# Patient Record
Sex: Male | Born: 1960 | Race: Black or African American | Hispanic: No | Smoking: Former smoker
Health system: Southern US, Community
[De-identification: ages and names within clinical notes are randomized; demographics above are authoritative.]

## PROBLEM LIST (undated history)

## (undated) ENCOUNTER — Ambulatory Visit: Payer: MEDICAID | Source: Home / Self Care

## (undated) DIAGNOSIS — F1010 Alcohol abuse, uncomplicated: Secondary | ICD-10-CM

## (undated) DIAGNOSIS — F1910 Other psychoactive substance abuse, uncomplicated: Secondary | ICD-10-CM

## (undated) DIAGNOSIS — T7840XA Allergy, unspecified, initial encounter: Secondary | ICD-10-CM

## (undated) DIAGNOSIS — M109 Gout, unspecified: Secondary | ICD-10-CM

## (undated) DIAGNOSIS — K219 Gastro-esophageal reflux disease without esophagitis: Secondary | ICD-10-CM

## (undated) DIAGNOSIS — F419 Anxiety disorder, unspecified: Secondary | ICD-10-CM

## (undated) DIAGNOSIS — I10 Essential (primary) hypertension: Secondary | ICD-10-CM

## (undated) DIAGNOSIS — Z87442 Personal history of urinary calculi: Secondary | ICD-10-CM

## (undated) DIAGNOSIS — K859 Acute pancreatitis without necrosis or infection, unspecified: Secondary | ICD-10-CM

## (undated) DIAGNOSIS — T83122A Displacement of urinary stent, initial encounter: Secondary | ICD-10-CM

## (undated) DIAGNOSIS — E785 Hyperlipidemia, unspecified: Secondary | ICD-10-CM

## (undated) DIAGNOSIS — M1990 Unspecified osteoarthritis, unspecified site: Secondary | ICD-10-CM

## (undated) DIAGNOSIS — G4730 Sleep apnea, unspecified: Secondary | ICD-10-CM

## (undated) DIAGNOSIS — R519 Headache, unspecified: Secondary | ICD-10-CM

## (undated) DIAGNOSIS — N200 Calculus of kidney: Secondary | ICD-10-CM

## (undated) DIAGNOSIS — K5730 Diverticulosis of large intestine without perforation or abscess without bleeding: Secondary | ICD-10-CM

## (undated) HISTORY — DX: Gout, unspecified: M10.9

## (undated) HISTORY — DX: Displacement of indwelling ureteral stent, initial encounter: T83.122A

## (undated) HISTORY — DX: Hyperlipidemia, unspecified: E78.5

## (undated) HISTORY — DX: Allergy, unspecified, initial encounter: T78.40XA

## (undated) HISTORY — PX: URETEROSCOPY: SHX842

## (undated) HISTORY — DX: Diverticulosis of large intestine without perforation or abscess without bleeding: K57.30

## (undated) HISTORY — DX: Essential (primary) hypertension: I10

## (undated) HISTORY — DX: Calculus of kidney: N20.0

## (undated) HISTORY — DX: Alcohol abuse, uncomplicated: F10.10

## (undated) HISTORY — DX: Gastro-esophageal reflux disease without esophagitis: K21.9

## (undated) HISTORY — PX: OTHER SURGICAL HISTORY: SHX169

## (undated) HISTORY — DX: Unspecified osteoarthritis, unspecified site: M19.90

## (undated) HISTORY — PX: ANTERIOR CRUCIATE LIGAMENT REPAIR: SHX115

## (undated) HISTORY — DX: Acute pancreatitis without necrosis or infection, unspecified: K85.90

---

## 1988-05-07 DIAGNOSIS — T83122A Displacement of urinary stent, initial encounter: Secondary | ICD-10-CM

## 1988-05-07 HISTORY — DX: Displacement of indwelling ureteral stent, initial encounter: T83.122A

## 2004-08-26 ENCOUNTER — Emergency Department (HOSPITAL_COMMUNITY): Admission: EM | Admit: 2004-08-26 | Discharge: 2004-08-26 | Payer: Self-pay | Admitting: *Deleted

## 2004-08-29 ENCOUNTER — Ambulatory Visit (HOSPITAL_COMMUNITY): Admission: RE | Admit: 2004-08-29 | Discharge: 2004-08-29 | Payer: Self-pay | Admitting: Orthopedic Surgery

## 2004-08-31 ENCOUNTER — Ambulatory Visit (HOSPITAL_COMMUNITY): Admission: RE | Admit: 2004-08-31 | Discharge: 2004-09-01 | Payer: Self-pay | Admitting: Orthopedic Surgery

## 2005-07-16 ENCOUNTER — Ambulatory Visit: Payer: Self-pay | Admitting: Internal Medicine

## 2005-07-31 ENCOUNTER — Ambulatory Visit: Payer: Self-pay | Admitting: Internal Medicine

## 2006-05-07 HISTORY — PX: KNEE SURGERY: SHX244

## 2007-01-20 ENCOUNTER — Ambulatory Visit: Payer: Self-pay | Admitting: Internal Medicine

## 2007-01-20 LAB — CONVERTED CEMR LAB
BUN: 10 mg/dL (ref 6–23)
CO2: 33 meq/L — ABNORMAL HIGH (ref 19–32)
Chloride: 110 meq/L (ref 96–112)
GFR calc Af Amer: 118 mL/min
GFR calc non Af Amer: 97 mL/min
Potassium: 3.9 meq/L (ref 3.5–5.1)

## 2007-01-28 ENCOUNTER — Encounter: Payer: Self-pay | Admitting: Internal Medicine

## 2007-01-28 LAB — CONVERTED CEMR LAB
Catecholamines Tot(E+NE) 24 Hr U: 0.051 mg/24hr
Dopamine 24 Hr Urine: 239 mcg/24hr (ref <500)
Epinephrine 24 Hr Urine: 3 mcg/24hr (ref <20)
Metaneph Total, Ur: 439 ug/24hr (ref 90–690)
Metanephrines, Ur: 127 (ref 26–230)
Norepinephrine 24 Hr Urine: 48 mcg/24hr (ref <80)
Normetanephrine, 24H Ur: 312 (ref 44–540)

## 2007-05-12 ENCOUNTER — Encounter: Payer: Self-pay | Admitting: Internal Medicine

## 2007-05-12 ENCOUNTER — Ambulatory Visit: Payer: Self-pay | Admitting: Internal Medicine

## 2007-05-12 DIAGNOSIS — Z87442 Personal history of urinary calculi: Secondary | ICD-10-CM

## 2007-05-12 DIAGNOSIS — M6530 Trigger finger, unspecified finger: Secondary | ICD-10-CM | POA: Insufficient documentation

## 2007-05-14 ENCOUNTER — Telehealth: Payer: Self-pay | Admitting: Internal Medicine

## 2007-08-07 ENCOUNTER — Ambulatory Visit: Payer: Self-pay | Admitting: Internal Medicine

## 2007-08-08 ENCOUNTER — Telehealth: Payer: Self-pay | Admitting: Internal Medicine

## 2007-08-08 ENCOUNTER — Ambulatory Visit: Payer: Self-pay | Admitting: Internal Medicine

## 2007-08-08 LAB — CONVERTED CEMR LAB
BUN: 6 mg/dL (ref 6–23)
CO2: 28 meq/L (ref 19–32)
Chloride: 106 meq/L (ref 96–112)
Creatinine, Ser: 1 mg/dL (ref 0.4–1.5)
Crystals: NEGATIVE
GFR calc non Af Amer: 86 mL/min
Hemoglobin, Urine: NEGATIVE
Ketones, ur: NEGATIVE mg/dL
Potassium: 3.5 meq/L (ref 3.5–5.1)
Sodium: 141 meq/L (ref 135–145)
Specific Gravity, Urine: 1.015 (ref 1.000–1.03)
Total Protein, Urine: NEGATIVE mg/dL
Urine Glucose: NEGATIVE mg/dL
Urobilinogen, UA: 0.2 (ref 0.0–1.0)
pH: 5.5 (ref 5.0–8.0)

## 2007-08-09 ENCOUNTER — Telehealth: Payer: Self-pay | Admitting: Internal Medicine

## 2007-10-26 ENCOUNTER — Encounter: Payer: Self-pay | Admitting: Internal Medicine

## 2007-10-26 ENCOUNTER — Ambulatory Visit: Payer: Self-pay | Admitting: Internal Medicine

## 2007-10-26 ENCOUNTER — Inpatient Hospital Stay (HOSPITAL_COMMUNITY): Admission: EM | Admit: 2007-10-26 | Discharge: 2007-11-08 | Payer: Self-pay | Admitting: Emergency Medicine

## 2007-10-26 DIAGNOSIS — F1010 Alcohol abuse, uncomplicated: Secondary | ICD-10-CM | POA: Insufficient documentation

## 2007-11-06 ENCOUNTER — Telehealth: Payer: Self-pay | Admitting: Internal Medicine

## 2007-11-10 ENCOUNTER — Telehealth: Payer: Self-pay | Admitting: Internal Medicine

## 2007-11-12 ENCOUNTER — Ambulatory Visit: Payer: Self-pay | Admitting: Internal Medicine

## 2007-11-13 ENCOUNTER — Ambulatory Visit: Payer: Self-pay | Admitting: Internal Medicine

## 2007-11-13 ENCOUNTER — Encounter (INDEPENDENT_AMBULATORY_CARE_PROVIDER_SITE_OTHER): Payer: Self-pay | Admitting: *Deleted

## 2007-11-13 DIAGNOSIS — Z8719 Personal history of other diseases of the digestive system: Secondary | ICD-10-CM | POA: Insufficient documentation

## 2007-11-14 ENCOUNTER — Telehealth: Payer: Self-pay | Admitting: Internal Medicine

## 2007-11-17 ENCOUNTER — Telehealth: Payer: Self-pay | Admitting: Internal Medicine

## 2007-11-18 ENCOUNTER — Telehealth: Payer: Self-pay | Admitting: Internal Medicine

## 2007-12-04 ENCOUNTER — Telehealth: Payer: Self-pay | Admitting: Internal Medicine

## 2008-01-01 ENCOUNTER — Telehealth: Payer: Self-pay | Admitting: Internal Medicine

## 2008-01-14 ENCOUNTER — Ambulatory Visit: Payer: Self-pay | Admitting: Internal Medicine

## 2008-01-15 ENCOUNTER — Ambulatory Visit: Payer: Self-pay | Admitting: Cardiology

## 2008-01-16 ENCOUNTER — Telehealth: Payer: Self-pay | Admitting: Internal Medicine

## 2008-01-26 ENCOUNTER — Telehealth: Payer: Self-pay | Admitting: Internal Medicine

## 2008-04-07 ENCOUNTER — Telehealth (INDEPENDENT_AMBULATORY_CARE_PROVIDER_SITE_OTHER): Payer: Self-pay | Admitting: *Deleted

## 2008-04-12 ENCOUNTER — Encounter: Payer: Self-pay | Admitting: Internal Medicine

## 2008-04-14 ENCOUNTER — Ambulatory Visit: Payer: Self-pay | Admitting: Internal Medicine

## 2008-04-14 DIAGNOSIS — I10 Essential (primary) hypertension: Secondary | ICD-10-CM

## 2008-04-21 ENCOUNTER — Telehealth: Payer: Self-pay | Admitting: Internal Medicine

## 2008-04-26 ENCOUNTER — Ambulatory Visit: Payer: Self-pay | Admitting: Internal Medicine

## 2008-06-24 ENCOUNTER — Telehealth: Payer: Self-pay | Admitting: Internal Medicine

## 2008-07-08 ENCOUNTER — Telehealth: Payer: Self-pay | Admitting: Internal Medicine

## 2008-07-13 ENCOUNTER — Telehealth: Payer: Self-pay | Admitting: Internal Medicine

## 2008-08-10 ENCOUNTER — Ambulatory Visit: Payer: Self-pay | Admitting: Internal Medicine

## 2008-08-10 DIAGNOSIS — K219 Gastro-esophageal reflux disease without esophagitis: Secondary | ICD-10-CM

## 2009-01-19 ENCOUNTER — Encounter (INDEPENDENT_AMBULATORY_CARE_PROVIDER_SITE_OTHER): Payer: Self-pay | Admitting: *Deleted

## 2009-02-18 ENCOUNTER — Ambulatory Visit: Payer: Self-pay | Admitting: Internal Medicine

## 2009-02-22 ENCOUNTER — Telehealth: Payer: Self-pay | Admitting: Internal Medicine

## 2009-03-12 ENCOUNTER — Telehealth (INDEPENDENT_AMBULATORY_CARE_PROVIDER_SITE_OTHER): Payer: Self-pay | Admitting: *Deleted

## 2009-04-18 ENCOUNTER — Telehealth: Payer: Self-pay | Admitting: Internal Medicine

## 2009-04-19 ENCOUNTER — Ambulatory Visit: Payer: Self-pay | Admitting: Internal Medicine

## 2009-04-19 ENCOUNTER — Encounter (INDEPENDENT_AMBULATORY_CARE_PROVIDER_SITE_OTHER): Payer: Self-pay | Admitting: *Deleted

## 2009-08-01 ENCOUNTER — Telehealth: Payer: Self-pay | Admitting: Internal Medicine

## 2009-09-27 ENCOUNTER — Telehealth (INDEPENDENT_AMBULATORY_CARE_PROVIDER_SITE_OTHER): Payer: Self-pay | Admitting: *Deleted

## 2010-06-05 ENCOUNTER — Ambulatory Visit: Admit: 2010-06-05 | Payer: Self-pay | Admitting: Internal Medicine

## 2010-06-06 NOTE — Progress Notes (Signed)
Summary: Beta-prostate supplement  Phone Note Call from Patient Call back at [facility] Phone [phone]   Summary of Call: Pt is thinking about trying a new product, betaprostate, from [facility] store. Is this ok to try given current medications?  Initial call taken by: Lamar Sprinkles, CMA,  August 01, 2009 4:33 PM  Follow-up for Phone Call        per google search this is super potent saw palmeto and should be OK Follow-up by: Jacques Navy MD,  August 01, 2009 6:21 PM  Additional Follow-up for Phone Call Additional follow up Details #1::        Pt informed  Additional Follow-up by: Lamar Sprinkles, CMA,  August 01, 2009 6:25 PM

## 2010-06-06 NOTE — Progress Notes (Signed)
Summary: Schedule recall office visit  Phone Note Outgoing Call Call back at [facility] Phone [phone]   Call placed by: Christie Nottingham CMA Duncan Dull),  Sep 27, 2009 11:07 AM Call placed to: Patient Summary of Call: left message for pt  to call back and schedule recall office visit.  Initial call taken by: Christie Nottingham CMA Duncan Dull),  Sep 27, 2009 11:08 AM  Follow-up for Phone Call        Pt refused to schedule at this time and states he will call back to schedule office visit.  Follow-up by: Christie Nottingham CMA Duncan Dull),  October 05, 2009 10:38 AM

## 2010-06-08 ENCOUNTER — Other Ambulatory Visit: Payer: Self-pay | Admitting: Internal Medicine

## 2010-06-08 ENCOUNTER — Encounter (INDEPENDENT_AMBULATORY_CARE_PROVIDER_SITE_OTHER): Payer: Self-pay | Admitting: *Deleted

## 2010-06-08 ENCOUNTER — Other Ambulatory Visit: Payer: Self-pay

## 2010-06-08 DIAGNOSIS — E785 Hyperlipidemia, unspecified: Secondary | ICD-10-CM

## 2010-06-08 DIAGNOSIS — Z0000 Encounter for general adult medical examination without abnormal findings: Secondary | ICD-10-CM

## 2010-06-08 LAB — LIPID PANEL
Total CHOL/HDL Ratio: 3
Triglycerides: 186 mg/dL — ABNORMAL HIGH (ref 0.0–149.0)
VLDL: 37.2 mg/dL (ref 0.0–40.0)

## 2010-06-08 LAB — CBC WITH DIFFERENTIAL/PLATELET
Basophils Absolute: 0 10*3/uL (ref 0.0–0.1)
Basophils Relative: 0.5 % (ref 0.0–3.0)
Eosinophils Absolute: 0.1 10*3/uL (ref 0.0–0.7)
Eosinophils Relative: 0.9 % (ref 0.0–5.0)
HCT: 40.7 % (ref 39.0–52.0)
Lymphocytes Relative: 33.2 % (ref 12.0–46.0)
Monocytes Absolute: 0.8 10*3/uL (ref 0.1–1.0)
Monocytes Relative: 8.9 % (ref 3.0–12.0)
Neutro Abs: 5.2 10*3/uL (ref 1.4–7.7)

## 2010-06-08 LAB — BASIC METABOLIC PANEL
CO2: 27 mEq/L (ref 19–32)
Calcium: 9.3 mg/dL (ref 8.4–10.5)
Chloride: 101 mEq/L (ref 96–112)
Creatinine, Ser: 1.1 mg/dL (ref 0.4–1.5)
GFR: 91.75 mL/min (ref >60.00)
Glucose, Bld: 112 mg/dL — ABNORMAL HIGH (ref 70–99)
Potassium: 3.8 mEq/L (ref 3.5–5.1)

## 2010-06-08 LAB — URINALYSIS, ROUTINE W REFLEX MICROSCOPIC
Bilirubin Urine: NEGATIVE
Ketones, ur: NEGATIVE
Leukocytes, UA: NEGATIVE
Nitrite: NEGATIVE
Specific Gravity, Urine: >=1.03 (ref 1.000–1.030)
Total Protein, Urine: NEGATIVE
Urine Glucose: NEGATIVE
Urobilinogen, UA: 0.2 (ref 0.0–1.0)

## 2010-06-08 LAB — TSH: TSH: 0.9 u[IU]/mL (ref 0.35–5.50)

## 2010-06-08 LAB — HEPATIC FUNCTION PANEL
Alkaline Phosphatase: 30 U/L — ABNORMAL LOW (ref 39–117)
Bilirubin, Direct: 0.2 mg/dL (ref 0.0–0.3)
Total Bilirubin: 1.1 mg/dL (ref 0.3–1.2)

## 2010-06-08 LAB — LDL CHOLESTEROL, DIRECT: Direct LDL: 143.6 mg/dL

## 2010-06-08 LAB — PSA: PSA: 0.5 ng/mL (ref 0.10–4.00)

## 2010-06-09 ENCOUNTER — Encounter: Payer: Self-pay | Admitting: Internal Medicine

## 2010-06-09 ENCOUNTER — Encounter (INDEPENDENT_AMBULATORY_CARE_PROVIDER_SITE_OTHER): Payer: BC Managed Care – PPO | Admitting: Internal Medicine

## 2010-06-09 DIAGNOSIS — Z0000 Encounter for general adult medical examination without abnormal findings: Secondary | ICD-10-CM

## 2010-06-22 NOTE — Assessment & Plan Note (Signed)
Summary: cpx/#/bcbs/cd   Vital Signs:  Patient profile:   50 year old male Height:      71 inches Weight:      230 pounds BMI:     32.19 O2 Sat:      97 % on Room air Temp:     98.1 degrees F oral Pulse rate:   72 / minute BP sitting:   120 / 88  (left arm) Cuff size:   large  Vitals Entered By: Bill Salinas CMA (June 09, 2010 2:49 PM)  O2 Flow:  Room air CC: cpx/ ab  Vision Screening:      Vision Comments: Eye Exam due   Primary Care Provider:  Illene Regulus MD  CC:  cpx/ ab.  History of Present Illness: Mr. Cumpston presents for an annual physical exam. He has been doing well. His chief complaint is a tendon nodule base of 3rd finger left hand. He is otherwise doing well. Over the past yearhe has gained some weight. He is aware of this and recogizes the importance of working to keep his weight under good control.   In the interval her has had no GI problems: no abdominal pain, food intolerance or change in bowel habit.   Current Medications (verified): 1)  Lotrel 5-20 Mg  Caps (Amlodipine Besy-Benazepril Hcl) .... Take 1 Tablet By Mouth Once A Day 2)  Furosemide 20 Mg  Tabs (Furosemide) .[name].. 1 By Mouth Once Daily 3)  Excedrin Extra Strength 250-250-65 Mg Tabs (Aspirin-Acetaminophen-Caffeine) .... One Tablet By Mouth As Needed 4)  Ranitidine Hcl 150 Mg Caps (Ranitidine Hcl) .[name].. 1 By Mouth Two Times A Day. For Stomach Acid As Needed 5)  Amrix 15 Mg Xr24h-Cap (Cyclobenzaprine Hcl) .... One By Mouth Once Daily As Needed For Scalp Spasms  Allergies (verified): No Known Drug Allergies  Past History:  Past Medical History: Last updated: 01/14/2008 * STENT PLACEMENT IN 1990-ureteral * URETEROSCOPY NEPHROLITHIASIS, HX OF (ICD-V13.01) HYPERTENSION (ICD-401.9) GC medically treated in the past Pancreatitis, hx of Hyperlipidemia Kidney Stones  Past Surgical History: Last updated: 04/14/2008 ureteroscopy and stone retrieval  '90 A2-pulley flexor sheath cyst 4th  finger-excisional biopsy ([facility]) '03  Family History: Last updated: 05/12/2007 Pos- HTN, breast cancer, EtOH abuse Neg - CAD, DM, prostate or colon cancer  Social History: [employer] ground[employer]. Difficult domestic situation: married  '87-seperated '01 2 sons - ''82, '91; 1 daughter '91 Alcohol use-yes His niece is 72 with metastatic breast cancer (Feb '12)  Review of Systems  The patient denies anorexia, fever, weight gain, vision loss, hoarseness, chest pain, dyspnea on exertion, prolonged cough, abdominal pain, melena, hematochezia, severe indigestion/heartburn, muscle weakness, suspicious skin lesions, depression, unusual weight change, enlarged lymph nodes, and angioedema.    Physical Exam  General:  alert, well-developed, well-nourished, well-hydrated, normal appearance, and healthy-appearing.   Head:  Normocephalic and atraumatic without obvious abnormalities. No apparent alopecia or balding. Eyes:  vision grossly intact, pupils equal, pupils round, corneas and lenses clear, no injection, no optic disk abnormalities, and no retinal abnormalitiies.   Ears:  External ear exam shows no significant lesions or deformities.  Otoscopic examination reveals clear canals, tympanic membranes are intact bilaterally without bulging, retraction, inflammation or discharge. Hearing is grossly normal bilaterally. Nose:  no external deformity, no external erythema, and no nasal discharge.   Mouth:  Oral mucosa and oropharynx without lesions or exudates.  Teeth in good repair. Neck:  supple, full ROM, no thyromegaly, and no carotid bruits.   Chest Wall:  No deformities, masses, tenderness or gynecomastia noted. Lungs:  Normal respiratory effort, chest expands symmetrically. Lungs are clear to auscultation, no crackles or wheezes. Heart:  Normal rate and regular rhythm. S1 and S2 normal without gallop, murmur, click, rub or other extra sounds. Abdomen:  soft, non-tender, normal bowel sounds, no  guarding, no rigidity, and no hepatomegaly.   Prostate:  deferred to PSA Msk:  normal ROM, no joint tenderness, no joint swelling, no joint warmth, and no joint instability.   Pulses:  2+ radial and PT ulses Extremities:  No clubbing, cyanosis, edema, or deformity noted with normal full range of motion of all joints.   Neurologic:  alert & oriented X3, cranial nerves II-XII intact, gait normal, and DTRs symmetrical and normal.   Skin:  turgor normal, color normal, no rashes, no suspicious lesions, and no ulcerations.   Cervical Nodes:  no anterior cervical adenopathy and no posterior cervical adenopathy.   Inguinal Nodes:  no R inguinal adenopathy and no L inguinal adenopathy.   Psych:  Oriented X3, memory intact for recent and remote, normally interactive, and good eye contact.     Impression & Recommendations:  Problem # 1:  GERD (ICD-530.81) Well controlled on H2 blocker therapy.  His updated medication list for this problem includes:    Ranitidine Hcl 150 Mg Caps (Ranitidine hcl) .[name]... 1 by mouth two times a day. for stomach acid as needed  Problem # 2:  HYPERTENSION (ICD-401.9) His updated medication list for this problem includes:    Lotrel 5-20 Mg Caps (Amlodipine besy-benazepril hcl) .[name]... Take 1 tablet by mouth once a day    Furosemide 20 Mg Tabs (Furosemide) .[name]... 1 by mouth once daily  BP today: 120/88 Prior BP: 134/84 (04/19/2009)  Good control on present medications. Lab values - normal: no electrolyte imabalance, renal function normal.  Problem # 3:  Preventive Health Care (ICD-V70.0) Interval history has been unremarkable. Physical exam is normal. Lab results are within normal limits except for LDL cholesterol that is above goal of 130 or less and minimal elevation in blood sugar. Weight management - discussed this as a problem that wil need attention: smart food choices ( the local calorie choice); PORTION SIZE CONTROL - one plate, no seconds, snack in a bowel and never  form the can or bag; regular aerobic exercise.  Target weight - 200 lbs; goal is to loose 1-2 lbs per month = 15 - 30 month project!!. Prostate cancer screening - PSA normal. Will need colorectal cancer screenings at age 34. Immunizations - last tetnus on record is '96, due for tetnus.  In summary - a very nice man who appears to be medically stable. He is counseled to reduce the fat in his diet to bring his LDL cholesterol to goal and to try to eliminate sugar from his diet, especially sugared beverages. He will return as needed or in 1 year.   Complete Medication List: 1)  Lotrel 5-20 Mg Caps (Amlodipine besy-benazepril hcl) .... Take 1 tablet by mouth once a day 2)  Furosemide 20 Mg Tabs (Furosemide) .[name].. 1 by mouth once daily 3)  Excedrin Extra Strength 250-250-65 Mg Tabs (Aspirin-acetaminophen-caffeine) .... One tablet by mouth as needed 4)  Ranitidine Hcl 150 Mg Caps (Ranitidine hcl) .[name].. 1 by mouth two times a day. for stomach acid as needed 5)  Amrix 15 Mg Xr24h-cap (Cyclobenzaprine hcl) .... One by mouth once daily as needed for scalp spasms   Patient: [name] Note: All result statuses are  Final unless otherwise noted.  Tests: (1) Lipid Panel (LIPID)   Cholesterol          [H]  219 mg/dL                   1-610     ATP III Classification            Desirable:  < 200 mg/dL                    Borderline High:  200 - 239 mg/dL               High:  > = 240 mg/dL   Triglycerides        [H]  186.0 mg/dL                 9.6-045.4     Normal:  <150 mg/dL     Borderline High:  098 - 199 mg/dL   HDL                       11.91 mg/dL                 >47.82   VLDL Cholesterol          37.2 mg/dL                  9.5-62.1  CHO/HDL Ratio:  CHD Risk                             3                    Men          Women     1/2 Average Risk     3.4          3.3     Average Risk          5.0          4.4     2X Average Risk          9.6          7.1     3X Average Risk          15.0           11.0                           Tests: (2) BMP (METABOL)   Sodium                    136 mEq/L                   135-145   Potassium                 3.8 mEq/L                   3.5-5.1   Chloride                  101 mEq/L                   96-112   Carbon Dioxide            27 mEq/L                    19-32  Glucose              [H]  112 mg/dL                   47-82   BUN                       17 mg/dL                    9-56   Creatinine                1.1 mg/dL                   2.1-3.0   Calcium                   9.3 mg/dL                   8.6-57.8   GFR                       91.75 mL/min                >60.00  Tests: (3) CBC Platelet w/Diff (CBCD)   White Cell Count          9.2 K/uL                    4.5-10.5   Red Cell Count            4.33 Mil/uL                 4.22-5.81   Hemoglobin                13.9 g/dL                   46.9-62.9   Hematocrit                40.7 %                      39.0-52.0   MCV                       94.0 fl                     78.0-100.0   MCHC                      34.2 g/dL                   52.8-41.3   RDW                       13.4 %                      11.5-14.6   Platelet Count            239.0 K/uL                  150.0-400.0   Neutrophil %              56.5 %                      43.0-77.0   Lymphocyte %  33.2 %                      12.0-46.0   Monocyte %                8.9 %                       3.0-12.0   Eosinophils%              0.9 %                       0.0-5.0   Basophils %               0.5 %                       0.0-3.0   Neutrophill Absolute      5.2 K/uL                    1.4-7.7   Lymphocyte Absolute       3.1 K/uL                    0.7-4.0   Monocyte Absolute         0.8 K/uL                    0.1-1.0  Eosinophils, Absolute                             0.1 K/uL                    0.0-0.7   Basophils Absolute        0.0 K/uL                    0.0-0.1  Tests: (4) Hepatic/Liver Function Panel (HEPATIC)    Total Bilirubin           1.1 mg/dL                   9.8-1.1   Direct Bilirubin          0.2 mg/dL                   9.1-4.7   Alkaline Phosphatase [L]  30 U/L                      39-117   AST                       29 U/L                      0-37   ALT                       29 U/L                      0-53   Total Protein             7.6 g/dL                    8.2-9.5   Albumin                   4.6  g/dL                    0.4-5.4  Tests: (5) TSH (TSH)   FastTSH                   0.90 uIU/mL                 0.35-5.50  Tests: (6) Prostate Specific Antigen (PSA)   PSA-Hyb                   0.50 ng/mL                  0.10-4.00  Tests: (7) UDip w/Micro (URINE)   Color                     YELLOW       RANGE:  Yellow;Lt. Yellow   Clarity                   CLEAR                       Clear   Specific Gravity          >=1.030                     1.000 - 1.030   Urine Ph                  5.0                         5.0-8.0   Protein                   NEGATIVE                    Negative   Urine Glucose             NEGATIVE                    Negative   Ketones                   NEGATIVE                    Negative   Urine Bilirubin           NEGATIVE                    Negative   Blood                     MODERATE                    Negative   Urobilinogen              0.2                         0.0 - 1.0   Leukocyte Esterace        NEGATIVE                    Negative   Nitrite                   NEGATIVE  Negative   Urine Epith               Rare(0-4/hpf)               Rare(0-4/hpf)   Uric Acid Crystals        Presence of                 None  Tests: (8) Cholesterol LDL - Direct (DIRLDL)  Cholesterol LDL - Direct                             143.6 mg/dL     Optimal:  <627 mg/dL     Near or Above Optimal:  100-129 mg/dL     Borderline High:  035-009 mg/dL     High:  381-829 mg/dL     Very High:  >937 mg/dLPrescriptions: RANITIDINE HCL 150 MG CAPS (RANITIDINE HCL)  1 by mouth two times a day. For stomach acid as needed  #60 Capsule x 12   Entered and Authorized by:   Jacques Navy MD   Signed by:   Jacques Navy MD on 06/09/2010   Method used:   Electronically to        [facility]. [phone]* (retail)       [address]       [place], [place]  [address]       Ph: [phone] or [phone]       Fax: [phone]   RxID:   [id number] FUROSEMIDE 20 MG  TABS (FUROSEMIDE) 1 by mouth once daily  #30 Tablet x 12   Entered and Authorized by:   Jacques Navy MD   Signed by:   Jacques Navy MD on 06/09/2010   Method used:   [facility] to        [facility] [phone]* (retail)       [address] [place]  [address]       Ph: [phone] or [phone]       Fax: [phone]   RxID:   [id number] LOTREL 5-20 MG  CAPS (AMLODIPINE BESY-BENAZEPRIL HCL) Take 1 tablet by mouth once a day  #30 Capsule x 12   Entered and Authorized by:   Jacques Navy MD   Signed by:   Jacques Navy MD on 06/09/2010   Method used:   [facility] to        [facility]. [phone]* (retail)       [address], [place]  [address]       Ph: [phone] or [phone]       Fax: [phone]   RxID:   [id number]    Orders Added: 1)  Est. Patient 40-64 years [phone]

## 2010-08-26 ENCOUNTER — Emergency Department (HOSPITAL_COMMUNITY)
Admission: EM | Admit: 2010-08-26 | Discharge: 2010-08-27 | Disposition: A | Discharge status: Home / Self Care | Payer: BC Managed Care – PPO | Attending: Emergency Medicine | Admitting: Emergency Medicine

## 2010-08-26 DIAGNOSIS — I10 Essential (primary) hypertension: Secondary | ICD-10-CM | POA: Insufficient documentation

## 2010-08-26 DIAGNOSIS — Z87898 Personal history of other specified conditions: Secondary | ICD-10-CM | POA: Insufficient documentation

## 2010-08-26 DIAGNOSIS — Z87442 Personal history of urinary calculi: Secondary | ICD-10-CM | POA: Insufficient documentation

## 2010-08-26 DIAGNOSIS — R109 Unspecified abdominal pain: Secondary | ICD-10-CM | POA: Insufficient documentation

## 2010-08-26 DIAGNOSIS — N200 Calculus of kidney: Secondary | ICD-10-CM | POA: Insufficient documentation

## 2010-08-26 DIAGNOSIS — R112 Nausea with vomiting, unspecified: Secondary | ICD-10-CM | POA: Insufficient documentation

## 2010-08-26 LAB — URINALYSIS, ROUTINE W REFLEX MICROSCOPIC
Bilirubin Urine: NEGATIVE
Glucose, UA: NEGATIVE mg/dL
Ketones, ur: NEGATIVE mg/dL
Leukocytes, UA: NEGATIVE
pH: 5.5 (ref 5.0–8.0)

## 2010-08-26 LAB — URINE MICROSCOPIC-ADD ON

## 2010-08-27 ENCOUNTER — Emergency Department (HOSPITAL_COMMUNITY): Payer: BC Managed Care – PPO

## 2010-09-19 NOTE — Assessment & Plan Note (Signed)
[facility]                           PRIMARY CARE OFFICE NOTE   CHUKWUKA, FESTA                        MRN:          [id number]  DATE:01/20/2007                            DOB:          12-30-1960    [name] is a 50 year old African-American gentleman who is seen  acutely.  He reports that, during the last week or so, he has had  episodes of headache, light-headedness, and when his blood pressure has  been checked either by nurse at work or at his local drug store, it has  been significantly elevated in the 195/120 range.  The patient does  admit to having headache and flushing with activity.  He has been having  some left chest discomfort, burning in nature, not necessarily exertion  related.  However, on questioning again, he did admit to having some  exertion-related chest pain and flushing.  He, on questioning, did admit  to having a adrenalized-type feeling with a funny feeling in the pit  of his stomach and a flight/fight mode.   The patient's last office visit dated July 31, 2005.  Aside from  today's complaints, was doing well in the interval.  He has been taking  his blood pressure medication on a regular basis.   PAST MEDICAL HISTORY:   SURGICAL:  The patient had nephrolithiasis requiring surgical  intervention and ureteroscopy and stent placement in 1990.  No other  surgeries are noted.   MEDICAL:  1. The patient had usual childhood disease.  2. GC treated medically.  3. Hypertension for at least the last 12 years.   CURRENT MEDICATIONS:  Lotrel 5/20 once daily.   HABITS:  The patient quit smoking in 2001.  Alcohol use, the patient  quit drinking in 2001.   FAMILY HISTORY:  Positive for hypertension.  Positive for breast cancer.  Positive for alcohol abuse.  Positive for stroke in second degree  kinship.  History is negative for cardiovascular disease, diabetes,  prostate cancer, or colon cancer.   SOCIAL HISTORY:  The  patient is a high Garment/textile technologist.  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  He has a difficult  domestic situation by his report with the usual amount of __________  and disagreement.   REVIEW OF SYSTEMS:  Negative for any fevers, sweats, chills, or other  constitutional symptoms.  No ophthalm, ENT, respiratory problems.  Cardiovascular and GI as above.   EXAMINATION:  Temperature 99.2, blood pressure 145/92, pulse 78, weight  221.  GENERAL APPEARANCE:  This is a heavyset overweight African-American  gentleman in no acute distress.  CHEST:  The patient is moving air well with no rales, wheezes, or  rhonchi.  CARDIOVASCULAR:  2+ radial pulse.  He had a quiet precordium.  He had a  regular rate and rhythm without murmurs, rubs, or gallops.  ABDOMEN:  Protuberant, soft, with positive bowel sounds.  Minimal  tenderness in the epigastrium and left upper quadrant.   A 12-lead echocardiogram revealed the patient to have a normal sinus  rhythm.  There were no ST-T  wave changes.  No signs of acute injury.  No  signs of old injury.   ASSESSMENT AND PLAN:  1. Cardiovascular.  The patient with atypical symptoms for coronary      artery disease/angina.  In reviewing his chart, he did have a      stress test at [facility] March 18, 2001, which was a negative study.      The patient's EKG, as noted, is unremarkable.  Index of suspicion      for active ischemic heart disease is low.  2. Endocrine.  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  Plan:      A 24 hour urinalysis for catecholamines and metanephrine.  3. Hypertension.  The patient's blood pressure in the office today is      mildly elevated, as it was in March of 2007.  Plan:  Will add Lasix      20 mg to  the patient's regimen.  He will continue with Lotrel 5/20.   SUMMARY:  The patient with paroxysmal elevation in blood pressure with  associated symptoms as noted.  A 24 hour urine study is ordered and  pending.  The patient is carefully instructed to notify me should he  have any progressive symptoms or discomfort.     Rosalyn Gess Norins, MD  Electronically Signed    MEN/MedQ  DD: 01/21/2007  DT: 01/21/2007  Job #: [id number]   cc:   Dolphus Jenny

## 2010-09-22 NOTE — Op Note (Signed)
[name], [name]                 ACCOUNT NO.:  [id number]   MEDICAL RECORD NO.:  [id number]          PATIENT TYPE:  OIB   LOCATION:  2550                         FACILITY:  [facility]   PHYSICIAN:  Doralee Albino. Carola Frost, M.D. DATE OF BIRTH:  [birth year]   DATE OF PROCEDURE:  08/31/2004  DATE OF DISCHARGE:                                 OPERATIVE REPORT   PREOPERATIVE DIAGNOSIS:  Right patellar tendon acute disruption.   POSTOPERATIVE DIAGNOSIS:  Right patellar tendon acute disruption.   PROCEDURE:  Acute repair of right patellar tendon rupture and retinaculum.   SURGEON:  Myrene Galas, M.D.   ASSISTANT:  Cecil Cranker, P.A.   ANESTHESIA:  General.   TOTAL TOURNIQUET TIME:  90 minutes.   ESTIMATED BLOOD LOSS:  70 cc of hematoma.   DRAINS:  None.   DISPOSITION:  To PACU.   CONDITION:  Stable.   BRIEF SUMMARY OF INDICATIONS FOR PROCEDURE:  [name] is a 50 year old black  male who sustained an acute injury to his right knee while playing  basketball.  He was unable to perform a straight leg raise and subsequent  physical examination and x-rays suggested patellar tendon disruption.  After  a discussion of the risks and benefits of surgery including the possibility  of loss of motion, failure of the tendon to heal, infection, and  neurovascular injury, the patient wished to proceed.  On the date of his  initial planned surgery of August 29, 2004, he was not able to undergo  surgery because of problems with scheduling in the main operating room and  the unavailability of rooms.  Consequently, he returned on August 31, 2004  for surgical fixation of his ruptured tendon.   BRIEF SUMMARY OF PROCEDURE:  [name] was administered preoperatively  antibiotics, taken to the operating room where general anesthesia was  induced.  His right lower extremity was prepped and draped in the usual  sterile fashion with the tourniquet about the thigh.  After using elevation  and an Esmarch  bandage to exsanguinate the extremity, the tourniquet was  inflated to 325 mmHg.  A standard midline incision was made from the  proximal aspect of the patella down to the medial border of the tubercle and  dissection carried sharply down where the paratenon layer was then incised  in line with the incision in order to facilitate repair following repair of  the tendon.  A 70 cc of hematoma were evacuated and then curet and sponges  along with irrigation used to remove adherent hematoma.  The retinaculum and  capsule was torn from the patellar tendon back to the medial and lateral  epicondyles.  The undersurface of the patella was examined as was the medial  femoral condyles and the anterior horns of the menisci.  No chondral lesions  were identified and no anterior tears were visualized either.  The patient  did not have any medial or lateral instability when examined at 30 degrees  as well.  The patellar tendon had essentially exploded, leaving one primary  distal stump in the mid portion of the  tendon off the tubercle and one  proximal lateral portion of tendon and a proximal medial portion of tendon  as well as a small lateral layer off the tubercle distally as well.  The  patella was then debrided along its distal edge back to roughened bone and a  large Beath pin passed through this coming out the proximal side of the  patella.  A #5 Fibrewire suture was used to capture the distal tendon using  a Krackow stitch and one end was brought out through the patella and then  the pin was once more passed more laterally and the other end of suture  brought out there.  The knee was brought into full extension and then the  tendon tied over top of the bone proximally underneath the paratenon. This  resulted in excellent approximation of the tendon to the abraded bone along  the inferior edge of the patella and provided a very stout repair.  A #2  Fibrewire was then taken through the portion of the  tendon off the distal  aspect of the patella laterally and in similar fashion using the large Beath  pin, this was attached to bone along the lateral edge of the tubercle and  tied over the medial aspect of the proximal tibia through a 5 mm incision.  This also provided for a good approximation of the tendon to bone along the  tubercle and a stout repair.  The additional frons of patellar tendon were  then debrided where severely damaged and otherwise folded over to encase the  entirety of the repair was done there.  This was done with 0 PDS and  additional figure-of-eight 0 PDS was then used in figure-of-eight fashion in  the central and superficial aspect of the tendon.  The retinaculum was then  repaired along the medial and lateral sides using figure-of-eight #1 Vicryl.  At the conclusion of this layer of repair, the knee was brought into 60  degrees of flexion without any evidence of loosening or difficulty holding  the tendon and patella well approximated.  The paratenon was then repaired  over the patellar tendon distally using a 2-0 Vicryl.  Similarly, the  retinaculum and paratenon layer was brought over the patella.  Inverted 2-0  Vicryl was used for the subcutaneous layer and staples for the skin.  Sterile gently compressive dressing was then applied as well as a knee  immobilizer.  The tourniquet was deflated and the patient taken to the PACU  in stable condition.   PROGNOSIS:  [name] sustained a severe injury to his right knee but we were  able to obtain a very solid repair of his tendon as well as the retinaculum  and it was stable over a large flexion arch.  He will be in a knee  immobilizer initially for pain control and then we will switch him to a  Bledsoe brace and allow him progressive range of motion, 30 degrees for the  first two weeks, 60 degrees for the second two weeks and 90 degrees for the last two weeks.  He will be allowed to weight bear as tolerated with the  leg  locked in extension, either using the drop arms on the hinge brace or the  knee immobilizer.  He will receive Lovenox for deep venous thrombosis  prophylaxis while in the hospital but once discharged to home, can take a  baby aspirin.  We do not expect him to have prolonged period of  immobilization.  MHH/MEDQ  D:  08/31/2004  T:  08/31/2004  Job:  [id number]

## 2010-09-22 NOTE — Discharge Summary (Signed)
[name], Fowler NO.:  [id number]   MEDICAL RECORD NO.:  [id number]          PATIENT TYPE:  INP   LOCATION:  1305                         FACILITY:  [facility]   PHYSICIAN:  Valerie A. Felicity Coyer, MDDATE OF BIRTH:  1960-05-15   DATE OF ADMISSION:  10/26/2007  DATE OF DISCHARGE:  11/08/2007                               DISCHARGE SUMMARY   PRIMARY CARE PHYSICIAN:  Rosalyn Gess. Norins, M.D.   DISCHARGE DIAGNOSES:  1. Severe alcoholic pancreatitis.  2. Anemia, likely dilutional.  3. History of alcohol abuse.   HISTORY OF PRESENT ILLNESS:  [name] is a 50 year old African American  male with past medical history of hypertension and ETOH abuse who  presented to [facility] emergency room on day of admission with sudden  onset of abdominal pain and vomiting.  The patient described the pain as  epigastric, a constant, sharp pain, tearing in quality.  He denied any  nausea, diarrhea, constipation or melena.  The patient did report he  typically drinks 4 beers per day with 1 mixed drink and 12 beers on the  day prior to admission.  Evaluation in the emergency room revealed rare  bowel sounds with abdominal tenderness, no rebound or guarding.  Lab  work showed mildly elevated transaminases with white blood cell count of  19.1.  A CT of the abdomen and pelvis was performed, revealing acute  pancreatitis.  The patient was admitted for further evaluation and  treatment.   PAST MEDICAL HISTORY:  1. Urethral stent placement in 1990.  2. Hypertension.  3. History of gonorrhea, treated remotely.  4. History of ETOH abuse.   CONSULTATIONS DURING THIS ADMISSION:  [facility].   COURSE OF HOSPITALIZATION:  Severe ETOH pancreatitis:  The patient  started on empiric Unasyn at the time of admission for low-grade fever  and increased white cell count.  Pain was treated with IV morphine and  Zofran; however, despite decreasing white cell count and no fever, the  patient continued with significant abdominal pain and inability to  tolerate p.o. with increased abdominal distention.  GI was asked to see  the patient in consultation on November 05, 2007.  Abdominal CT was repeated,  revealing multiple pseudocysts, gram-positive necrosis in the pancreatic  neck.  Despite CT findings, films were reviewed, and GI felt there was  no necrosis in the neck of the pancreas but instead an organized fluid  collection.  The patient was transitioned to a low-fat diet and  monitored clinically.  The patient was also started on Pancrease during  this admission.  At time of discharge, the patient still with mild-to-  moderate abdominal pain, requesting oral narcotic medication for home  use.  The patient instructed throughout hospitalization on importance of  ETOH cessation.  The patient verbalized understanding of risks of heavy  ETOH use and is considering AA followup as outpatient.   MEDICATIONS AT THE TIME OF DISCHARGE:  1. Dilaudid 2 mg tabs 1/2 to 1 tablet q.4 h p.r.n. pain.  2. Protonix 40 mg p.o. daily.  3. Pancrease 2 tablets t.i.d. with meals.  4. Lotrel 5/20 one tablet p.o. daily.  5. The patient instructed to stop taking Lasix.   PERTINENT LABORATORY WORK:  At the time of discharge, white cell count  13.1, platelet count 411, hemoglobin 10.3, hematocrit 30.8.  Lipase 62,  AST 49, ALT 56.  Alkaline phosphatase 58, total albumin 2.8.   DISPOSITION:  The patient felt medically stable for discharge home,  where he is to continue on a low-fat diet.  The patient is instructed to  follow up with Dr. Marina Goodell in 4 to 6 weeks.  The patient is also to call  his primary care physician, Dr. Illene Regulus, for followup appointment  in the next week.  The patient again instructed on no alcohol  consumption.      Martin Pen, NP      Raenette Rover. Felicity Coyer, MD  Electronically Signed    LE/MEDQ  D:  12/24/2007  T:  12/24/2007  Job:  [id number]   cc:   Rosalyn Gess. Norins, MD  [address]  [place]  [place] [address]   Wilhemina Bonito. Marina Goodell, MD  [address]  [place]  [place] [address]

## 2010-11-05 ENCOUNTER — Emergency Department (HOSPITAL_COMMUNITY)
Admission: EM | Admit: 2010-11-05 | Discharge: 2010-11-05 | Disposition: A | Discharge status: Home / Self Care | Payer: BC Managed Care – PPO | Attending: Emergency Medicine | Admitting: Emergency Medicine

## 2010-11-05 ENCOUNTER — Emergency Department (HOSPITAL_COMMUNITY): Payer: BC Managed Care – PPO

## 2010-11-05 DIAGNOSIS — N200 Calculus of kidney: Secondary | ICD-10-CM | POA: Insufficient documentation

## 2010-11-05 DIAGNOSIS — R109 Unspecified abdominal pain: Secondary | ICD-10-CM | POA: Insufficient documentation

## 2010-11-05 DIAGNOSIS — I10 Essential (primary) hypertension: Secondary | ICD-10-CM | POA: Insufficient documentation

## 2010-11-05 LAB — POCT I-STAT, CHEM 8
Creatinine, Ser: 1 mg/dL (ref 0.50–1.35)
Glucose, Bld: 134 mg/dL — ABNORMAL HIGH (ref 70–99)
HCT: 42 % (ref 39.0–52.0)
Hemoglobin: 14.3 g/dL (ref 13.0–17.0)
Potassium: 3.6 mEq/L (ref 3.5–5.1)
Sodium: 136 mEq/L (ref 135–145)
TCO2: 23 mmol/L (ref 0–100)

## 2010-11-05 LAB — URINALYSIS, ROUTINE W REFLEX MICROSCOPIC
Bilirubin Urine: NEGATIVE
Glucose, UA: NEGATIVE mg/dL
Protein, ur: NEGATIVE mg/dL
Specific Gravity, Urine: 1.023 (ref 1.005–1.030)
Urobilinogen, UA: 0.2 mg/dL (ref 0.0–1.0)
pH: 5 (ref 5.0–8.0)

## 2010-11-05 LAB — CBC
MCH: 30.8 pg (ref 26.0–34.0)
MCHC: 34.4 g/dL (ref 30.0–36.0)
Platelets: 209 10*3/uL (ref 150–400)
RDW: 12.4 % (ref 11.5–15.5)

## 2010-11-05 LAB — DIFFERENTIAL
Basophils Absolute: 0 10*3/uL (ref 0.0–0.1)
Basophils Relative: 0 % (ref 0–1)
Eosinophils Relative: 0 % (ref 0–5)
Lymphocytes Relative: 15 % (ref 12–46)
Monocytes Absolute: 1 10*3/uL (ref 0.1–1.0)
Monocytes Relative: 7 % (ref 3–12)
Neutrophils Relative %: 78 % — ABNORMAL HIGH (ref 43–77)

## 2010-11-06 ENCOUNTER — Telehealth: Payer: Self-pay | Admitting: *Deleted

## 2010-11-06 NOTE — Telephone Encounter (Signed)
Called pt to follow up with him to see if he got an appointment with urologist after ER Visit. Lmoam waiting on pt to call back

## 2010-11-10 ENCOUNTER — Emergency Department (HOSPITAL_COMMUNITY): Payer: BC Managed Care – PPO

## 2010-11-10 ENCOUNTER — Ambulatory Visit (HOSPITAL_COMMUNITY)
Admission: EM | Admit: 2010-11-10 | Discharge: 2010-11-10 | Disposition: A | Discharge status: Other Acute Inpatient Hospital | Payer: BC Managed Care – PPO | Attending: Emergency Medicine | Admitting: Emergency Medicine

## 2010-11-10 DIAGNOSIS — N201 Calculus of ureter: Secondary | ICD-10-CM | POA: Insufficient documentation

## 2010-11-10 DIAGNOSIS — Z01812 Encounter for preprocedural laboratory examination: Secondary | ICD-10-CM | POA: Insufficient documentation

## 2010-11-10 DIAGNOSIS — R300 Dysuria: Secondary | ICD-10-CM | POA: Insufficient documentation

## 2010-11-10 DIAGNOSIS — N1330 Unspecified hydronephrosis: Secondary | ICD-10-CM | POA: Insufficient documentation

## 2010-11-10 DIAGNOSIS — R109 Unspecified abdominal pain: Secondary | ICD-10-CM | POA: Insufficient documentation

## 2010-11-10 DIAGNOSIS — R319 Hematuria, unspecified: Secondary | ICD-10-CM | POA: Insufficient documentation

## 2010-11-10 DIAGNOSIS — R110 Nausea: Secondary | ICD-10-CM | POA: Insufficient documentation

## 2010-11-10 DIAGNOSIS — I10 Essential (primary) hypertension: Secondary | ICD-10-CM | POA: Insufficient documentation

## 2010-11-10 LAB — DIFFERENTIAL
Basophils Relative: 0 % (ref 0–1)
Eosinophils Absolute: 0.1 10*3/uL (ref 0.0–0.7)
Eosinophils Relative: 0 % (ref 0–5)
Lymphs Abs: 1.9 10*3/uL (ref 0.7–4.0)

## 2010-11-10 LAB — POCT I-STAT, CHEM 8
Calcium, Ion: 1.17 mmol/L (ref 1.12–1.32)
Creatinine, Ser: 1.4 mg/dL — ABNORMAL HIGH (ref 0.50–1.35)
HCT: 42 % (ref 39.0–52.0)
Hemoglobin: 14.3 g/dL (ref 13.0–17.0)
Potassium: 3.9 mEq/L (ref 3.5–5.1)
Sodium: 139 mEq/L (ref 135–145)
TCO2: 27 mmol/L (ref 0–100)

## 2010-11-10 LAB — URINALYSIS, ROUTINE W REFLEX MICROSCOPIC
Bilirubin Urine: NEGATIVE
Nitrite: NEGATIVE
Protein, ur: 30 mg/dL — AB
Specific Gravity, Urine: 1.019 (ref 1.005–1.030)
Urobilinogen, UA: 0.2 mg/dL (ref 0.0–1.0)

## 2010-11-10 LAB — URINE MICROSCOPIC-ADD ON

## 2010-11-10 LAB — CBC
MCH: 30.4 pg (ref 26.0–34.0)
MCV: 91.6 fL (ref 78.0–100.0)
Platelets: 267 10*3/uL (ref 150–400)
RDW: 12.4 % (ref 11.5–15.5)
WBC: 15.5 10*3/uL — ABNORMAL HIGH (ref 4.0–10.5)

## 2010-11-20 LAB — BASIC METABOLIC PANEL
BUN: 13 mg/dL (ref 6–23)
Calcium: 9.9 mg/dL (ref 8.4–10.5)
Chloride: 104 mEq/L (ref 96–112)
Creatinine, Ser: 1.07 mg/dL (ref 0.50–1.35)
GFR calc Af Amer: >60 mL/min (ref >60)
GFR calc non Af Amer: >60 mL/min (ref >60)
Sodium: 141 mEq/L (ref 135–145)

## 2010-11-20 LAB — PROTIME-INR
INR: 0.97 (ref 0.00–1.49)
Prothrombin Time: 13.1 seconds (ref 11.6–15.2)

## 2010-11-20 LAB — CBC
HCT: 37.8 % — ABNORMAL LOW (ref 39.0–52.0)
Hemoglobin: 12.4 g/dL — ABNORMAL LOW (ref 13.0–17.0)
MCHC: 32.8 g/dL (ref 30.0–36.0)
Platelets: 316 10*3/uL (ref 150–400)
RBC: 4.13 MIL/uL — ABNORMAL LOW (ref 4.22–5.81)
RDW: 12.4 % (ref 11.5–15.5)
WBC: 9.7 10*3/uL (ref 4.0–10.5)

## 2010-11-21 ENCOUNTER — Ambulatory Visit (HOSPITAL_BASED_OUTPATIENT_CLINIC_OR_DEPARTMENT_OTHER)
Admission: RE | Admit: 2010-11-21 | Discharge: 2010-11-21 | Disposition: A | Discharge status: Home / Self Care | Payer: BC Managed Care – PPO | Source: Ambulatory Visit | Attending: Urology | Admitting: Urology

## 2010-11-21 DIAGNOSIS — I10 Essential (primary) hypertension: Secondary | ICD-10-CM | POA: Insufficient documentation

## 2010-11-21 DIAGNOSIS — N200 Calculus of kidney: Secondary | ICD-10-CM | POA: Insufficient documentation

## 2010-11-21 DIAGNOSIS — Z79899 Other long term (current) drug therapy: Secondary | ICD-10-CM | POA: Insufficient documentation

## 2010-11-21 DIAGNOSIS — Z01812 Encounter for preprocedural laboratory examination: Secondary | ICD-10-CM | POA: Insufficient documentation

## 2010-11-21 LAB — PROTIME-INR
INR: 0.95 (ref 0.00–1.49)
Prothrombin Time: 12.9 seconds (ref 11.6–15.2)

## 2010-11-21 LAB — BASIC METABOLIC PANEL
Calcium: 10 mg/dL (ref 8.4–10.5)
GFR calc Af Amer: >60 mL/min (ref >60)
GFR calc non Af Amer: >60 mL/min (ref >60)
Potassium: 3.1 mEq/L — ABNORMAL LOW (ref 3.5–5.1)
Sodium: 139 mEq/L (ref 135–145)

## 2010-11-21 LAB — CBC
Hemoglobin: 13.5 g/dL (ref 13.0–17.0)
MCHC: 33.9 g/dL (ref 30.0–36.0)
RDW: 12.4 % (ref 11.5–15.5)
WBC: 12.7 10*3/uL — ABNORMAL HIGH (ref 4.0–10.5)

## 2010-11-21 NOTE — Op Note (Signed)
NAMEABDELAZIZ, WESTENBERGER NO.:  [id number]  MEDICAL RECORD NO.:  [id number]  LOCATION:  WLED                         FACILITY:  [facility]  PHYSICIAN:  Martina Sinner, MD DATE OF BIRTH:  March 24, 1961  DATE OF PROCEDURE:  11/10/2010 DATE OF DISCHARGE:                              OPERATIVE REPORT   PREOPERATIVE DIAGNOSIS:  Left ureteral stone.  PREOPERATIVE DIAGNOSIS:  Left ureteral stone.  SURGERY:  Cystoscopy, retrograde ureterogram, insertion of left ureteral stent.  INDICATIONS:  Mr. Brick Ketcher has a 9-mm stone on the upper left ureter near the ureteropelvic junction.  He has colic.  He went to their office yesterday.  He saw into the emergency room twice.  He consented to the above procedure.  PROCEDURE:  The patient was prepped and draped in usual fashion.  He was given preoperative ciprofloxacin.  His serum creatinine is 1.4.  A 21- [place] scope was utilized.  Penile, bulbar, membranous, urethra normal. Prostatic urethra was 2.5 cm in length with minimal enlargement.  He had minimal bladder trabeculation.  Left and right ureteral orifices were easily seen.  He had a little bit of a high-riding bladder neck.  Under fluoroscopic and cystoscopic guidance, I passed a sensor wire up easily into the left kidney.  Over the wire, I passed a 6-French open-ended ureteral catheter to the mid upper ureter and removed the wire.  I did a retrograde ureterogram.  I thought I could see a filling defect near the ureteropelvic junction, though I could not be certain.  I saw minimal hydronephrosis of the left renal pelvis and briefly used the retrograde to mark the pelvis.  He may have a uric acid stone.  Retrograde ureterogram:  As described above, I injected approximately 45 cc of contrast not under pressure.  The patient was in the AP position. X-rays were taken.  He had minimal dilation of the calyces in the left renal pelvis.  He may have had a filling defect in the  upper left ureter.  I replaced the sensor wire curling in the upper pole calix.  I removed the open-ended ureteral catheter and inserted a 26 cm x 6 mm double-J stent without the string with the taper end first.  It took at least 10 minutes or longer to insert the stent a few millimeters at a time.  I was surprised from the get go that the stent did not slide easily.  I could not explain the findings unless the stone had slipped down to the lower ureter and was somewhat obstructing. Having said that, I used the scope, placed medial to the stent and gradually not under pressure pushed the stent a few millimeters at a time until it was curling up into the left renal pelvis and also appropriate in the bladder.  I removed the sensor wire to curl nicely in the bladder.  I took an x-ray with the stent in place.  Throughout the stent placement, there was no buckling of the stent and I x-rayed multiple times and I was in a good straight line trying to insert the stent.  He did have a little bit of a volcano effect from the  ureter with dark urine.  For this reason, I will send him home with antibiotics.  This patient may be allergic to the Vicodin.  I gave him a Percocet prescription  This patient will be followed up in clinic.  He may or may not be able to have lithotripsy and may need ureteroscopy in the future.          ______________________________ Martina Sinner, MD     SAM/MEDQ  D:  11/10/2010  T:  11/10/2010  Job:  [id number]  Electronically Signed by Alfredo Martinez MD on 11/21/2010 08:14:32 AM

## 2010-11-27 NOTE — Op Note (Signed)
[name], [name] NO.:  [id number]  MEDICAL RECORD NO.:  [id number]  LOCATION:                                 FACILITY:  PHYSICIAN:  Excell Seltzer. Annabell Howells, M.D.    DATE OF BIRTH:  Mar 19, 1961  DATE OF PROCEDURE:  11/21/2010 DATE OF DISCHARGE:                              OPERATIVE REPORT   PROCEDURE:  Cystoscopy, left retrograde pyelogram with interpretation, left ureteroscopy and lasertripsy, and insertion of left double-J stent.  PREOPERATIVE DIAGNOSIS:  Left renal stone.  POSTOPERATIVE DIAGNOSIS:  Left renal stone.  ANESTHESIA:  General.  SPECIMENS:  None.  DRAINS:  A 6 French 26-cm double-J stent with string.  COMPLICATIONS:  None.  INDICATIONS:  [name] is a 50 year old African American male with a 9-mm left UPJ stone who underwent stenting earlier this week and is now to undergo ureteroscopy.  Stone was not readily visualized on plain films, so lithotripsy was not felt to be an appropriate option.  FINDINGS OF PROCEDURE:  He was given Ancef 2 g.  He was taken to the operating room where general anesthetic was induced.  He was placed in lithotomy position.  His perineum and genitalia were prepped with Betadine solution and draped in usual sterile fashion.  Cystoscopy was performed using a 22 [place] scope and 12 degree lens. Examination revealed a normal urethra.  The external sphincter was intact.  The prostatic urethra showed the obstruction.  Examination of the bladder revealed the stent at the ureteral orifice.  There was already some encrustation.  There was some erythema from stent irritation but no other abnormalities were noted.  The right ureteral orifice was unremarkable.  The stent was removed to the urethral meatus and a wire was passed to the kidney.  The stent was removed the rest of way.  A 12/14 French access sheath was then inserted over the wire to the kidney without difficulty.  The inner cord and wire were removed.  The  digital flexible ureteroscope was then inserted into the kidney where the stone was visualized.  It actually appeared to be more on the order of 12 to 15 mm and 9 mm.  The stone was then engaged with a 200-micron Holmium laser at 0.5 watts and 20 Hz, the stone broke up remarkably well and with repeated fragmentation was eventually reduced to 1-3 mm fragments.  There were no residual fragments, sufficiently large to be easily removed with a basket after completion of the fragmentation.  The ureteroscope was removed and the kidney was flushed with 5-French open-ended catheter through the access sheath, not much material returned.  I did repeat a retrograde pyelogram with a 5-French open- ended catheter through the access sheath.  This revealed small punctate filling defects consistent with stone fragments, not larger than about 2- 3 mm.  The large filling defect had been eliminated.  At this point, a guidewire was re-inserted into the kidney.  The access sheath was removed and a 6-French 26 cm double-J stent with string was inserted without difficulty to the kidney.  The wire was removed giving a good coil in the kidney, good coil in the bladder.  This was confirmed  cystoscopically as well.  The bladder was drained.  B and O suppository was placed.  The stent string was left secured to the penis.  He was taken down from the lithotomy position.  His anesthetic was reversed and he was moved to the recovery room in stable condition.  He will be discharged home on Percocet, Flomax,and Pyridium with instructions to remove the stent on Thursday morning and follow up with [place] on 7/30.  He was to drain his urine as well.     Excell Seltzer. Annabell Howells, M.D.     JJW/MEDQ  D:  11/21/2010  T:  11/21/2010  Job:  [id number]  cc:   Dr. Rubbie Battiest  Electronically Signed by Bjorn Pippin M.D. on 11/27/2010 08:04:56 AM

## 2011-01-12 ENCOUNTER — Ambulatory Visit (INDEPENDENT_AMBULATORY_CARE_PROVIDER_SITE_OTHER): Payer: BC Managed Care – PPO | Admitting: Internal Medicine

## 2011-01-12 ENCOUNTER — Encounter: Payer: Self-pay | Admitting: Internal Medicine

## 2011-01-12 DIAGNOSIS — Z87442 Personal history of urinary calculi: Secondary | ICD-10-CM

## 2011-01-12 DIAGNOSIS — M79609 Pain in unspecified limb: Secondary | ICD-10-CM

## 2011-01-12 DIAGNOSIS — M79602 Pain in left arm: Secondary | ICD-10-CM

## 2011-01-12 MED ORDER — CYCLOBENZAPRINE HCL 5 MG PO TABS: ORAL_TABLET | ORAL | Status: DC

## 2011-01-12 NOTE — Patient Instructions (Signed)
Left arm pain - on exam there is no problem or abnormality in the wrist, elbow, or shoulder. There is good range of motion. This is either mild muscle strain or it could be mild neck related pain that could be causing nerve pressure leading to tingling in the hand and pain in the arm. THIS IS NOT IN ANY WAY RELATED TO THE HEART. Plan - be sure to loosen up before golf or major activity; for muscle spasm it is ok to take flexeril 5mg  either 1/2 or 1 tablet; for pain it is ok to take aleve 1 tablet up to three times a day. For persistent tingling, numbness or loss of strength let me know and we will move ahead with x-rays.  Uric acid kidney stone. It is important to keep the urine alkaline. Taking a little baking soda, sodium bicarbonate, in water once a day can help prevent stones.

## 2011-01-14 NOTE — Assessment & Plan Note (Signed)
Reviewed operative report : 12mm stone!! Successful lithotripsy. Per patient report Uric acid stone. Reviewed UpToDate for prophylaxis: recommendation is to alkalinize the urine, one treatment being sodium bicarbonate.

## 2011-01-14 NOTE — Progress Notes (Signed)
  Subjective:    Patient ID: [name], male    DOB: November 03, 1960, 50 y.o.   MRN: [id number]  HPI Mr. Penson presents for several week h/o pain in the left arm from trapezius region to the hand with some paresthesia. He does a lot of physical work and plays golf. The discomfort has not restricted his activities but he does have aggravation of his symptoms. He does not recall any precipitating event. He has not had any swelling, joint inflammation or decreased motion.  In the interval since his last visit he has had bouts of renal colic with nephrolithiasis with the last episode requiring ureteral laser lithotripsy with stent placement. He was able to capture stones that were uric acid stone.   I have reviewed the patient's medical history in detail and updated the computerized patient record.    Review of Systems System review is negative for any constitutional, cardiac, pulmonary, GI or neuro symptoms or complaints     Objective:   Physical Exam Vitals noted - BP at the borderline Gen-l - muscular AA man in no acute distress Resp - normal Cor - RRR MSK - no deformity of small, medium or large joints, no synovial thickening, no erythem, full ROM all joints including the shoulder with no crepitus or click. Neuro-vascularly intact.       Assessment & Plan:  Arm pain - no orthopedic findings. Question of central origin - cervical spine.   Plan - He is reassured that this is not a cardiac problem           Advised of possible central origin of pain: recommend ROM exercise; ok to use APAP or NSAID           For paresthesia, increasing pain or limitation in activity will move to imaging.

## 2011-01-29 ENCOUNTER — Ambulatory Visit (INDEPENDENT_AMBULATORY_CARE_PROVIDER_SITE_OTHER)
Admission: RE | Admit: 2011-01-29 | Discharge: 2011-01-29 | Disposition: A | Discharge status: Home / Self Care | Payer: BC Managed Care – PPO | Source: Ambulatory Visit | Attending: Internal Medicine | Admitting: Internal Medicine

## 2011-01-29 ENCOUNTER — Ambulatory Visit (INDEPENDENT_AMBULATORY_CARE_PROVIDER_SITE_OTHER): Payer: BC Managed Care – PPO | Admitting: Internal Medicine

## 2011-01-29 VITALS — BP 138/88 | HR 67 | Temp 98.7°F | Wt 232.0 lb

## 2011-01-29 DIAGNOSIS — M79609 Pain in unspecified limb: Secondary | ICD-10-CM

## 2011-01-29 DIAGNOSIS — M79602 Pain in left arm: Secondary | ICD-10-CM

## 2011-01-29 MED ORDER — PREDNISONE 10 MG PO TABS: 10.0000 mg | ORAL_TABLET | Freq: Every day | ORAL | Status: AC

## 2011-01-29 NOTE — Progress Notes (Signed)
  Subjective:    Patient ID: [name], male    DOB: 10-25-1960, 50 y.o.   MRN: [id number]  HPI [name] presents with persistent intermittent pain in the Left UE with pain, paresthesia and weakness. OTC meds have not helped.  I have reviewed the patient's medical history in detail and updated the computerized patient record.    Review of Systems System review is negative for any constitutional, cardiac, pulmonary, GI or neuro symptoms or complaints     Objective:   Physical Exam Vitals reveiwed Gen'l - WNWD muscular AA man in no distress Extremity - Lef arm wth full ROM wrist, elbow, shoulder. No small joint deformity hands. Neuro - normal sensation to light touch Left UE. MS normal.        Assessment & Plan:

## 2011-01-29 NOTE — Patient Instructions (Signed)
Continue left arm pain is very suggestive of a pinched nerve in the neck. Plan - x-rays of the neck today. Prednisone burst and taper to help reduce swelling that could causing pain. If the x-rays reveal any disk or arthritic changes will move ahead to an MRI.   Cervical Radiculopathy Cervical radiculopathy is a pinched nerve in the neck. When this happens you may have pain or numbness shooting from your neck all the way down into your arm and fingers. There are many causes of this problem. Sometimes this may happen from an injury or simply from muscle tightness in the neck from overuse. It may also happen from arthritis or boney problems. If there is no improvement after treatment, further studies may be done to find the exact cause. DIAGNOSIS X-rays may be needed if the problems become long standing. Electromyograms may be done. This study is one in which the working of nerves and muscles is studied. HOME CARE INSTRUCTIONS  Applications of ice packs may be helpful. Ice can be used in a plastic bag with a towel around it to prevent frostbite to skin. This may be used every 2 hours for 20 to 30 minutes, or as needed, while awake or as directed by your caregiver.   Sleep at night with a flat pillow.   Only take over-the-counter or prescription medicines for pain, discomfort, or fever as directed by your caregiver.   If physical therapy was prescribed, follow your caregiver's directions. If a cervical collar or cervical traction device was prescribed, use them as directed.  SEEK IMMEDIATE MEDICAL CARE IF:  You have pain not controlled with medications.   You seem to be getting worse rather than better.   You develop weakness in your hand or arm.   You develop new numbness or loss of feeling in your arms or legs.   You develop loss of bowel or bladder control.   You have difficulty with walking or balance, or develop clumsiness in the use of your arms or legs.  MAKE SURE YOU:    Understand  these instructions.   Will watch your condition.   Will get help right away if you are not doing well or get worse.  Document Released: 01/16/2001 Document Re-Released: 08/09/2008 [facility] Patient Information 2011 [place], [place].

## 2011-01-30 ENCOUNTER — Telehealth: Payer: Self-pay | Admitting: *Deleted

## 2011-01-30 DIAGNOSIS — M79602 Pain in left arm: Secondary | ICD-10-CM | POA: Insufficient documentation

## 2011-01-30 NOTE — Assessment & Plan Note (Signed)
Persistent pain with no visible abnormality but a set of symptoms suggestive of cervical radiculopathy  Plan - c-spine x rays. If suggestive of DDD or DJD - MRI neck.  Addendum: Clinical Data: Left-sided neck pain radiating to the left shoulder  and arm, no injury  CERVICAL SPINE - COMPLETE 4+ VIEW  Comparison: None.  Findings: The cervical vertebrae are in normal alignment. Minimal  anterior osteophyte formation is present at C4-5. Intervertebral  disc spaces are normal. No prevertebral soft tissue swelling is  seen. On oblique views the foramina are patent. The odontoid  process is intact. The lung apices are clear.  IMPRESSION:  Normal alignment with only minimal anterior osteophyte formation at  C4-5.  Original Report Authenticated By: Juline Patch, M.D.  Plan - burst and taper of steroids           For unrelieved pain will need to move forward with MRI neck

## 2011-01-30 NOTE — Telephone Encounter (Signed)
1. Patient requesting results of Xray 2. Patient wants to know if pain could be related to his work? 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

## 2011-01-31 NOTE — Telephone Encounter (Signed)
Pt informed and I also informed pt that after finishing the steriod taper if his symptoms were no better to call the office so Dr Debby Bud could set up MRI

## 2011-01-31 NOTE — Telephone Encounter (Signed)
Cervical spine films without clear cut abnormality. If pain not improved with steroids will move forward with MRI.

## 2011-01-31 NOTE — Telephone Encounter (Signed)
I cannot say whether this is work related or not since there is no definite history of injury and no sign of injury on the x-rays.

## 2011-01-31 NOTE — Telephone Encounter (Signed)
Pt wants to know if this could be work related?

## 2011-02-01 LAB — COMPREHENSIVE METABOLIC PANEL
ALT: 67 — ABNORMAL HIGH
AST: 29
Albumin: 3.7
Albumin: 4.3
Alkaline Phosphatase: 28 — ABNORMAL LOW
BUN: 12
BUN: 6
Calcium: 8.2 — ABNORMAL LOW
Calcium: 9.1
Chloride: 105
Chloride: 107
Creatinine, Ser: 0.88
GFR calc Af Amer: >60
GFR calc non Af Amer: >60
Potassium: 3.5
Sodium: 142
Total Protein: 6.3
Total Protein: 7.2

## 2011-02-01 LAB — BASIC METABOLIC PANEL
BUN: 3 — ABNORMAL LOW
BUN: 7
CO2: 30
CO2: 32
Calcium: 8 — ABNORMAL LOW
Calcium: 8.4
Calcium: 8.5
Chloride: 102
Creatinine, Ser: 0.94
GFR calc Af Amer: >60
GFR calc non Af Amer: >60
GFR calc non Af Amer: >60
GFR calc non Af Amer: >60
Glucose, Bld: 112 — ABNORMAL HIGH
Glucose, Bld: 148 — ABNORMAL HIGH
Glucose, Bld: 94
Potassium: 3 — ABNORMAL LOW
Potassium: 3.3 — ABNORMAL LOW
Potassium: 3.7
Sodium: 139
Sodium: 139

## 2011-02-01 LAB — AMYLASE
Amylase: 154 — ABNORMAL HIGH
Amylase: 107

## 2011-02-01 LAB — CBC
HCT: 32.1 — ABNORMAL LOW
HCT: 39.3
HCT: 41.5
Hemoglobin: 10.3 — ABNORMAL LOW
Hemoglobin: 10.7 — ABNORMAL LOW
MCHC: 33.6
MCHC: 33.9
MCHC: 33.9
MCV: 92
MCV: 92.9
MCV: 92.2
Platelets: 234
Platelets: 239
Platelets: 251
Platelets: 411 — ABNORMAL HIGH
Platelets: 477 — ABNORMAL HIGH
RBC: 3.45 — ABNORMAL LOW
RBC: 4.51
RBC: 3.35 — ABNORMAL LOW
RDW: 13.3
RDW: 13.4
RDW: 13.5
RDW: 13.5
RDW: 13.7
WBC: 16.5 — ABNORMAL HIGH
WBC: 14.7 — ABNORMAL HIGH

## 2011-02-01 LAB — MAGNESIUM: Magnesium: 2.4

## 2011-02-01 LAB — URINE MICROSCOPIC-ADD ON

## 2011-02-01 LAB — DIFFERENTIAL
Basophils Absolute: 0
Eosinophils Absolute: 0
Lymphocytes Relative: 6 — ABNORMAL LOW
Lymphs Abs: 1.2
Monocytes Absolute: 0.9
Monocytes Relative: 5
Neutro Abs: 17.1 — ABNORMAL HIGH
Neutrophils Relative %: 89 — ABNORMAL HIGH

## 2011-02-01 LAB — URINALYSIS, ROUTINE W REFLEX MICROSCOPIC
Glucose, UA: NEGATIVE
Ketones, ur: 15 — AB
Leukocytes, UA: NEGATIVE
Leukocytes, UA: NEGATIVE
Nitrite: NEGATIVE
Nitrite: NEGATIVE
Protein, ur: 100 — AB
Specific Gravity, Urine: 1.017
pH: 5
pH: 5.5

## 2011-02-01 LAB — HEMOGLOBIN AND HEMATOCRIT, BLOOD
HCT: 34.2 — ABNORMAL LOW
Hemoglobin: 11.3 — ABNORMAL LOW

## 2011-02-01 LAB — LIPASE, BLOOD
Lipase: 139 — ABNORMAL HIGH
Lipase: 184 — ABNORMAL HIGH
Lipase: 25
Lipase: 845 — ABNORMAL HIGH
Lipase: 62 — ABNORMAL HIGH
Lipase: 93 — ABNORMAL HIGH

## 2011-02-01 LAB — HEPATIC FUNCTION PANEL
AST: 22
AST: 34
Albumin: 2.8 — ABNORMAL LOW
Bilirubin, Direct: 0.5 — ABNORMAL HIGH
Bilirubin, Direct: 0.5 — ABNORMAL HIGH
Bilirubin, Direct: 0.9 — ABNORMAL HIGH
Indirect Bilirubin: 1.1 — ABNORMAL HIGH
Indirect Bilirubin: 1.2 — ABNORMAL HIGH
Indirect Bilirubin: 1.4 — ABNORMAL HIGH
Total Protein: 6
Total Protein: 6.8

## 2011-06-25 ENCOUNTER — Telehealth: Payer: Self-pay | Admitting: Internal Medicine

## 2011-06-25 ENCOUNTER — Other Ambulatory Visit: Payer: Self-pay | Admitting: Internal Medicine

## 2011-06-25 MED ORDER — AMLODIPINE BESY-BENAZEPRIL HCL 5-20 MG PO CAPS: 1.0000 | ORAL_CAPSULE | Freq: Every day | ORAL | Status: DC

## 2011-06-25 MED ORDER — FUROSEMIDE 20 MG PO TABS: 20.0000 mg | ORAL_TABLET | Freq: Every day | ORAL | Status: DC

## 2011-06-25 NOTE — Telephone Encounter (Signed)
Done

## 2011-06-25 NOTE — Telephone Encounter (Signed)
REQUESTING REFILLS ON FUROSEMIDE AND AMLODIPINE.  HE HAS CALLED THE PHARMACY ALSO.  HE USES [facility] ON [address].

## 2011-07-20 ENCOUNTER — Other Ambulatory Visit: Payer: Self-pay | Admitting: Internal Medicine

## 2011-11-09 ENCOUNTER — Encounter (HOSPITAL_BASED_OUTPATIENT_CLINIC_OR_DEPARTMENT_OTHER): Payer: Self-pay | Admitting: *Deleted

## 2011-11-09 ENCOUNTER — Emergency Department (HOSPITAL_BASED_OUTPATIENT_CLINIC_OR_DEPARTMENT_OTHER)
Admission: EM | Admit: 2011-11-09 | Discharge: 2011-11-09 | Disposition: A | Discharge status: Home / Self Care | Payer: BC Managed Care – PPO | Attending: Emergency Medicine | Admitting: Emergency Medicine

## 2011-11-09 DIAGNOSIS — K625 Hemorrhage of anus and rectum: Secondary | ICD-10-CM | POA: Insufficient documentation

## 2011-11-09 DIAGNOSIS — Z87891 Personal history of nicotine dependence: Secondary | ICD-10-CM | POA: Insufficient documentation

## 2011-11-09 DIAGNOSIS — I10 Essential (primary) hypertension: Secondary | ICD-10-CM | POA: Insufficient documentation

## 2011-11-09 DIAGNOSIS — R1032 Left lower quadrant pain: Secondary | ICD-10-CM | POA: Insufficient documentation

## 2011-11-09 DIAGNOSIS — R1012 Left upper quadrant pain: Secondary | ICD-10-CM | POA: Insufficient documentation

## 2011-11-09 DIAGNOSIS — Z87442 Personal history of urinary calculi: Secondary | ICD-10-CM | POA: Insufficient documentation

## 2011-11-09 LAB — CBC WITH DIFFERENTIAL/PLATELET
Basophils Absolute: 0 10*3/uL (ref 0.0–0.1)
Eosinophils Relative: 1 % (ref 0–5)
Lymphocytes Relative: 36 % (ref 12–46)
Lymphs Abs: 3.4 10*3/uL (ref 0.7–4.0)
MCV: 90.2 fL (ref 78.0–100.0)
Monocytes Relative: 10 % (ref 3–12)
Neutro Abs: 4.9 10*3/uL (ref 1.7–7.7)
Platelets: 221 10*3/uL (ref 150–400)
RBC: 4.49 MIL/uL (ref 4.22–5.81)
RDW: 12.3 % (ref 11.5–15.5)
WBC: 9.4 10*3/uL (ref 4.0–10.5)

## 2011-11-09 LAB — BASIC METABOLIC PANEL
CO2: 26 mEq/L (ref 19–32)
Calcium: 9.6 mg/dL (ref 8.4–10.5)
Chloride: 102 mEq/L (ref 96–112)
Glucose, Bld: 101 mg/dL — ABNORMAL HIGH (ref 70–99)
Potassium: 3.5 mEq/L (ref 3.5–5.1)
Sodium: 141 mEq/L (ref 135–145)

## 2011-11-09 LAB — OCCULT BLOOD X 1 CARD TO LAB, STOOL: Fecal Occult Bld: POSITIVE

## 2011-11-09 MED ORDER — OMEPRAZOLE 20 MG PO CPDR: DELAYED_RELEASE_CAPSULE | ORAL | Status: DC

## 2011-11-09 NOTE — ED Notes (Signed)
3 days ago he had abdominal pain and a bowel movement with blood in it after taking a laxative for constipation. This am his bowel movement had dark blood in it. Admits to drinking alcohol everyday and hx of pancreatitis. No vomiting.

## 2011-11-09 NOTE — ED Provider Notes (Signed)
History     CSN: [id number]  Arrival date & time 11/09/11  1227   First MD Initiated Contact with Patient 11/09/11 1316      Chief Complaint  Patient presents with  . Rectal Bleeding    (Consider location/radiation/quality/duration/timing/severity/associated sxs/prior treatment) HPI Comments: Patient presents with complaint of episodes of red blood per rectum. The first episode was 3 days ago and occurred with one bowel movement. Patient denies any pain with the bowel movement. He was having normal bowel movements until this morning when he had another episode of dark red blood mixed in with his stools and in the water of the toilet. He states this is more substantial than the one 3 days ago. Patient denies significant abdominal pain. His history of pancreatitis but this does not feel the same. The patient does not have history of hemorrhoids. His never had a colonoscopy. He does not have a family history of colon cancer or diverticulitis. Patient drinks wine and beer several days a week. He admits to drinking 'more than I should'. The patient does not take any medications for this problem however he does state he took a stool softener several days ago for constipation. Onset was acute. Course is intermittent. Nothing makes symptoms better or worse.  Patient is a 51 y.o. male presenting with hematochezia. The history is provided by the patient and the spouse.  Rectal Bleeding  The current episode started 3 to 5 days ago. The onset was sudden. The problem occurs occasionally. The problem has been unchanged. The patient is experiencing no pain. Pertinent negatives include no fever, no abdominal pain, no diarrhea, no hemorrhoids, no nausea, no rectal pain, no vomiting, no hematuria, no chest pain, no headaches, no coughing and no rash.    Past Medical History  Diagnosis Date  . Ureteral stent displacement     1990  . Nephrolithiasis   . Hypertension   . Pancreatitis   . Kidney stones      Past Surgical History  Procedure Date  . Ureteroscopy     and stone retreval  . A2 pulley flexor sheath cyst 4th finger excisional biopsy     '03    No family history on file.  History  Substance Use Topics  . Smoking status: Former Games developer  . Smokeless tobacco: Not on file  . Alcohol Use: Yes      Review of Systems  Constitutional: Negative for fever.  HENT: Negative for sore throat and rhinorrhea.   Eyes: Negative for redness.  Respiratory: Negative for cough.   Cardiovascular: Negative for chest pain.  Gastrointestinal: Positive for hematochezia. Negative for nausea, vomiting, abdominal pain, diarrhea, rectal pain and hemorrhoids.  Genitourinary: Negative for dysuria and hematuria.  Musculoskeletal: Negative for myalgias.  Skin: Negative for rash.  Neurological: Negative for headaches.    Allergies  Review of patient's allergies indicates no known allergies.  Home Medications   Current Outpatient Rx  Name Route Sig Dispense Refill  . AMLODIPINE BESY-BENAZEPRIL HCL 5-20 MG PO CAPS Oral Take 1 capsule by mouth daily. 30 capsule 5  . CYCLOBENZAPRINE HCL 5 MG PO TABS  Take 1/2 tablet every 6 hours as needed for muscle spasm. 30 tablet 2  . FUROSEMIDE 20 MG PO TABS Oral Take 1 tablet (20 mg total) by mouth daily. 30 tablet 5  . RANITIDINE HCL 150 MG PO CAPS Oral Take 150 mg by mouth 2 (two) times daily.      [name] RANITIDINE HCL 150 MG PO TABS  TAKE ONE TABLET BY MOUTH TWICE DAILY FOR  STOMACH  ACID 60 tablet 2    BP 137/92  Pulse 80  Temp 98.4 F (36.9 C) (Oral)  Resp 20  SpO2 100%  Physical Exam  Nursing note and vitals reviewed. Constitutional: He appears well-developed and well-nourished.  HENT:  Head: Normocephalic and atraumatic.  Eyes: Conjunctivae are normal. Right eye exhibits no discharge. Left eye exhibits no discharge.  Neck: Normal range of motion. Neck supple.  Cardiovascular: Normal rate, regular rhythm and normal heart sounds.    Pulmonary/Chest: Effort normal and breath sounds normal.  Abdominal: Soft. Bowel sounds are normal. He exhibits no distension. There is tenderness (mild) in the left upper quadrant and left lower quadrant. There is no rigidity, no rebound, no guarding, no tenderness at McBurney's point and negative Murphy's sign.  Genitourinary: Rectal exam shows no external hemorrhoid, no internal hemorrhoid, no fissure, no mass, no tenderness and anal tone normal. Guaiac positive stool. Prostate is not tender.  Neurological: He is alert.  Skin: Skin is warm and dry.  Psychiatric: He has a normal mood and affect.    ED Course  Procedures (including critical care time)  Labs Reviewed  BASIC METABOLIC PANEL - Abnormal; Notable for the following:    Glucose, Bld 101 (*)     GFR calc non Af Amer 87 (*)     All other components within normal limits  CBC WITH DIFFERENTIAL  OCCULT BLOOD X 1 CARD TO LAB, STOOL   No results found.   1. Rectal bleeding     Patient seen and examined. Work-up initiated. Rectal exam performed with chaperone at bedside.   Vital signs reviewed and are as follows: Filed Vitals:   11/09/11 1237  BP: 137/92  Pulse: 80  Temp: 98.4 F (36.9 C)  Resp: 20   Patient discussed with Dr. Radford Pax. Agree PCP follow-up. No systemic symptoms or red flags.   Patient and wife informed on plan. Urged to return with worsening pain, fever, other source of bleeding, worsening amount of bleeding. They verbalize understanding and agree with plan.   Counseled on diet (increase fiber), empiric trial of omeprazole.    MDM  Lower GI bleeding suspected given passage of heme positive stool, bright red blood per rectum, red blood on toilet paper or in toilet bowl, no melanotic stools, presentation not consistent with a large upper GI bleed.  The following differential diagnoses were considered for this patient's lower GI bleed: *Hemorrhoids *Anal fissure *Colonic  polyp *Proctitis *IBD *Infectious diarrhea *Diverticulosis *Mesenteric ischemia *Colon cancer *Arteriovenous malformation  None of the following red flags identified or suspected: *Abnormal vital signs *Symptoms suggestive of malignancy such as constitutional symptoms (fever, weight loss), anemia, or change in frequency, caliber or consistency of stools *Family history of colon cancer  The patient appears reasonably screened and/or stabilized for discharge and I doubt any other medical condition or other emergency medical conditions requiring further screening, evaluation, or treatment in the ED at this time prior to discharge.  Follow-up: Age greater than 40 -- GI/PCP referral for sigmoidoscopy or colonoscopy        Renne Crigler, [place] 11/09/11 1450

## 2011-11-10 NOTE — ED Provider Notes (Signed)
Medical screening examination/treatment/procedure(s) were performed by non-physician practitioner and as supervising physician I was immediately available for consultation/collaboration.    Slaton Reaser L Yaziel Brandon, MD 11/10/11 0709 

## 2011-11-12 ENCOUNTER — Other Ambulatory Visit (INDEPENDENT_AMBULATORY_CARE_PROVIDER_SITE_OTHER): Payer: BC Managed Care – PPO

## 2011-11-12 ENCOUNTER — Encounter: Payer: Self-pay | Admitting: Internal Medicine

## 2011-11-12 ENCOUNTER — Encounter: Payer: Self-pay | Admitting: Gastroenterology

## 2011-11-12 ENCOUNTER — Ambulatory Visit (INDEPENDENT_AMBULATORY_CARE_PROVIDER_SITE_OTHER)
Admission: RE | Admit: 2011-11-12 | Discharge: 2011-11-12 | Disposition: A | Discharge status: Home / Self Care | Payer: BC Managed Care – PPO | Source: Ambulatory Visit | Attending: Internal Medicine | Admitting: Internal Medicine

## 2011-11-12 ENCOUNTER — Ambulatory Visit (INDEPENDENT_AMBULATORY_CARE_PROVIDER_SITE_OTHER): Payer: BC Managed Care – PPO | Admitting: Internal Medicine

## 2011-11-12 VITALS — BP 130/90 | HR 80 | Temp 98.2°F | Resp 16 | Wt 236.0 lb

## 2011-11-12 DIAGNOSIS — M25569 Pain in unspecified knee: Secondary | ICD-10-CM

## 2011-11-12 DIAGNOSIS — N400 Enlarged prostate without lower urinary tract symptoms: Secondary | ICD-10-CM | POA: Insufficient documentation

## 2011-11-12 DIAGNOSIS — M25562 Pain in left knee: Secondary | ICD-10-CM | POA: Insufficient documentation

## 2011-11-12 DIAGNOSIS — K921 Melena: Secondary | ICD-10-CM | POA: Insufficient documentation

## 2011-11-12 LAB — CBC WITH DIFFERENTIAL/PLATELET
Basophils Relative: 0.5 % (ref 0.0–3.0)
Eosinophils Relative: 1 % (ref 0.0–5.0)
HCT: 42 % (ref 39.0–52.0)
Hemoglobin: 14.1 g/dL (ref 13.0–17.0)
Lymphocytes Relative: 37.1 % (ref 12.0–46.0)
Lymphs Abs: 3.2 10*3/uL (ref 0.7–4.0)
MCV: 93.7 fl (ref 78.0–100.0)
Monocytes Absolute: 0.7 10*3/uL (ref 0.1–1.0)
Monocytes Relative: 8.6 % (ref 3.0–12.0)
Platelets: 245 10*3/uL (ref 150.0–400.0)
RBC: 4.48 Mil/uL (ref 4.22–5.81)
WBC: 8.5 10*3/uL (ref 4.5–10.5)

## 2011-11-12 LAB — PSA: PSA: 0.5 ng/mL (ref 0.10–4.00)

## 2011-11-12 MED ORDER — TRAMADOL HCL 50 MG PO TABS: 50.0000 mg | ORAL_TABLET | Freq: Three times a day (TID) | ORAL | Status: AC | PRN

## 2011-11-12 NOTE — Assessment & Plan Note (Signed)
His exam is normal, I will check a plain film to look for DJD

## 2011-11-12 NOTE — Progress Notes (Signed)
Subjective:    Patient ID: [name], male    DOB: Mar 26, 1961, 51 y.o.   MRN: [id number]  Arthritis Presents for initial visit. The disease course has been improving. The condition has lasted for 2 weeks. He complains of pain and stiffness. He reports no joint swelling or joint warmth. Affected locations include the left knee. His pain is at a severity of 2/10. Pertinent negatives include no diarrhea, dry eyes, dry mouth, dysuria, fatigue, fever, pain at night, pain while resting, rash, Raynaud's syndrome, uveitis or weight loss. His pertinent risk factors include overuse. Past treatments include NSAIDs. The treatment provided moderate relief. Compliance with prior treatments has been good.      Review of Systems  Constitutional: Negative for fever, chills, weight loss, diaphoresis, activity change, appetite change, fatigue and unexpected weight change.  HENT: Negative.   Eyes: Negative.   Respiratory: Negative for cough, chest tightness, shortness of breath, wheezing and stridor.   Cardiovascular: Negative for chest pain, palpitations and leg swelling.  Gastrointestinal: Positive for constipation and blood in stool (he has noticed blood when he wipes for one week). Negative for nausea, vomiting, abdominal pain, diarrhea, abdominal distention, anal bleeding and rectal pain.  Genitourinary: Negative for dysuria and difficulty urinating.  Musculoskeletal: Positive for arthralgias (left knee pain), arthritis and stiffness. Negative for myalgias, back pain, joint swelling and gait problem.  Skin: Negative for color change, pallor, rash and wound.  Neurological: Negative.   Hematological: Negative for adenopathy. Does not bruise/bleed easily.  Psychiatric/Behavioral: Negative.        Objective:   Physical Exam  Vitals reviewed. Constitutional: He is oriented to person, place, and time. He appears well-developed and well-nourished. No distress.  HENT:  Head: Normocephalic and atraumatic.   Mouth/Throat: Oropharynx is clear and moist. No oropharyngeal exudate.  Eyes: Conjunctivae are normal. Right eye exhibits no discharge. Left eye exhibits no discharge. No scleral icterus.  Neck: Normal range of motion. Neck supple. No JVD present. No tracheal deviation present. No thyromegaly present.  Cardiovascular: Normal rate, regular rhythm, normal heart sounds and intact distal pulses.  Exam reveals no gallop and no friction rub.   No murmur heard. Pulmonary/Chest: Effort normal and breath sounds normal. No stridor. No respiratory distress. He has no wheezes. He has no rales. He exhibits no tenderness.  Abdominal: Soft. Bowel sounds are normal. He exhibits no distension and no mass. There is no tenderness. There is no rebound and no guarding. Hernia confirmed negative in the right inguinal area and confirmed negative in the left inguinal area.  Genitourinary: Testes normal and penis normal. Rectal exam shows internal hemorrhoid. Rectal exam shows no external hemorrhoid, no fissure, no mass, no tenderness and anal tone normal. Guaiac positive stool. Prostate is enlarged (2+ smooth symmetrical BPH). Prostate is not tender. Right testis shows no mass, no swelling and no tenderness. Right testis is descended. Left testis shows no mass, no swelling and no tenderness. Left testis is descended. Circumcised. No penile tenderness. No discharge found.  Musculoskeletal: Normal range of motion. He exhibits no edema and no tenderness.       Left knee: Normal. He exhibits normal range of motion, no swelling, no effusion, no ecchymosis, no deformity, no laceration, no erythema, normal alignment, no LCL laxity, normal patellar mobility and no bony tenderness. no tenderness found.  Lymphadenopathy:    He has no cervical adenopathy.       Right: No inguinal adenopathy present.       Left: No inguinal  adenopathy present.  Neurological: He is oriented to person, place, and time.  Skin: Skin is warm and dry. No  rash noted. He is not diaphoretic. No erythema. No pallor.  Psychiatric: He has a normal mood and affect. His behavior is normal. Judgment and thought content normal.      Lab Results  Component Value Date   WBC 9.4 11/09/2011   HGB 14.1 11/09/2011   HCT 40.5 11/09/2011   PLT 221 11/09/2011   GLUCOSE 101* 11/09/2011   CHOL 219* 06/08/2010   TRIG 186.0* 06/08/2010   HDL 67.70 06/08/2010   LDLDIRECT 143.6 06/08/2010   ALT 29 06/08/2010   AST 29 06/08/2010   NA 141 11/09/2011   K 3.5 11/09/2011   CL 102 11/09/2011   CREATININE 1.00 11/09/2011   BUN 13 11/09/2011   CO2 26 11/09/2011   TSH 0.90 06/08/2010   PSA 0.50 06/08/2010   INR 0.95 11/21/2010      Assessment & Plan:

## 2011-11-12 NOTE — Patient Instructions (Signed)

## 2011-11-12 NOTE — Assessment & Plan Note (Signed)
He has internal hemorrhoids but I am concerned about bleeding up higher as well so I have referred him to GI, today I will recheck his CBC to see if there has been blood loss, he will continue fiber for the constipation

## 2011-11-12 NOTE — Assessment & Plan Note (Signed)
I will check his PSA today 

## 2011-11-14 ENCOUNTER — Telehealth: Payer: Self-pay | Admitting: Internal Medicine

## 2011-11-14 NOTE — Telephone Encounter (Signed)
Spoke with Darcey Nora RN regarding this appt. Pt left on schedule to see Dr. Jarold Motto since appt already made and unable to reach pt. Dr. Jarold Motto can see pt and then send them back to Dr. Marina Goodell. Chart has been ordered.

## 2011-11-15 ENCOUNTER — Ambulatory Visit: Payer: BC Managed Care – PPO | Admitting: Gastroenterology

## 2011-11-15 ENCOUNTER — Encounter: Payer: Self-pay | Admitting: Internal Medicine

## 2011-11-21 ENCOUNTER — Telehealth: Payer: Self-pay | Admitting: Internal Medicine

## 2011-11-21 NOTE — Telephone Encounter (Signed)
Continued pain to left knee despite using Aleve, and Tramadol, and ice.  Calling to inquire if there is anything else he can be doing to help.  Triaged per Knee-Non Injury and advised to see Provider within 24hrs and home care advice given.

## 2011-11-22 NOTE — Telephone Encounter (Signed)
OV for evaluation?

## 2011-11-22 NOTE — Telephone Encounter (Signed)
Called patient - her prefers to wait to come in for an ov.  He states he will call if his knee pain gets worse.

## 2011-11-29 ENCOUNTER — Other Ambulatory Visit: Payer: Self-pay | Admitting: *Deleted

## 2011-11-29 MED ORDER — OMEPRAZOLE 20 MG PO CPDR: DELAYED_RELEASE_CAPSULE | ORAL | Status: DC

## 2011-11-29 NOTE — Telephone Encounter (Signed)
Refill Rx  Refill sent to [facility]

## 2011-12-10 ENCOUNTER — Ambulatory Visit (INDEPENDENT_AMBULATORY_CARE_PROVIDER_SITE_OTHER): Payer: BC Managed Care – PPO | Admitting: Gastroenterology

## 2011-12-10 ENCOUNTER — Encounter: Payer: Self-pay | Admitting: Internal Medicine

## 2011-12-10 ENCOUNTER — Ambulatory Visit: Payer: BC Managed Care – PPO | Admitting: Gastroenterology

## 2011-12-10 ENCOUNTER — Encounter: Payer: Self-pay | Admitting: Gastroenterology

## 2011-12-10 VITALS — BP 118/84 | HR 84 | Ht 72.0 in | Wt 227.0 lb

## 2011-12-10 DIAGNOSIS — K921 Melena: Secondary | ICD-10-CM

## 2011-12-10 MED ORDER — PEG-KCL-NACL-NASULF-NA ASC-C 100 G PO SOLR: 1.0000 | Freq: Once | ORAL | Status: DC

## 2011-12-10 NOTE — Patient Instructions (Addendum)
Colonoscopy A colonoscopy is an exam to evaluate your entire colon. In this exam, your colon is cleansed. A long fiberoptic tube is inserted through your rectum and into your colon. The fiberoptic scope (endoscope) is a long bundle of enclosed and very flexible fibers. These fibers transmit light to the area examined and send images from that area to your caregiver. Discomfort is usually minimal. You may be given a drug to help you sleep (sedative) during or prior to the procedure. This exam helps to detect lumps (tumors), polyps, inflammation, and areas of bleeding. Your caregiver may also take a small piece of tissue (biopsy) that will be examined under a microscope. LET YOUR CAREGIVER KNOW ABOUT:   Allergies to food or medicine.   Medicines taken, including vitamins, herbs, eyedrops, over-the-counter medicines, and creams.   Use of steroids (by mouth or creams).   Previous problems with anesthetics or numbing medicines.   History of bleeding problems or blood clots.   Previous surgery.   Other health problems, including diabetes and kidney problems.   Possibility of pregnancy, if this applies.  BEFORE THE PROCEDURE   A clear liquid diet may be required for 2 days before the exam.   Ask your caregiver about changing or stopping your regular medications.   Liquid injections (enemas) or laxatives may be required.   A large amount of electrolyte solution may be given to you to drink over a short period of time. This solution is used to clean out your colon.   You should be present 60 minutes prior to your procedure or as directed by your caregiver.  AFTER THE PROCEDURE   If you received a sedative or pain relieving medication, you will need to arrange for someone to drive you home.   Occasionally, there is a little blood passed with the first bowel movement. Do not be concerned.  FINDING OUT THE RESULTS OF YOUR TEST Not all test results are available during your visit. If your test  results are not back during the visit, make an appointment with your caregiver to find out the results. Do not assume everything is normal if you have not heard from your caregiver or the medical facility. It is important for you to follow up on all of your test results. HOME CARE INSTRUCTIONS   It is not unusual to pass moderate amounts of gas and experience mild abdominal cramping following the procedure. This is due to air being used to inflate your colon during the exam. Walking or a warm pack on your belly (abdomen) may help.   You may resume all normal meals and activities after sedatives and medicines have worn off.   Only take over-the-counter or prescription medicines for pain, discomfort, or fever as directed by your caregiver. Do not use aspirin or blood thinners if a biopsy was taken. Consult your caregiver for medicine usage if biopsies were taken.  SEEK IMMEDIATE MEDICAL CARE IF:   You have a fever.   You pass large blood clots or fill a toilet with blood following the procedure. This may also occur 10 to 14 days following the procedure. This is more likely if a biopsy was taken.   You develop abdominal pain that keeps getting worse and cannot be relieved with medicine.  Document Released: 04/20/2000 Document Revised: 04/12/2011 Document Reviewed: 12/04/2007 [employer] Patient Information 2012 [employer]. 

## 2011-12-10 NOTE — Progress Notes (Signed)
History of Present Illness: Pleasant 51 year old Afro-American male with history of alcohol-related pancreatitis referred at the request of Dr. Ranell Patrick for evaluation of rectal bleeding. On several occasions he's had limited rectal bleeding consisting of bright red blood on the toilet tissue and occasionally in the toilet water. He tends to be constipated.  He was seen in the ER  in July, 2013 for bleeding; hemoglobin was 14.1. He denies abdominal or rectal pain. Stool frequency has improved with the addition of fiber.  Patient has a history of pancreatitis related to alcohol abuse. Denies use of hard liquor now but admits to drinking beer and wine weekly.    Past Medical History  Diagnosis Date  . Ureteral stent displacement 1990  . Nephrolithiasis   . Hypertension   . Pancreatitis     Hx of   . Kidney stones   . Hyperlipemia   . GERD (gastroesophageal reflux disease)   . Alcohol abuse    Past Surgical History  Procedure Date  . Ureteroscopy     and stone retreval  . A2 pulley flexor sheath cyst 4th finger excisional biopsy     '03   family history includes Diabetes in his mother; Heart disease in his father; and Hypertension in his father. Current Outpatient Prescriptions  Medication Sig Dispense Refill  . amLODipine-benazepril (LOTREL) 5-20 MG per capsule Take 1 capsule by mouth daily.  30 capsule  5  . cyclobenzaprine (FLEXERIL) 5 MG tablet Take 1/2 tablet every 6 hours as needed for muscle spasm.  30 tablet  2  . furosemide (LASIX) 20 MG tablet Take 1 tablet (20 mg total) by mouth daily.  30 tablet  5  . omeprazole (PRILOSEC) 20 MG capsule Take one tab PO twice a day for 3 days, then one tab PO once a day  30 capsule  6  . ranitidine (ZANTAC) 150 MG tablet TAKE ONE TABLET BY MOUTH TWICE DAILY FOR  STOMACH  ACID  60 tablet  2  . traMADol (ULTRAM) 50 MG tablet Take 50 mg by mouth every 6 (six) hours as needed.       Allergies as of 12/10/2011  . (No Known Allergies)    reports  that he has quit smoking. He has never used smokeless tobacco. He reports that he drinks about 18 ounces of alcohol per week. He reports that he does not use illicit drugs.     Review of Systems: Pertinent positive and negative review of systems were noted in the above HPI section. All other review of systems were otherwise negative.  Vital signs were reviewed in today's medical record Physical Exam: General: Well developed , well nourished, no acute distress Head: Normocephalic and atraumatic Eyes:  sclerae anicteric, EOMI Ears: Normal auditory acuity Mouth: No deformity or lesions Neck: Supple, no masses or thyromegaly Lungs: Clear throughout to auscultation Heart: Regular rate and rhythm; no murmurs, rubs or bruits Abdomen: Soft, non tender and non distended. No masses, hepatosplenomegaly or hernias noted. Normal Bowel sounds Rectal:deferred Musculoskeletal: Symmetrical with no gross deformities  Skin: No lesions on visible extremities Pulses:  Normal pulses noted Extremities: No clubbing, cyanosis, edema or deformities noted Neurological: Alert oriented x 4, grossly nonfocal Cervical Nodes:  No significant cervical adenopathy Inguinal Nodes: No significant inguinal adenopathy Psychological:  Alert and cooperative. Normal mood and affect

## 2011-12-10 NOTE — Assessment & Plan Note (Signed)
Limited rectal bleeding may be due to hemorrhoids. A more proximal colonic bleeding source should be ruled out.  Recommendations #1 colonoscopy-this will be scheduled with Dr. Marina Goodell

## 2011-12-12 ENCOUNTER — Ambulatory Visit: Payer: BC Managed Care – PPO | Admitting: Gastroenterology

## 2011-12-20 ENCOUNTER — Ambulatory Visit: Payer: BC Managed Care – PPO | Admitting: Internal Medicine

## 2011-12-30 ENCOUNTER — Other Ambulatory Visit: Payer: Self-pay | Admitting: Internal Medicine

## 2012-01-01 ENCOUNTER — Ambulatory Visit (AMBULATORY_SURGERY_CENTER): Payer: BC Managed Care – PPO | Admitting: Internal Medicine

## 2012-01-01 ENCOUNTER — Encounter: Payer: Self-pay | Admitting: Internal Medicine

## 2012-01-01 VITALS — BP 150/99 | HR 74 | Temp 98.3°F | Resp 17 | Ht 72.0 in | Wt 229.0 lb

## 2012-01-01 DIAGNOSIS — K921 Melena: Secondary | ICD-10-CM

## 2012-01-01 DIAGNOSIS — Z1211 Encounter for screening for malignant neoplasm of colon: Secondary | ICD-10-CM

## 2012-01-01 DIAGNOSIS — K5730 Diverticulosis of large intestine without perforation or abscess without bleeding: Secondary | ICD-10-CM

## 2012-01-01 MED ORDER — SODIUM CHLORIDE 0.9 % IV SOLN: 500.0000 mL | INTRAVENOUS | Status: DC

## 2012-01-01 NOTE — Progress Notes (Signed)
Patient did not experience any of the following events: a burn prior to discharge; a fall within the facility; wrong site/side/patient/procedure/implant event; or a hospital transfer or hospital admission upon discharge from the facility. (G8907) Patient did not have preoperative order for IV antibiotic SSI prophylaxis. (G8918)  

## 2012-01-01 NOTE — Op Note (Signed)
[facility] [address] [place], [address]   COLONOSCOPY PROCEDURE REPORT  PATIENT: [name], [name]  MR#: [id number] BIRTHDATE: Apr 17, 1961 , 50  yrs. old GENDER: Male ENDOSCOPIST: Roxy Cedar, MD REFERRED XB:[id number] Esther Hardy, M.D. PROCEDURE DATE:  01/01/2012 PROCEDURE:   Colonoscopy, screening    ; minor rectal bleeding ASA CLASS:   Class II INDICATIONS:average risk patient for colon cancer. MEDICATIONS: MAC sedation, administered by CRNA and propofol (Diprivan) 450mg  IV  DESCRIPTION OF PROCEDURE:   After the risks benefits and alternatives of the procedure were thoroughly explained, informed consent was obtained.  A digital rectal exam revealed no abnormalities of the rectum.   The LB CF-[id number]  endoscope was introduced through the anus and advanced to the cecum, which was identified by both the appendix and ileocecal valve. No adverse events experienced.   The quality of the prep was excellent, using MoviPrep  The instrument was then slowly withdrawn as the colon was fully examined.      COLON FINDINGS: Moderate diverticulosis was noted  in the right and left colon.   The colon mucosa was otherwise normal.  Retroflexed views revealed internal hemorrhoids. The time to cecum=4 minutes 10 seconds.  Withdrawal time=9 minutes 22 seconds.  The scope was withdrawn and the procedure completed.  COMPLICATIONS: There were no complications.  ENDOSCOPIC IMPRESSION: 1. Moderate diverticulosis was noted in the right and left colon 2. Otherwise normal exam  RECOMMENDATIONS: 1.Continue current colorectal screening recommendations for "routine risk" patients with a repeat colonoscopy in 10 years.  eSigned:  Roxy Cedar, MD 01/01/2012 8:40 AM   cc: Jacques Navy, MD and The Patient

## 2012-01-01 NOTE — Patient Instructions (Addendum)
Handouts were given to your care partner on diverticulosis and high fiber diet.  You may resume your prior medications today.  Please call if any questions or concerns.    YOU HAD AN ENDOSCOPIC PROCEDURE TODAY AT THE [facility]: Refer to the procedure report that was given to you for any specific questions about what was found during the examination.  If the procedure report does not answer your questions, please call your gastroenterologist to clarify.  If you requested that your care partner not be given the details of your procedure findings, then the procedure report has been included in a sealed envelope for you to review at your convenience later.  YOU SHOULD EXPECT: Some feelings of bloating in the abdomen. Passage of more gas than usual.  Walking can help get rid of the air that was put into your GI tract during the procedure and reduce the bloating. If you had a lower endoscopy (such as a colonoscopy or flexible sigmoidoscopy) you may notice spotting of blood in your stool or on the toilet paper. If you underwent a bowel prep for your procedure, then you may not have a normal bowel movement for a few days.  DIET: Your first meal following the procedure should be a light meal and then it is ok to progress to your normal diet.  A half-sandwich or bowl of soup is an example of a good first meal.  Heavy or fried foods are harder to digest and may make you feel nauseous or bloated.  Likewise meals heavy in dairy and vegetables can cause extra gas to form and this can also increase the bloating.  Drink plenty of fluids but you should avoid alcoholic beverages for 24 hours.  ACTIVITY: Your care partner should take you home directly after the procedure.  You should plan to take it easy, moving slowly for the rest of the day.  You can resume normal activity the day after the procedure however you should NOT DRIVE or use heavy machinery for 24 hours (because of the sedation medicines used  during the test).    SYMPTOMS TO REPORT IMMEDIATELY: A gastroenterologist can be reached at any hour.  During normal business hours, 8:30 AM to 5:00 PM Monday through Friday, call [phone].  After hours and on weekends, please call the GI answering service at [phone] who will take a message and have the physician on call contact you.   Following lower endoscopy (colonoscopy or flexible sigmoidoscopy):  Excessive amounts of blood in the stool  Significant tenderness or worsening of abdominal pains  Swelling of the abdomen that is new, acute  Fever of 100F or higher    FOLLOW UP: If any biopsies were taken you will be contacted by phone or by letter within the next 1-3 weeks.  Call your gastroenterologist if you have not heard about the biopsies in 3 weeks.  Our staff will call the home number listed on your records the next business day following your procedure to check on you and address any questions or concerns that you may have at that time regarding the information given to you following your procedure. This is a courtesy call and so if there is no answer at the home number and we have not heard from you through the emergency physician on call, we will assume that you have returned to your regular daily activities without incident.  SIGNATURES/CONFIDENTIALITY: You and/or your care partner have signed paperwork which will be entered into your   electronic medical record.  These signatures attest to the fact that that the information above on your After Visit Summary has been reviewed and is understood.  Full responsibility of the confidentiality of this discharge information lies with you and/or your care-partner.  

## 2012-01-01 NOTE — Progress Notes (Signed)
No complaints noted in the recovery room. Maw   

## 2012-01-01 NOTE — Progress Notes (Signed)
I advised the pt to go home and take his bolod pressure medications d/t high blood pressure.  He said he did not take it this am. [name]

## 2012-01-02 ENCOUNTER — Telehealth: Payer: Self-pay | Admitting: *Deleted

## 2012-01-02 NOTE — Telephone Encounter (Signed)
  Follow up Call-  Call back number 01/01/2012  Post procedure Call Back phone  # [phone]  Permission to leave phone message Yes     Left message.

## 2012-01-03 ENCOUNTER — Encounter: Payer: BC Managed Care – PPO | Admitting: Internal Medicine

## 2012-01-03 DIAGNOSIS — Z0289 Encounter for other administrative examinations: Secondary | ICD-10-CM

## 2012-01-08 ENCOUNTER — Other Ambulatory Visit: Payer: Self-pay | Admitting: Internal Medicine

## 2012-01-08 NOTE — Telephone Encounter (Signed)
The pt called and is hoping to get his pain medicine increased.  He states the 50mg  tramadol is not helping with his knee pain.  He has re-scheduled his cpe for the middle of September.    Thanks!

## 2012-01-09 NOTE — Telephone Encounter (Signed)
May increases tramadol ot 100 mg tid. Tramadol 50 mg sig 2 tid, #180, refill x 1

## 2012-01-24 ENCOUNTER — Other Ambulatory Visit (INDEPENDENT_AMBULATORY_CARE_PROVIDER_SITE_OTHER): Payer: BC Managed Care – PPO

## 2012-01-24 ENCOUNTER — Ambulatory Visit (INDEPENDENT_AMBULATORY_CARE_PROVIDER_SITE_OTHER): Payer: BC Managed Care – PPO | Admitting: Internal Medicine

## 2012-01-24 ENCOUNTER — Encounter: Payer: BC Managed Care – PPO | Admitting: Internal Medicine

## 2012-01-24 ENCOUNTER — Encounter: Payer: Self-pay | Admitting: Internal Medicine

## 2012-01-24 VITALS — BP 124/82 | HR 90 | Temp 98.4°F | Resp 16 | Wt 228.0 lb

## 2012-01-24 DIAGNOSIS — M25562 Pain in left knee: Secondary | ICD-10-CM

## 2012-01-24 DIAGNOSIS — N400 Enlarged prostate without lower urinary tract symptoms: Secondary | ICD-10-CM

## 2012-01-24 DIAGNOSIS — K219 Gastro-esophageal reflux disease without esophagitis: Secondary | ICD-10-CM

## 2012-01-24 DIAGNOSIS — Z0000 Encounter for general adult medical examination without abnormal findings: Secondary | ICD-10-CM

## 2012-01-24 DIAGNOSIS — Z23 Encounter for immunization: Secondary | ICD-10-CM

## 2012-01-24 DIAGNOSIS — M25569 Pain in unspecified knee: Secondary | ICD-10-CM

## 2012-01-24 DIAGNOSIS — I10 Essential (primary) hypertension: Secondary | ICD-10-CM

## 2012-01-24 DIAGNOSIS — E781 Pure hyperglyceridemia: Secondary | ICD-10-CM

## 2012-01-24 DIAGNOSIS — Z13220 Encounter for screening for lipoid disorders: Secondary | ICD-10-CM

## 2012-01-24 LAB — LIPID PANEL
Cholesterol: 196 mg/dL (ref 0–200)
HDL: 57 mg/dL (ref >39.00)
Total CHOL/HDL Ratio: 3
Triglycerides: 302 mg/dL — ABNORMAL HIGH (ref 0.0–149.0)
VLDL: 60.4 mg/dL — ABNORMAL HIGH (ref 0.0–40.0)

## 2012-01-24 LAB — COMPREHENSIVE METABOLIC PANEL
ALT: 43 U/L (ref 0–53)
AST: 34 U/L (ref 0–37)
Alkaline Phosphatase: 32 U/L — ABNORMAL LOW (ref 39–117)
BUN: 18 mg/dL (ref 6–23)
Chloride: 105 mEq/L (ref 96–112)
Creatinine, Ser: 1.2 mg/dL (ref 0.4–1.5)
Glucose, Bld: 104 mg/dL — ABNORMAL HIGH (ref 70–99)
Total Bilirubin: 1 mg/dL (ref 0.3–1.2)

## 2012-01-24 LAB — PSA: PSA: 0.43 ng/mL (ref 0.10–4.00)

## 2012-01-24 MED ORDER — DICLOFENAC EPOLAMINE 1.3 % TD PTCH: 1.0000 | MEDICATED_PATCH | Freq: Two times a day (BID) | TRANSDERMAL | Status: DC

## 2012-01-24 NOTE — Patient Instructions (Addendum)
Pain at medial aspect of the knee a tendon inflammation. The knee joint is looking good on x-ray. Plan Flector patch applied to the area twice a day.  If not improved in several days will refer you to [facility] to a sports medicine purpose  General health is good. Will check cholesterol, kidney function, sugar and PSA today.  Tdap - good for 10 years.   Tendinitis Tendinitis is swelling and inflammation of the tendons. Tendons are band-like tissues that connect muscle to bone. Tendinitis commonly occurs in the:    Shoulders (rotator cuff).   Heels (Achilles tendon).   Elbows (triceps tendon).  CAUSES Tendinitis is usually caused by overusing the tendon, muscles, and joints involved. When the tissue surrounding a tendon (synovium) becomes inflamed, it is called tenosynovitis. Tendinitis commonly develops in people whose jobs require repetitive motions. SYMPTOMS  Pain.   Tenderness.   Mild swelling.  DIAGNOSIS Tendinitis is usually diagnosed by physical exam. Your caregiver may also order X-rays or other imaging tests. TREATMENT Your caregiver may recommend certain medicines or exercises for your treatment. HOME CARE INSTRUCTIONS    Use a sling or splint for as long as directed by your caregiver until the pain decreases.   Put ice on the injured area.   Put ice in a plastic bag.   Place a towel between your skin and the bag.   Leave the ice on for 15 to 20 minutes, 3 to 4 times a day.   Avoid using the limb while the tendon is painful. Perform gentle range of motion exercises only as directed by your caregiver. Stop exercises if pain or discomfort increase, unless directed otherwise by your caregiver.   Only take over-the-counter or prescription medicines for pain, discomfort, or fever as directed by your caregiver.  SEEK MEDICAL CARE IF:    Your pain and swelling increase.   You develop new, unexplained symptoms, especially increased numbness in the  hands.  MAKE SURE YOU:    Understand these instructions.   Will watch your condition.   Will get help right away if you are not doing well or get worse.  Document Released: 04/20/2000 Document Revised: 04/12/2011 Document Reviewed: 07/10/2010 [facility] Patient Information 2012 [place], [place].

## 2012-01-25 ENCOUNTER — Other Ambulatory Visit: Payer: Self-pay | Admitting: *Deleted

## 2012-01-25 MED ORDER — DICLOFENAC SODIUM 1 % TD GEL: TRANSDERMAL | Status: DC

## 2012-01-25 NOTE — Telephone Encounter (Signed)
Medication called to [facility] due to medication  flector gel would require a prior authorization

## 2012-01-27 DIAGNOSIS — Z0000 Encounter for general adult medical examination without abnormal findings: Secondary | ICD-10-CM | POA: Insufficient documentation

## 2012-01-27 DIAGNOSIS — E785 Hyperlipidemia, unspecified: Secondary | ICD-10-CM | POA: Insufficient documentation

## 2012-01-27 DIAGNOSIS — E781 Pure hyperglyceridemia: Secondary | ICD-10-CM | POA: Insufficient documentation

## 2012-01-27 NOTE — Assessment & Plan Note (Signed)
BP Readings from Last 3 Encounters:  01/24/12 124/82  01/01/12 150/99  12/10/11 118/84   Good control on present medications. Labs reveal normal Potassium and renal function  Plan  Continue present medication

## 2012-01-27 NOTE — Assessment & Plan Note (Signed)
Interval history is negative except for knee pain. Physical exam is normal.Lab results are normal except for moderately elevated triglycerides. He is current with immunizations.  In summary - a pleasant man who is medically stable except for tenosynovitis left knee. He will return as needed or in 1 year.

## 2012-01-27 NOTE — Assessment & Plan Note (Signed)
Tendonitis medial aspect of left knee at tibial insertion. No limitation in ROM at the joint.  Plan -  topical NSAID, e.g flector patch or voltaren gel  If no improvement referral to sports medicine

## 2012-01-27 NOTE — Assessment & Plan Note (Signed)
Stable at today's exam. He continues on omeprazole AM and Ranitidine PM. He is current with Dr. Marina Goodell.

## 2012-01-27 NOTE — Progress Notes (Signed)
Subjective:    Patient ID: [name], male    DOB: [birth year], 51 y.o.   MRN: [id number]  HPI Mr. Starzyk presents for an annual medical exam. He has had a good year w/o major illness or injury. He does report that he has been having pain at the medial aspect of his left knee. He has been limited in his activities due to discomfort. There was no predisposing injury or overuse. He has no prior h/o knee problems. There is no click or crepitus, the knee has not given way on him. He has had no relief with otc pain medications.   Past Medical History  Diagnosis Date  . Ureteral stent displacement 1990  . Nephrolithiasis   . Hypertension   . Pancreatitis     Hx of   . Kidney stones   . Hyperlipemia   . GERD (gastroesophageal reflux disease)   . Alcohol abuse   . Arthritis    Past Surgical History  Procedure Date  . Ureteroscopy     and stone retreval  . A2 pulley flexor sheath cyst 4th finger excisional biopsy     '03  . Anterior cruciate ligament repair     R knee   Family History  Problem Relation Age of Onset  . Hypertension Father     X4 siblings  . Diabetes Mother     x2 brothers  . Heart disease Father    History   Social History  . Marital Status: Married    Spouse Name: N/A    Number of Children: 3  . Years of Education: N/A   Occupational History  .  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 Smoking status: Former Games developer  . Smokeless tobacco: Never Used  . Alcohol Use: 18.0 oz/week    15 Glasses of wine, 15 Cans of beer per week  . Drug Use: No  . Sexually Active: Yes   Other Topics Concern  . 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 amLODipine-benazepril (LOTREL)  5-20 MG per capsule TAKE ONE CAPSULE BY MOUTH EVERY DAY  30 capsule  1  . cyclobenzaprine (FLEXERIL) 5 MG tablet Take 1/2 tablet every 6 hours as needed for muscle spasm.  30 tablet  2  . furosemide (LASIX) 20 MG tablet TAKE ONE TABLET BY MOUTH EVERY DAY  30 tablet  1  . omeprazole (PRILOSEC) 20 MG capsule Take one tab PO twice a day for 3 days, then one tab PO once a day  30 capsule  6  . ranitidine (ZANTAC) 150 MG tablet TAKE ONE TABLET BY MOUTH TWICE DAILY FOR  STOMACH  ACID  60 tablet  2  . traMADol (ULTRAM) 50 MG tablet 50 mg. TAKE 2 TABLETS TID FOR PAIN IN KNEE          Review of Systems Constitutional:  Negative for fever, chills, activity change and unexpected weight change.  HEENT:  Negative for hearing loss, ear pain, congestion, neck stiffness and postnasal drip. Negative for sore throat or swallowing problems. Negative for dental complaints.   Eyes: Negative for vision loss or change in visual acuity.  Respiratory: Negative for chest tightness and wheezing.  Negative for DOE.   Cardiovascular: Negative for chest pain or palpitations. No decreased exercise tolerance Gastrointestinal: No change in bowel habit. No bloating or gas. No reflux or indigestion Genitourinary: Negative for urgency, frequency, flank pain and difficulty urinating.  Musculoskeletal: Negative for myalgias, back pain, arthralgias and gait problem except for pain in the left knee area.  Neurological: Negative for dizziness, tremors, weakness and headaches.  Hematological: Negative for adenopathy.  Psychiatric/Behavioral: Negative for behavioral problems and dysphoric mood.       Objective:   Physical Exam Filed Vitals:   01/24/12 1429  BP: 124/82  Pulse: 90  Temp: 98.4 F (36.9 C)  Resp: 16   Wt Readings from Last 3 Encounters:  01/24/12 228 lb (103.42 kg)  01/01/12 229 lb (103.874 kg)  12/10/11 227 lb (102.967 kg)   Gen'l: Well nourished well developed AA male in no acute distress  HEENT: Head:  Normocephalic and atraumatic. Right Ear: External ear normal. EAC w/ cerumen/TM nl. Left Ear: External ear normal.  EAC w/ cerumen/TM nl. Nose: Nose normal. Mouth/Throat: Oropharynx is clear and moist. Dentition - native, in good repair. No buccal or palatal lesions. Posterior pharynx clear. Eyes: Conjunctivae and sclera clear. EOM intact. Pupils are equal, round, and reactive to light. Right eye exhibits no discharge. Left eye exhibits no discharge. Neck: Normal range of motion. Neck supple. No JVD present. No tracheal deviation present. No thyromegaly present.  Cardiovascular: Normal rate, regular rhythm, no gallop, no friction rub, no murmur heard.      Quiet precordium. 2+ radial and DP pulses . No carotid bruits Pulmonary/Chest: Effort normal. No respiratory distress or increased WOB, no wheezes, no rales. No chest wall deformity or CVAT. Abdomen: Soft. Bowel sounds are normal in all quadrants. He exhibits no distension, no tenderness, no rebound or guarding, No heptosplenomegaly  Genitourinary: deferred  Musculoskeletal: Normal range of motion. He exhibits no edema and no tenderness.       Small and large joints without redness, synovial thickening or deformity. Full range of motion preserved about all small, median and large joints. Point of maximum tenderness at the medial aspect of the left knee at the tendonous insertion at the tibia. Left knee w/o crepitus, click or instability. Minor tenderness to palpation just medial and inferior to the patella left knee. Lymphadenopathy:    He has no cervical or supraclavicular adenopathy.  Neurological: He is alert and oriented to person, place, and time. CN II-XII intact. DTRs 2+ and symmetrical biceps, radial and patellar tendons. Cerebellar function normal with no tremor, rigidity, normal gait and station.  Skin: Skin is warm and dry. No rash noted. No erythema.  Psychiatric: He has a normal mood and affect. His behavior is normal. Thought content  normal.   Lab Results  Component Value Date   WBC 8.5 11/12/2011   HGB 14.1 11/12/2011   HCT 42.0 11/12/2011   PLT 245.0 11/12/2011   GLUCOSE 104 01/24/2012   CHOL 196 01/24/2012   TRIG 302.0 01/24/2012   HDL 57.00 01/24/2012   LDLDIRECT 107.5 01/24/2012   ALT 43 01/24/2012   AST 34 01/24/2012   NA 140 01/24/2012   K 3.6 01/24/2012   CL 105 01/24/2012   CREATININE 1.2 01/24/2012   BUN 18 01/24/2012   CO2 27 01/24/2012   TSH 0.90 06/08/2010   PSA 0.43 01/24/2012   INR 0.95 11/21/2010         Assessment & Plan:

## 2012-01-27 NOTE — Assessment & Plan Note (Signed)
Tgy = 302 (Sept '13) in patient with h/o pancreatitis.  Plan -  Life-style management: low fat diet  F/u lab in 3 months

## 2012-01-28 ENCOUNTER — Telehealth: Payer: Self-pay | Admitting: Internal Medicine

## 2012-01-28 ENCOUNTER — Other Ambulatory Visit: Payer: Self-pay | Admitting: *Deleted

## 2012-01-28 NOTE — Telephone Encounter (Signed)
Even with approval patient prefers oral diclofenac 75 mg bid, #30, 1 refill. GI precautions need to be given to patient. He should still use heat and lineament of choice.

## 2012-01-28 NOTE — Telephone Encounter (Signed)
Pt is requesting diclofenac sodium tablets 75mg  instead of the gel.  His pharmacy told him this would be much cheaper.  He uses [facility] on [address] [phone]).

## 2012-01-28 NOTE — Telephone Encounter (Signed)
PA APROVAL FOR VOTAREN GEL SENT TO PHARMACY. [facility]

## 2012-01-28 NOTE — Telephone Encounter (Signed)
Medication prior authorization approval for Voltaren 1 % gel to be faxed to office

## 2012-01-30 ENCOUNTER — Telehealth: Payer: Self-pay | Admitting: Internal Medicine

## 2012-01-30 MED ORDER — DICLOFENAC SODIUM 75 MG PO TBEC: 75.0000 mg | DELAYED_RELEASE_TABLET | Freq: Two times a day (BID) | ORAL | Status: DC

## 2012-01-30 NOTE — Addendum Note (Signed)
Addended by: Elnora Morrison on: 01/30/2012 01:58 PM   Modules accepted: Orders

## 2012-01-30 NOTE — Telephone Encounter (Signed)
Medication sent to pharmacy [facility] in [place] Diclofenac 75mg  tablet one bid

## 2012-01-30 NOTE — Telephone Encounter (Signed)
Caller: Ranger/Patient; Patient Name: [name]; PCP: Sanda Linger; Best Callback Phone Number: [phone].  Pt is upset because he states he has called several times regarding pain medication for tendonitis.  Pt was seen in the office 01/24/12 and still has not had anything for pain.  RN verified notes in EPIC, Dr Debby Bud has approved Diclofenac 75 mg po BID Dispense # 30, 1 refill. RN called in medication to [facility] [phone]).  Message left on pharmacy voicemail.

## 2012-03-06 ENCOUNTER — Other Ambulatory Visit: Payer: Self-pay | Admitting: Internal Medicine

## 2012-04-18 ENCOUNTER — Other Ambulatory Visit: Payer: Self-pay | Admitting: *Deleted

## 2012-04-18 MED ORDER — RANITIDINE HCL 150 MG PO TABS: 150.0000 mg | ORAL_TABLET | Freq: Two times a day (BID) | ORAL | Status: DC

## 2012-05-01 ENCOUNTER — Ambulatory Visit (INDEPENDENT_AMBULATORY_CARE_PROVIDER_SITE_OTHER): Payer: BC Managed Care – PPO | Admitting: Internal Medicine

## 2012-05-01 ENCOUNTER — Encounter: Payer: Self-pay | Admitting: Internal Medicine

## 2012-05-01 VITALS — BP 140/100 | HR 80 | Temp 98.8°F | Resp 16 | Wt 223.0 lb

## 2012-05-01 DIAGNOSIS — J019 Acute sinusitis, unspecified: Secondary | ICD-10-CM

## 2012-05-01 DIAGNOSIS — T148XXA Other injury of unspecified body region, initial encounter: Secondary | ICD-10-CM

## 2012-05-01 MED ORDER — CYCLOBENZAPRINE HCL 5 MG PO TABS: ORAL_TABLET | ORAL | Status: DC

## 2012-05-01 MED ORDER — AZITHROMYCIN 250 MG PO TABS: ORAL_TABLET | ORAL | Status: DC

## 2012-05-01 NOTE — Patient Instructions (Addendum)

## 2012-05-01 NOTE — Progress Notes (Signed)
Subjective:    Patient ID: [name], male    DOB: [birth year], 51 y.o.   MRN: [id number]  HPI  Pt presents to the clinic today with c/o cold symptoms. This started 1 week ago. He is having headaches, nasal congestion, sore throat, cough and chest congestion. He has been taken [place] HBP but he feels like it is increasing his blood pressure. His blood pressure today is elevated. Nothing is making the symptoms worse but they are lingering.  Additionally, he has shoulder pain from an injury 2 weeks ago. The pain is constant but worse when he lifts his arm or reaches for an object. He denies andy weakness in the arm just pain. He has bben taking Diclofenac and Flexeril for the pain which has helped.  Review of Systems      Past Medical History  Diagnosis Date  . Ureteral stent displacement 1990  . Nephrolithiasis   . Hypertension   . Pancreatitis     Hx of   . Kidney stones   . Hyperlipemia   . GERD (gastroesophageal reflux disease)   . Alcohol abuse   . Arthritis      No Known Allergies  Family History  Problem Relation Age of Onset  . Hypertension Father     X4 siblings  . Diabetes Mother     x2 brothers  . Heart disease Father     History   Social History  . Marital Status: Married    Spouse Name: N/A    Number of Children: 3  . Years of Education: N/A   Occupational History  .  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 Smoking status: Former Games developer  . Smokeless tobacco: Never Used  . Alcohol Use: 18.0 oz/week    15 Glasses of wine, 15 Cans of beer per week  . Drug Use: No  . Sexually Active: Yes   Other Topics Concern  . 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 Denies fever, malaise, fatigue, or abrupt weight  changes.  HEENT: Pt reports runny nose and sore throat. Denies eye pain, eye redness, ear pain, ringing in the ears, wax buildup, nasal congestion, bloody nose. Respiratory: Denies difficulty breathing, shortness of breath, cough or sputum production.   Cardiovascular: Denies chest pain, chest tightness, palpitations or swelling in the hands or feet.  Neurological: Denies dizziness, difficulty with memory, difficulty with speech or problems with balance and coordination.   No other specific complaints in a complete review of systems (except as listed in HPI above).  Objective:   Physical Exam  BP 140/100  Pulse 80  Temp 98.8 F (37.1 C) (Oral)  Resp 16  Wt 223 lb (101.152 kg) Wt Readings from Last 3 Encounters:  05/01/12 223 lb (101.152 kg)  01/24/12 228 lb (103.42 kg)  01/01/12 229 lb (103.874 kg)    General: Appears his stated age, well developed, well nourished in NAD. Skin: Warm, dry and intact. No rashes, lesions or ulcerations noted. HEENT: Head: normal shape and size; Eyes: sclera white, no icterus, conjunctiva pink, PERRLA and EOMs intact; Ears: Tm's gray and intact, normal light reflex; Nose: mucosa pink and moist, septum midline; Throat/Mouth: Teeth present, mucosa erythematous and moist, + PND, no exudate,  lesions or ulcerations noted.  Neck: Normal range of motion. Neck supple, trachea midline. No massses, lumps or thyromegaly present.  Cardiovascular: Normal rate and rhythm. S1,S2 noted.  No murmur, rubs or gallops noted. No JVD or BLE edema. No carotid bruits noted. Pulmonary/Chest: Normal effort and scattered rhonchi throuhout. No respiratory distress. No wheezes, rales noted.  Musculoskeletal: Normal range of motion. No signs of joint swelling. No difficulty with gait.  Neurological: Alert and oriented. Cranial nerves II-XII intact. Coordination normal. +DTRs bilaterally.       Assessment & Plan:   Acute Bronchitis:  Azithromax x 5 days Hycodan cough syrup for  night Get plenty of rest and stay well hydrated  Muscle strain of shoulder  Continue taking diclofenac and flexeril RTC as needed or if symptoms persist

## 2012-05-02 ENCOUNTER — Other Ambulatory Visit: Payer: Self-pay | Admitting: Internal Medicine

## 2012-05-02 ENCOUNTER — Telehealth: Payer: Self-pay | Admitting: Internal Medicine

## 2012-05-02 DIAGNOSIS — R05 Cough: Secondary | ICD-10-CM

## 2012-05-02 MED ORDER — HYDROCODONE-HOMATROPINE 5-1.5 MG/5ML PO SYRP: 5.0000 mL | ORAL_SOLUTION | Freq: Three times a day (TID) | ORAL | Status: DC | PRN

## 2012-05-02 NOTE — Telephone Encounter (Signed)
Patient thought we were going to call him in some hydrocodone cough syrup and the pharmacy never received it, he uses the [facility] on [address]

## 2012-05-02 NOTE — Telephone Encounter (Signed)
[place], RX faxed to pharmacy. Thx Rene Kocher

## 2012-05-02 NOTE — Telephone Encounter (Signed)
Patient notified via voice message.

## 2012-07-05 ENCOUNTER — Emergency Department (HOSPITAL_COMMUNITY)
Admission: EM | Admit: 2012-07-05 | Discharge: 2012-07-05 | Disposition: A | Discharge status: Home / Self Care | Payer: BC Managed Care – PPO | Attending: Emergency Medicine | Admitting: Emergency Medicine

## 2012-07-05 DIAGNOSIS — Z87448 Personal history of other diseases of urinary system: Secondary | ICD-10-CM | POA: Insufficient documentation

## 2012-07-05 DIAGNOSIS — K219 Gastro-esophageal reflux disease without esophagitis: Secondary | ICD-10-CM | POA: Insufficient documentation

## 2012-07-05 DIAGNOSIS — Z87442 Personal history of urinary calculi: Secondary | ICD-10-CM | POA: Insufficient documentation

## 2012-07-05 DIAGNOSIS — I10 Essential (primary) hypertension: Secondary | ICD-10-CM | POA: Insufficient documentation

## 2012-07-05 DIAGNOSIS — Z8719 Personal history of other diseases of the digestive system: Secondary | ICD-10-CM | POA: Insufficient documentation

## 2012-07-05 DIAGNOSIS — K649 Unspecified hemorrhoids: Secondary | ICD-10-CM | POA: Insufficient documentation

## 2012-07-05 DIAGNOSIS — Z79899 Other long term (current) drug therapy: Secondary | ICD-10-CM | POA: Insufficient documentation

## 2012-07-05 DIAGNOSIS — K625 Hemorrhage of anus and rectum: Secondary | ICD-10-CM

## 2012-07-05 DIAGNOSIS — K921 Melena: Secondary | ICD-10-CM | POA: Insufficient documentation

## 2012-07-05 DIAGNOSIS — Z862 Personal history of diseases of the blood and blood-forming organs and certain disorders involving the immune mechanism: Secondary | ICD-10-CM | POA: Insufficient documentation

## 2012-07-05 DIAGNOSIS — R7989 Other specified abnormal findings of blood chemistry: Secondary | ICD-10-CM | POA: Insufficient documentation

## 2012-07-05 DIAGNOSIS — Z8639 Personal history of other endocrine, nutritional and metabolic disease: Secondary | ICD-10-CM | POA: Insufficient documentation

## 2012-07-05 DIAGNOSIS — Z87891 Personal history of nicotine dependence: Secondary | ICD-10-CM | POA: Insufficient documentation

## 2012-07-05 LAB — CBC WITH DIFFERENTIAL/PLATELET
Eosinophils Absolute: 0.1 10*3/uL (ref 0.0–0.7)
Eosinophils Relative: 1 % (ref 0–5)
HCT: 40.2 % (ref 39.0–52.0)
Hemoglobin: 13.7 g/dL (ref 13.0–17.0)
Lymphocytes Relative: 20 % (ref 12–46)
Lymphs Abs: 2.8 10*3/uL (ref 0.7–4.0)
MCH: 31.5 pg (ref 26.0–34.0)
MCV: 92.4 fL (ref 78.0–100.0)
Monocytes Absolute: 1 10*3/uL (ref 0.1–1.0)
Monocytes Relative: 7 % (ref 3–12)
Neutro Abs: 10.3 10*3/uL — ABNORMAL HIGH (ref 1.7–7.7)
RBC: 4.35 MIL/uL (ref 4.22–5.81)
WBC: 14.1 10*3/uL — ABNORMAL HIGH (ref 4.0–10.5)

## 2012-07-05 LAB — COMPREHENSIVE METABOLIC PANEL
ALT: 88 U/L — ABNORMAL HIGH (ref 0–53)
AST: 55 U/L — ABNORMAL HIGH (ref 0–37)
BUN: 10 mg/dL (ref 6–23)
CO2: 26 mEq/L (ref 19–32)
Calcium: 9.3 mg/dL (ref 8.4–10.5)
Creatinine, Ser: 0.84 mg/dL (ref 0.50–1.35)
GFR calc Af Amer: >90 mL/min (ref >90)
GFR calc non Af Amer: >90 mL/min (ref >90)
Glucose, Bld: 106 mg/dL — ABNORMAL HIGH (ref 70–99)
Total Protein: 7.4 g/dL (ref 6.0–8.3)

## 2012-07-05 LAB — OCCULT BLOOD, POC DEVICE: Fecal Occult Bld: POSITIVE — AB

## 2012-07-05 LAB — LIPASE, BLOOD: Lipase: 27 U/L (ref 11–59)

## 2012-07-05 MED ORDER — LORAZEPAM 2 MG/ML IJ SOLN
INTRAMUSCULAR | Status: AC
  Filled 2012-07-05: qty 1

## 2012-07-05 MED ORDER — SODIUM CHLORIDE 0.9 % IV BOLUS (SEPSIS)
1000.0000 mL | Freq: Once | INTRAVENOUS | Status: AC
  Administered 2012-07-05: 1000 mL via INTRAVENOUS

## 2012-07-05 MED ORDER — LORAZEPAM 2 MG/ML IJ SOLN
1.0000 mg | Freq: Once | INTRAMUSCULAR | Status: AC
  Administered 2012-07-05: 16:00:00 via INTRAVENOUS

## 2012-07-05 NOTE — ED Provider Notes (Signed)
History     CSN: [id number]  Arrival date & time 07/05/12  1303   First MD Initiated Contact with Patient 07/05/12 1355      Chief Complaint  Patient presents with  . Rectal Bleeding    (Consider location/radiation/quality/duration/timing/severity/associated sxs/prior treatment) HPI Comments: Pt states that she has a history of dark blood with stools and with wiping for the last 2 days:pt states that he had similar problems previously and he has a colonoscopy in 12/2011:pt states that he is not supposed to have another one for 10 years:pt denies dizziness  Patient is a 52 y.o. male presenting with hematochezia. The history is provided by the patient. No language interpreter was used.  Rectal Bleeding  The current episode started 2 days ago. The problem occurs continuously. The problem has been unchanged. The patient is experiencing no pain. The stool is described as soft. Associated symptoms include hemorrhoids. Pertinent negatives include no fever, no abdominal pain, no diarrhea, no nausea and no rectal pain. There were no sick contacts.    Past Medical History  Diagnosis Date  . Ureteral stent displacement 1990  . Nephrolithiasis   . Hypertension   . Pancreatitis     Hx of   . Kidney stones   . Hyperlipemia   . GERD (gastroesophageal reflux disease)   . Alcohol abuse   . Arthritis     Past Surgical History  Procedure Laterality Date  . Ureteroscopy      and stone retreval  . A2 pulley flexor sheath cyst 4th finger excisional biopsy      '03  . Anterior cruciate ligament repair      R knee    Family History  Problem Relation Age of Onset  . Hypertension Father     X4 siblings  . Diabetes Mother     x2 brothers  . Heart disease Father     History  Substance Use Topics  . Smoking status: Former Games developer  . Smokeless tobacco: Never Used  . Alcohol Use: 18.0 oz/week    15 Glasses of wine, 15 Cans of beer per week      Review of Systems  Constitutional:  Negative for fever.  Respiratory: Negative.   Cardiovascular: Negative.   Gastrointestinal: Positive for hematochezia and hemorrhoids. Negative for nausea, abdominal pain, diarrhea and rectal pain.    Allergies  Vicodin  Home Medications   Current Outpatient Rx  Name  Route  Sig  Dispense  Refill  . amLODipine-benazepril (LOTREL) 5-20 MG per capsule   Oral   Take 1 capsule by mouth every morning.         . cyclobenzaprine (FLEXERIL) 5 MG tablet      5 mg 2 (two) times daily as needed. Take 1/2 tablet every 6 hours as needed for muscle spasm.         . diclofenac sodium (VOLTAREN) 1 % GEL   Topical   Apply 1 g topically 2 (two) times daily as needed. To affected area 4 x daily for pain         . furosemide (LASIX) 20 MG tablet   Oral   Take 20 mg by mouth daily.         [name] omeprazole (PRILOSEC) 20 MG capsule   Oral   Take 20 mg by mouth daily.           BP 139/98  Pulse 88  Temp(Src) 98.8 F (37.1 C) (Oral)  Resp 16  SpO2 100%  Physical  Exam  Nursing note and vitals reviewed. Constitutional: He is oriented to person, place, and time. He appears well-developed and well-nourished.  HENT:  Head: Normocephalic and atraumatic.  Cardiovascular: Normal rate and regular rhythm.   Pulmonary/Chest: Effort normal and breath sounds normal.  Abdominal: Soft. Bowel sounds are normal. There is no tenderness.  Genitourinary:  Red stool noted:no hemorrhoid noted  Musculoskeletal: Normal range of motion.  Neurological: He is alert and oriented to person, place, and time.  Skin: Skin is dry.    ED Course  Procedures (including critical care time)  Labs Reviewed  CBC WITH DIFFERENTIAL - Abnormal; Notable for the following:    WBC 14.1 (*)    Neutro Abs 10.3 (*)    All other components within normal limits  COMPREHENSIVE METABOLIC PANEL - Abnormal; Notable for the following:    Glucose, Bld 106 (*)    AST 55 (*)    ALT 88 (*)    Alkaline Phosphatase 35 (*)     All other components within normal limits  OCCULT BLOOD, POC DEVICE - Abnormal; Notable for the following:    Fecal Occult Bld POSITIVE (*)    All other components within normal limits  LIPASE, BLOOD   No results found.   1. Rectal bleeding   2. Elevated LFTs       MDM  Pt has elevated lfts likely related to his etoh abuse:pt abdomen is benign:pt is not symptomatic here and vitals are stable:pt is okay to follow up        Teressa Lower, NP 07/05/12 1557

## 2012-07-05 NOTE — ED Notes (Signed)
Virinda EDNP made aware of CIWA score and this Clinical research associate given verbal order for medication to assist with reduction of effects of withdraws.  Shelly RN wittness.  Pt tolerated

## 2012-07-05 NOTE — ED Notes (Signed)
Pt admits to daily drinking 6 pack (24oz) beer and 1/2 pint daily. At times will also drink bottle of wine also. Pt admits to being out of work for one month on work related injury.  Family present.   Supportive

## 2012-07-05 NOTE — ED Notes (Signed)
He states he had a normal b.m. This Tues. With which he passed a sm. Amt. Dark red blood.  He states that since 0200 today, he has passed 4 b.m.'s, each with a quantity of dark red blood.  He is in no distress.

## 2012-07-06 NOTE — ED Provider Notes (Signed)
Medical screening examination/treatment/procedure(s) were performed by non-physician practitioner and as supervising physician I was immediately available for consultation/collaboration.   Suzi Roots, MD 07/06/12 [phone]

## 2012-07-07 ENCOUNTER — Other Ambulatory Visit (INDEPENDENT_AMBULATORY_CARE_PROVIDER_SITE_OTHER): Payer: BC Managed Care – PPO

## 2012-07-07 ENCOUNTER — Encounter: Payer: Self-pay | Admitting: *Deleted

## 2012-07-07 ENCOUNTER — Telehealth: Payer: Self-pay | Admitting: Internal Medicine

## 2012-07-07 ENCOUNTER — Ambulatory Visit (INDEPENDENT_AMBULATORY_CARE_PROVIDER_SITE_OTHER): Payer: BC Managed Care – PPO | Admitting: Gastroenterology

## 2012-07-07 VITALS — BP 150/100 | HR 92 | Ht 72.0 in | Wt 229.0 lb

## 2012-07-07 DIAGNOSIS — Z791 Long term (current) use of non-steroidal anti-inflammatories (NSAID): Secondary | ICD-10-CM | POA: Insufficient documentation

## 2012-07-07 DIAGNOSIS — K922 Gastrointestinal hemorrhage, unspecified: Secondary | ICD-10-CM

## 2012-07-07 LAB — CBC WITH DIFFERENTIAL/PLATELET
Basophils Absolute: 0 10*3/uL (ref 0.0–0.1)
Eosinophils Absolute: 0.1 10*3/uL (ref 0.0–0.7)
Eosinophils Relative: 0.7 % (ref 0.0–5.0)
Hemoglobin: 13.4 g/dL (ref 13.0–17.0)
Lymphocytes Relative: 31 % (ref 12.0–46.0)
Lymphs Abs: 2.9 10*3/uL (ref 0.7–4.0)
MCHC: 33.8 g/dL (ref 30.0–36.0)
MCV: 91.9 fl (ref 78.0–100.0)
Monocytes Absolute: 0.9 10*3/uL (ref 0.1–1.0)
Neutrophils Relative %: 58.7 % (ref 43.0–77.0)
Platelets: 215 10*3/uL (ref 150.0–400.0)
RBC: 4.32 Mil/uL (ref 4.22–5.81)
WBC: 9.5 10*3/uL (ref 4.5–10.5)

## 2012-07-07 NOTE — Progress Notes (Signed)
07/07/2012 [name] [id number] 01-13-1961   History of Present Illness: Patient is a pleasant 52 year old male who presents to our office today as ER follow-up for dark, heme positive stools.  Says that he started having maroon to black stools two or three times a day since Friday.  Went to the ED on Saturday and Hgb was 13.7 grams, which is stable from the past.  Rectal exam showed heme positive stools and he was told to follow up in our office.  He has been taking NSAID's a couple of times a day every day for the last several weeks for shoulder pain from possible rotator cuff tear.  Also takes excedrin about two times a week for headaches/migraines.  Is on once daily prilosec.  No abdominal pain, nausea, vomiting, SOB, CP, or dizziness.  *Just of note, WBC count in the ED was 14.1 (we are rechecking that today in the CBC).  Current Medications, Allergies, Past Medical History, Past Surgical History, Family History and Social History were reviewed in [employer] record.   Physical Exam: BP 150/100  Pulse 92  Ht 6' (1.829 m)  Wt 229 lb (103.874 kg)  BMI 31.05 kg/m2  SpO2 98% General: Well developed, black male in no acute distress Head: Normocephalic and atraumatic Eyes:  sclerae anicteric, conjunctiva pink  Ears: Normal auditory acuity Lungs: Clear throughout to auscultation Heart: Regular rate and rhythm Abdomen: Soft, non-tender and non-distended. No masses, no hepatomegaly. Normal bowel sounds. Rectal: Trace amount of light colored stools on exam glove.  Not checked for heme positivity.   Musculoskeletal: Symmetrical with no gross deformities  Extremities: No edema  Neurological: Alert oriented x 4, grossly nonfocal Psychological:  Alert and cooperative. Normal mood and affect  Assessment and Recommendations: -GIB with recent NSAID use.  Rule out ulcer disease vs other etiologies.  Looks like it is resolving.  Had colonoscopy 12/2011.  *EGD this  week. *CBC today. *NO further excedrin or NSAID's. *Increase prilosec to BID.

## 2012-07-07 NOTE — Progress Notes (Signed)
Reviewed and agree with management. Robert D. Kaplan, M.D., FACG  

## 2012-07-07 NOTE — Patient Instructions (Addendum)
Your physician has requested that you go to the basement for the following lab work before leaving today:  CBC   You have been scheduled for an endoscopy at [facility]. Please follow written instructions given to you at your visit today. If you use inhalers (even only as needed), please bring them with you on the day of your procedure.  You may increase your Prilosec to twice a day

## 2012-07-07 NOTE — Telephone Encounter (Signed)
Pt states that he started having blood in his stool on Friday night. Reports that his stool is very dark and the toilet bowl is colored red. Pt went to the ER Saturday and was told to follow-up with GI. Pt scheduled to see Doug Sou PA today at 2:15pm. Pt aware of appt date and time.

## 2012-07-08 ENCOUNTER — Encounter: Payer: Self-pay | Admitting: Gastroenterology

## 2012-07-09 ENCOUNTER — Other Ambulatory Visit: Payer: Self-pay | Admitting: *Deleted

## 2012-07-09 ENCOUNTER — Ambulatory Visit (HOSPITAL_COMMUNITY)
Admission: RE | Admit: 2012-07-09 | Discharge: 2012-07-09 | Disposition: A | Discharge status: Home / Self Care | Payer: BC Managed Care – PPO | Source: Ambulatory Visit | Attending: Gastroenterology | Admitting: Gastroenterology

## 2012-07-09 ENCOUNTER — Encounter (HOSPITAL_COMMUNITY)
Admission: RE | Disposition: A | Discharge status: Home / Self Care | Payer: Self-pay | Source: Ambulatory Visit | Attending: Gastroenterology

## 2012-07-09 ENCOUNTER — Encounter (HOSPITAL_COMMUNITY): Payer: Self-pay

## 2012-07-09 DIAGNOSIS — K319 Disease of stomach and duodenum, unspecified: Secondary | ICD-10-CM | POA: Insufficient documentation

## 2012-07-09 DIAGNOSIS — K921 Melena: Secondary | ICD-10-CM

## 2012-07-09 DIAGNOSIS — K2970 Gastritis, unspecified, without bleeding: Secondary | ICD-10-CM | POA: Insufficient documentation

## 2012-07-09 DIAGNOSIS — Z791 Long term (current) use of non-steroidal anti-inflammatories (NSAID): Secondary | ICD-10-CM

## 2012-07-09 DIAGNOSIS — K625 Hemorrhage of anus and rectum: Secondary | ICD-10-CM

## 2012-07-09 DIAGNOSIS — K922 Gastrointestinal hemorrhage, unspecified: Secondary | ICD-10-CM

## 2012-07-09 DIAGNOSIS — R195 Other fecal abnormalities: Secondary | ICD-10-CM | POA: Insufficient documentation

## 2012-07-09 DIAGNOSIS — K2990 Gastroduodenitis, unspecified, without bleeding: Secondary | ICD-10-CM | POA: Insufficient documentation

## 2012-07-09 HISTORY — PX: ESOPHAGOGASTRODUODENOSCOPY: SHX5428

## 2012-07-09 SURGERY — EGD (ESOPHAGOGASTRODUODENOSCOPY)
Anesthesia: Moderate Sedation

## 2012-07-09 MED ORDER — GLYCOPYRROLATE 0.2 MG/ML IJ SOLN
INTRAMUSCULAR | Status: DC | PRN
  Administered 2012-07-09: 0.2 mg via INTRAVENOUS

## 2012-07-09 MED ORDER — FENTANYL CITRATE 0.05 MG/ML IJ SOLN
INTRAMUSCULAR | Status: AC
  Filled 2012-07-09: qty 4

## 2012-07-09 MED ORDER — SODIUM CHLORIDE 0.9 % IV SOLN: INTRAVENOUS | Status: DC

## 2012-07-09 MED ORDER — MIDAZOLAM HCL 10 MG/2ML IJ SOLN
INTRAMUSCULAR | Status: DC | PRN
  Administered 2012-07-09 (×4): 2 mg via INTRAVENOUS

## 2012-07-09 MED ORDER — DIPHENHYDRAMINE HCL 50 MG/ML IJ SOLN
INTRAMUSCULAR | Status: DC | PRN
  Administered 2012-07-09 (×2): 25 mg via INTRAVENOUS

## 2012-07-09 MED ORDER — FENTANYL CITRATE 0.05 MG/ML IJ SOLN
INTRAMUSCULAR | Status: DC | PRN
  Administered 2012-07-09 (×4): 25 ug via INTRAVENOUS

## 2012-07-09 MED ORDER — DIPHENHYDRAMINE HCL 50 MG/ML IJ SOLN
INTRAMUSCULAR | Status: AC
  Filled 2012-07-09: qty 1

## 2012-07-09 MED ORDER — GLYCOPYRROLATE 0.2 MG/ML IJ SOLN
INTRAMUSCULAR | Status: AC
  Filled 2012-07-09: qty 1

## 2012-07-09 MED ORDER — BUTAMBEN-TETRACAINE-BENZOCAINE 2-2-14 % EX AERO
INHALATION_SPRAY | CUTANEOUS | Status: DC | PRN
  Administered 2012-07-09: 2 via TOPICAL

## 2012-07-09 MED ORDER — MIDAZOLAM HCL 10 MG/2ML IJ SOLN
INTRAMUSCULAR | Status: AC
  Filled 2012-07-09: qty 4

## 2012-07-09 NOTE — Op Note (Addendum)
[facility] [address] [place], [address]   ENDOSCOPY PROCEDURE REPORT  PATIENT: [name], [name]  MR#: [id number] BIRTHDATE: 28-Mar-1961 , 51  yrs. old GENDER: Male ENDOSCOPIST: Louis Meckel, MD REFERRED BY: PROCEDURE DATE:  07/09/2012 PROCEDURE:  EGD w/ biopsy ASA CLASS:     Class II INDICATIONS:  Occult blood positive. MEDICATIONS: These medications were titrated to patient response per physician's verbal order, Versed 8 mg IV, Fentanyl 100 mcg IV, Benadryl 50 mg IV, and Robinul 0.2 mg IV TOPICAL ANESTHETIC: Cetacaine Spray  DESCRIPTION OF PROCEDURE: After the risks benefits and alternatives of the procedure were thoroughly explained, informed consent was obtained.  The Pentax Gastroscope [id number] endoscope was introduced through the mouth and advanced to the third portion of the duodenum. Without limitations.  The instrument was slowly withdrawn as the mucosa was fully examined.      In the gastric antrum there are multiple areas of deep erythema.  No fresh or old blood was seen.  Biopsies were taken.   In the gastric antrum there are multiple areas of deep erythema.  No fresh or old blood was seen.  Biopsies were taken.   The remainder of the upper endoscopy exam was otherwise normal.   Photodocumentation is unavailable.   Photodocumentation is unavailable.  Retroflexed views revealed no abnormalities.     The scope was then withdrawn from the patient and the procedure completed.  COMPLICATIONS: There were no complications. ENDOSCOPIC IMPRESSION: Nonerosive gastritis  RECOMMENDATIONS: Continue PPI therapy Followup Hemoccults in one week; if positive will need colonoscopy REPEAT EXAM: eSigned:  Louis Meckel, MD 07/09/2012 8:45 AM Revised: 07/09/2012 8:45 AM  CC:

## 2012-07-09 NOTE — H&P (View-Only) (Signed)
Reviewed and agree with management. Robert D. Kaplan, M.D., FACG  

## 2012-07-09 NOTE — Interval H&P Note (Signed)
History and Physical Interval Note:  07/09/2012 8:06 AM  [name]  has presented today for surgery, with the diagnosis of GI bleed [578.9]  The various methods of treatment have been discussed with the patient and family. After consideration of risks, benefits and other options for treatment, the patient has consented to  Procedure(s): ESOPHAGOGASTRODUODENOSCOPY (EGD) (N/A) as a surgical intervention .  The patient's history has been reviewed, patient examined, no change in status, stable for surgery.  I have reviewed the patient's chart and labs.  Questions were answered to the patient's satisfaction.     The recent H&P (dated *07/07/12**) was reviewed, the patient was examined and there is no change in the patients condition since that H&P was completed.   [name]  07/09/2012, 8:06 AM   [name]

## 2012-07-10 ENCOUNTER — Encounter (HOSPITAL_COMMUNITY): Payer: Self-pay | Admitting: Gastroenterology

## 2012-07-21 ENCOUNTER — Telehealth: Payer: Self-pay | Admitting: Gastroenterology

## 2012-07-21 NOTE — Telephone Encounter (Signed)
Pt upset because he is still having rectal bleeding. Asked pt if he had completed the stool cards following the EGD. Per EGD note if bleeding continued he would need a colon. Pt states he just had a colon in August. Pt scheduled to see Mike Gip PA Wednesday at 10am. Pt aware of appt date and time.

## 2012-07-23 ENCOUNTER — Ambulatory Visit: Payer: BC Managed Care – PPO | Admitting: Physician Assistant

## 2012-08-05 ENCOUNTER — Emergency Department (HOSPITAL_COMMUNITY): Payer: BC Managed Care – PPO

## 2012-08-05 ENCOUNTER — Encounter (HOSPITAL_COMMUNITY): Payer: Self-pay | Admitting: Emergency Medicine

## 2012-08-05 ENCOUNTER — Emergency Department (HOSPITAL_COMMUNITY)
Admission: EM | Admit: 2012-08-05 | Discharge: 2012-08-05 | Disposition: A | Discharge status: Home / Self Care | Payer: BC Managed Care – PPO | Attending: Emergency Medicine | Admitting: Emergency Medicine

## 2012-08-05 DIAGNOSIS — R5383 Other fatigue: Secondary | ICD-10-CM | POA: Insufficient documentation

## 2012-08-05 DIAGNOSIS — Z8719 Personal history of other diseases of the digestive system: Secondary | ICD-10-CM | POA: Insufficient documentation

## 2012-08-05 DIAGNOSIS — Z862 Personal history of diseases of the blood and blood-forming organs and certain disorders involving the immune mechanism: Secondary | ICD-10-CM | POA: Insufficient documentation

## 2012-08-05 DIAGNOSIS — Z8639 Personal history of other endocrine, nutritional and metabolic disease: Secondary | ICD-10-CM | POA: Insufficient documentation

## 2012-08-05 DIAGNOSIS — R0602 Shortness of breath: Secondary | ICD-10-CM | POA: Insufficient documentation

## 2012-08-05 DIAGNOSIS — R1084 Generalized abdominal pain: Secondary | ICD-10-CM | POA: Insufficient documentation

## 2012-08-05 DIAGNOSIS — K625 Hemorrhage of anus and rectum: Secondary | ICD-10-CM | POA: Insufficient documentation

## 2012-08-05 DIAGNOSIS — Z87891 Personal history of nicotine dependence: Secondary | ICD-10-CM | POA: Insufficient documentation

## 2012-08-05 DIAGNOSIS — Z87442 Personal history of urinary calculi: Secondary | ICD-10-CM | POA: Insufficient documentation

## 2012-08-05 DIAGNOSIS — Z79899 Other long term (current) drug therapy: Secondary | ICD-10-CM | POA: Insufficient documentation

## 2012-08-05 DIAGNOSIS — R61 Generalized hyperhidrosis: Secondary | ICD-10-CM | POA: Insufficient documentation

## 2012-08-05 DIAGNOSIS — Z9889 Other specified postprocedural states: Secondary | ICD-10-CM | POA: Insufficient documentation

## 2012-08-05 DIAGNOSIS — M129 Arthropathy, unspecified: Secondary | ICD-10-CM | POA: Insufficient documentation

## 2012-08-05 DIAGNOSIS — R110 Nausea: Secondary | ICD-10-CM | POA: Insufficient documentation

## 2012-08-05 DIAGNOSIS — K921 Melena: Secondary | ICD-10-CM | POA: Insufficient documentation

## 2012-08-05 DIAGNOSIS — K219 Gastro-esophageal reflux disease without esophagitis: Secondary | ICD-10-CM | POA: Insufficient documentation

## 2012-08-05 DIAGNOSIS — R5381 Other malaise: Secondary | ICD-10-CM | POA: Insufficient documentation

## 2012-08-05 DIAGNOSIS — I10 Essential (primary) hypertension: Secondary | ICD-10-CM | POA: Insufficient documentation

## 2012-08-05 LAB — CBC WITH DIFFERENTIAL/PLATELET
Basophils Relative: 0 % (ref 0–1)
Eosinophils Absolute: 0 10*3/uL (ref 0.0–0.7)
Eosinophils Relative: 0 % (ref 0–5)
HCT: 41.5 % (ref 39.0–52.0)
Hemoglobin: 14.3 g/dL (ref 13.0–17.0)
Lymphs Abs: 2.1 10*3/uL (ref 0.7–4.0)
MCH: 31.4 pg (ref 26.0–34.0)
MCHC: 34.5 g/dL (ref 30.0–36.0)
MCV: 91.2 fL (ref 78.0–100.0)
Monocytes Absolute: 0.7 10*3/uL (ref 0.1–1.0)
Monocytes Relative: 8 % (ref 3–12)
Neutrophils Relative %: 70 % (ref 43–77)
RBC: 4.55 MIL/uL (ref 4.22–5.81)

## 2012-08-05 LAB — COMPREHENSIVE METABOLIC PANEL
ALT: 52 U/L (ref 0–53)
Albumin: 4.6 g/dL (ref 3.5–5.2)
Alkaline Phosphatase: 39 U/L (ref 39–117)
BUN: 14 mg/dL (ref 6–23)
Calcium: 9.3 mg/dL (ref 8.4–10.5)
Creatinine, Ser: 0.99 mg/dL (ref 0.50–1.35)
GFR calc Af Amer: >90 mL/min (ref >90)
Glucose, Bld: 118 mg/dL — ABNORMAL HIGH (ref 70–99)
Potassium: 3.6 mEq/L (ref 3.5–5.1)
Total Protein: 7.9 g/dL (ref 6.0–8.3)

## 2012-08-05 LAB — URINALYSIS, ROUTINE W REFLEX MICROSCOPIC
Bilirubin Urine: NEGATIVE
Hgb urine dipstick: NEGATIVE
Leukocytes, UA: NEGATIVE
Nitrite: NEGATIVE
Specific Gravity, Urine: 1.023 (ref 1.005–1.030)
Urobilinogen, UA: 0.2 mg/dL (ref 0.0–1.0)
pH: 5 (ref 5.0–8.0)

## 2012-08-05 LAB — LIPASE, BLOOD: Lipase: 37 U/L (ref 11–59)

## 2012-08-05 MED ORDER — IOHEXOL 300 MG/ML  SOLN
100.0000 mL | Freq: Once | INTRAMUSCULAR | Status: AC | PRN
  Administered 2012-08-05: 100 mL via INTRAVENOUS

## 2012-08-05 MED ORDER — ONDANSETRON HCL 4 MG/2ML IJ SOLN
4.0000 mg | Freq: Once | INTRAMUSCULAR | Status: AC
  Administered 2012-08-05: 4 mg via INTRAVENOUS
  Filled 2012-08-05: qty 2

## 2012-08-05 MED ORDER — IOHEXOL 300 MG/ML  SOLN
50.0000 mL | Freq: Once | INTRAMUSCULAR | Status: AC | PRN
  Administered 2012-08-05: 50 mL via ORAL

## 2012-08-05 MED ORDER — HYDROMORPHONE HCL PF 1 MG/ML IJ SOLN
1.0000 mg | Freq: Once | INTRAMUSCULAR | Status: AC
  Administered 2012-08-05: 1 mg via INTRAVENOUS
  Filled 2012-08-05: qty 1

## 2012-08-05 MED ORDER — SODIUM CHLORIDE 0.9 % IV BOLUS (SEPSIS)
1000.0000 mL | Freq: Once | INTRAVENOUS | Status: AC
  Administered 2012-08-05: 1000 mL via INTRAVENOUS

## 2012-08-05 NOTE — ED Notes (Signed)
Per EMS: Pt c/o LUQ abdominal pain. Pt has a history of pancreatitis; patient feels it may be related to pancreatitis or heartburn. Pt also has chronic rectal bleeding that has been evaluated by endoscopy and colonoscopy. Pt reports vomiting once and also one episode of bloody stool this morning.

## 2012-08-05 NOTE — ED Notes (Signed)
YNW:[id number]<[id number]> Expected date:<BR> Expected time:<BR> Means of arrival:<BR> Comments:<BR> 52 y/o M abd pain

## 2012-08-05 NOTE — ED Provider Notes (Signed)
Medical screening examination/treatment/procedure(s) were performed by non-physician practitioner and as supervising physician I was immediately available for consultation/collaboration.   Loren Racer, MD 08/05/12 [phone]

## 2012-08-05 NOTE — ED Provider Notes (Signed)
History     CSN: [id number]  Arrival date & time 08/05/12  1602   First MD Initiated Contact with Patient 08/05/12 1606      Chief Complaint  Patient presents with  . Abdominal Pain  . Rectal Bleeding    Chronic  . Shortness of Breath    (Consider location/radiation/quality/duration/timing/severity/associated sxs/prior treatment) HPI Comments: Patient with history of alcohol abuse, rectal bleeding presents with continued dark red blood being noted in stools. This has been associated with cramping generalized abdominal pain over the past week which is worse this morning. Patient states that he attempted to drive to the emergency department but became diaphoretic and called EMS. Patient has had a workup for this for the past year including a colonoscopy and upper endoscopy. Patient states that he has been under a lot of stress and fatigue. He states that he becomes very short of breath after walking up stairs. Patient admits to 3-4 beers and 3-4 drinks 2-3 times a week. No heavy NSAID use. He has had nausea but no persistent vomiting. No diarrhea. No fever or chest pain. No treatments prior to arrival. Patient is on daily omeprazole. Onset of symptoms insidious. Course is constant. Nothing makes symptoms better or worse.  The history is provided by the patient.    Past Medical History  Diagnosis Date  . Ureteral stent displacement 1990  . Nephrolithiasis   . Hypertension   . Pancreatitis     Hx of   . Kidney stones   . Hyperlipemia   . GERD (gastroesophageal reflux disease)   . Alcohol abuse   . Arthritis   . Diverticulosis of colon (without mention of hemorrhage)     Past Surgical History  Procedure Laterality Date  . Ureteroscopy      and stone retreval  . A2 pulley flexor sheath cyst 4th finger excisional biopsy      '03  . Anterior cruciate ligament repair      R knee  . Knee surgery  2008  . Esophagogastroduodenoscopy N/A 07/09/2012    Procedure:  ESOPHAGOGASTRODUODENOSCOPY (EGD);  Surgeon: Louis Meckel, MD;  Location: [facility];  Service: Endoscopy;  Laterality: N/A;    Family History  Problem Relation Age of Onset  . Hypertension Father     X4 siblings  . Diabetes Mother     x2 brothers  . Heart disease Father     History  Substance Use Topics  . Smoking status: Former Smoker    Types: Cigarettes    Quit date: 07/10/1998  . Smokeless tobacco: Never Used  . Alcohol Use: 18.0 oz/week    15 Glasses of wine, 15 Cans of beer per week      Review of Systems  Constitutional: Positive for fatigue. Negative for fever and unexpected weight change.  HENT: Negative for sore throat and rhinorrhea.   Eyes: Negative for redness.  Respiratory: Negative for cough and shortness of breath.   Cardiovascular: Negative for chest pain.  Gastrointestinal: Positive for nausea, abdominal pain and blood in stool. Negative for vomiting and diarrhea.  Genitourinary: Negative for dysuria.  Musculoskeletal: Negative for myalgias.  Skin: Negative for rash.  Neurological: Negative for headaches.    Allergies  Vicodin  Home Medications   Current Outpatient Rx  Name  Route  Sig  Dispense  Refill  . amLODipine-benazepril (LOTREL) 5-20 MG per capsule   Oral   Take 1 capsule by mouth every morning.         [name] aspirin-acetaminophen-caffeine (  EXCEDRIN MIGRAINE) 250-250-65 MG per tablet   Oral   Take 1 tablet by mouth as needed for pain.         . cyclobenzaprine (FLEXERIL) 5 MG tablet      5 mg 2 (two) times daily as needed. Take 1/2 tablet every 6 hours as needed for muscle spasm.         . diclofenac sodium (VOLTAREN) 1 % GEL   Topical   Apply 1 g topically 2 (two) times daily as needed. To affected area 4 x daily for pain         . furosemide (LASIX) 20 MG tablet   Oral   Take 20 mg by mouth daily.         [name] ibuprofen (ADVIL,MOTRIN) 800 MG tablet   Oral   Take 800 mg by mouth every 8 (eight) hours as needed for  pain.         . Multiple Vitamins-Minerals (MULTIVITAMIN WITH MINERALS) tablet   Oral   Take 1 tablet by mouth daily.         [name] omeprazole (PRILOSEC) 20 MG capsule   Oral   Take 20 mg by mouth daily.           BP 142/102  Pulse 107  Temp(Src) 99 F (37.2 C) (Oral)  Resp 20  SpO2 95%  Physical Exam  Nursing note and vitals reviewed. Constitutional: He appears well-developed and well-nourished.  HENT:  Head: Normocephalic and atraumatic.  Eyes: Conjunctivae are normal. Right eye exhibits no discharge. Left eye exhibits no discharge.  Neck: Normal range of motion. Neck supple.  Cardiovascular: Normal rate, regular rhythm and normal heart sounds.   Pulmonary/Chest: Effort normal and breath sounds normal.  Abdominal: Soft. Bowel sounds are normal. He exhibits no distension. There is tenderness (Generalized). There is no rebound and no guarding.  Neurological: He is alert.  Skin: Skin is warm and dry.  Psychiatric: He has a normal mood and affect.    ED Course  Procedures (including critical care time)  Labs Reviewed  COMPREHENSIVE METABOLIC PANEL - Abnormal; Notable for the following:    Glucose, Bld 118 (*)    AST 39 (*)    All other components within normal limits  CBC WITH DIFFERENTIAL  LIPASE, BLOOD  URINALYSIS, ROUTINE W REFLEX MICROSCOPIC   Ct Abdomen Pelvis W Contrast  08/05/2012  *RADIOLOGY REPORT*  Clinical Data: Abdominal pain.  Rectal bleeding.  CT ABDOMEN AND PELVIS WITH CONTRAST  Technique:  Multidetector CT imaging of the abdomen and pelvis was performed following the standard protocol during bolus administration of intravenous contrast.  Contrast: OMNIPAQUE IOHEXOL 300 MG/ML  SOLN, 50mL OMNIPAQUE IOHEXOL 300 MG/ML  SOLN  Comparison: 11/10/2010  Findings: Diffuse hepatic steatosis.  Gallbladder, spleen, pancreas, adrenal glands are within normal limits.  Stable appearance of the kidneys.  No hydronephrosis or mass.  No urinary calculus.  Normal  appendix.  No disproportionate dilatation of bowel.  No free fluid.  No obvious abnormal adenopathy.  Atherosclerotic changes of the infrarenal aorta are noted.  Diffuse bladder wall thickening with decompression is noted.  No destructive bone lesion.  Broad-based central disc protrusion at L3-4 and L4-5 effaces the anterior thecal sac.  IMPRESSION: There is diffuse bladder wall thickening.  An inflammatory process is not excluded.  Correlation with urinalysis is recommended. Otherwise, no evidence of acute intra-abdominal pathology.   Original Report Authenticated By: Jolaine Click, M.D.      1. Rectal bleeding  4:21 PM Patient seen and examined. Work-up initiated. Endoscopy and colonoscopy reports reviewed. Colonoscopy performed 12/2011 showed extensive diverticulosis. Upper endoscopy performed 07/2012 shows multiple areas of erythema consistent with gastritis without active bleeding.  Vital signs reviewed and are as follows: Filed Vitals:   08/05/12 1607  BP: 142/102  Pulse: 107  Temp: 99 F (37.2 C)  Resp: 20   Patient d/w Dr. Ranae Palms. CT ordered to r/o diverticulitis given left-sided pain and h/o diverticula.   8:06 PM Results reviewed with patient. Discussed that he will need to followup with primary care physician and gastroenterologist for further evaluation.  The patient was urged to return to the Emergency Department immediately with worsening of current symptoms, worsening abdominal pain, persistent vomiting, blood noted in stools, fever, or any other concerns. The patient verbalized understanding.        MDM  Rectal bleeding, chronic. Patient is not anemic. He has abdominal pain but no infection on CT scan. Labs are reassuring. Patient has PCP and gastroenterology followup. He will need continued GI followup to identify source of bleeding. Question bleeding diverticulum.    Renne Crigler, PA-C 08/05/12 2009

## 2012-08-16 ENCOUNTER — Emergency Department (HOSPITAL_COMMUNITY)
Admission: EM | Admit: 2012-08-16 | Discharge: 2012-08-16 | Disposition: A | Discharge status: Home / Self Care | Payer: BC Managed Care – PPO | Attending: Emergency Medicine | Admitting: Emergency Medicine

## 2012-08-16 ENCOUNTER — Encounter (HOSPITAL_COMMUNITY): Payer: Self-pay | Admitting: *Deleted

## 2012-08-16 ENCOUNTER — Emergency Department (HOSPITAL_COMMUNITY): Payer: BC Managed Care – PPO

## 2012-08-16 ENCOUNTER — Telehealth (HOSPITAL_COMMUNITY): Payer: Self-pay | Admitting: *Deleted

## 2012-08-16 DIAGNOSIS — R071 Chest pain on breathing: Secondary | ICD-10-CM | POA: Insufficient documentation

## 2012-08-16 DIAGNOSIS — Z79899 Other long term (current) drug therapy: Secondary | ICD-10-CM | POA: Insufficient documentation

## 2012-08-16 DIAGNOSIS — K219 Gastro-esophageal reflux disease without esophagitis: Secondary | ICD-10-CM | POA: Insufficient documentation

## 2012-08-16 DIAGNOSIS — Z87442 Personal history of urinary calculi: Secondary | ICD-10-CM | POA: Insufficient documentation

## 2012-08-16 DIAGNOSIS — Z8739 Personal history of other diseases of the musculoskeletal system and connective tissue: Secondary | ICD-10-CM | POA: Insufficient documentation

## 2012-08-16 DIAGNOSIS — R0789 Other chest pain: Secondary | ICD-10-CM

## 2012-08-16 DIAGNOSIS — Z87891 Personal history of nicotine dependence: Secondary | ICD-10-CM | POA: Insufficient documentation

## 2012-08-16 DIAGNOSIS — E785 Hyperlipidemia, unspecified: Secondary | ICD-10-CM | POA: Insufficient documentation

## 2012-08-16 DIAGNOSIS — I10 Essential (primary) hypertension: Secondary | ICD-10-CM | POA: Insufficient documentation

## 2012-08-16 DIAGNOSIS — Z8719 Personal history of other diseases of the digestive system: Secondary | ICD-10-CM | POA: Insufficient documentation

## 2012-08-16 LAB — COMPREHENSIVE METABOLIC PANEL
ALT: 50 U/L (ref 0–53)
AST: 67 U/L — ABNORMAL HIGH (ref 0–37)
Albumin: 4.6 g/dL (ref 3.5–5.2)
Alkaline Phosphatase: 37 U/L — ABNORMAL LOW (ref 39–117)
BUN: 11 mg/dL (ref 6–23)
Calcium: 9.3 mg/dL (ref 8.4–10.5)
Chloride: 99 mEq/L (ref 96–112)
Potassium: 4.2 mEq/L (ref 3.5–5.1)
Sodium: 138 mEq/L (ref 135–145)
Total Protein: 8.3 g/dL (ref 6.0–8.3)

## 2012-08-16 LAB — CBC WITH DIFFERENTIAL/PLATELET
Basophils Relative: 0 % (ref 0–1)
Eosinophils Absolute: 0.1 10*3/uL (ref 0.0–0.7)
HCT: 41.8 % (ref 39.0–52.0)
MCH: 31.5 pg (ref 26.0–34.0)
MCHC: 34.9 g/dL (ref 30.0–36.0)
Neutro Abs: 5.5 10*3/uL (ref 1.7–7.7)
Neutrophils Relative %: 57 % (ref 43–77)
Platelets: 298 10*3/uL (ref 150–400)
RBC: 4.64 MIL/uL (ref 4.22–5.81)
WBC: 9.7 10*3/uL (ref 4.0–10.5)

## 2012-08-16 LAB — D-DIMER, QUANTITATIVE (NOT AT ARMC): D-Dimer, Quant: 0.31 ug/mL-FEU (ref 0.00–0.48)

## 2012-08-16 LAB — POCT I-STAT TROPONIN I

## 2012-08-16 MED ORDER — ONDANSETRON HCL 4 MG/2ML IJ SOLN
4.0000 mg | Freq: Once | INTRAMUSCULAR | Status: AC
  Administered 2012-08-16: 4 mg via INTRAVENOUS
  Filled 2012-08-16: qty 2

## 2012-08-16 MED ORDER — SODIUM CHLORIDE 0.9 % IV SOLN
INTRAVENOUS | Status: DC
  Administered 2012-08-16: 12:00:00 via INTRAVENOUS

## 2012-08-16 MED ORDER — METHOCARBAMOL 750 MG PO TABS: 750.0000 mg | ORAL_TABLET | Freq: Four times a day (QID) | ORAL | Status: DC

## 2012-08-16 NOTE — Discharge Instructions (Signed)
Chest Wall Pain Chest wall pain is pain in or around the bones and muscles of your chest. It may take up to 6 weeks to get better. It may take longer if you must stay physically active in your work and activities.  CAUSES  Chest wall pain may happen on its own. However, it may be caused by:  A viral illness like the flu.  Injury.  Coughing.  Exercise.  Arthritis.  Fibromyalgia.  Shingles. HOME CARE INSTRUCTIONS   Avoid overtiring physical activity. Try not to strain or perform activities that cause pain. This includes any activities using your chest or your abdominal and side muscles, especially if heavy weights are used.  Put ice on the sore area.  Put ice in a plastic bag.  Place a towel between your skin and the bag.  Leave the ice on for 15 to 20 minutes per hour while awake for the first 2 days.  Only take over-the-counter or prescription medicines for pain, discomfort, or fever as directed by your caregiver. SEEK IMMEDIATE MEDICAL CARE IF:   Your pain increases, or you are very uncomfortable.  You have a fever.  Your chest pain becomes worse.  You have new, unexplained symptoms.  You have nausea or vomiting.  You feel sweaty or lightheaded.  You have a cough with phlegm (sputum), or you cough up blood. MAKE SURE YOU:   Understand these instructions.  Will watch your condition.  Will get help right away if you are not doing well or get worse. Document Released: 04/23/2005 Document Revised: 07/16/2011 Document Reviewed: 12/18/2010 [employer] Patient Information 2013 [employer].  

## 2012-08-16 NOTE — ED Notes (Signed)
Pt ambulated to to radiology with tech. [name], MD remains at bedside talking with companion.

## 2012-08-16 NOTE — ED Notes (Signed)
Patient is resting comfortably. Companion at bedside.

## 2012-08-16 NOTE — ED Notes (Signed)
Pt presents to ed with c/o left sided chest pain that started this morning. Pt also reports dizziness and nausea. Sts pain is 10/10 and is burning and squeezing in nature.

## 2012-08-16 NOTE — ED Notes (Signed)
[name], MD remains at bedside.

## 2012-08-16 NOTE — ED Notes (Signed)
MD at bedside. 

## 2012-08-16 NOTE — ED Notes (Signed)
Allen, MD at bedside

## 2012-08-16 NOTE — ED Provider Notes (Signed)
History     CSN: [id number]  Arrival date & time 08/16/12  1144   First MD Initiated Contact with Patient 08/16/12 1200      Chief Complaint  Patient presents with  . Chest Pain    (Consider location/radiation/quality/duration/timing/severity/associated sxs/prior treatment) Patient is a 52 y.o. male presenting with chest pain. The history is provided by the patient and the spouse.  Chest Pain  patient here complaining of sharp left upper chest pain lasting for seconds x1 day. Some nausea but no associated diaphoresis or dyspnea. No fever or chills or cough. No rashes to the area. Symptoms have been nonexertional. No prior history of same and nothing makes them better or worse. Denies any syncope or near-syncope. No treatment used prior to arrival  Past Medical History  Diagnosis Date  . Ureteral stent displacement 1990  . Hypertension   . Pancreatitis     Hx of   . Hyperlipemia   . GERD (gastroesophageal reflux disease)   . Alcohol abuse   . Arthritis   . Diverticulosis of colon (without mention of hemorrhage)   . Nephrolithiasis   . Kidney stones     Past Surgical History  Procedure Laterality Date  . Ureteroscopy      and stone retreval  . A2 pulley flexor sheath cyst 4th finger excisional biopsy      '03  . Anterior cruciate ligament repair      R knee  . Knee surgery  2008  . Esophagogastroduodenoscopy N/A 07/09/2012    Procedure: ESOPHAGOGASTRODUODENOSCOPY (EGD);  Surgeon: Louis Meckel, MD;  Location: [facility];  Service: Endoscopy;  Laterality: N/A;    Family History  Problem Relation Age of Onset  . Hypertension Father     X4 siblings  . Diabetes Mother     x2 brothers  . Heart disease Father     History  Substance Use Topics  . Smoking status: Former Smoker    Types: Cigarettes    Quit date: 07/10/1998  . Smokeless tobacco: Never Used  . Alcohol Use: 18.0 oz/week    15 Glasses of wine, 15 Cans of beer per week      Review of Systems    Cardiovascular: Positive for chest pain.  All other systems reviewed and are negative.    Allergies  Shrimp and Vicodin  Home Medications   Current Outpatient Rx  Name  Route  Sig  Dispense  Refill  . acetaminophen (TYLENOL) 500 MG tablet   Oral   Take 1,000 mg by mouth every 6 (six) hours as needed for pain.         [name] amLODipine-benazepril (LOTREL) 5-20 MG per capsule   Oral   Take 1 capsule by mouth every morning.         . Chlorpheniramine-DM (CORICIDIN HBP COUGH/COLD PO)   Oral   Take 1 tablet by mouth.         . cyclobenzaprine (FLEXERIL) 5 MG tablet   Oral   Take 5 mg by mouth 2 (two) times daily as needed for muscle spasms.          . furosemide (LASIX) 20 MG tablet   Oral   Take 20 mg by mouth daily.         . Multiple Vitamins-Minerals (MULTIVITAMIN WITH MINERALS) tablet   Oral   Take 1 tablet by mouth daily.         [name] omeprazole (PRILOSEC) 20 MG capsule   Oral   Take 20  mg by mouth 2 (two) times daily.            BP 159/104  Pulse 83  Temp(Src) 98.8 F (37.1 C)  Resp 18  SpO2 98%  Physical Exam  Nursing note and vitals reviewed. Constitutional: He is oriented to person, place, and time. He appears well-developed and well-nourished.  Non-toxic appearance. No distress.  HENT:  Head: Normocephalic and atraumatic.  Eyes: Conjunctivae, EOM and lids are normal. Pupils are equal, round, and reactive to light.  Neck: Normal range of motion. Neck supple. No tracheal deviation present. No mass present.  Cardiovascular: Normal rate, regular rhythm and normal heart sounds.  Exam reveals no gallop.   No murmur heard. Pulmonary/Chest: Effort normal and breath sounds normal. No stridor. No respiratory distress. He has no decreased breath sounds. He has no wheezes. He has no rhonchi. He has no rales. He exhibits bony tenderness.    Abdominal: Soft. Normal appearance and bowel sounds are normal. He exhibits no distension. There is no tenderness.  There is no rebound and no CVA tenderness.  Musculoskeletal: Normal range of motion. He exhibits no edema and no tenderness.  Neurological: He is alert and oriented to person, place, and time. He has normal strength. No cranial nerve deficit or sensory deficit. GCS eye subscore is 4. GCS verbal subscore is 5. GCS motor subscore is 6.  Skin: Skin is warm and dry. No abrasion and no rash noted.  Psychiatric: He has a normal mood and affect. His speech is normal and behavior is normal.    ED Course  Procedures (including critical care time)  Labs Reviewed  CBC WITH DIFFERENTIAL  COMPREHENSIVE METABOLIC PANEL  D-DIMER, QUANTITATIVE   No results found.   No diagnosis found.    MDM   Date: 08/16/2012  Rate: **89*  Rhythm: normal sinus rhythm  QRS Axis: normal  Intervals: normal  ST/T Wave abnormalities: nonspecific ST changes  Conduction Disutrbances:none  Narrative Interpretation:   Old EKG Reviewed: unchanged from 11/21/10  1:32 PM  Pt states that he pulled a heavy object yesterday--his chest pain is point tender, doubt that this is acs or dissection, suspect musculoskleteal pain--stable for d/c       Toy Baker, MD 08/16/12 1333

## 2012-09-04 ENCOUNTER — Other Ambulatory Visit: Payer: Self-pay | Admitting: Internal Medicine

## 2012-09-21 ENCOUNTER — Other Ambulatory Visit: Payer: Self-pay | Admitting: Internal Medicine

## 2012-10-30 ENCOUNTER — Encounter (HOSPITAL_COMMUNITY): Payer: Self-pay | Admitting: Emergency Medicine

## 2012-10-30 ENCOUNTER — Emergency Department (HOSPITAL_COMMUNITY)
Admission: EM | Admit: 2012-10-30 | Discharge: 2012-10-31 | Disposition: A | Discharge status: Home / Self Care | Payer: BC Managed Care – PPO | Attending: Emergency Medicine | Admitting: Emergency Medicine

## 2012-10-30 DIAGNOSIS — K219 Gastro-esophageal reflux disease without esophagitis: Secondary | ICD-10-CM | POA: Insufficient documentation

## 2012-10-30 DIAGNOSIS — I10 Essential (primary) hypertension: Secondary | ICD-10-CM | POA: Insufficient documentation

## 2012-10-30 DIAGNOSIS — Z862 Personal history of diseases of the blood and blood-forming organs and certain disorders involving the immune mechanism: Secondary | ICD-10-CM | POA: Insufficient documentation

## 2012-10-30 DIAGNOSIS — Z8719 Personal history of other diseases of the digestive system: Secondary | ICD-10-CM | POA: Insufficient documentation

## 2012-10-30 DIAGNOSIS — Z87891 Personal history of nicotine dependence: Secondary | ICD-10-CM | POA: Insufficient documentation

## 2012-10-30 DIAGNOSIS — R109 Unspecified abdominal pain: Secondary | ICD-10-CM

## 2012-10-30 DIAGNOSIS — Z8739 Personal history of other diseases of the musculoskeletal system and connective tissue: Secondary | ICD-10-CM | POA: Insufficient documentation

## 2012-10-30 DIAGNOSIS — Z79899 Other long term (current) drug therapy: Secondary | ICD-10-CM | POA: Insufficient documentation

## 2012-10-30 DIAGNOSIS — Z87442 Personal history of urinary calculi: Secondary | ICD-10-CM | POA: Insufficient documentation

## 2012-10-30 DIAGNOSIS — Z8639 Personal history of other endocrine, nutritional and metabolic disease: Secondary | ICD-10-CM | POA: Insufficient documentation

## 2012-10-30 DIAGNOSIS — E876 Hypokalemia: Secondary | ICD-10-CM | POA: Insufficient documentation

## 2012-10-30 DIAGNOSIS — R110 Nausea: Secondary | ICD-10-CM | POA: Insufficient documentation

## 2012-10-30 DIAGNOSIS — M549 Dorsalgia, unspecified: Secondary | ICD-10-CM | POA: Insufficient documentation

## 2012-10-30 MED ORDER — ONDANSETRON HCL 4 MG/2ML IJ SOLN
4.0000 mg | INTRAMUSCULAR | Status: AC
  Administered 2012-10-30: 4 mg via INTRAVENOUS
  Filled 2012-10-30: qty 2

## 2012-10-30 NOTE — ED Notes (Signed)
Pt states he started having abd pain last night but he took some pain medication but the pain has progressively gotten worse since then  Pt states he has abd pain mainly on the right side and back pain in his lower back on both sides  Pt states he has hx of kidney stones and this feels similar  Pt states he has nausea but has not been able to vomit

## 2012-10-31 ENCOUNTER — Emergency Department (HOSPITAL_COMMUNITY): Payer: BC Managed Care – PPO

## 2012-10-31 LAB — CBC WITH DIFFERENTIAL/PLATELET
Basophils Absolute: 0 10*3/uL (ref 0.0–0.1)
Basophils Relative: 0 % (ref 0–1)
Eosinophils Absolute: 0.1 10*3/uL (ref 0.0–0.7)
Hemoglobin: 14.6 g/dL (ref 13.0–17.0)
Lymphocytes Relative: 25 % (ref 12–46)
MCHC: 34.2 g/dL (ref 30.0–36.0)
Monocytes Relative: 8 % (ref 3–12)
Neutro Abs: 7.2 10*3/uL (ref 1.7–7.7)
Neutrophils Relative %: 66 % (ref 43–77)
Platelets: 257 10*3/uL (ref 150–400)
RDW: 12.5 % (ref 11.5–15.5)

## 2012-10-31 LAB — URINALYSIS, ROUTINE W REFLEX MICROSCOPIC
Glucose, UA: NEGATIVE mg/dL
Ketones, ur: NEGATIVE mg/dL
Leukocytes, UA: NEGATIVE
Nitrite: NEGATIVE
Protein, ur: 30 mg/dL — AB
Urobilinogen, UA: 0.2 mg/dL (ref 0.0–1.0)
pH: 5 (ref 5.0–8.0)

## 2012-10-31 LAB — BASIC METABOLIC PANEL
CO2: 27 mEq/L (ref 19–32)
Chloride: 100 mEq/L (ref 96–112)
GFR calc Af Amer: >90 mL/min (ref >90)
GFR calc non Af Amer: 78 mL/min — ABNORMAL LOW (ref >90)
Potassium: 2.9 mEq/L — ABNORMAL LOW (ref 3.5–5.1)

## 2012-10-31 LAB — HEPATIC FUNCTION PANEL
Albumin: 4.6 g/dL (ref 3.5–5.2)
Indirect Bilirubin: 0.6 mg/dL (ref 0.3–0.9)
Total Bilirubin: 0.7 mg/dL (ref 0.3–1.2)
Total Protein: 8.3 g/dL (ref 6.0–8.3)

## 2012-10-31 LAB — URINE MICROSCOPIC-ADD ON

## 2012-10-31 MED ORDER — OXYCODONE-ACETAMINOPHEN 5-325 MG PO TABS: 1.0000 | ORAL_TABLET | Freq: Four times a day (QID) | ORAL | Status: DC | PRN

## 2012-10-31 MED ORDER — MORPHINE SULFATE 4 MG/ML IJ SOLN
4.0000 mg | Freq: Once | INTRAMUSCULAR | Status: AC
  Administered 2012-10-31: 4 mg via INTRAVENOUS
  Filled 2012-10-31: qty 1

## 2012-10-31 MED ORDER — ONDANSETRON HCL 4 MG/2ML IJ SOLN
4.0000 mg | INTRAMUSCULAR | Status: AC
  Administered 2012-10-31: 4 mg via INTRAVENOUS
  Filled 2012-10-31: qty 2

## 2012-10-31 MED ORDER — POTASSIUM CHLORIDE CRYS ER 20 MEQ PO TBCR
40.0000 meq | EXTENDED_RELEASE_TABLET | Freq: Once | ORAL | Status: AC
  Administered 2012-10-31: 40 meq via ORAL
  Filled 2012-10-31: qty 2

## 2012-10-31 MED ORDER — ONDANSETRON HCL 4 MG PO TABS: 4.0000 mg | ORAL_TABLET | Freq: Four times a day (QID) | ORAL | Status: DC

## 2012-10-31 NOTE — ED Provider Notes (Signed)
History    CSN: [id number] Arrival date & time 10/30/12  2249  First MD Initiated Contact with Patient 10/30/12 2334     Chief Complaint  Patient presents with  . Abdominal Pain  . Back Pain   (Consider location/radiation/quality/duration/timing/severity/associated sxs/prior Treatment) HPI Comments: Patient is a 52 year old male with a history of kidney stones, pancreatitis, diverticulosis and reflux who presents for abdominal pain onset 2 days ago. Patient states the pain has been constant and waxing and waning in severity, aggravated with certain positions and when eating and alleviated mildly with heat packs. Patient states he also try to take a laxative for symptoms but this provided him no relief. Patient states the pain is a constant ache with intermittent sharp sensations, intermittently radiating to his R flank. He admits to associated nausea and denies associated fever, chest pain, shortness of breath, vomiting, diarrhea, melena or hematochezia, dysuria or hematuria, and numbness or tingling in his extremities. Last BM this AM which was normal in color and consistency.   Patient is a 52 y.o. male presenting with abdominal pain and back pain. The history is provided by the patient. No language interpreter was used.  Abdominal Pain Associated symptoms include abdominal pain and nausea. Pertinent negatives include no chest pain, fever, numbness, vomiting or weakness.  Back Pain Associated symptoms: abdominal pain   Associated symptoms: no chest pain, no dysuria, no fever, no numbness and no weakness    Past Medical History  Diagnosis Date  . Ureteral stent displacement 1990  . Hypertension   . Pancreatitis     Hx of   . Hyperlipemia   . GERD (gastroesophageal reflux disease)   . Alcohol abuse   . Arthritis   . Diverticulosis of colon (without mention of hemorrhage)   . Nephrolithiasis   . Kidney stones    Past Surgical History  Procedure Laterality Date  . Ureteroscopy       and stone retreval  . A2 pulley flexor sheath cyst 4th finger excisional biopsy      '03  . Anterior cruciate ligament repair      R knee  . Knee surgery  2008  . Esophagogastroduodenoscopy N/A 07/09/2012    Procedure: ESOPHAGOGASTRODUODENOSCOPY (EGD);  Surgeon: Louis Meckel, MD;  Location: [facility];  Service: Endoscopy;  Laterality: N/A;   Family History  Problem Relation Age of Onset  . Hypertension Father     X4 siblings  . Diabetes Mother     x2 brothers  . Heart disease Father    History  Substance Use Topics  . Smoking status: Former Smoker    Types: Cigarettes    Quit date: 07/10/1998  . Smokeless tobacco: Never Used  . Alcohol Use: 0.0 oz/week     Comment: occ    Review of Systems  Constitutional: Negative for fever.  Respiratory: Negative for shortness of breath.   Cardiovascular: Negative for chest pain.  Gastrointestinal: Positive for nausea and abdominal pain. Negative for vomiting, diarrhea and blood in stool.  Genitourinary: Negative for dysuria and hematuria.  Musculoskeletal: Positive for back pain.  Neurological: Negative for weakness and numbness.  All other systems reviewed and are negative.   Allergies  Shrimp and Vicodin  Home Medications   Current Outpatient Rx  Name  Route  Sig  Dispense  Refill  . acetaminophen (TYLENOL) 500 MG tablet   Oral   Take 1,000 mg by mouth every 6 (six) hours as needed for pain.         [name]  amLODipine-benazepril (LOTREL) 5-20 MG per capsule   Oral   Take 1 capsule by mouth every morning.         . furosemide (LASIX) 20 MG tablet   Oral   Take 20 mg by mouth daily.         . metaxalone (SKELAXIN) 800 MG tablet   Oral   Take 800 mg by mouth 3 (three) times daily.         . Multiple Vitamins-Minerals (MULTIVITAMIN WITH MINERALS) tablet   Oral   Take 1 tablet by mouth daily.         [name] omeprazole (PRILOSEC) 20 MG capsule   Oral   Take 20 mg by mouth 2 (two) times daily.          .  ondansetron (ZOFRAN) 4 MG tablet   Oral   Take 1 tablet (4 mg total) by mouth every 6 (six) hours.   12 tablet   0   . oxyCODONE-acetaminophen (PERCOCET/ROXICET) 5-325 MG per tablet   Oral   Take 1 tablet by mouth every 6 (six) hours as needed for pain.   10 tablet   0    BP 162/91  Pulse 68  Temp(Src) 98.2 F (36.8 C) (Oral)  Resp 20  Wt 225 lb (102.059 kg)  BMI 30.51 kg/m2  SpO2 95%  Physical Exam  Nursing note and vitals reviewed. Constitutional: He is oriented to person, place, and time. He appears well-developed and well-nourished. No distress.  HENT:  Head: Normocephalic and atraumatic.  Mouth/Throat: Oropharynx is clear and moist. No oropharyngeal exudate.  Eyes: Conjunctivae and EOM are normal. Pupils are equal, round, and reactive to light. No scleral icterus.  Neck: Normal range of motion. Neck supple.  Cardiovascular: Normal rate, regular rhythm, normal heart sounds and intact distal pulses.   Pulmonary/Chest: Effort normal and breath sounds normal. No respiratory distress. He has no wheezes. He has no rales.  Abdominal: Soft. He exhibits distension (mild). He exhibits no mass. There is tenderness (focal in R mid abdomen). There is no rebound and no guarding.  No peritoneal signs. No Murphy's sign or tenderness at McBurney's point. No palpable abdominal masses.  Musculoskeletal: Normal range of motion. He exhibits no edema.  Lymphadenopathy:    He has no cervical adenopathy.  Neurological: He is alert and oriented to person, place, and time.  Skin: Skin is warm and dry. No rash noted. He is not diaphoretic. No erythema.  Psychiatric: He has a normal mood and affect. His behavior is normal.    ED Course  Procedures (including critical care time) Labs Reviewed  URINALYSIS, ROUTINE W REFLEX MICROSCOPIC - Abnormal; Notable for the following:    Color, Urine AMBER (*)    Specific Gravity, Urine 1.031 (*)    Protein, ur 30 (*)    All other components within  normal limits  CBC WITH DIFFERENTIAL - Abnormal; Notable for the following:    WBC 10.8 (*)    All other components within normal limits  BASIC METABOLIC PANEL - Abnormal; Notable for the following:    Potassium 2.9 (*)    Glucose, Bld 135 (*)    GFR calc non Af Amer 78 (*)    All other components within normal limits  HEPATIC FUNCTION PANEL - Abnormal; Notable for the following:    AST 41 (*)    ALT 59 (*)    All other components within normal limits  URINE MICROSCOPIC-ADD ON  LIPASE, BLOOD   US Abdomen  Complete  10/31/2012   *RADIOLOGY REPORT*  Clinical Data:  Abdominal pain.  COMPLETE ABDOMINAL ULTRASOUND  Comparison:  CT 08/05/2012  Findings:  Gallbladder:  No gallstones, gallbladder wall thickening, or pericholecystic fluid.  Common bile duct:   Within normal limits in caliber.  Liver:  Increased echotexture throughout the liver compatible with fatty infiltration.  Liver appears enlarged, measuring greater than 20 cm craniocaudal length.  No biliary ductal dilatation.  IVC:  Appears normal.  Pancreas:  Not well visualized due to overlying bowel gas.  Spleen:  Within normal limits in size and echotexture.  Right Kidney:   Normal in size and parenchymal echogenicity.  No evidence of mass or hydronephrosis.  Left Kidney:  Normal in size and parenchymal echogenicity.  No evidence of mass or hydronephrosis.  Abdominal aorta:  No aneurysm identified.  IMPRESSION: Hepatomegaly.  Fatty infiltration of the liver.   Original Report Authenticated By: Charlett Nose, M.D.    1. Abdominal pain   2. Hypokalemia     MDM  Patient presents for abdominal pain x 2 days with associated nausea. Physical exam significant for mild focal tenderness in the right mid abdomen as well as the right flank. No peritoneal signs, Murphy's sign, or tenderness at McBurney's point.CBC, BMP, and UA ordered in triage, significant for hypokalemia of 2.9; K-Dur ordered. Extremely mild leukocytosis of 10.8 on CBC without anemia.  Kidney function preserved. Lipase and hepatic function at until last given history of pancreatitis. Patient also admits to frequent alcohol consumption. Ultrasound abdomen ordered for further evaluation of symptoms.  AST and ALT elevated, however, consistent with prior workups. Lipase not elevated to suggest pancreatitis. Ultrasound of the abdomen significant for hepatomegaly with fatty infiltration of the liver. No other abnormalities noted. No TTP appreciated on reexamination of abdomen. Doubt pSBO or SBO given lack of emesis and BM this AM. Also doubt atypical appendicitis given lack of leukocytosis, N/V, and physical exam findings. Do not believe further work up warranted at this time. Patient endorses improvement in symptoms with morphine and Zofran. Patient tolerating fluids by mouth in ED without emesis. Patient hemodynamically stable without fever, tachycardia, tachypnea, dyspnea, or hypoxia. Appropriate for discharge with primary care follow up for further evaluation of symptoms. Percocet and Zofran Rx given for symptoms. Indications for ED return discussed with the patient who verbalizes comfort and understanding with plan.   Antony Madura, PA-C 10/31/12 1939

## 2012-10-31 NOTE — ED Notes (Signed)
Patient transported to Ultrasound 

## 2012-11-04 NOTE — ED Provider Notes (Signed)
Medical screening examination/treatment/procedure(s) were performed by non-physician practitioner and as supervising physician I was immediately available for consultation/collaboration.  Ethelda Chick, MD 11/04/12 [phone]

## 2012-11-10 ENCOUNTER — Telehealth: Payer: Self-pay

## 2012-11-10 NOTE — Telephone Encounter (Signed)
Prior authorization approved for Omeprazole effective 10/20/2012-11/10/2013. Approval was faxed to pharmacy.

## 2012-12-15 ENCOUNTER — Encounter (HOSPITAL_COMMUNITY): Payer: Self-pay | Admitting: Pharmacy Technician

## 2012-12-17 ENCOUNTER — Encounter (HOSPITAL_COMMUNITY)
Admission: RE | Admit: 2012-12-17 | Discharge: 2012-12-17 | Disposition: A | Discharge status: Home / Self Care | Payer: Worker's Compensation | Source: Ambulatory Visit | Attending: Orthopedic Surgery | Admitting: Orthopedic Surgery

## 2012-12-17 ENCOUNTER — Encounter (HOSPITAL_COMMUNITY): Payer: Self-pay

## 2012-12-17 ENCOUNTER — Telehealth: Payer: Self-pay | Admitting: Internal Medicine

## 2012-12-17 DIAGNOSIS — Z01812 Encounter for preprocedural laboratory examination: Secondary | ICD-10-CM | POA: Insufficient documentation

## 2012-12-17 DIAGNOSIS — Z01818 Encounter for other preprocedural examination: Secondary | ICD-10-CM | POA: Insufficient documentation

## 2012-12-17 HISTORY — DX: Anxiety disorder, unspecified: F41.9

## 2012-12-17 LAB — BASIC METABOLIC PANEL
Calcium: 10.2 mg/dL (ref 8.4–10.5)
Creatinine, Ser: 1.01 mg/dL (ref 0.50–1.35)
GFR calc Af Amer: >90 mL/min (ref >90)
GFR calc non Af Amer: 85 mL/min — ABNORMAL LOW (ref >90)
Glucose, Bld: 122 mg/dL — ABNORMAL HIGH (ref 70–99)
Potassium: 3.5 mEq/L (ref 3.5–5.1)
Sodium: 143 mEq/L (ref 135–145)

## 2012-12-17 LAB — HEPATIC FUNCTION PANEL
ALT: 68 U/L — ABNORMAL HIGH (ref 0–53)
AST: 37 U/L (ref 0–37)
Albumin: 4.6 g/dL (ref 3.5–5.2)
Total Bilirubin: 0.7 mg/dL (ref 0.3–1.2)

## 2012-12-17 LAB — CBC
MCH: 32.2 pg (ref 26.0–34.0)
MCHC: 35.3 g/dL (ref 30.0–36.0)
Platelets: 261 10*3/uL (ref 150–400)
RDW: 13 % (ref 11.5–15.5)

## 2012-12-17 NOTE — Progress Notes (Signed)
Pt hot and clammy. BP 166/109. p 91   He took his lotrel 5/20 mg in am.  Dr Debby Bud called information given to office. appt was made for 330pm tomorrow. Also at risk for OSA.  Will also follow up with Revonda Standard

## 2012-12-17 NOTE — H&P (Signed)
[name] is an 52 y.o. male.    Chief Complaint: right shoulder pain   HPI: Pt is a 52 y.o. male complaining of right shoulder pain for several months. Pain had continually increased since the beginning. X-rays in the clinic show rotator cuff tear right shoulder. Pt has tried various conservative treatments which have failed to alleviate their symptoms, including injections and therapy. Various options are discussed with the patient. Risks, benefits and expectations were discussed with the patient. Patient understand the risks, benefits and expectations and wishes to proceed with surgery.   PCP:  Illene Regulus, MD  D/C Plans:  Home   PMH: Past Medical History  Diagnosis Date  . Ureteral stent displacement 1990  . Hypertension   . Pancreatitis     Hx of   . Hyperlipemia   . GERD (gastroesophageal reflux disease)   . Alcohol abuse   . Arthritis   . Diverticulosis of colon (without mention of hemorrhage)   . Nephrolithiasis   . Kidney stones     PSH: Past Surgical History  Procedure Laterality Date  . Ureteroscopy      and stone retreval  . A2 pulley flexor sheath cyst 4th finger excisional biopsy      '03  . Anterior cruciate ligament repair      R knee  . Knee surgery  2008  . Esophagogastroduodenoscopy N/A 07/09/2012    Procedure: ESOPHAGOGASTRODUODENOSCOPY (EGD);  Surgeon: Louis Meckel, MD;  Location: [facility];  Service: Endoscopy;  Laterality: N/A;    Social History:  reports that he quit smoking about 14 years ago. His smoking use included Cigarettes. He smoked 0.00 packs per day. He has never used smokeless tobacco. He reports that  drinks alcohol. He reports that he does not use illicit drugs.  Allergies:  Allergies  Allergen Reactions  . Shrimp [Shellfish Allergy] Other (See Comments)    Cholesterol level becomes very high.  . Vicodin [Hydrocodone-Acetaminophen] Palpitations    Medications: No current facility-administered medications for this  encounter.   Current Outpatient Prescriptions  Medication Sig Dispense Refill  . amLODipine-benazepril (LOTREL) 5-20 MG per capsule Take 1 capsule by mouth every morning.      . Aspirin-Acetaminophen-Caffeine (EXCEDRIN PO) Take 2 tablets by mouth as needed (headache.).      [name] Chlorpheniramine-DM (CORICIDIN HBP COUGH/COLD PO) Take 1 tablet by mouth as needed (for cold symptoms).      . furosemide (LASIX) 20 MG tablet Take 20 mg by mouth daily.      . Menthol, Topical Analgesic, (ICY HOT EX) Apply 1 application topically daily as needed (for shoulder pain.).      [name] metaxalone (SKELAXIN) 800 MG tablet Take 800 mg by mouth as needed for pain.      . Multiple Vitamins-Minerals (MULTIVITAMIN WITH MINERALS) tablet Take 1 tablet by mouth daily.      [name] omeprazole (PRILOSEC) 20 MG capsule Take 20 mg by mouth daily.      . ondansetron (ZOFRAN) 4 MG tablet Take 4 mg by mouth every 6 (six) hours as needed for nausea.      [name] omeprazole (PRILOSEC) 20 MG capsule Take 20 mg by mouth daily.       [name] oxyCODONE-acetaminophen (PERCOCET/ROXICET) 5-325 MG per tablet Take 1 tablet by mouth every 6 (six) hours as needed for pain.        No results found for this or any previous visit (from the past 48 hour(s)). No results found.  ROS: Pain with  rom of the right upper extremity  Physical Exam: Alert and oriented 52 y.o. male in no acute distress Cranial nerves 2-12 intact Cervical spine: full rom with no tenderness, nv intact distally Chest: active breath sounds bilaterally, no wheeze rhonchi or rales Heart: regular rate and rhythm, no murmur Abd: non tender non distended with active bowel sounds Hip is stable with rom  Right upper extremity with mild decrease in rom due to pain Mild weakness with ER and IR nv intact distally  Assessment/Plan Assessment: right shoulder pain secondary to rotator cuff tear  Plan: Patient will undergo a right shoulder arthroscopy and rotator cuff repair by Dr. Ranell Patrick at  [facility]. Risks benefits and expectations were discussed with the patient. Patient understand risks, benefits and expectations and wishes to proceed.

## 2012-12-17 NOTE — Pre-Procedure Instructions (Signed)
VINCE AINSLEY  12/17/2012   Your procedure is scheduled on:  12/26/12  Report to [facility] at 830 AM.  Call this number if you have problems the morning of surgery: [phone]   Remember:   Do not eat food or drink liquids after midnight.   Take these medicines the morning of surgery with A SIP OF WATER: skelaxin,prilosec,oxycodone   Do not wear jewelry, make-up or nail polish.  Do not wear lotions, powders, or perfumes. You may wear deodorant.  Do not shave 48 hours prior to surgery. Men may shave face and neck.  Do not bring valuables to the hospital.  [facility] is not responsible                   for any belongings or valuables.  Contacts, dentures or bridgework may not be worn into surgery.  Leave suitcase in the car. After surgery it may be brought to your room.  For patients admitted to the hospital, checkout time is 11:00 AM the day of  discharge.   Patients discharged the day of surgery will not be allowed to drive  home.  Name and phone number of your driver: family  Special Instructions: Incentive Spirometry - Practice and bring it with you on the day of surgery.   Please read over the following fact sheets that you were given: Pain Booklet, Coughing and Deep Breathing and Surgical Site Infection Prevention

## 2012-12-17 NOTE — Telephone Encounter (Signed)
No changes today. Will address at appt tomorrow

## 2012-12-17 NOTE — Progress Notes (Addendum)
Anesthesia Chart Review:  Patient is a 52 year old male scheduled for right shoulder arthroscopy and subacromial decompression, mini open rotator cuff repair on 12/26/12 by Dr. Ranell Patrick.  History includes former smoker, HTN, ETOH abuse, pancreatitis, HLD, diverticulosis, GERD, nephrolithiasis, ureteral stent, anxiety, arthritis.  BMI 30.67.  PCP is Dr. Illene Regulus.    I was given chart to review after patient had left his PAT appointment.  His PAT RN stated that patient was hypertensive and was "hot and clammy" during his appointment.  He denied chest pain.  His PAT RN notified patient's PCP office, and Dr. Debby Bud is going to see patient tomorrow for follow-up.    EKG on 08/16/12 showed SR, poor anterior r wave progression, non-specific inferior ST abnormality.  CXR on 08/16/12 showed no evidence of acute cardiopulmonary disease.  Labs on 12/17/12 noted.  HFP added due to ETOH abuse history.  ALT remains mildly elevated.  PLT count is WNL.   I'll follow-up additional notes from Dr. Debby Bud once available.  Velna Ochs [facility]/Anesthesiology Phone [phone] 12/17/2012 4:22 PM  Addendum: 12/24/2012 9:30 AM Since my note, patient was seen by Dr. Debby Bud for preoperative clearance and HTN follow-up.  Lotrel was discontinued and he was started on amlodipine 10 mg daily, Benicar 40 mg daily, and furosemide was increased to 40 mg daily for his anti-hypertensive regimen.  He was also referred for a nuclear stress test was was done on 12/22/2012 and showed: "Normal stress nuclear study. The patient had an early marked hypertensive response to stress (resting 156/107; stress 231/110). There was no chest pain. There was no EKG change. This is a low risk scan. The patient's blood pressure response will need to be treated over time. LV Ejection Fraction: 54%. LV Wall Motion: Normal Wall Motion." (Dr. Myrtis Ser)    Patient's resting BP on 12/22/12 was 156/107.  He had taken Benicar and  amlodipine but not furosemide. I spoke with Clydie Braun at Dr. Ranell Patrick' office.  They have received a note of surgical clearance from Dr. Debby Bud.  I notified her of patient's elevated BP readings, and discussed that if he presents with a significantly elevated BP on the day of surgery that there is still a potential that he could be canceled.   Currently, patient is not a first case.  PAT RN spoke with patient this morning to instruct him to take amlodipine and Benicar on the morning of surgery per my instruction.

## 2012-12-17 NOTE — Telephone Encounter (Signed)
[name] called from [facility] stated that pt came in for pre- dmission for surgery today and his BP is high 166/109. The nurse is concern about his BP and was wondering what Dr. Debby Bud would like to do. Pt has an appt with Dr. Debby Bud 12/18/12 at 3:30 pm. [name] also wants to inform Dr. Debby Bud that pt did not pass the sleep apnea test. Please call pt.

## 2012-12-18 ENCOUNTER — Encounter: Payer: Self-pay | Admitting: Internal Medicine

## 2012-12-18 ENCOUNTER — Ambulatory Visit (INDEPENDENT_AMBULATORY_CARE_PROVIDER_SITE_OTHER): Payer: BC Managed Care – PPO | Admitting: Internal Medicine

## 2012-12-18 VITALS — BP 168/110 | HR 81 | Temp 97.9°F | Wt 229.0 lb

## 2012-12-18 DIAGNOSIS — R079 Chest pain, unspecified: Secondary | ICD-10-CM

## 2012-12-18 DIAGNOSIS — I10 Essential (primary) hypertension: Secondary | ICD-10-CM

## 2012-12-18 DIAGNOSIS — R0789 Other chest pain: Secondary | ICD-10-CM

## 2012-12-18 MED ORDER — FUROSEMIDE 20 MG PO TABS: 40.0000 mg | ORAL_TABLET | Freq: Every day | ORAL | Status: DC

## 2012-12-18 MED ORDER — AMLODIPINE BESYLATE 10 MG PO TABS: 10.0000 mg | ORAL_TABLET | Freq: Every day | ORAL | Status: DC

## 2012-12-18 NOTE — Patient Instructions (Addendum)
Blood pressure - too high. Plan  Stop Lotrel.  Amlodipine 10 mg once a day  Benicare 40 mg once a day  Increase Laxix (furosemide) to 40 mg once a day  Send me a message Sunday PM reporting on your blood pressure (you may want to get a blood pressure monitor)  Heart  You need cardiac clearance for surgery. You have told me about chest pain that can be worrisome. Plan Nuclear stress test on Monday Morning at [facility] on [address].

## 2012-12-18 NOTE — Progress Notes (Signed)
Subjective:    Patient ID: [name], male    DOB: 1960-05-19, 52 y.o.   MRN: [id number]  HPI [name] has a torn right rotator cuff and is scheduled for surgery next Friday. His blood pressure is too high to have surgery. He has had a lot of pain.  He does report having substernal chest pain with exertion along with increased sweating and DOE. He denies epistaxsis, double vision. He has had occipital headaches over the past week. He has been under a lot of stress. He is not sleeping well.   Past Medical History  Diagnosis Date  . Ureteral stent displacement 1990  . Pancreatitis     Hx of   . Hyperlipemia   . GERD (gastroesophageal reflux disease)   . Alcohol abuse   . Arthritis   . Diverticulosis of colon (without mention of hemorrhage)   . Nephrolithiasis   . Kidney stones   . Hypertension     dr Kathlene November  norins  . Anxiety    Past Surgical History  Procedure Laterality Date  . Ureteroscopy      and stone retreval  . A2 pulley flexor sheath cyst 4th finger excisional biopsy      '03  . Anterior cruciate ligament repair      R knee  . Knee surgery  2008  . Esophagogastroduodenoscopy N/A 07/09/2012    Procedure: ESOPHAGOGASTRODUODENOSCOPY (EGD);  Surgeon: Louis Meckel, MD;  Location: [facility];  Service: Endoscopy;  Laterality: N/A;   Family History  Problem Relation Age of Onset  . Hypertension Father     X4 siblings  . Diabetes Mother     x2 brothers  . Heart disease Father    History   Social History  . Marital Status: Married    Spouse Name: N/A    Number of Children: 3  . Years of Education: N/A   Occupational History  .  Retired    Pharmacologist    Social History Main Topics  . Smoking status: Former Smoker    Types: Cigarettes    Quit date: 07/10/1998  . Smokeless tobacco: Never Used  . Alcohol Use: 0.0 oz/week     Comment: daily  . Drug Use: No  . Sexual Activity: Yes   Other Topics Concern  . Not on file   Social History Narrative   HS[employer]   [employer] groundskeeper   Difficult domestic situation, Married '87- separated '01   2 sons- '82, '91; 1 daughter '91   His niece is 81 with metastatic breast cancer (feb '12)    Current Outpatient Prescriptions on File Prior to Visit  Medication Sig Dispense Refill  . amLODipine-benazepril (LOTREL) 5-20 MG per capsule Take 1 capsule by mouth every morning.      . Aspirin-Acetaminophen-Caffeine (EXCEDRIN PO) Take 2 tablets by mouth as needed (headache.).      [name] Chlorpheniramine-DM (CORICIDIN HBP COUGH/COLD PO) Take 1 tablet by mouth as needed (for cold symptoms).      . furosemide (LASIX) 20 MG tablet Take 20 mg by mouth daily.      . Menthol, Topical Analgesic, (ICY HOT EX) Apply 1 application topically daily as needed (for shoulder pain.).      [name] metaxalone (SKELAXIN) 800 MG tablet Take 800 mg by mouth as needed for pain.      . Multiple Vitamins-Minerals (MULTIVITAMIN WITH MINERALS) tablet Take 1 tablet by mouth daily.      [name] omeprazole (PRILOSEC) 20 MG capsule Take  20 mg by mouth daily.      . ondansetron (ZOFRAN) 4 MG tablet Take 4 mg by mouth every 6 (six) hours as needed for nausea.      [name] oxyCODONE-acetaminophen (PERCOCET/ROXICET) 5-325 MG per tablet Take 1 tablet by mouth every 6 (six) hours as needed for pain.      [name] omeprazole (PRILOSEC) 20 MG capsule Take 20 mg by mouth daily.        No current facility-administered medications on file prior to visit.      Review of Systems System review is negative for any constitutional, cardiac, pulmonary, GI or neuro symptoms or complaints other than as described in the HPI.      Objective:   Physical Exam Filed Vitals:   12/18/12 1535  BP: 168/110  Pulse: 81  Temp: 97.9 F (36.6 C)   Gen'l- heavy set AA man in no distress HEENT- C&S clear Cor- RRR Pulm - normal respirations Abd- soft, non-tender Neuro- A&O x 3       Assessment & Plan:  Cardiac clearance for Surgery - patient with multiple cardiac risk factors with a  report of exertional CP along with diaphoresis and SOB  Plan Myoview stress test for Monday, August 18th. Patient aware of appointment.

## 2012-12-21 ENCOUNTER — Encounter: Payer: Self-pay | Admitting: Internal Medicine

## 2012-12-22 ENCOUNTER — Other Ambulatory Visit (HOSPITAL_COMMUNITY): Payer: Self-pay | Admitting: *Deleted

## 2012-12-22 ENCOUNTER — Ambulatory Visit (HOSPITAL_COMMUNITY): Payer: BC Managed Care – PPO | Attending: Internal Medicine | Admitting: Radiology

## 2012-12-22 VITALS — BP 156/107 | Ht 72.0 in | Wt 225.0 lb

## 2012-12-22 DIAGNOSIS — Z8249 Family history of ischemic heart disease and other diseases of the circulatory system: Secondary | ICD-10-CM | POA: Insufficient documentation

## 2012-12-22 DIAGNOSIS — I10 Essential (primary) hypertension: Secondary | ICD-10-CM | POA: Insufficient documentation

## 2012-12-22 DIAGNOSIS — R0989 Other specified symptoms and signs involving the circulatory and respiratory systems: Secondary | ICD-10-CM | POA: Insufficient documentation

## 2012-12-22 DIAGNOSIS — R000 Tachycardia, unspecified: Secondary | ICD-10-CM | POA: Insufficient documentation

## 2012-12-22 DIAGNOSIS — R0609 Other forms of dyspnea: Secondary | ICD-10-CM | POA: Insufficient documentation

## 2012-12-22 DIAGNOSIS — R079 Chest pain, unspecified: Secondary | ICD-10-CM

## 2012-12-22 DIAGNOSIS — R0789 Other chest pain: Secondary | ICD-10-CM | POA: Insufficient documentation

## 2012-12-22 DIAGNOSIS — R61 Generalized hyperhidrosis: Secondary | ICD-10-CM | POA: Insufficient documentation

## 2012-12-22 DIAGNOSIS — R0602 Shortness of breath: Secondary | ICD-10-CM | POA: Insufficient documentation

## 2012-12-22 DIAGNOSIS — R002 Palpitations: Secondary | ICD-10-CM | POA: Insufficient documentation

## 2012-12-22 DIAGNOSIS — I4949 Other premature depolarization: Secondary | ICD-10-CM

## 2012-12-22 DIAGNOSIS — R5381 Other malaise: Secondary | ICD-10-CM | POA: Insufficient documentation

## 2012-12-22 MED ORDER — TECHNETIUM TC 99M SESTAMIBI GENERIC - CARDIOLITE
10.0000 | Freq: Once | INTRAVENOUS | Status: AC | PRN
  Administered 2012-12-22: 10 via INTRAVENOUS

## 2012-12-22 MED ORDER — TECHNETIUM TC 99M SESTAMIBI GENERIC - CARDIOLITE
30.0000 | Freq: Once | INTRAVENOUS | Status: AC | PRN
  Administered 2012-12-22: 30 via INTRAVENOUS

## 2012-12-22 NOTE — Progress Notes (Signed)
[facility] SITE 3 NUCLEAR MED [address] [place], [place] [address] [phone]    Cardiology Nuclear Med Study  [name] is a 52 y.o. male     MRN : [id number]     DOB: [birth year]  Procedure Date: 12/22/2012  Nuclear Med Background Indication for Stress Test:  Evaluation for Ischemia, and Surgical Clearance for  Shoulder on 12-26-12 by Dr. Malon Kindle History:  none Cardiac Risk Factors: Family History - CAD, History of Smoking, Hypertension and Lipids  Symptoms:  Chest Pain with Exertion (last date of chest discomfort 2 weeks ago), Diaphoresis, DOE, Fatigue, Palpitations and Rapid HR   Nuclear Pre-Procedure Caffeine/Decaff Intake:  None > 12 hrs NPO After: 6:00pm   Lungs:  clear O2 Sat: 95% on room air. IV 0.9% NS with Angio Cath:  20g  IV Site: R Antecubital x 1, tolerated well IV Started by:  Irean Hong, RN  Chest Size (in):  44 Cup Size: n/a  Height: 6' (1.829 m)  Weight:  225 lb (102.059 kg)  BMI:  Body mass index is 30.51 kg/(m^2). 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; held lasix and excedrin    Nuclear Med Study 1 or 2 day study: 1 day  Stress Test Type:  Stress  Reading MD: Willa Rough, MD  Order Authorizing Provider:  Illene Regulus,  MD  Resting Radionuclide: Technetium 61m Sestamibi  Resting Radionuclide Dose: 11.0 mCi   Stress Radionuclide:  Technetium 73m Sestamibi  Stress Radionuclide Dose: 33.0 mCi           Stress Protocol Rest HR: 75 Stress HR: 155  Rest BP: 156/107 Stress BP: 231/110  Exercise Time (min): 5:31 METS: 7.0   Predicted Max HR: 170 bpm % Max HR: 91.18 bpm Rate Pressure Product: 21308   Dose of Adenosine (mg):  n/a Dose of Lexiscan: n/a mg  Dose of Atropine (mg): n/a Dose of Dobutamine: n/a mcg/kg/min (at max HR)  Stress Test Technologist: Cathlyn Parsons, RN  Nuclear Technologist:  Doyne Keel, CNMT     Rest Procedure:  Myocardial perfusion imaging was performed at rest 45 minutes following  the intravenous administration of Technetium 46m Sestamibi. Rest ECG: Normal sinus rhythm. Cannot rule out old anterior MI.  Stress Procedure:  The patient exercised on the treadmill utilizing the Bruce Protocol for 5:31 minutes. The patient stopped due to Hypertension and denied any chest pain.  Technetium 17m Sestamibi was injected at peak exercise and myocardial perfusion imaging was performed after a brief delay. Stress ECG: No significant change from baseline ECG  QPS Raw Data Images:  Normal; no motion artifact; normal heart/lung ratio. Stress Images:  Normal homogeneous uptake in all areas of the myocardium. Rest Images:  Normal homogeneous uptake in all areas of the myocardium. Subtraction (SDS):  No evidence of ischemia. Transient Ischemic Dilatation (Normal <1.22):  n/a Lung/Heart Ratio (Normal <0.45):  0.32  Quantitative Gated Spect Images QGS EDV:  122 ml QGS ESV:  56 ml  Impression Exercise Capacity:  The patient has decreased exercise capacity. In addition he has a marked hypertensive response to stress. BP Response:  Marked systolic hypertensive response to stress. Clinical Symptoms:  Mild shortness of breath. ECG Impression:  No significant ST segment change suggestive of ischemia. Comparison with Prior Nuclear Study: No previous nuclear study performed  Overall Impression:  Normal stress nuclear study. The patient had an early marked hypertensive response to stress. There was no chest pain. There was no EKG  change. This is a low risk scan. The patient's blood pressure response will need to be treated over time.  LV Ejection Fraction: 54%.  LV Wall Motion:  Normal Wall Motion.  Willa Rough, MD

## 2012-12-23 NOTE — Assessment & Plan Note (Signed)
Blood pressure - too high. Plan  Stop Lotrel.  Amlodipine 10 mg once a day  Benicare 40 mg once a day  Increase Laxix (furosemide) to 40 mg once a day  Send me a message Sunday PM reporting on your blood pressure (you may want to get a blood pressure monitor)

## 2012-12-25 MED ORDER — CEFAZOLIN SODIUM-DEXTROSE 2-3 GM-% IV SOLR
2.0000 g | INTRAVENOUS | Status: AC
  Administered 2012-12-26: 2 g via INTRAVENOUS
  Filled 2012-12-25: qty 50

## 2012-12-26 ENCOUNTER — Ambulatory Visit (HOSPITAL_COMMUNITY)
Admission: RE | Admit: 2012-12-26 | Discharge: 2012-12-26 | Disposition: A | Discharge status: Home / Self Care | Payer: Worker's Compensation | Source: Ambulatory Visit | Attending: Orthopedic Surgery | Admitting: Orthopedic Surgery

## 2012-12-26 ENCOUNTER — Encounter (HOSPITAL_COMMUNITY): Payer: Self-pay | Admitting: Surgery

## 2012-12-26 ENCOUNTER — Ambulatory Visit (HOSPITAL_COMMUNITY): Payer: Worker's Compensation | Admitting: Anesthesiology

## 2012-12-26 ENCOUNTER — Encounter (HOSPITAL_COMMUNITY)
Admission: RE | Disposition: A | Discharge status: Home / Self Care | Payer: Self-pay | Source: Ambulatory Visit | Attending: Orthopedic Surgery

## 2012-12-26 ENCOUNTER — Encounter (HOSPITAL_COMMUNITY): Payer: Self-pay | Admitting: Vascular Surgery

## 2012-12-26 DIAGNOSIS — M19019 Primary osteoarthritis, unspecified shoulder: Secondary | ICD-10-CM | POA: Insufficient documentation

## 2012-12-26 DIAGNOSIS — E785 Hyperlipidemia, unspecified: Secondary | ICD-10-CM | POA: Insufficient documentation

## 2012-12-26 DIAGNOSIS — I10 Essential (primary) hypertension: Secondary | ICD-10-CM | POA: Insufficient documentation

## 2012-12-26 DIAGNOSIS — E669 Obesity, unspecified: Secondary | ICD-10-CM | POA: Insufficient documentation

## 2012-12-26 DIAGNOSIS — Z87891 Personal history of nicotine dependence: Secondary | ICD-10-CM | POA: Insufficient documentation

## 2012-12-26 DIAGNOSIS — S43439A Superior glenoid labrum lesion of unspecified shoulder, initial encounter: Secondary | ICD-10-CM | POA: Insufficient documentation

## 2012-12-26 DIAGNOSIS — F1010 Alcohol abuse, uncomplicated: Secondary | ICD-10-CM | POA: Insufficient documentation

## 2012-12-26 DIAGNOSIS — Z87442 Personal history of urinary calculi: Secondary | ICD-10-CM | POA: Insufficient documentation

## 2012-12-26 DIAGNOSIS — Z885 Allergy status to narcotic agent status: Secondary | ICD-10-CM | POA: Insufficient documentation

## 2012-12-26 DIAGNOSIS — S43429A Sprain of unspecified rotator cuff capsule, initial encounter: Secondary | ICD-10-CM | POA: Insufficient documentation

## 2012-12-26 DIAGNOSIS — X58XXXA Exposure to other specified factors, initial encounter: Secondary | ICD-10-CM | POA: Insufficient documentation

## 2012-12-26 DIAGNOSIS — Z91013 Allergy to seafood: Secondary | ICD-10-CM | POA: Insufficient documentation

## 2012-12-26 DIAGNOSIS — K5730 Diverticulosis of large intestine without perforation or abscess without bleeding: Secondary | ICD-10-CM | POA: Insufficient documentation

## 2012-12-26 DIAGNOSIS — S43499A Other sprain of unspecified shoulder joint, initial encounter: Secondary | ICD-10-CM | POA: Insufficient documentation

## 2012-12-26 DIAGNOSIS — Z79899 Other long term (current) drug therapy: Secondary | ICD-10-CM | POA: Insufficient documentation

## 2012-12-26 DIAGNOSIS — Y929 Unspecified place or not applicable: Secondary | ICD-10-CM | POA: Insufficient documentation

## 2012-12-26 DIAGNOSIS — Z7982 Long term (current) use of aspirin: Secondary | ICD-10-CM | POA: Insufficient documentation

## 2012-12-26 DIAGNOSIS — K219 Gastro-esophageal reflux disease without esophagitis: Secondary | ICD-10-CM | POA: Insufficient documentation

## 2012-12-26 HISTORY — PX: SHOULDER ARTHROSCOPY WITH SUBACROMIAL DECOMPRESSION AND OPEN ROTATOR C: SHX5688

## 2012-12-26 SURGERY — SHOULDER ARTHROSCOPY WITH SUBACROMIAL DECOMPRESSION AND OPEN ROTATOR CUFF REPAIR, OPEN BICEPS TENDON REPAIR
Anesthesia: General | Site: Shoulder | Laterality: Right | Wound class: Clean

## 2012-12-26 MED ORDER — CHLORHEXIDINE GLUCONATE 4 % EX LIQD: 60.0000 mL | Freq: Once | CUTANEOUS | Status: DC

## 2012-12-26 MED ORDER — SODIUM CHLORIDE 0.9 % IV SOLN
10.0000 mg | INTRAVENOUS | Status: DC | PRN
  Administered 2012-12-26: 15 ug/min via INTRAVENOUS

## 2012-12-26 MED ORDER — GLYCOPYRROLATE 0.2 MG/ML IJ SOLN
INTRAMUSCULAR | Status: DC | PRN
  Administered 2012-12-26: 0.4 mg via INTRAVENOUS

## 2012-12-26 MED ORDER — SODIUM CHLORIDE 0.9 % IR SOLN
Status: DC | PRN
  Administered 2012-12-26: 6000 mL

## 2012-12-26 MED ORDER — ROCURONIUM BROMIDE 100 MG/10ML IV SOLN
INTRAVENOUS | Status: DC | PRN
  Administered 2012-12-26: 50 mg via INTRAVENOUS

## 2012-12-26 MED ORDER — LIDOCAINE HCL (CARDIAC) 20 MG/ML IV SOLN
INTRAVENOUS | Status: DC | PRN
  Administered 2012-12-26: 40 mg via INTRAVENOUS

## 2012-12-26 MED ORDER — BUPIVACAINE-EPINEPHRINE 0.25% -1:200000 IJ SOLN
INTRAMUSCULAR | Status: DC | PRN
  Administered 2012-12-26: 50 mL

## 2012-12-26 MED ORDER — BUPIVACAINE-EPINEPHRINE PF 0.5-1:200000 % IJ SOLN
INTRAMUSCULAR | Status: DC | PRN
  Administered 2012-12-26: 30 mL

## 2012-12-26 MED ORDER — MIDAZOLAM HCL 5 MG/5ML IJ SOLN
INTRAMUSCULAR | Status: DC | PRN
  Administered 2012-12-26: 2 mg via INTRAVENOUS

## 2012-12-26 MED ORDER — OXYCODONE HCL 5 MG PO TABS: 5.0000 mg | ORAL_TABLET | Freq: Once | ORAL | Status: DC | PRN

## 2012-12-26 MED ORDER — MIDAZOLAM HCL 2 MG/2ML IJ SOLN: 0.5000 mg | Freq: Once | INTRAMUSCULAR | Status: DC | PRN

## 2012-12-26 MED ORDER — PROMETHAZINE HCL 25 MG/ML IJ SOLN: 6.2500 mg | INTRAMUSCULAR | Status: DC | PRN

## 2012-12-26 MED ORDER — HYDROMORPHONE HCL PF 1 MG/ML IJ SOLN: 0.2500 mg | INTRAMUSCULAR | Status: DC | PRN

## 2012-12-26 MED ORDER — OXYCODONE HCL 5 MG/5ML PO SOLN: 5.0000 mg | Freq: Once | ORAL | Status: DC | PRN

## 2012-12-26 MED ORDER — OXYCODONE-ACETAMINOPHEN 5-325 MG PO TABS: 1.0000 | ORAL_TABLET | ORAL | Status: DC | PRN

## 2012-12-26 MED ORDER — NEOSTIGMINE METHYLSULFATE 1 MG/ML IJ SOLN
INTRAMUSCULAR | Status: DC | PRN
  Administered 2012-12-26: 3 mg via INTRAVENOUS

## 2012-12-26 MED ORDER — BUPIVACAINE-EPINEPHRINE PF 0.25-1:200000 % IJ SOLN
INTRAMUSCULAR | Status: AC
  Filled 2012-12-26: qty 30

## 2012-12-26 MED ORDER — METHOCARBAMOL 500 MG PO TABS: 500.0000 mg | ORAL_TABLET | Freq: Three times a day (TID) | ORAL | Status: DC | PRN

## 2012-12-26 MED ORDER — FENTANYL CITRATE 0.05 MG/ML IJ SOLN
INTRAMUSCULAR | Status: DC | PRN
  Administered 2012-12-26: 50 ug via INTRAVENOUS
  Administered 2012-12-26: 200 ug via INTRAVENOUS

## 2012-12-26 MED ORDER — ONDANSETRON HCL 4 MG/2ML IJ SOLN
INTRAMUSCULAR | Status: DC | PRN
  Administered 2012-12-26: 4 mg via INTRAVENOUS

## 2012-12-26 MED ORDER — PROPOFOL 10 MG/ML IV BOLUS
INTRAVENOUS | Status: DC | PRN
  Administered 2012-12-26: 200 mg via INTRAVENOUS

## 2012-12-26 MED ORDER — LACTATED RINGERS IV SOLN
INTRAVENOUS | Status: DC | PRN
  Administered 2012-12-26 (×2): via INTRAVENOUS

## 2012-12-26 MED ORDER — MEPERIDINE HCL 25 MG/ML IJ SOLN: 6.2500 mg | INTRAMUSCULAR | Status: DC | PRN

## 2012-12-26 SURGICAL SUPPLY — 60 items
ANCH SUT 2 1.3X1 LD 1 STRN (Anchor) ×1 IMPLANT
ANCH SUT 360D 2 2 LD 2 STRN (Anchor) ×1 IMPLANT
ANCHOR ALL- SUT RC 2 SUT Y-K (Anchor) ×1 IMPLANT
ANCHOR ALL-SUT FLEX 1.3 Y-KNOT (Anchor) ×2 IMPLANT
ANCHOR ALL-SUT RC 2 SUT Y-K (Anchor) IMPLANT
BIT DRILL 1.3M DISPOSABLE (BIT) ×1 IMPLANT
BLADE LONG MED 31X9 (MISCELLANEOUS) ×1 IMPLANT
BLADE SURG 11 STRL SS (BLADE) ×2 IMPLANT
BUR OVAL 4.0 (BURR) ×1 IMPLANT
CLOTH BEACON ORANGE TIMEOUT ST (SAFETY) ×2 IMPLANT
COVER SURGICAL LIGHT HANDLE (MISCELLANEOUS) ×2 IMPLANT
DRAPE INCISE IOBAN 66X45 STRL (DRAPES) ×2 IMPLANT
DRAPE STERI 35X30 U-POUCH (DRAPES) ×2 IMPLANT
DRAPE U-SHAPE 47X51 STRL (DRAPES) ×2 IMPLANT
DRSG EMULSION OIL 3X3 NADH (GAUZE/BANDAGES/DRESSINGS) ×3 IMPLANT
DRSG PAD ABDOMINAL 8X10 ST (GAUZE/BANDAGES/DRESSINGS) ×2 IMPLANT
DURAPREP 26ML APPLICATOR (WOUND CARE) ×2 IMPLANT
ELECT REM PT RETURN 9FT ADLT (ELECTROSURGICAL)
ELECTRODE REM PT RTRN 9FT ADLT (ELECTROSURGICAL) IMPLANT
GLOVE BIOGEL PI ORTHO PRO 7.5 (GLOVE) ×2
GLOVE BIOGEL PI ORTHO PRO SZ8 (GLOVE) ×1
GLOVE ORTHO TXT STRL SZ7.5 (GLOVE) ×3 IMPLANT
GLOVE PI ORTHO PRO STRL 7.5 (GLOVE) ×1 IMPLANT
GLOVE PI ORTHO PRO STRL SZ8 (GLOVE) ×1 IMPLANT
GLOVE SURG ORTHO 8.5 STRL (GLOVE) ×2 IMPLANT
GOWN STRL REIN XL XLG (GOWN DISPOSABLE) ×8 IMPLANT
KIT BASIN OR (CUSTOM PROCEDURE TRAY) ×2 IMPLANT
KIT ROOM TURNOVER OR (KITS) ×2 IMPLANT
MANIFOLD NEPTUNE II (INSTRUMENTS) ×2 IMPLANT
NDL HYPO 25GX1X1/2 BEV (NEEDLE) ×1 IMPLANT
NDL SPNL 18GX3.5 QUINCKE PK (NEEDLE) ×1 IMPLANT
NDL SUT .5 MAYO 1.404X.05X (NEEDLE) ×1 IMPLANT
NDL SUT 6 .5 CRC .975X.05 MAYO (NEEDLE) ×1 IMPLANT
NEEDLE HYPO 25GX1X1/2 BEV (NEEDLE) ×2 IMPLANT
NEEDLE MAYO TAPER (NEEDLE) ×4
NEEDLE SPNL 18GX3.5 QUINCKE PK (NEEDLE) ×2 IMPLANT
NS IRRIG 1000ML POUR BTL (IV SOLUTION) ×3 IMPLANT
PACK SHOULDER (CUSTOM PROCEDURE TRAY) ×2 IMPLANT
PAD ARMBOARD 7.5X6 YLW CONV (MISCELLANEOUS) ×4 IMPLANT
RESECTOR FULL RADIUS 4.2MM (BLADE) ×2 IMPLANT
SET ARTHROSCOPY TUBING (MISCELLANEOUS) ×2
SET ARTHROSCOPY TUBING LN (MISCELLANEOUS) ×1 IMPLANT
SLING ARM FOAM STRAP LRG (SOFTGOODS) ×2 IMPLANT
SLING ARM FOAM STRAP MED (SOFTGOODS) IMPLANT
SPONGE GAUZE 4X4 12PLY (GAUZE/BANDAGES/DRESSINGS) ×2 IMPLANT
STRIP CLOSURE SKIN 1/2X4 (GAUZE/BANDAGES/DRESSINGS) ×2 IMPLANT
SUT FIBERWIRE #2 38 T-5 BLUE (SUTURE) ×4
SUT MNCRL AB 3-0 PS2 18 (SUTURE) ×2 IMPLANT
SUT VIC AB 0 CT2 27 (SUTURE) ×2 IMPLANT
SUT VIC AB 2-0 CT1 27 (SUTURE) ×2
SUT VIC AB 2-0 CT1 TAPERPNT 27 (SUTURE) ×1 IMPLANT
SUT VICRYL 0 CT 1 36IN (SUTURE) ×2 IMPLANT
SUTURE FIBERWR #2 38 T-5 BLUE (SUTURE) IMPLANT
SYR CONTROL 10ML LL (SYRINGE) ×2 IMPLANT
TAPE CLOTH SURG 4X10 WHT LF (GAUZE/BANDAGES/DRESSINGS) ×1 IMPLANT
TOWEL OR 17X24 6PK STRL BLUE (TOWEL DISPOSABLE) ×2 IMPLANT
TOWEL OR 17X26 10 PK STRL BLUE (TOWEL DISPOSABLE) ×2 IMPLANT
TUBE CONNECTING 12X1/4 (SUCTIONS) ×2 IMPLANT
WAND 90 DEG TURBOVAC W/CORD (SURGICAL WAND) ×2 IMPLANT
WATER STERILE IRR 1000ML POUR (IV SOLUTION) ×2 IMPLANT

## 2012-12-26 NOTE — Op Note (Signed)
NAMEJAQUAY, POSTHUMUS NO.:  [id number]  MEDICAL RECORD NO.:  [id number]  LOCATION:  [facility]                         FACILITY:  [facility]  PHYSICIAN:  Almedia Balls. Ranell Patrick, M.D. DATE OF BIRTH:  04/03/1961  DATE OF PROCEDURE:  12/26/2012 DATE OF DISCHARGE:                              OPERATIVE REPORT   PREOPERATIVE DIAGNOSIS:  Right shoulder rotator cuff tear, superior labrum anterior and posterior lesion, acromioclavicular joint arthritis.  POSTOPERATIVE DIAGNOSES: 1. Right shoulder rotator cuff tear. 2. Right shoulder pan labral tear and superior labrum anterior and     posterior lesion with biceps tendon tear. 3. 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  PROCEDURE PERFORMED:  Right shoulder arthroscopy with extensive intra- articular debridement including debridement of torn superior labrum, anterior-posterior arthroscopic biceps tenotomy, debridement of pan labral tear, arthroscopic subacromial decompression with mini open rotator cuff repair, biceps tenodesis in the groove, and open distal clavicle resection.  ATTENDING SURGEON:  Almedia Balls. Ranell Patrick, MD.  ASSISTANT:  Donnie Coffin. Dixon, PA-C, who was scrubbed the entire procedure and necessary for satisfactory completion of surgery.  ANESTHESIA:  General anesthesia was used plus interscalene block.  ESTIMATED BLOOD LOSS:  Minimal.  FLUID REPLACED:  1500 mL crystalloid.  INSTRUMENT COUNTS:  Correct.  COMPLICATIONS:  There were no complications.  ANTIBIOTICS:  Perioperative antibiotics were given.  INDICATIONS:  The patient is a 52 year old male with a history of worsening right shoulder pain secondary to rotator cuff tear after work injury.  The patient also has symptomatic AC joint arthritis, and also a SLAP lesion.  The patient has failed conservative management and desires operative treatment to restore function, eliminate pain in the shoulder. Informed consent obtained.  DESCRIPTION  OF PROCEDURE:  After an adequate level of anesthesia was achieved, the patient was positioned in a modified beach-chair position. Right shoulder correctly identified.  Time-out was called.  Sterile prep and drape of the shoulder and arm performed.  We did examine the shoulder prior to surgery and this revealed full passive range of motion with no undue stiffness or instability.  We then proceeded with the arthroscopy with anterior, lateral and posterior portals, introduced in a similar fashion.  Infiltrated the skin with 0.25% Marcaine with epinephrine followed by incision with the 11 blade scalpel and introduction cannula into the joint using blunt obturators.  We identified significant tearing in the superior labral biceps anchor area.  The biceps was torn about 50% of the way through when I had completed the biceps tenotomy with a basket forceps.  We also did a thorough superior labral debridement back to stable labral tissue.  The anterior-inferior and posterior-inferior labrum was torn as well.  We performed a labral debridement done there with baskets and shaver. Minimal chondromalacia noted inferiorly and the anterior subscapularis was intact.  Rotator cuff was torn.  This appeared to be a large V- shaped tear.  Posteriorly, the teres minor was intact.  We did go ahead and placed a scope in subacromial space.  Thorough bursectomy and acromioplasty was performed creating a type 1 acromial shape with thorough decompression out anteriorly and laterally.  I inspected the cuff again and a V shape tear  full thickness was noted.  We concluded the arthroscopic portion of the procedure.  We then made a small saber incision overlying the AC joint.  Dissection down to subcutaneous tissues, identified the deltotrapezius fascia, divided that in line with distal clavicle.  Subperiosteal dissection of the distal clavicle performed followed by excision of the distal 2 mm or 3 mm using oscillating  saw.  We thoroughly irrigated the AC interval with plain bone wax and cut into the clavicle.  We then repaired the deltotrapezial fascia anatomically with 0 Vicryl suture figure-of-eight followed by 2-0 Vicryl subcutaneous closure and 4-0 Monocryl for skin.  We dressed the rotator cuff and biceps through a single mini open incision.  We started the anterolateral border of the acromion staying distally about 3 cm. Dissection down through subcutaneous tissues.  We split the deltoid in its raphe between the anterior and lateral heads using a needle-tip Bovie.  We  placed Arthrex retractor, identified the biceps groove and delivered that out of the wound.  We went ahead and prepared the floor of the bicipital groove with the Bovie and the Therapist, nutritional.  We went ahead and inserted the Y-knot anchor, and Linvatec through the floor of the groove, brought that up in a whipstitch fashion through the biceps tendon tenodesing the mid tension with the elbow at 90 degrees, over sewed the soft tissue with 0 Vicryl suture.  Next, we approached the rotator cuff tear which was a deep V-shaped tear with margin converging medially with #2 FiberWire, and then used a single Y-Knot RCA anchor out laterally, double loaded with Hi-fi suture.  We mobilized the poster portion of tendon bringing that back up in alignment where the anterior portion of the tendon was intact.  Basically, the tear with side-to-side repair with the anchor and we also did a suture through drill holes to free edge out laterally.  We additionally used this suture that had been tied for the biceps tenodesis and brought that up in mattress fashion over the top of the entire repair for additional reinforcement.  We ranged his shoulder fully, no motion at the repair site, no impingement. We thoroughly irrigated the subdeltoid interval repairing the deltoid anatomically with 0 Vicryl suture followed by 2-0 Vicryl subcutaneous closure and 4-0  Monocryl for skin and portals.  The patient tolerated the procedure well.     Almedia Balls. Ranell Patrick, M.D.     SRN/MEDQ  D:  12/26/2012  T:  12/26/2012  Job:  [id number]

## 2012-12-26 NOTE — Anesthesia Postprocedure Evaluation (Signed)
  Anesthesia Post-op Note  Patient: [name]  Procedure(s) Performed: Procedure(s): RIGHT SHOULDER ARTHROSCOPY WITH SUBACROMIAL DECOMPRESSION MINI OPEN ROTATOR CUFF REPAIR, OPEN BICEP TENODESIS OPEN DISTAL CLAVICLE RESECTION   (Right)  Patient Location: PACU  Anesthesia Type:GA combined with regional for post-op pain  Level of Consciousness: awake, alert , oriented and patient cooperative  Airway and Oxygen Therapy: Patient Spontanous Breathing and Patient connected to nasal cannula oxygen  Post-op Pain: none  Post-op Assessment: Post-op Vital signs reviewed, Patient's Cardiovascular Status Stable, Respiratory Function Stable, Patent Airway, No signs of Nausea or vomiting and Pain level controlled  Post-op Vital Signs: Reviewed and stable  Complications: No apparent anesthesia complications

## 2012-12-26 NOTE — Preoperative (Signed)
Beta Blockers   Reason not to administer Beta Blockers:Not Applicable 

## 2012-12-26 NOTE — Interval H&P Note (Signed)
History and Physical Interval Note:  12/26/2012 7:38 AM  [name]  has presented today for surgery, with the diagnosis of RIGHT SHOULDER ROTATOR CUFF TEAR OSTEO ARTHRITIS  AND SLAP TEAR   The various methods of treatment have been discussed with the patient and family. After consideration of risks, benefits and other options for treatment, the patient has consented to  Procedure(s): RIGHT SHOULDER ARTHROSCOPY WITH SUBACROMIAL DECOMPRESSION MINI OPEN ROTATOR CUFF REPAIR, OPEN BICEP TENODESIS OPEN DISTAL CLAVICLE RESECTION   (Right) as a surgical intervention .  The patient's history has been reviewed, patient examined, no change in status, stable for surgery.  I have reviewed the patient's chart and labs.  Questions were answered to the patient's satisfaction.     Isabele Lollar,STEVEN R

## 2012-12-26 NOTE — Brief Op Note (Signed)
12/26/2012  10:11 AM  PATIENT:  Dolphus Jenny  52 y.o. male  PRE-OPERATIVE DIAGNOSIS:  RIGHT SHOULDER ROTATOR CUFF TEAR, AC, OSTEO ARTHRITIS  AND SLAP TEAR   POST-OPERATIVE DIAGNOSIS:  RIGHT SHOULDER ROTATOR CUFF TEAR, AC OSTEO ARTHRITIS AND PAN LABRAL TEAR  PROCEDURE:  Procedure(s): RIGHT SHOULDER ARTHROSCOPY WITH SUBACROMIAL DECOMPRESSION MINI OPEN ROTATOR CUFF REPAIR, OPEN BICEP TENODESIS OPEN DISTAL CLAVICLE RESECTION   (Right)  SURGEON:  Surgeon(s) and Role:    * Verlee Rossetti, MD - Primary  PHYSICIAN ASSISTANT:   ASSISTANTS: Thea Gist, PA-C   ANESTHESIA:   regional and general  EBL:  Total I/O In: 1000 [I.V.:1000] Out: 25 [Blood:25]  BLOOD ADMINISTERED:none  DRAINS: none   LOCAL MEDICATIONS USED:  MARCAINE     SPECIMEN:  No Specimen  DISPOSITION OF SPECIMEN:  N/A  COUNTS:  YES  TOURNIQUET:  * No tourniquets in log *  DICTATION: .Other Dictation: Dictation Number [id number]  PLAN OF CARE: Discharge to home after PACU  PATIENT DISPOSITION:  PACU - hemodynamically stable.   Delay start of Pharmacological VTE agent (>24hrs) due to surgical blood loss or risk of bleeding: not applicable

## 2012-12-26 NOTE — Anesthesia Procedure Notes (Addendum)
Anesthesia Regional Block:  Interscalene brachial plexus block  Pre-Anesthetic Checklist: ,, timeout performed, Correct Patient, Correct Site, Correct Laterality, Correct Procedure, Correct Position, site marked, Risks and benefits discussed,  Surgical consent,  Pre-op evaluation,  At surgeon's request and post-op pain management  Laterality: Right  Prep: chloraprep       Needles:  Injection technique: Single-shot  Needle Type: Stimulator Needle - 40     Needle Length: 4cm  Needle Gauge: 22 and 22 G    Additional Needles:  Procedures: nerve stimulator Interscalene brachial plexus block  Nerve Stimulator or Paresthesia:  Response: forearm twitch, 0.45 mA, 0.1 ms,   Additional Responses:   Narrative:  Start time: 12/26/2012 7:21 AM End time: 12/26/2012 7:27 AM Injection made incrementally with aspirations every 5 mL.  Performed by: Personally  Anesthesiologist: Sandford Craze, MD  Additional Notes: Pt identified in Holding room.  Monitors applied. Working IV access confirmed. Sterile prep R neck.  #22ga PNS to forearm twitch at 0.59mA threshold.  30cc 0.5% Bupivacaine with 1:200k epi injected incrementally after negative test dose.  Patient asymptomatic, VSS, no heme aspirated, tolerated well.  Sandford Craze, ,MD  Interscalene brachial plexus block Procedure Name: Intubation Date/Time: 12/26/2012 7:49 AM Performed by: Sharlene Dory E Pre-anesthesia Checklist: Patient identified, Emergency Drugs available, Suction available, Patient being monitored and Timeout performed Patient Re-evaluated:Patient Re-evaluated prior to inductionOxygen Delivery Method: Circle system utilized Preoxygenation: Pre-oxygenation with 100% oxygen Intubation Type: IV induction Ventilation: Mask ventilation without difficulty and Oral airway inserted - appropriate to patient size Laryngoscope Size: Mac and 4 Grade View: Grade I Tube type: Oral Tube size: 8.0 mm Number of attempts: 1 Airway Equipment and  Method: Stylet Placement Confirmation: ETT inserted through vocal cords under direct vision,  positive ETCO2 and breath sounds checked- equal and bilateral Secured at: 22 cm Tube secured with: Tape Dental Injury: Teeth and Oropharynx as per pre-operative assessment

## 2012-12-26 NOTE — Anesthesia Preprocedure Evaluation (Addendum)
Anesthesia Evaluation  Patient identified by MRN, date of birth, ID band Patient awake    Reviewed: Allergy & Precautions, H&P , NPO status , Patient's Chart, lab work & pertinent test results  History of Anesthesia Complications Negative for: history of anesthetic complications  Airway Mallampati: I TM Distance: >3 FB Neck ROM: full    Dental  (+) Teeth Intact, Dental Advidsory Given and Chipped   Pulmonary former smoker (quit '00),  breath sounds clear to auscultation  Pulmonary exam normal       Cardiovascular hypertension, Pt. on medications Rhythm:Regular Rate:Normal  8/14 stress test: no ischemia, normal perfusion, EF 54%   Neuro/Psych negative neurological ROS     GI/Hepatic GERD-  Medicated and Controlled,(+)     substance abuse  alcohol use, H/o pancreatitis H/o GI bleed   Endo/Other  negative endocrine ROS  Renal/GU negative Renal ROS     Musculoskeletal   Abdominal (+) + obese,   Peds  Hematology   Anesthesia Other Findings   Reproductive/Obstetrics                        Anesthesia Physical Anesthesia Plan  ASA: II  Anesthesia Plan: General   Post-op Pain Management:    Induction: Intravenous  Airway Management Planned: Oral ETT  Additional Equipment:   Intra-op Plan:   Post-operative Plan: Extubation in OR  Informed Consent: I have reviewed the patients History and Physical, chart, labs and discussed the procedure including the risks, benefits and alternatives for the proposed anesthesia with the patient or authorized representative who has indicated his/her understanding and acceptance.   Dental Advisory Given and Dental advisory given  Plan Discussed with: Anesthesiologist, CRNA and Surgeon  Anesthesia Plan Comments: (Plan routine monitors, GETA with Interscalene block for post op analgesia)      Anesthesia Quick Evaluation

## 2012-12-26 NOTE — Transfer of Care (Signed)
Immediate Anesthesia Transfer of Care Note  Patient: [name]  Procedure(s) Performed: Procedure(s): RIGHT SHOULDER ARTHROSCOPY WITH SUBACROMIAL DECOMPRESSION MINI OPEN ROTATOR CUFF REPAIR, OPEN BICEP TENODESIS OPEN DISTAL CLAVICLE RESECTION   (Right)  Patient Location: PACU  Anesthesia Type:General  Level of Consciousness: awake, alert  and oriented  Airway & Oxygen Therapy: Patient Spontanous Breathing and Patient connected to nasal cannula oxygen  Post-op Assessment: Report given to PACU RN, Post -op Vital signs reviewed and stable and Patient moving all extremities X 4  Post vital signs: Reviewed and stable  Complications: No apparent anesthesia complications

## 2012-12-30 ENCOUNTER — Encounter (HOSPITAL_COMMUNITY): Payer: Self-pay | Admitting: Orthopedic Surgery

## 2013-01-08 ENCOUNTER — Telehealth: Payer: Self-pay | Admitting: Internal Medicine

## 2013-01-08 NOTE — Telephone Encounter (Signed)
Patient is out of meds that Dr. Devonne Doughty prescribed on his last visit.  Patient is requesting current meds to be called in to [facility] on [address]  Patient also states that he was given samples of a medicine to take and needs a rx for that medicine but patient is unsure of the med.  He is also unsure of what meds he should be taking right now.  Please give a call in regard.

## 2013-01-09 MED ORDER — FUROSEMIDE 40 MG PO TABS: 40.0000 mg | ORAL_TABLET | Freq: Every day | ORAL | Status: DC

## 2013-01-09 MED ORDER — OLMESARTAN MEDOXOMIL 40 MG PO TABS: 40.0000 mg | ORAL_TABLET | Freq: Every day | ORAL | Status: DC

## 2013-01-09 MED ORDER — AMLODIPINE BESYLATE 10 MG PO TABS: 10.0000 mg | ORAL_TABLET | Freq: Every day | ORAL | Status: DC

## 2013-01-09 NOTE — Telephone Encounter (Signed)
Prescription sent to [facility] pharmacy and patient is aware.

## 2013-01-09 NOTE — Telephone Encounter (Signed)
Amlodipine 10 mg q d, #30 , refill prn Benicar 40 mg q d, #30, refill prn Furosemide 40 mg qd, # 30, refill prn  Thnx

## 2013-01-09 NOTE — Telephone Encounter (Signed)
Will you verify what meds patient should be taking. Did he get samples of Benicare he now needs a prescription for? Please advise, thank you.

## 2013-01-23 ENCOUNTER — Telehealth: Payer: Self-pay | Admitting: *Deleted

## 2013-01-23 NOTE — Telephone Encounter (Signed)
Pt called states with insurance copay for Benicar is $64.  Please advise

## 2013-01-26 MED ORDER — LOSARTAN POTASSIUM 100 MG PO TABS: 100.0000 mg | ORAL_TABLET | Freq: Every day | ORAL | Status: DC

## 2013-01-26 NOTE — Telephone Encounter (Signed)
Pt advised Rx changed to [facility]

## 2013-01-26 NOTE — Telephone Encounter (Signed)
May substitute losartan 100 mg once a day. Call in to pharmacy #[id number], refill x 11.

## 2013-03-12 ENCOUNTER — Other Ambulatory Visit: Payer: Self-pay

## 2013-04-16 ENCOUNTER — Other Ambulatory Visit: Payer: Self-pay | Admitting: Internal Medicine

## 2013-05-20 ENCOUNTER — Ambulatory Visit: Payer: Self-pay | Admitting: Internal Medicine

## 2013-05-21 ENCOUNTER — Encounter: Payer: Self-pay | Admitting: Internal Medicine

## 2013-05-21 ENCOUNTER — Ambulatory Visit (INDEPENDENT_AMBULATORY_CARE_PROVIDER_SITE_OTHER): Payer: BC Managed Care – PPO | Admitting: Internal Medicine

## 2013-05-21 VITALS — BP 140/92 | HR 78 | Temp 98.5°F | Wt 242.4 lb

## 2013-05-21 DIAGNOSIS — R232 Flushing: Secondary | ICD-10-CM

## 2013-05-21 DIAGNOSIS — R000 Tachycardia, unspecified: Secondary | ICD-10-CM

## 2013-05-21 DIAGNOSIS — R231 Pallor: Secondary | ICD-10-CM

## 2013-05-21 NOTE — Patient Instructions (Signed)
Good to see you. I hope you continue to make a good recovery from the rotator cuff surgery  Your symptoms of paroxysmal sweats, rapid heart rate, flushing and heat, sweats and that "butterfly" feeling in the gut suggests over secretion of adrenal gland hormones. Plan 24 hr urine collection to measure adrenal hormones with recommendations to follow.

## 2013-05-21 NOTE — Progress Notes (Signed)
Pre visit review using our clinic review tool, if applicable. No additional management support is needed unless otherwise documented below in the visit note. 

## 2013-05-21 NOTE — Progress Notes (Signed)
Subjective:    Patient ID: [name], male    DOB: 12-15-1960, 53 y.o.   MRN: [id number]  HPI [name] presents for evaluation of elevated BP. He has been having sweats, fatigued, fidgety and restlessness. He does admit to the sense of fight-flight He also doesn't sleep well.   He did have right rotator cuff repair August '14. Still in rehab - now for work conditioning.  Past Medical History  Diagnosis Date  . Ureteral stent displacement 1990  . Pancreatitis     Hx of   . Hyperlipemia   . GERD (gastroesophageal reflux disease)   . Alcohol abuse   . Arthritis   . Diverticulosis of colon (without mention of hemorrhage)   . Nephrolithiasis   . Kidney stones   . Hypertension     dr Ronalee Belts  [facility]ins  . Anxiety    Past Surgical History  Procedure Laterality Date  . Ureteroscopy      and stone retreval  . A2 pulley flexor sheath cyst 4th finger excisional biopsy      '03  . Anterior cruciate ligament repair      R knee  . Knee surgery  2008  . Esophagogastroduodenoscopy N/A 07/09/2012    Procedure: ESOPHAGOGASTRODUODENOSCOPY (EGD);  Surgeon: Inda Castle, MD;  Location: [facility];  Service: Endoscopy;  Laterality: N/A;  . Shoulder arthroscopy with subacromial decompression and open rotator c Right 12/26/2012    Procedure: RIGHT SHOULDER ARTHROSCOPY WITH SUBACROMIAL DECOMPRESSION MINI OPEN ROTATOR CUFF REPAIR, OPEN BICEP TENODESIS OPEN DISTAL CLAVICLE RESECTION  ;  Surgeon: Augustin Schooling, MD;  Location: [facility];  Service: Orthopedics;  Laterality: Right;   Family History  Problem Relation Age of Onset  . Hypertension Father     X4 siblings  . Diabetes Mother     x2 brothers  . Heart disease Father    History   Social History  . Marital Status: Married    Spouse Name: N/A    Number of Children: 3  . Years of Education: N/A   Occupational History  .  [employer]    Social History Main Topics  . Smoking status: Former Smoker    Types:  Cigarettes    Quit date: 07/10/1998  . Smokeless tobacco: Never Used  . Alcohol Use: 0.0 oz/week     Comment: daily  . Drug Use: No  . Sexual Activity: Yes   Other Topics Concern  . Not on file   Social History Narrative   HS[employer]   [employer] groundskeeper   Difficult domestic situation, Married '87- separated '01   2 sons- '82, '91; 1 daughter '91   His niece is 68 with metastatic breast cancer (feb '12)    Current Outpatient Prescriptions on File Prior to Visit  Medication Sig Dispense Refill  . amLODipine (NORVASC) 10 MG tablet Take 1 tablet (10 mg total) by mouth daily.  30 tablet  5  . Aspirin-Acetaminophen-Caffeine (EXCEDRIN PO) Take 2 tablets by mouth as needed (headache.).      [name] furosemide (LASIX) 40 MG tablet Take 1 tablet (40 mg total) by mouth daily.  30 tablet  5  . losartan (COZAAR) 100 MG tablet Take 1 tablet (100 mg total) by mouth daily.  90 tablet  3  . methocarbamol (ROBAXIN) 500 MG tablet Take 1 tablet (500 mg total) by mouth 3 (three) times daily as needed.  60 tablet  1  . Multiple  Vitamins-Minerals (MULTIVITAMIN WITH MINERALS) tablet Take 1 tablet by mouth daily.      [name] omeprazole (PRILOSEC) 20 MG capsule Take 20 mg by mouth daily.      . Chlorpheniramine-DM (CORICIDIN HBP COUGH/COLD PO) Take 1 tablet by mouth as needed (for cold symptoms).      . Menthol, Topical Analgesic, (ICY HOT EX) Apply 1 application topically daily as needed (for shoulder pain.).      [name] metaxalone (SKELAXIN) 800 MG tablet Take 800 mg by mouth as needed for pain.      [name] oxyCODONE-acetaminophen (PERCOCET/ROXICET) 5-325 MG per tablet Take 1 tablet by mouth every 6 (six) hours as needed for pain.      [name] oxyCODONE-acetaminophen (ROXICET) 5-325 MG per tablet Take 1-2 tablets by mouth every 4 (four) hours as needed for pain.  60 tablet  0   No current facility-administered medications on file prior to visit.      Review of Systems System review is negative for any constitutional, cardiac,  pulmonary, GI or neuro symptoms or complaints other than as described in the HPI.     Objective:   Physical Exam Filed Vitals:   05/21/13 1122  BP: 140/92  Pulse: 78  Temp: 98.5 F (36.9 C)   BP Readings from Last 3 Encounters:  05/21/13 140/92  12/26/12 129/80  12/26/12 129/80   Gen'l - heavyset man in no distress HEENT- PERRLA Cor 2+ radial RRR, no bruits over flank/abdomen at level of umbilicus Pulm - normal Neuro - non-focal.       Assessment & Plan:  Sweats and flushing - constellation of symptoms that suggest excessive catecholamine secretion.  PLan 24 hr Urine for metanephrines and catecholamines.

## 2013-05-29 ENCOUNTER — Other Ambulatory Visit: Payer: BC Managed Care – PPO

## 2013-05-29 DIAGNOSIS — R231 Pallor: Secondary | ICD-10-CM

## 2013-05-29 DIAGNOSIS — R000 Tachycardia, unspecified: Secondary | ICD-10-CM

## 2013-05-29 DIAGNOSIS — R232 Flushing: Secondary | ICD-10-CM

## 2013-06-02 LAB — METANEPHRINES, URINE, 24 HOUR
Metaneph Total, Ur: 514 mcg/24 h (ref 224–832)
Metanephrines, Ur: 108 mcg/24 h (ref 90–315)
NORMETANEPHRINE 24H UR: 406 ug/(24.h) (ref 122–676)

## 2013-06-03 LAB — CATECHOLAMINES, FRACTIONATED, URINE, 24 HOUR
Calculated Total (E+NE): 32 mcg/24 h (ref 26–121)
Creatinine, Urine mg/day-CATEUR: 1.23 g/(24.h) (ref 0.63–2.50)
DOPAMINE, 24 HR URINE: 138 ug/(24.h) (ref 52–480)
Norepinephrine, 24 hr Ur: 32 mcg/24 h (ref 15–100)
Total Volume - CF 24Hr U: 1500 mL

## 2013-09-07 ENCOUNTER — Other Ambulatory Visit: Payer: Self-pay

## 2013-09-07 MED ORDER — FUROSEMIDE 40 MG PO TABS: 40.0000 mg | ORAL_TABLET | Freq: Every day | ORAL | Status: DC

## 2013-11-18 ENCOUNTER — Other Ambulatory Visit: Payer: Self-pay

## 2013-11-18 MED ORDER — OMEPRAZOLE 20 MG PO CPDR: 20.0000 mg | DELAYED_RELEASE_CAPSULE | Freq: Every day | ORAL | Status: DC

## 2013-12-02 ENCOUNTER — Ambulatory Visit (INDEPENDENT_AMBULATORY_CARE_PROVIDER_SITE_OTHER): Payer: BC Managed Care – PPO | Admitting: Internal Medicine

## 2013-12-02 DIAGNOSIS — IMO0002 Reserved for concepts with insufficient information to code with codable children: Secondary | ICD-10-CM

## 2013-12-02 DIAGNOSIS — R4689 Other symptoms and signs involving appearance and behavior: Secondary | ICD-10-CM | POA: Insufficient documentation

## 2013-12-02 MED ORDER — AMOXICILLIN 500 MG PO CAPS: 500.0000 mg | ORAL_CAPSULE | Freq: Three times a day (TID) | ORAL | Status: DC

## 2013-12-03 NOTE — Progress Notes (Signed)
   Subjective:    Patient ID: [name], male    DOB: Sep 02, 1960, 53 y.o.   MRN: [id number]  HPI    Review of Systems     Objective:   Physical Exam        Assessment & Plan:  No Show for Same Day Appt

## 2013-12-09 ENCOUNTER — Telehealth: Payer: Self-pay | Admitting: Internal Medicine

## 2013-12-09 NOTE — Telephone Encounter (Signed)
[facility] patient transferring to [facility] no showed for acute ov on 07/29.  Please advise.

## 2013-12-09 NOTE — Telephone Encounter (Signed)
Do not schedule him with me agian please

## 2013-12-10 NOTE — Telephone Encounter (Signed)
Will note

## 2013-12-22 ENCOUNTER — Telehealth: Payer: Self-pay | Admitting: *Deleted

## 2013-12-22 MED ORDER — OMEPRAZOLE 20 MG PO CPDR: 20.0000 mg | DELAYED_RELEASE_CAPSULE | Freq: Every day | ORAL | Status: DC

## 2013-12-22 MED ORDER — FUROSEMIDE 40 MG PO TABS: 40.0000 mg | ORAL_TABLET | Freq: Every day | ORAL | Status: DC

## 2013-12-22 MED ORDER — AMLODIPINE BESYLATE 10 MG PO TABS: 10.0000 mg | ORAL_TABLET | Freq: Every day | ORAL | Status: DC

## 2013-12-22 MED ORDER — LOSARTAN POTASSIUM 100 MG PO TABS: 100.0000 mg | ORAL_TABLET | Freq: Every day | ORAL | Status: DC

## 2013-12-22 NOTE — Telephone Encounter (Signed)
Pt stated have appt set to see Dr. Doug Sou in Oct, but he is needing refills on his losartan, amlodipine and omeprazole. Inform pt will send enough until he establish with new pcp....[name]

## 2014-03-02 ENCOUNTER — Ambulatory Visit (INDEPENDENT_AMBULATORY_CARE_PROVIDER_SITE_OTHER): Payer: BC Managed Care – PPO | Admitting: Internal Medicine

## 2014-03-02 ENCOUNTER — Other Ambulatory Visit (INDEPENDENT_AMBULATORY_CARE_PROVIDER_SITE_OTHER): Payer: BC Managed Care – PPO

## 2014-03-02 ENCOUNTER — Encounter: Payer: Self-pay | Admitting: Internal Medicine

## 2014-03-02 VITALS — BP 140/92 | HR 82 | Temp 98.7°F | Resp 20 | Ht 72.0 in | Wt 240.0 lb

## 2014-03-02 DIAGNOSIS — I10 Essential (primary) hypertension: Secondary | ICD-10-CM

## 2014-03-02 DIAGNOSIS — E669 Obesity, unspecified: Secondary | ICD-10-CM

## 2014-03-02 DIAGNOSIS — Z0000 Encounter for general adult medical examination without abnormal findings: Secondary | ICD-10-CM

## 2014-03-02 DIAGNOSIS — F1010 Alcohol abuse, uncomplicated: Secondary | ICD-10-CM

## 2014-03-02 LAB — BASIC METABOLIC PANEL
BUN: 12 mg/dL (ref 6–23)
CHLORIDE: 100 meq/L (ref 96–112)
CO2: 21 mEq/L (ref 19–32)
Calcium: 8.9 mg/dL (ref 8.4–10.5)
Creatinine, Ser: 1.1 mg/dL (ref 0.4–1.5)
GFR: 95.36 mL/min (ref >60.00)
GLUCOSE: 120 mg/dL — AB (ref 70–99)
POTASSIUM: 2.9 meq/L — AB (ref 3.5–5.1)
SODIUM: 137 meq/L (ref 135–145)

## 2014-03-02 LAB — HEMOGLOBIN A1C: Hgb A1c MFr Bld: 5.4 % (ref 4.6–6.5)

## 2014-03-02 NOTE — Assessment & Plan Note (Signed)
Patient declines flu shot. Mood issues right now and spoke with him about ssri's and he wants to think about it.

## 2014-03-02 NOTE — Progress Notes (Signed)
   Subjective:    Patient ID: [name], male    DOB: [birth year], 53 y.o.   MRN: [id number]  HPI The patient is a 53 YO man who is coming in to establish care. He has PMH of HTN, alchohol abuse, GERD. He is out of work right now due to back pain and this is causing him some low moods. He is also newly separated from his wife and this is an additional stressor. He is drinking more since that time and is living with his mom again. He denies chest pains, SOB, headaches, joint pain, abdominal pain.   Review of Systems  Constitutional: Positive for activity change. Negative for fever, appetite change, fatigue and unexpected weight change.  HENT: Negative.   Eyes: Negative.   Respiratory: Negative for cough, chest tightness, shortness of breath and wheezing.   Cardiovascular: Negative for chest pain, palpitations and leg swelling.  Gastrointestinal: Negative for abdominal pain, diarrhea, constipation and abdominal distention.  Musculoskeletal: Positive for back pain. Negative for arthralgias, gait problem and myalgias.  Skin: Negative.   Neurological: Negative.   Psychiatric/Behavioral: Positive for sleep disturbance and dysphoric mood. Negative for suicidal ideas and self-injury. The patient is not nervous/anxious.       Objective:   Physical Exam  Constitutional: He is oriented to person, place, and time. He appears well-developed and well-nourished.  Overweight  HENT:  Head: Normocephalic and atraumatic.  Eyes: EOM are normal.  Neck: Normal range of motion.  Cardiovascular: Normal rate and regular rhythm.   Pulmonary/Chest: Effort normal and breath sounds normal. No respiratory distress. He has no wheezes. He has no rales.  Abdominal: Soft. Bowel sounds are normal. He exhibits no distension. There is no tenderness. There is no rebound.  Neurological: He is alert and oriented to person, place, and time. Coordination normal.  Skin: Skin is warm and dry.   Filed Vitals:   03/02/14  1004  BP: 140/92  Pulse: 82  Temp: 98.7 F (37.1 C)  TempSrc: Oral  Resp: 20  Height: 6' (1.829 m)  Weight: 240 lb (108.863 kg)  SpO2: 95%      Assessment & Plan:  Patient declines flu shot.

## 2014-03-02 NOTE — Assessment & Plan Note (Signed)
BP well controlled and check BMP today. On amlodipine, losartan, lasix.

## 2014-03-02 NOTE — Assessment & Plan Note (Signed)
Patient does not have legal troubles from drinking but is currently separated from wife (she did not like his drinking and how he acted with drinking), eye openers (as recently as this weekend), drinking more than he wants, behaviors he regrets with drinking. He wants to cut back and encouraged that and reminded him that alcohol is a depressant and can make his mood problems worse.

## 2014-03-02 NOTE — Progress Notes (Signed)
Pre visit review using our clinic review tool, if applicable. No additional management support is needed unless otherwise documented below in the visit note. 

## 2014-03-02 NOTE — Patient Instructions (Signed)
We would like you to think about whether a medicine to help make your low moods not so low and have less of those low moods might be helpful for you. We'll give you some information about them and if you want to try one just call our office and we can send it in.   Work on exercising that shoulder to help keep it from getting stiff and sore. Alcohol is a medicine that makes you have lower moods (it is a depressant) and decreasing the amount of alcohol you are using can help to improve your mood. Exercise is another good way to help make the moods get better as exercise releases hormones that affect happiness in the brain, and is a good way to reduce stress in your life.   Come back in about 6 months to check on the blood pressure, we will check some blood work today and call you with the results.    Shoulder Exercises EXERCISES  RANGE OF MOTION (ROM) AND STRETCHING EXERCISES These exercises may help you when beginning to rehabilitate your injury. Your symptoms may resolve with or without further involvement from your physician, physical therapist or athletic trainer. While completing these exercises, remember:   Restoring tissue flexibility helps normal motion to return to the joints. This allows healthier, less painful movement and activity.  An effective stretch should be held for at least 30 seconds.  A stretch should never be painful. You should only feel a gentle lengthening or release in the stretched tissue. ROM - Pendulum  Bend at the waist so that your right / left arm falls away from your body. Support yourself with your opposite hand on a solid surface, such as a table or a countertop.  Your right / left arm should be perpendicular to the ground. If it is not perpendicular, you need to lean over farther. Relax the muscles in your right / left arm and shoulder as much as possible.  Gently sway your hips and trunk so they move your right / left arm without any use of your right / left  shoulder muscles.  Progress your movements so that your right / left arm moves side to side, then forward and backward, and finally, both clockwise and counterclockwise.  Complete __________ repetitions in each direction. Many people use this exercise to relieve discomfort in their shoulder as well as to gain range of motion. Repeat __________ times. Complete this exercise __________ times per day. STRETCH - Flexion, Standing  Stand with good posture. With an underhand grip on your right / left hand and an overhand grip on the opposite hand, grasp a broomstick or cane so that your hands are a little more than shoulder-width apart.  Keeping your right / left elbow straight and shoulder muscles relaxed, push the stick with your opposite hand to raise your right / left arm in front of your body and then overhead. Raise your arm until you feel a stretch in your right / left shoulder, but before you have increased shoulder pain.  Try to avoid shrugging your right / left shoulder as your arm rises by keeping your shoulder blade tucked down and toward your mid-back spine. Hold __________ seconds.  Slowly return to the starting position. Repeat __________ times. Complete this exercise __________ times per day. STRETCH - Internal Rotation  Place your right / left hand behind your back, palm-up.  Throw a towel or belt over your opposite shoulder. Grasp the towel/belt with your right / left hand.  While keeping an upright posture, gently pull up on the towel/belt until you feel a stretch in the front of your right / left shoulder.  Avoid shrugging your right / left shoulder as your arm rises by keeping your shoulder blade tucked down and toward your mid-back spine.  Hold __________. Release the stretch by lowering your opposite hand. Repeat __________ times. Complete this exercise __________ times per day. STRETCH - External Rotation and Abduction  Stagger your stance through a doorframe. It does  not matter which foot is forward.  As instructed by your physician, physical therapist or athletic trainer, place your hands:  And forearms above your head and on the door frame.  And forearms at head-height and on the door frame.  At elbow-height and on the door frame.  Keeping your head and chest upright and your stomach muscles tight to prevent over-extending your low-back, slowly shift your weight onto your front foot until you feel a stretch across your chest and/or in the front of your shoulders.  Hold __________ seconds. Shift your weight to your back foot to release the stretch. Repeat __________ times. Complete this stretch __________ times per day.  STRENGTHENING EXERCISES  These exercises may help you when beginning to rehabilitate your injury. They may resolve your symptoms with or without further involvement from your physician, physical therapist or athletic trainer. While completing these exercises, remember:   Muscles can gain both the endurance and the strength needed for everyday activities through controlled exercises.  Complete these exercises as instructed by your physician, physical therapist or athletic trainer. Progress the resistance and repetitions only as guided.  You may experience muscle soreness or fatigue, but the pain or discomfort you are trying to eliminate should never worsen during these exercises. If this pain does worsen, stop and make certain you are following the directions exactly. If the pain is still present after adjustments, discontinue the exercise until you can discuss the trouble with your clinician.  If advised by your physician, during your recovery, avoid activity or exercises which involve actions that place your right / left hand or elbow above your head or behind your back or head. These positions stress the tissues which are trying to heal. STRENGTH - Scapular Depression and Adduction  With good posture, sit on a firm chair. Supported  your arms in front of you with pillows, arm rests or a table top. Have your elbows in line with the sides of your body.  Gently draw your shoulder blades down and toward your mid-back spine. Gradually increase the tension without tensing the muscles along the top of your shoulders and the back of your neck.  Hold for __________ seconds. Slowly release the tension and relax your muscles completely before completing the next repetition.  After you have practiced this exercise, remove the arm support and complete it in standing as well as sitting. Repeat __________ times. Complete this exercise __________ times per day.  STRENGTH - External Rotators  Secure a rubber exercise band/tubing to a fixed object so that it is at the same height as your right / left elbow when you are standing or sitting on a firm surface.  Stand or sit so that the secured exercise band/tubing is at your side that is not injured.  Bend your elbow 90 degrees. Place a folded towel or small pillow under your right / left arm so that your elbow is a few inches away from your side.  Keeping the tension on the exercise band/tubing,  pull it away from your body, as if pivoting on your elbow. Be sure to keep your body steady so that the movement is only coming from your shoulder rotating.  Hold __________ seconds. Release the tension in a controlled manner as you return to the starting position. Repeat __________ times. Complete this exercise __________ times per day.  STRENGTH - Supraspinatus  Stand or sit with good posture. Grasp a __________ weight or an exercise band/tubing so that your hand is "thumbs-up," like when you shake hands.  Slowly lift your right / left hand from your thigh into the air, traveling about 30 degrees from straight out at your side. Lift your hand to shoulder height or as far as you can without increasing any shoulder pain. Initially, many people do not lift their hands above shoulder height.  Avoid  shrugging your right / left shoulder as your arm rises by keeping your shoulder blade tucked down and toward your mid-back spine.  Hold for __________ seconds. Control the descent of your hand as you slowly return to your starting position. Repeat __________ times. Complete this exercise __________ times per day.  STRENGTH - Shoulder Extensors  Secure a rubber exercise band/tubing so that it is at the height of your shoulders when you are either standing or sitting on a firm arm-less chair.  With a thumbs-up grip, grasp an end of the band/tubing in each hand. Straighten your elbows and lift your hands straight in front of you at shoulder height. Step back away from the secured end of band/tubing until it becomes tense.  Squeezing your shoulder blades together, pull your hands down to the sides of your thighs. Do not allow your hands to go behind you.  Hold for __________ seconds. Slowly ease the tension on the band/tubing as you reverse the directions and return to the starting position. Repeat __________ times. Complete this exercise __________ times per day.  STRENGTH - Scapular Retractors  Secure a rubber exercise band/tubing so that it is at the height of your shoulders when you are either standing or sitting on a firm arm-less chair.  With a palm-down grip, grasp an end of the band/tubing in each hand. Straighten your elbows and lift your hands straight in front of you at shoulder height. Step back away from the secured end of band/tubing until it becomes tense.  Squeezing your shoulder blades together, draw your elbows back as you bend them. Keep your upper arm lifted away from your body throughout the exercise.  Hold __________ seconds. Slowly ease the tension on the band/tubing as you reverse the directions and return to the starting position. Repeat __________ times. Complete this exercise __________ times per day. STRENGTH - Scapular Depressors  Find a sturdy chair without wheels,  such as a from a dining room table.  Keeping your feet on the floor, lift your bottom from the seat and lock your elbows.  Keeping your elbows straight, allow gravity to pull your body weight down. Your shoulders will rise toward your ears.  Raise your body against gravity by drawing your shoulder blades down your back, shortening the distance between your shoulders and ears. Although your feet should always maintain contact with the floor, your feet should progressively support less body weight as you get stronger.  Hold __________ seconds. In a controlled and slow manner, lower your body weight to begin the next repetition. Repeat __________ times. Complete this exercise __________ times per day.  Document Released: 03/07/2005 Document Revised: 07/16/2011 Document Reviewed: 08/05/2008  [employer] Patient Information 2015 [employer]. This information is not intended to replace advice given to you by your health care provider. Make sure you discuss any questions you have with your health care provider.  

## 2014-03-03 NOTE — Addendum Note (Signed)
Addended by: Vertell Novak A on: 03/03/2014 09:22 AM   Modules accepted: Orders

## 2014-03-11 ENCOUNTER — Other Ambulatory Visit (INDEPENDENT_AMBULATORY_CARE_PROVIDER_SITE_OTHER): Payer: BC Managed Care – PPO

## 2014-03-11 DIAGNOSIS — I10 Essential (primary) hypertension: Secondary | ICD-10-CM

## 2014-03-11 LAB — BASIC METABOLIC PANEL
BUN: 11 mg/dL (ref 6–23)
CO2: 28 meq/L (ref 19–32)
CREATININE: 1 mg/dL (ref 0.4–1.5)
Calcium: 8.9 mg/dL (ref 8.4–10.5)
Chloride: 106 mEq/L (ref 96–112)
GFR: 100.87 mL/min (ref >60.00)
Glucose, Bld: 150 mg/dL — ABNORMAL HIGH (ref 70–99)
Potassium: 3.3 mEq/L — ABNORMAL LOW (ref 3.5–5.1)
Sodium: 140 mEq/L (ref 135–145)

## 2014-03-22 ENCOUNTER — Encounter (HOSPITAL_COMMUNITY): Payer: Self-pay | Admitting: *Deleted

## 2014-03-22 ENCOUNTER — Emergency Department (HOSPITAL_COMMUNITY)
Admission: EM | Admit: 2014-03-22 | Discharge: 2014-03-22 | Disposition: A | Discharge status: Home / Self Care | Payer: BC Managed Care – PPO | Attending: Emergency Medicine | Admitting: Emergency Medicine

## 2014-03-22 DIAGNOSIS — K088 Other specified disorders of teeth and supporting structures: Secondary | ICD-10-CM | POA: Insufficient documentation

## 2014-03-22 DIAGNOSIS — Z87442 Personal history of urinary calculi: Secondary | ICD-10-CM | POA: Diagnosis not present

## 2014-03-22 DIAGNOSIS — K029 Dental caries, unspecified: Secondary | ICD-10-CM | POA: Diagnosis not present

## 2014-03-22 DIAGNOSIS — Z87891 Personal history of nicotine dependence: Secondary | ICD-10-CM | POA: Diagnosis not present

## 2014-03-22 DIAGNOSIS — K219 Gastro-esophageal reflux disease without esophagitis: Secondary | ICD-10-CM | POA: Insufficient documentation

## 2014-03-22 DIAGNOSIS — Z8659 Personal history of other mental and behavioral disorders: Secondary | ICD-10-CM | POA: Insufficient documentation

## 2014-03-22 DIAGNOSIS — Z8739 Personal history of other diseases of the musculoskeletal system and connective tissue: Secondary | ICD-10-CM | POA: Diagnosis not present

## 2014-03-22 DIAGNOSIS — K0889 Other specified disorders of teeth and supporting structures: Secondary | ICD-10-CM

## 2014-03-22 DIAGNOSIS — I10 Essential (primary) hypertension: Secondary | ICD-10-CM | POA: Diagnosis not present

## 2014-03-22 DIAGNOSIS — Z79899 Other long term (current) drug therapy: Secondary | ICD-10-CM | POA: Insufficient documentation

## 2014-03-22 DIAGNOSIS — Z8639 Personal history of other endocrine, nutritional and metabolic disease: Secondary | ICD-10-CM | POA: Insufficient documentation

## 2014-03-22 MED ORDER — TRAMADOL HCL 50 MG PO TABS: 50.0000 mg | ORAL_TABLET | Freq: Four times a day (QID) | ORAL | Status: DC | PRN

## 2014-03-22 MED ORDER — KETOROLAC TROMETHAMINE 60 MG/2ML IM SOLN
60.0000 mg | Freq: Once | INTRAMUSCULAR | Status: AC
  Administered 2014-03-22: 60 mg via INTRAMUSCULAR
  Filled 2014-03-22: qty 2

## 2014-03-22 MED ORDER — PENICILLIN V POTASSIUM 500 MG PO TABS: 500.0000 mg | ORAL_TABLET | Freq: Four times a day (QID) | ORAL | Status: DC

## 2014-03-22 NOTE — ED Provider Notes (Signed)
CSN: [id number]     Arrival date & time 03/22/14  1949 History   First MD Initiated Contact with Patient 03/22/14 2007     Chief Complaint  Patient presents with  . Dental Pain     (Consider location/radiation/quality/duration/timing/severity/associated sxs/prior Treatment) HPI Comments: The patient is a 53 year old otherwise healthy male who presents with dental pain that started gradually yesterday. The dental pain is severe, constant and progressively worsening. The pain is aching and located in right upper jaw. The pain does not radiate. Eating makes the pain worse. Nothing makes the pain better. The patient has tried aleve for pain without relief. No associated symptoms. Patient denies headache, neck pain/stiffness, fever, NVD, edema, sore throat, throat swelling, wheezing, SOB, chest pain, abdominal pain.      Past Medical History  Diagnosis Date  . Ureteral stent displacement 1990  . Pancreatitis     Hx of   . Hyperlipemia   . GERD (gastroesophageal reflux disease)   . Alcohol abuse   . Arthritis   . Diverticulosis of colon (without mention of hemorrhage)   . Nephrolithiasis   . Kidney stones   . Hypertension     dr Ronalee Belts  norins  . Anxiety    Past Surgical History  Procedure Laterality Date  . Ureteroscopy      and stone retreval  . A2 pulley flexor sheath cyst 4th finger excisional biopsy      '03  . Anterior cruciate ligament repair      R knee  . Knee surgery  2008  . Esophagogastroduodenoscopy N/A 07/09/2012    Procedure: ESOPHAGOGASTRODUODENOSCOPY (EGD);  Surgeon: Inda Castle, MD;  Location: [facility];  Service: Endoscopy;  Laterality: N/A;  . Shoulder arthroscopy with subacromial decompression and open rotator c Right 12/26/2012    Procedure: RIGHT SHOULDER ARTHROSCOPY WITH SUBACROMIAL DECOMPRESSION MINI OPEN ROTATOR CUFF REPAIR, OPEN BICEP TENODESIS OPEN DISTAL CLAVICLE RESECTION  ;  Surgeon: Augustin Schooling, MD;  Location: [place];  Service: Orthopedics;   Laterality: Right;   Family History  Problem Relation Age of Onset  . Hypertension Father     X4 siblings  . Diabetes Mother     x2 brothers  . Heart disease Father    History  Substance Use Topics  . Smoking status: Former Smoker    Types: Cigarettes    Quit date: 07/10/1998  . Smokeless tobacco: Never Used  . Alcohol Use: 0.0 oz/week     Comment: daily    Review of Systems  Constitutional: Negative for fever, chills and fatigue.  HENT: Positive for dental problem. Negative for trouble swallowing.   Eyes: Negative for visual disturbance.  Respiratory: Negative for shortness of breath.   Cardiovascular: Negative for chest pain and palpitations.  Gastrointestinal: Negative for nausea, vomiting, abdominal pain and diarrhea.  Genitourinary: Negative for dysuria and difficulty urinating.  Musculoskeletal: Negative for arthralgias and neck pain.  Skin: Negative for color change.  Neurological: Negative for dizziness and weakness.  Psychiatric/Behavioral: Negative for dysphoric mood.      Allergies  Shrimp and Vicodin  Home Medications   Prior to Admission medications   Medication Sig Start Date End Date Taking? Authorizing Provider  amLODipine (NORVASC) 10 MG tablet Take 1 tablet (10 mg total) by mouth daily. 12/22/13   Janith Lima, MD  furosemide (LASIX) 40 MG tablet Take 1 tablet (40 mg total) by mouth daily. 12/22/13   Janith Lima, MD  losartan (COZAAR) 100 MG tablet Take  1 tablet (100 mg total) by mouth daily. 12/22/13   Janith Lima, MD  methocarbamol (ROBAXIN) 500 MG tablet Take 1 tablet (500 mg total) by mouth 3 (three) times daily as needed. 12/26/12   Augustin Schooling, MD  Multiple Vitamins-Minerals (MULTIVITAMIN WITH MINERALS) tablet Take 1 tablet by mouth daily.    Historical Provider, MD  omeprazole (PRILOSEC) 20 MG capsule Take 1 capsule (20 mg total) by mouth daily. 12/22/13   Janith Lima, MD   BP 164/83 mmHg  Pulse 94  Temp(Src) 98.5 F (36.9 C)  (Oral)  Resp 20  Ht 6' (1.829 m)  Wt 237 lb (107.502 kg)  BMI 32.14 kg/m2  SpO2 100% Physical Exam  Constitutional: He appears well-developed and well-nourished. No distress.  HENT:  Head: Normocephalic and atraumatic.  Mouth/Throat: Oropharynx is clear and moist. No oropharyngeal exudate.  Poor dentition. Multiple decayed teeth with fillings. Right upper molar tenderness to percussion. No abscess noted.   Eyes: Conjunctivae and EOM are normal.  Neck: Normal range of motion.  Cardiovascular: Normal rate and regular rhythm.  Exam reveals no gallop and no friction rub.   No murmur heard. Pulmonary/Chest: Effort normal and breath sounds normal. He has no wheezes. He has no rales. He exhibits no tenderness.  Abdominal: Soft. There is no tenderness.  Musculoskeletal: Normal range of motion.  Neurological: He is alert. Coordination normal.  Speech is goal-oriented. Moves limbs without ataxia.   Skin: Skin is warm and dry.  Psychiatric: He has a normal mood and affect. His behavior is normal.  Nursing note and vitals reviewed.   ED Course  Procedures (including critical care time) Labs Review Labs Reviewed - No data to display  Imaging Review No results found.   EKG Interpretation None      MDM   Final diagnoses:  Pain, dental    9:13 PM Patient will have toradol here. Vitals stable and patient afebrile. No signs of Lud[place]igs angina or abscess noted. Patient will have Veetid and Tramadol for pain. Patient will have dental referral. Patient instructed to return with worsening or concerning symptoms.    Alvina Chou, PA-C 03/22/14 [place], MD 03/22/14 [phone]

## 2014-03-22 NOTE — ED Notes (Signed)
Pt in c/o toothache and swelling to right upper jaw, symptoms started yesterday

## 2014-03-22 NOTE — Discharge Instructions (Signed)
Take Tramadol as needed for pain. Take Veetid as directed until gone. Refer to attached documents for more information. Follow up with Dr. Geralynn Ochs for further evaluation.

## 2014-03-23 ENCOUNTER — Other Ambulatory Visit: Payer: Self-pay | Admitting: Internal Medicine

## 2014-03-26 ENCOUNTER — Other Ambulatory Visit: Payer: Self-pay | Admitting: Internal Medicine

## 2014-04-27 ENCOUNTER — Other Ambulatory Visit: Payer: Self-pay | Admitting: Geriatric Medicine

## 2014-04-27 ENCOUNTER — Telehealth: Payer: Self-pay | Admitting: Internal Medicine

## 2014-04-27 MED ORDER — AMLODIPINE BESYLATE 10 MG PO TABS: 10.0000 mg | ORAL_TABLET | Freq: Every day | ORAL | Status: DC

## 2014-04-27 MED ORDER — OMEPRAZOLE 20 MG PO CPDR: 20.0000 mg | DELAYED_RELEASE_CAPSULE | Freq: Every day | ORAL | Status: DC

## 2014-04-27 MED ORDER — LOSARTAN POTASSIUM 100 MG PO TABS: 100.0000 mg | ORAL_TABLET | Freq: Every day | ORAL | Status: DC

## 2014-04-27 NOTE — Telephone Encounter (Signed)
Pt called in and said that he is need refill on all of the following meds. He is completely out.    Amlodipine  Losartan  Omeprazole

## 2014-04-27 NOTE — Telephone Encounter (Signed)
Sent to pharmacy 

## 2014-04-30 ENCOUNTER — Emergency Department (HOSPITAL_COMMUNITY): Payer: BC Managed Care – PPO

## 2014-04-30 ENCOUNTER — Emergency Department (HOSPITAL_COMMUNITY)
Admission: EM | Admit: 2014-04-30 | Discharge: 2014-04-30 | Disposition: A | Discharge status: Home / Self Care | Payer: BC Managed Care – PPO | Attending: Emergency Medicine | Admitting: Emergency Medicine

## 2014-04-30 ENCOUNTER — Encounter (HOSPITAL_COMMUNITY): Payer: Self-pay | Admitting: *Deleted

## 2014-04-30 DIAGNOSIS — F419 Anxiety disorder, unspecified: Secondary | ICD-10-CM | POA: Diagnosis not present

## 2014-04-30 DIAGNOSIS — R0602 Shortness of breath: Secondary | ICD-10-CM

## 2014-04-30 DIAGNOSIS — Z792 Long term (current) use of antibiotics: Secondary | ICD-10-CM | POA: Insufficient documentation

## 2014-04-30 DIAGNOSIS — Z87442 Personal history of urinary calculi: Secondary | ICD-10-CM | POA: Insufficient documentation

## 2014-04-30 DIAGNOSIS — M1990 Unspecified osteoarthritis, unspecified site: Secondary | ICD-10-CM | POA: Insufficient documentation

## 2014-04-30 DIAGNOSIS — K219 Gastro-esophageal reflux disease without esophagitis: Secondary | ICD-10-CM | POA: Diagnosis not present

## 2014-04-30 DIAGNOSIS — Z87891 Personal history of nicotine dependence: Secondary | ICD-10-CM | POA: Insufficient documentation

## 2014-04-30 DIAGNOSIS — I10 Essential (primary) hypertension: Secondary | ICD-10-CM | POA: Diagnosis not present

## 2014-04-30 DIAGNOSIS — J189 Pneumonia, unspecified organism: Secondary | ICD-10-CM

## 2014-04-30 DIAGNOSIS — Z79899 Other long term (current) drug therapy: Secondary | ICD-10-CM | POA: Diagnosis not present

## 2014-04-30 DIAGNOSIS — J159 Unspecified bacterial pneumonia: Secondary | ICD-10-CM | POA: Diagnosis not present

## 2014-04-30 DIAGNOSIS — Z8639 Personal history of other endocrine, nutritional and metabolic disease: Secondary | ICD-10-CM | POA: Insufficient documentation

## 2014-04-30 DIAGNOSIS — R52 Pain, unspecified: Secondary | ICD-10-CM | POA: Diagnosis present

## 2014-04-30 LAB — CBC
HCT: 35.7 % — ABNORMAL LOW (ref 39.0–52.0)
Hemoglobin: 12 g/dL — ABNORMAL LOW (ref 13.0–17.0)
MCH: 30.3 pg (ref 26.0–34.0)
MCHC: 33.6 g/dL (ref 30.0–36.0)
MCV: 90.2 fL (ref 78.0–100.0)
Platelets: 172 10*3/uL (ref 150–400)
RBC: 3.96 MIL/uL — AB (ref 4.22–5.81)
RDW: 12.8 % (ref 11.5–15.5)
WBC: 8.1 10*3/uL (ref 4.0–10.5)

## 2014-04-30 LAB — BASIC METABOLIC PANEL
Anion gap: 10 (ref 5–15)
BUN: 9 mg/dL (ref 6–23)
CHLORIDE: 105 meq/L (ref 96–112)
CO2: 25 mmol/L (ref 19–32)
CREATININE: 1.16 mg/dL (ref 0.50–1.35)
Calcium: 8.9 mg/dL (ref 8.4–10.5)
GFR calc non Af Amer: 71 mL/min — ABNORMAL LOW (ref >90)
GFR, EST AFRICAN AMERICAN: 82 mL/min — AB (ref >90)
Glucose, Bld: 109 mg/dL — ABNORMAL HIGH (ref 70–99)
POTASSIUM: 3.2 mmol/L — AB (ref 3.5–5.1)
Sodium: 140 mmol/L (ref 135–145)

## 2014-04-30 LAB — BRAIN NATRIURETIC PEPTIDE: B Natriuretic Peptide: 11.6 pg/mL (ref 0.0–100.0)

## 2014-04-30 LAB — I-STAT TROPONIN, ED: TROPONIN I, POC: 0 ng/mL (ref 0.00–0.08)

## 2014-04-30 MED ORDER — KETOROLAC TROMETHAMINE 30 MG/ML IJ SOLN
30.0000 mg | Freq: Once | INTRAMUSCULAR | Status: AC
  Administered 2014-04-30: 30 mg via INTRAVENOUS
  Filled 2014-04-30: qty 1

## 2014-04-30 MED ORDER — SODIUM CHLORIDE 0.9 % IV BOLUS (SEPSIS)
1000.0000 mL | Freq: Once | INTRAVENOUS | Status: AC
  Administered 2014-04-30: 1000 mL via INTRAVENOUS

## 2014-04-30 MED ORDER — AZITHROMYCIN 250 MG PO TABS
ORAL_TABLET | ORAL | Status: DC
Start: 2014-04-30 — End: 2014-05-27

## 2014-04-30 MED ORDER — IBUPROFEN 600 MG PO TABS: 600.0000 mg | ORAL_TABLET | Freq: Four times a day (QID) | ORAL | Status: DC | PRN

## 2014-04-30 MED ORDER — CEFTRIAXONE SODIUM 1 G IJ SOLR
1.0000 g | Freq: Once | INTRAMUSCULAR | Status: AC
  Administered 2014-04-30: 1 g via INTRAVENOUS
  Filled 2014-04-30: qty 10

## 2014-04-30 MED ORDER — POTASSIUM CHLORIDE CRYS ER 20 MEQ PO TBCR
40.0000 meq | EXTENDED_RELEASE_TABLET | Freq: Once | ORAL | Status: AC
  Administered 2014-04-30: 40 meq via ORAL
  Filled 2014-04-30: qty 2

## 2014-04-30 MED ORDER — BENZONATATE 100 MG PO CAPS: 100.0000 mg | ORAL_CAPSULE | Freq: Three times a day (TID) | ORAL | Status: DC

## 2014-04-30 NOTE — ED Notes (Signed)
Patient reports he has been sick for 2-3 weeks.  He has had cough, runny nose.  occassional sweats.  Patient denies any known fevers.  He also reports body aches,  Patient states no one else is sick at home.  Patient has tried otc meds w/o relief.  Patient states he has not been able to sleep x 3 nights due to cough.  Patient lives at home with mom

## 2014-04-30 NOTE — ED Provider Notes (Signed)
CSN: [id number]     Arrival date & time 04/30/14  0147 History   This chart was scribe for Martin Rice, MD by Judithann Sauger, ED Scribe. The patient was seen in room A10C/A10C and the patient's care was started at 2:49 AM.    Chief Complaint  Patient presents with  . URI  . Generalized Body Aches   Patient is a 53 y.o. male presenting with URI. The history is provided by the patient. No language interpreter was used.  URI Presenting symptoms: congestion, cough, fever and rhinorrhea   Presenting symptoms: no sore throat   Cough:    Cough characteristics:  Productive   Sputum characteristics:  Yellow   Duration:  2 weeks Fever:    Duration:  2 weeks   Timing:  Intermittent Rhinorrhea:    Quality:  Clear   Duration:  2 weeks   Timing:  Intermittent Onset quality:  Gradual Duration:  2 weeks Timing:  Constant Progression:  Worsening Chronicity:  New Relieved by:  Nothing Ineffective treatments:  OTC medications Associated symptoms: no headaches, no neck pain and no wheezing    HPI Comments: [name] is a 53 y.o. male who presents to the Emergency Department complaining of a gradually worsening URI and generalized body aches onset 2 weeks ago. He reports associated a productive cough of yellow phelgm, rhinorrhea, intermittent diaphoresis, intermittent CP and generalized body aches due to the cough. He reports fever and chills onset this week. He denies sore throat. He reports taking OTC medication with no relief. He denies any sick contacts.   PCP: Dr. Doug Sou  Past Medical History  Diagnosis Date  . Ureteral stent displacement 1990  . Pancreatitis     Hx of   . Hyperlipemia   . GERD (gastroesophageal reflux disease)   . Alcohol abuse   . Arthritis   . Diverticulosis of colon (without mention of hemorrhage)   . Nephrolithiasis   . Kidney stones   . Hypertension     dr Ronalee Belts  norins  . Anxiety    Past Surgical History  Procedure Laterality Date  .  Ureteroscopy      and stone retreval  . A2 pulley flexor sheath cyst 4th finger excisional biopsy      '03  . Anterior cruciate ligament repair      R knee  . Knee surgery  2008  . Esophagogastroduodenoscopy N/A 07/09/2012    Procedure: ESOPHAGOGASTRODUODENOSCOPY (EGD);  Surgeon: Inda Castle, MD;  Location: [facility];  Service: Endoscopy;  Laterality: N/A;  . Shoulder arthroscopy with subacromial decompression and open rotator c Right 12/26/2012    Procedure: RIGHT SHOULDER ARTHROSCOPY WITH SUBACROMIAL DECOMPRESSION MINI OPEN ROTATOR CUFF REPAIR, OPEN BICEP TENODESIS OPEN DISTAL CLAVICLE RESECTION  ;  Surgeon: Augustin Schooling, MD;  Location: [place];  Service: Orthopedics;  Laterality: Right;   Family History  Problem Relation Age of Onset  . Hypertension Father     X4 siblings  . Diabetes Mother     x2 brothers  . Heart disease Father    History  Substance Use Topics  . Smoking status: Former Smoker    Types: Cigarettes    Quit date: 07/10/1998  . Smokeless tobacco: Never Used  . Alcohol Use: 0.0 oz/week     Comment: daily    Review of Systems  Constitutional: Positive for fever, chills and diaphoresis.  HENT: Positive for congestion and rhinorrhea. Negative for sore throat.   Respiratory: Positive for cough and shortness  of breath. Negative for wheezing.   Cardiovascular: Positive for chest pain.  Gastrointestinal: Negative for nausea, vomiting, abdominal pain, diarrhea and constipation.  Musculoskeletal: Negative for back pain, neck pain and neck stiffness.  Skin: Negative for rash and wound.  Neurological: Negative for dizziness, weakness, light-headedness and headaches.  All other systems reviewed and are negative.     Allergies  Shrimp and Vicodin  Home Medications   Prior to Admission medications   Medication Sig Start Date End Date Taking? Authorizing Provider  amLODipine (NORVASC) 10 MG tablet Take 1 tablet (10 mg total) by mouth daily. 04/27/14  Yes  Olga Millers, MD  furosemide (LASIX) 40 MG tablet Take 1 tablet (40 mg total) by mouth daily. 12/22/13  Yes Janith Lima, MD  losartan (COZAAR) 100 MG tablet Take 1 tablet (100 mg total) by mouth daily. 04/27/14  Yes Olga Millers, MD  methocarbamol (ROBAXIN) 500 MG tablet Take 1 tablet (500 mg total) by mouth 3 (three) times daily as needed. 12/26/12  Yes Augustin Schooling, MD  Multiple Vitamins-Minerals (MULTIVITAMIN WITH MINERALS) tablet Take 1 tablet by mouth daily.   Yes Historical Provider, MD  omeprazole (PRILOSEC) 20 MG capsule Take 1 capsule (20 mg total) by mouth daily. 04/27/14  Yes Olga Millers, MD  azithromycin (ZITHROMAX Z-PAK) 250 MG tablet 2 po day one, then 1 daily x 4 days 04/30/14   Martin Rice, MD  benzonatate (TESSALON) 100 MG capsule Take 1 capsule (100 mg total) by mouth every 8 (eight) hours. 04/30/14   Martin Rice, MD  ibuprofen (ADVIL,MOTRIN) 600 MG tablet Take 1 tablet (600 mg total) by mouth every 6 (six) hours as needed. 04/30/14   Martin Rice, MD  penicillin v potassium (VEETID) 500 MG tablet Take 1 tablet (500 mg total) by mouth 4 (four) times daily. 03/22/14   Kaitlyn Szekalski, PA-C  traMADol (ULTRAM) 50 MG tablet Take 1 tablet (50 mg total) by mouth every 6 (six) hours as needed. 03/22/14   Kaitlyn Szekalski, PA-C   BP 154/100 mmHg  Pulse 76  Temp(Src) 98.5 F (36.9 C) (Oral)  Resp 15  Ht 6' (1.829 m)  Wt 236 lb 5 oz (107.191 kg)  BMI 32.04 kg/m2  SpO2 95% Physical Exam  Constitutional: He is oriented to person, place, and time. He appears well-developed and well-nourished. No distress.  HENT:  Head: Normocephalic and atraumatic.  Mouth/Throat: Oropharynx is clear and moist.  Eyes: EOM are normal. Pupils are equal, round, and reactive to light.  Neck: Normal range of motion. Neck supple.  Cardiovascular: Normal rate and regular rhythm.   Pulmonary/Chest: Effort normal and breath sounds normal. No respiratory distress. He has no  wheezes. He has no rales.  Abdominal: Soft. Bowel sounds are normal.  Musculoskeletal: Normal range of motion. He exhibits no edema or tenderness.  Neurological: He is alert and oriented to person, place, and time.  Skin: Skin is warm and dry. No rash noted. No erythema.  Psychiatric: He has a normal mood and affect. His behavior is normal.  Nursing note and vitals reviewed.   ED Course  Procedures (including critical care time) DIAGNOSTIC STUDIES: Oxygen Saturation is 96% on RA, adequate by my interpretation.    COORDINATION OF CARE: 6:31 AM- Pt advised of plan for treatment and pt agrees.    Labs Review Labs Reviewed  CBC - Abnormal; Notable for the following:    RBC 3.96 (*)    Hemoglobin 12.0 (*)    HCT 35.7 (*)  All other components within normal limits  BASIC METABOLIC PANEL - Abnormal; Notable for the following:    Potassium 3.2 (*)    Glucose, Bld 109 (*)    GFR calc non Af Amer 71 (*)    GFR calc Af Amer 82 (*)    All other components within normal limits  BRAIN NATRIURETIC PEPTIDE  I-STAT TROPOININ, ED    Imaging Review Dg Chest 2 View  04/30/2014   CLINICAL DATA:  Initial evaluation for cough, shortness of breath, fever.  EXAM: CHEST  2 VIEW  COMPARISON:  Prior radiograph from 08/16/2012  FINDINGS: Cardiac and mediastinal silhouettes are stable in size and contour, and remain within normal limits.  Lungs are normally inflated. There is mild hazy opacity within the left lung base, suspicious for possible mild or developing pneumonia. No other focal infiltrates. No pulmonary edema or pleural effusion. No pneumothorax.  No acute osseus abnormality.  IMPRESSION: Subtle hazy opacity within the left lung base, suspicious for possible mild or developing pneumonia.   Electronically Signed   By: Jeannine Boga M.D.   On: 04/30/2014 02:25     EKG Interpretation None      MDM   Final diagnoses:  Community acquired pneumonia      I personally performed the  services described in this documentation, which was scribed in my presence. The recorded information has been reviewed and is accurate.     Patient's symptoms improved. Given Rocephin in the emergency department and we'll discharge home with Z-Pak. He's been given return precautions and advised to follow with his primary doctor early next week.  Martin Rice, MD 04/30/14 [phone]

## 2014-04-30 NOTE — ED Notes (Signed)
Pt. Left with all belongings and refused wheelchair 

## 2014-04-30 NOTE — Discharge Instructions (Signed)

## 2014-05-03 ENCOUNTER — Other Ambulatory Visit: Payer: Self-pay | Admitting: Geriatric Medicine

## 2014-05-03 MED ORDER — LOSARTAN POTASSIUM 100 MG PO TABS: 100.0000 mg | ORAL_TABLET | Freq: Every day | ORAL | Status: DC

## 2014-05-14 ENCOUNTER — Inpatient Hospital Stay: Payer: Self-pay | Admitting: Internal Medicine

## 2014-05-27 ENCOUNTER — Emergency Department (HOSPITAL_COMMUNITY): Payer: BC Managed Care – PPO

## 2014-05-27 ENCOUNTER — Emergency Department (HOSPITAL_COMMUNITY)
Admission: EM | Admit: 2014-05-27 | Discharge: 2014-05-27 | Disposition: A | Discharge status: Home / Self Care | Payer: BC Managed Care – PPO | Attending: Emergency Medicine | Admitting: Emergency Medicine

## 2014-05-27 ENCOUNTER — Encounter (HOSPITAL_COMMUNITY): Payer: Self-pay | Admitting: Neurology

## 2014-05-27 DIAGNOSIS — Z79899 Other long term (current) drug therapy: Secondary | ICD-10-CM | POA: Insufficient documentation

## 2014-05-27 DIAGNOSIS — M1990 Unspecified osteoarthritis, unspecified site: Secondary | ICD-10-CM | POA: Insufficient documentation

## 2014-05-27 DIAGNOSIS — K219 Gastro-esophageal reflux disease without esophagitis: Secondary | ICD-10-CM | POA: Diagnosis not present

## 2014-05-27 DIAGNOSIS — Z87442 Personal history of urinary calculi: Secondary | ICD-10-CM | POA: Insufficient documentation

## 2014-05-27 DIAGNOSIS — Z87891 Personal history of nicotine dependence: Secondary | ICD-10-CM | POA: Insufficient documentation

## 2014-05-27 DIAGNOSIS — I10 Essential (primary) hypertension: Secondary | ICD-10-CM | POA: Diagnosis not present

## 2014-05-27 DIAGNOSIS — R109 Unspecified abdominal pain: Secondary | ICD-10-CM

## 2014-05-27 DIAGNOSIS — F419 Anxiety disorder, unspecified: Secondary | ICD-10-CM | POA: Insufficient documentation

## 2014-05-27 DIAGNOSIS — Z8639 Personal history of other endocrine, nutritional and metabolic disease: Secondary | ICD-10-CM | POA: Insufficient documentation

## 2014-05-27 LAB — CBC WITH DIFFERENTIAL/PLATELET
Basophils Absolute: 0 10*3/uL (ref 0.0–0.1)
Basophils Relative: 0 % (ref 0–1)
Eosinophils Absolute: 0 10*3/uL (ref 0.0–0.7)
Eosinophils Relative: 1 % (ref 0–5)
HCT: 38.9 % — ABNORMAL LOW (ref 39.0–52.0)
HEMOGLOBIN: 13.5 g/dL (ref 13.0–17.0)
LYMPHS ABS: 2.4 10*3/uL (ref 0.7–4.0)
Lymphocytes Relative: 27 % (ref 12–46)
MCH: 31.1 pg (ref 26.0–34.0)
MCHC: 34.7 g/dL (ref 30.0–36.0)
MCV: 89.6 fL (ref 78.0–100.0)
MONOS PCT: 7 % (ref 3–12)
Monocytes Absolute: 0.6 10*3/uL (ref 0.1–1.0)
Neutro Abs: 5.8 10*3/uL (ref 1.7–7.7)
Neutrophils Relative %: 65 % (ref 43–77)
PLATELETS: 228 10*3/uL (ref 150–400)
RBC: 4.34 MIL/uL (ref 4.22–5.81)
RDW: 12.9 % (ref 11.5–15.5)
WBC: 8.8 10*3/uL (ref 4.0–10.5)

## 2014-05-27 LAB — URINALYSIS, ROUTINE W REFLEX MICROSCOPIC
Glucose, UA: NEGATIVE mg/dL
Hgb urine dipstick: NEGATIVE
Ketones, ur: 15 mg/dL — AB
LEUKOCYTES UA: NEGATIVE
NITRITE: NEGATIVE
Protein, ur: 30 mg/dL — AB
SPECIFIC GRAVITY, URINE: 1.02 (ref 1.005–1.030)
Urobilinogen, UA: 0.2 mg/dL (ref 0.0–1.0)
pH: 5 (ref 5.0–8.0)

## 2014-05-27 LAB — COMPREHENSIVE METABOLIC PANEL
ALT: 68 U/L — ABNORMAL HIGH (ref 0–53)
AST: 52 U/L — AB (ref 0–37)
Albumin: 4.7 g/dL (ref 3.5–5.2)
Alkaline Phosphatase: 35 U/L — ABNORMAL LOW (ref 39–117)
Anion gap: 10 (ref 5–15)
BILIRUBIN TOTAL: 1 mg/dL (ref 0.3–1.2)
CO2: 29 mmol/L (ref 19–32)
CREATININE: 1.01 mg/dL (ref 0.50–1.35)
Calcium: 9.3 mg/dL (ref 8.4–10.5)
Chloride: 101 mEq/L (ref 96–112)
GFR calc Af Amer: >90 mL/min (ref >90)
GFR, EST NON AFRICAN AMERICAN: 84 mL/min — AB (ref >90)
GLUCOSE: 162 mg/dL — AB (ref 70–99)
Potassium: 3.1 mmol/L — ABNORMAL LOW (ref 3.5–5.1)
Sodium: 140 mmol/L (ref 135–145)
TOTAL PROTEIN: 7.7 g/dL (ref 6.0–8.3)

## 2014-05-27 LAB — LIPASE, BLOOD: Lipase: 31 U/L (ref 11–59)

## 2014-05-27 LAB — URINE MICROSCOPIC-ADD ON

## 2014-05-27 MED ORDER — ONDANSETRON HCL 4 MG/2ML IJ SOLN
4.0000 mg | Freq: Once | INTRAMUSCULAR | Status: AC
  Administered 2014-05-27: 4 mg via INTRAVENOUS
  Filled 2014-05-27: qty 2

## 2014-05-27 MED ORDER — SODIUM CHLORIDE 0.9 % IV BOLUS (SEPSIS)
500.0000 mL | Freq: Once | INTRAVENOUS | Status: AC
  Administered 2014-05-27: 500 mL via INTRAVENOUS

## 2014-05-27 NOTE — ED Provider Notes (Signed)
CSN: [id number]     Arrival date & time 05/27/14  1340 History   First MD Initiated Contact with Patient 05/27/14 1610     Chief Complaint  Patient presents with  . Abdominal Pain     (Consider location/radiation/quality/duration/timing/severity/associated sxs/prior Treatment) HPI [name] is a 54 y.o. male with a history of pancreatitis, nephrolithiasis comes in for evaluation of abdominal discomfort. Patient states discomfort started on Sunday. He rates his discomfort as 8/10. He characterizes it as a intermittent, sharp discomfort localized to his right flank and sometimes into his abdomen. He reports his last bowel movement was 2 days ago, is still passing gas. Eating seems to exacerbate the discomfort. He does admit to drinking a fifth of scotch and some bud light over the weekend, which is not typical for him. He also reports that it is difficult for him to find a comfortable position. He reports subjective fevers at home to 101. There is associated nausea, but no vomiting. He has taken OxyContin with some relief. Denies headache, changes in vision, chest pain, shortness of breath, cough, vomiting, dysuria, hematuria, diarrhea, bloody stools, numbness or weakness.  Of note, he reports he forgot to take his blood pressure medication this morning due to his abdominal discomfort.  Past Medical History  Diagnosis Date  . Ureteral stent displacement 1990  . Pancreatitis     Hx of   . Hyperlipemia   . GERD (gastroesophageal reflux disease)   . Alcohol abuse   . Arthritis   . Diverticulosis of colon (without mention of hemorrhage)   . Nephrolithiasis   . Kidney stones   . Hypertension     dr Ronalee Belts  norins  . Anxiety    Past Surgical History  Procedure Laterality Date  . Ureteroscopy      and stone retreval  . A2 pulley flexor sheath cyst 4th finger excisional biopsy      '03  . Anterior cruciate ligament repair      R knee  . Knee surgery  2008  . Esophagogastroduodenoscopy  N/A 07/09/2012    Procedure: ESOPHAGOGASTRODUODENOSCOPY (EGD);  Surgeon: Inda Castle, MD;  Location: [facility];  Service: Endoscopy;  Laterality: N/A;  . Shoulder arthroscopy with subacromial decompression and open rotator c Right 12/26/2012    Procedure: RIGHT SHOULDER ARTHROSCOPY WITH SUBACROMIAL DECOMPRESSION MINI OPEN ROTATOR CUFF REPAIR, OPEN BICEP TENODESIS OPEN DISTAL CLAVICLE RESECTION  ;  Surgeon: Augustin Schooling, MD;  Location: [place];  Service: Orthopedics;  Laterality: Right;   Family History  Problem Relation Age of Onset  . Hypertension Father     X4 siblings  . Diabetes Mother     x2 brothers  . Heart disease Father    History  Substance Use Topics  . Smoking status: Former Smoker    Types: Cigarettes    Quit date: 07/10/1998  . Smokeless tobacco: Never Used  . Alcohol Use: 0.0 oz/week     Comment: daily    Review of Systems  All other systems reviewed and are negative. A 10 point review of systems was completed and was negative except for pertinent positives and negatives as mentioned in the history of present illness      Allergies  Shrimp and Vicodin  Home Medications   Prior to Admission medications   Medication Sig Start Date End Date Taking? Authorizing Provider  amLODipine (NORVASC) 10 MG tablet Take 1 tablet (10 mg total) by mouth daily. 04/27/14  Yes Olga Millers, MD  ibuprofen (ADVIL,MOTRIN) 600 MG tablet Take 1 tablet (600 mg total) by mouth every 6 (six) hours as needed. 04/30/14  Yes Julianne Rice, MD  losartan (COZAAR) 100 MG tablet Take 1 tablet (100 mg total) by mouth daily. 05/03/14  Yes Olga Millers, MD  methocarbamol (ROBAXIN) 500 MG tablet Take 500 mg by mouth 4 (four) times daily. Muscle spasms   Yes Historical Provider, MD  Multiple Vitamins-Minerals (MULTIVITAMIN WITH MINERALS) tablet Take 1 tablet by mouth daily.   Yes Historical Provider, MD  omeprazole (PRILOSEC) 20 MG capsule Take 1 capsule (20 mg total) by mouth  daily. 04/27/14  Yes Olga Millers, MD  oxyCODONE-acetaminophen (PERCOCET) 10-325 MG per tablet Take 1 tablet by mouth every 4 (four) hours as needed for pain.   Yes Historical Provider, MD  polycarbophil (FIBERCON) 625 MG tablet Take 625 mg by mouth daily.   Yes Historical Provider, MD   BP 151/103 mmHg  Pulse 81  Temp(Src) 98.6 F (37 C) (Oral)  Resp 16  Ht 6' (1.829 m)  Wt 230 lb (104.327 kg)  BMI 31.19 kg/m2  SpO2 94% Physical Exam  Constitutional: He is oriented to person, place, and time. He appears well-developed and well-nourished.  HENT:  Head: Normocephalic and atraumatic.  Mouth/Throat: Oropharynx is clear and moist.  Eyes: Conjunctivae are normal. Pupils are equal, round, and reactive to light. Right eye exhibits no discharge. Left eye exhibits no discharge. No scleral icterus.  Neck: Normal range of motion. Neck supple.  Cardiovascular: Normal rate, regular rhythm and normal heart sounds.   Pulmonary/Chest: Effort normal and breath sounds normal. No respiratory distress. He has no wheezes. He has no rales.  Abdominal: Soft. Bowel sounds are normal. He exhibits no distension and no mass. There is no tenderness. There is no rebound and no guarding.  No CVA tenderness  Musculoskeletal: Normal range of motion. He exhibits no tenderness.  Neurological: He is alert and oriented to person, place, and time.  Cranial Nerves II-XII grossly intact  Skin: Skin is warm and dry. No rash noted.  Psychiatric: He has a normal mood and affect.  Nursing note and vitals reviewed.   ED Course  Procedures (including critical care time) Labs Review Labs Reviewed  CBC WITH DIFFERENTIAL - Abnormal; Notable for the following:    HCT 38.9 (*)    All other components within normal limits  COMPREHENSIVE METABOLIC PANEL - Abnormal; Notable for the following:    Potassium 3.1 (*)    Glucose, Bld 162 (*)    BUN <5 (*)    AST 52 (*)    ALT 68 (*)    Alkaline Phosphatase 35 (*)    GFR  calc non Af Amer 84 (*)    All other components within normal limits  URINALYSIS, ROUTINE W REFLEX MICROSCOPIC - Abnormal; Notable for the following:    Color, Urine AMBER (*)    APPearance CLOUDY (*)    Bilirubin Urine SMALL (*)    Ketones, ur 15 (*)    Protein, ur 30 (*)    All other components within normal limits  URINE MICROSCOPIC-ADD ON - Abnormal; Notable for the following:    Bacteria, UA FEW (*)    All other components within normal limits  LIPASE, BLOOD    Imaging Review Ct Abdomen Pelvis Wo Contrast  05/27/2014   CLINICAL DATA:  RIGHT flank pain radiating to back, feels like when he had kidney stones in the past  EXAM: CT ABDOMEN AND PELVIS WITHOUT  CONTRAST  TECHNIQUE: Multidetector CT imaging of the abdomen and pelvis was performed following the standard protocol without IV contrast. Sagittal and coronal MPR images reconstructed from axial data set. Oral contrast not administered for this indication.  COMPARISON:  08/05/2012  FINDINGS: Lung bases grossly clear though respiratory motion artifacts are present.  Diffuse fatty infiltration of liver.  No urinary tract calcification, hydronephrosis or ureteral dilatation.  11 x 10 mm low-attenuation lesion likely cyst upper pole LEFT kidney unchanged.  Bladder incompletely distended, wall appearing thickened though this may be an artifact.  Prostate gland not enlarged.  Within limits of a nonenhanced technique, liver, spleen, pancreas, kidneys, and adrenal glands otherwise normal appearance.  Normal appendix.  Stomach and bowel loops normal appearance for technique.  Tiny umbilical hernia containing fat.  No mass, adenopathy, free fluid, or free air.  Dilated inguinal canals containing fat suspicious for small inguinal hernias.  Scattered pelvic phleboliths.  No acute osseous findings.  Scattered atherosclerotic calcifications.  IMPRESSION: Fatty infiltration of liver.  Tiny stable LEFT renal cyst.  Tiny umbilical and suspected small BILATERAL  inguinal hernias.  Otherwise negative exam.   Electronically Signed   By: Lavonia Dana M.D.   On: 05/27/2014 16:20     EKG Interpretation None       Meds given in ED:  Medications  sodium chloride 0.9 % bolus 500 mL (0 mLs Intravenous Stopped 05/27/14 1748)  ondansetron (ZOFRAN) injection 4 mg (4 mg Intravenous Given 05/27/14 1648)    Discharge Medication List as of 05/27/2014  5:43 PM     Filed Vitals:   05/27/14 1730 05/27/14 1745 05/27/14 1751 05/27/14 1754  BP: 147/100 151/103 151/103   Pulse: 75 77 81   Temp:    98.6 F (37 C)  TempSrc:    Oral  Resp:   16   Height:      Weight:      SpO2: 95% 95% 94%     MDM  Vitals stable -afebrile, hypertension likely due to missed medications. Pt resting comfortably in ED. pain managed in ED. Nausea resolved. PE--benign abdominal exam. Labwork noncontributory Imaging--CT abdomen shows no acute intra-abdominal pathology  Encouraged patient on cessation of alcohol, follow-up with PCP for serial testing. I discussed all relevant lab findings and imaging results with pt and they verbalized understanding. Discussed f/u with PCP within 48 hrs and return precautions, pt very amenable to plan.   Patient stable, in good condition and ambulates out of the ED without difficulty.  Prior to patient discharge, I discussed and reviewed this case with Dr.Zackowski    Final diagnoses:  Abdominal discomfort        Verl Dicker, PA-C 05/27/14 [id number]  Fredia Sorrow, MD 05/28/14 0021

## 2014-05-27 NOTE — ED Notes (Signed)
Pt verbalized understanding of follow up appointment with pcp. No further questions.

## 2014-05-27 NOTE — Discharge Instructions (Signed)
Abdominal Pain Many things can cause abdominal pain. Usually, abdominal pain is not caused by a disease and will improve without treatment. It can often be observed and treated at home. Your health care provider will do a physical exam and possibly order blood tests and X-rays to help determine the seriousness of your pain. However, in many cases, more time must pass before a clear cause of the pain can be found. Before that point, your health care provider may not know if you need more testing or further treatment. HOME CARE INSTRUCTIONS  Monitor your abdominal pain for any changes. The following actions may help to alleviate any discomfort you are experiencing:  Only take over-the-counter or prescription medicines as directed by your health care provider.  Do not take laxatives unless directed to do so by your health care provider.  Try a clear liquid diet (broth, tea, or water) as directed by your health care provider. Slowly move to a bland diet as tolerated. SEEK MEDICAL CARE IF:  You have unexplained abdominal pain.  You have abdominal pain associated with nausea or diarrhea.  You have pain when you urinate or have a bowel movement.  You experience abdominal pain that wakes you in the night.  You have abdominal pain that is worsened or improved by eating food.  You have abdominal pain that is worsened with eating fatty foods.  You have a fever. SEEK IMMEDIATE MEDICAL CARE IF:   Your pain does not go away within 2 hours.  You keep throwing up (vomiting).  Your pain is felt only in portions of the abdomen, such as the right side or the left lower portion of the abdomen.  You pass bloody or black tarry stools. MAKE SURE YOU:  Understand these instructions.   Will watch your condition.   Will get help right away if you are not doing well or get worse.  Document Released: 01/31/2005 Document Revised: 04/28/2013 Document Reviewed: 12/31/2012 [facility] Patient Information  2015 [place], [place]. This information is not intended to replace advice given to you by your health care provider. Make sure you discuss any questions you have with your health care provider.  You were evaluated in the ED today for your abdominal pain. There does not appear to be an emergent cause for your symptoms at this time. It is important for you to follow-up with your primary care for further evaluation and management of your symptoms. Return to ED for worsening symptoms.

## 2014-05-27 NOTE — ED Notes (Signed)
Pt stated that he started taking oxycontin Tuesday and it helps for a little bit, last time taken was 2am this morning.

## 2014-05-27 NOTE — ED Notes (Signed)
Pt given urinal.

## 2014-05-27 NOTE — ED Notes (Signed)
Pt reports right sided abd pain for 3 days. Today the pain moved into his bladder. Denies v/d. Pt is a x 4. Has hx of kidney stones. Denies urinary s/s

## 2014-08-19 ENCOUNTER — Other Ambulatory Visit: Payer: Self-pay | Admitting: Specialist

## 2014-08-19 DIAGNOSIS — M545 Low back pain, unspecified: Secondary | ICD-10-CM

## 2014-08-30 ENCOUNTER — Ambulatory Visit
Admission: RE | Admit: 2014-08-30 | Discharge: 2014-08-30 | Disposition: A | Discharge status: Home / Self Care | Payer: Self-pay | Source: Ambulatory Visit | Attending: Specialist | Admitting: Specialist

## 2014-08-30 ENCOUNTER — Other Ambulatory Visit: Payer: Self-pay | Admitting: Specialist

## 2014-08-30 ENCOUNTER — Ambulatory Visit
Admission: RE | Admit: 2014-08-30 | Discharge: 2014-08-30 | Disposition: A | Discharge status: Home / Self Care | Payer: Worker's Compensation | Source: Ambulatory Visit | Attending: Specialist | Admitting: Specialist

## 2014-08-30 DIAGNOSIS — M549 Dorsalgia, unspecified: Secondary | ICD-10-CM

## 2014-08-30 DIAGNOSIS — M545 Low back pain, unspecified: Secondary | ICD-10-CM

## 2014-08-30 MED ORDER — ONDANSETRON HCL 4 MG/2ML IJ SOLN: 4.0000 mg | Freq: Four times a day (QID) | INTRAMUSCULAR | Status: DC | PRN

## 2014-08-30 MED ORDER — MEPERIDINE HCL 100 MG/ML IJ SOLN
100.0000 mg | Freq: Once | INTRAMUSCULAR | Status: AC
  Administered 2014-08-30: 100 mg via INTRAMUSCULAR

## 2014-08-30 MED ORDER — IOHEXOL 180 MG/ML  SOLN
15.0000 mL | Freq: Once | INTRAMUSCULAR | Status: AC | PRN
  Administered 2014-08-30: 15 mL via INTRATHECAL

## 2014-08-30 MED ORDER — ONDANSETRON HCL 4 MG/2ML IJ SOLN
4.0000 mg | Freq: Once | INTRAMUSCULAR | Status: AC
  Administered 2014-08-30: 4 mg via INTRAMUSCULAR

## 2014-08-30 MED ORDER — DIAZEPAM 5 MG PO TABS
10.0000 mg | ORAL_TABLET | Freq: Once | ORAL | Status: AC
  Administered 2014-08-30: 10 mg via ORAL

## 2014-08-30 NOTE — Discharge Instructions (Signed)

## 2014-09-16 ENCOUNTER — Encounter: Payer: Self-pay | Admitting: Internal Medicine

## 2014-09-16 ENCOUNTER — Ambulatory Visit (INDEPENDENT_AMBULATORY_CARE_PROVIDER_SITE_OTHER): Payer: BC Managed Care – PPO | Admitting: Internal Medicine

## 2014-09-16 VITALS — BP 152/108 | HR 96 | Temp 98.2°F | Ht 72.0 in | Wt 236.5 lb

## 2014-09-16 DIAGNOSIS — I10 Essential (primary) hypertension: Secondary | ICD-10-CM | POA: Diagnosis not present

## 2014-09-16 MED ORDER — HYDRALAZINE HCL 25 MG PO TABS: ORAL_TABLET | ORAL | Status: DC

## 2014-09-16 MED ORDER — LOSARTAN POTASSIUM-HCTZ 100-25 MG PO TABS: 1.0000 | ORAL_TABLET | Freq: Every day | ORAL | Status: DC

## 2014-09-16 NOTE — Progress Notes (Signed)
Pre visit review using our clinic review tool, if applicable. No additional management support is needed unless otherwise documented below in the visit note. 

## 2014-09-16 NOTE — Progress Notes (Signed)
   Subjective:    Patient ID: [name], male    DOB: [birth year], 54 y.o.   MRN: [id number]  HPI His blood pressure has risen the last week in the context of severe back pain for which he had a myelogram. On 5/9 it was 188/128. He's had some associated occipital headaches which he relates to tension. He had been taking Excedrin for these.  He's also had some edema. He is on amlodipine.  He has been compliant with his medicines. He does not add salt to his food.  He has no history of gout.  Review of Systems  Chest pain, palpitations, tachycardia, exertional dyspnea, paroxysmal nocturnal dyspnea, or claudication  are absent.      Objective:   Physical Exam  Appears healthy and well-nourished & in no acute distress. Head is shaven. He has a mustache and beard.  Arteriolar narrowing is present on fundal exam.No hemorrhages or exudates seen. No carotid bruits are present.No neck vein distention present at 10 - 15 degrees. Thyroid normal to palpation  Heart rhythm and rate are normal with no gallop or murmur. S4 present  Chest is clear with no increased work of breathing  There is no evidence of aortic aneurysm or renal artery bruits  Abdomen protuberant but soft with no organomegaly or masses. No HJR  No clubbing, cyanosis or edema present.  Pedal pulses are intact   No ischemic skin changes are present . Fingernails healthy   Alert and oriented. Strength, tone, DTRs reflexes normal        Assessment & Plan:  #1 hypertension  Plan: Change losartan to losartan HCT. Hydralazine as necessary blood pressure spikes. Pheochromocytoma assessment if spikes persist

## 2014-09-16 NOTE — Patient Instructions (Signed)
Minimal Blood Pressure Goal= AVERAGE < 140/90;  Ideal is an AVERAGE < 135/85. This AVERAGE should be calculated from @ least 5-7 BP readings taken @ different times of day on different days of week. You should not respond to isolated BP readings , but rather the AVERAGE for that week .Please bring your  blood pressure cuff to office visits to verify that it is reliable.It  can also be checked against the blood pressure device at the pharmacy. Finger or wrist cuffs are not dependable; an arm cuff is.  Take the hydralazine every 8 hours as needed if your blood pressures consistently above 160/90

## 2014-09-22 ENCOUNTER — Telehealth: Payer: Self-pay | Admitting: Internal Medicine

## 2014-09-22 NOTE — Telephone Encounter (Signed)
Pt called and said that

## 2015-01-24 ENCOUNTER — Telehealth: Payer: Self-pay

## 2015-01-24 NOTE — Telephone Encounter (Signed)
PA started via cover my meds.  

## 2015-01-25 MED ORDER — OMEPRAZOLE 20 MG PO CPDR: 20.0000 mg | DELAYED_RELEASE_CAPSULE | Freq: Every day | ORAL | Status: DC

## 2015-01-25 NOTE — Telephone Encounter (Signed)
PA approved  New rx sent to pt pharmacy.

## 2015-03-29 ENCOUNTER — Other Ambulatory Visit: Payer: Self-pay | Admitting: Internal Medicine

## 2015-06-04 ENCOUNTER — Other Ambulatory Visit: Payer: Self-pay | Admitting: Internal Medicine

## 2015-06-19 ENCOUNTER — Other Ambulatory Visit: Payer: Self-pay | Admitting: Internal Medicine

## 2015-06-28 ENCOUNTER — Telehealth: Payer: Self-pay | Admitting: Internal Medicine

## 2015-06-28 NOTE — Telephone Encounter (Signed)
Please advise if he should take this medication. He was prescribed it May of last year and never had to use it. This is the first time he has noticed that his pressure has gone up that high and has never had to take it. Please advise, thanks.

## 2015-06-28 NOTE — Telephone Encounter (Signed)
Would recommend to start taking the hydralazine 25 mg BID until acute visit. Needs visit this week this someone.

## 2015-06-28 NOTE — Telephone Encounter (Signed)
Patient aware and will schedule an appt.

## 2015-06-28 NOTE — Telephone Encounter (Signed)
Pt was given hydrALAZINE (APRESOLINE) 25 MG tablet [id number] last year by Dr. Linna Darner and he told him take if his bp is over 160/90. His bp is running very high at 200/100. He has yet to ever take this medicine and is wondering if he should go ahead and take this.  Can you please call him asap

## 2015-08-29 ENCOUNTER — Telehealth: Payer: Self-pay | Admitting: *Deleted

## 2015-08-29 NOTE — Telephone Encounter (Signed)
Left msg on triage stating he bought some supplement otc called [facility] [employer] Concentrated dietary supplement wanting md recommendation abt taking...[name]

## 2015-08-29 NOTE — Telephone Encounter (Signed)
Notified pt w/MD response.../lmb 

## 2015-08-29 NOTE — Telephone Encounter (Signed)
I am not familiar with ingredients of this particular vitamin. If he wants it evaluated would need to bring it for visit. Otherwise in general vitamins are not needed but generally not harmful.

## 2015-09-21 ENCOUNTER — Other Ambulatory Visit: Payer: Self-pay | Admitting: Internal Medicine

## 2015-09-22 ENCOUNTER — Other Ambulatory Visit: Payer: Self-pay

## 2015-09-22 MED ORDER — LOSARTAN POTASSIUM-HCTZ 100-25 MG PO TABS: 1.0000 | ORAL_TABLET | Freq: Every day | ORAL | Status: DC

## 2015-12-23 ENCOUNTER — Other Ambulatory Visit: Payer: Self-pay | Admitting: Internal Medicine

## 2016-04-02 ENCOUNTER — Other Ambulatory Visit: Payer: Self-pay | Admitting: Internal Medicine

## 2016-06-27 ENCOUNTER — Other Ambulatory Visit: Payer: Self-pay | Admitting: Internal Medicine

## 2016-06-27 NOTE — Telephone Encounter (Signed)
Medications refused due to patient needing apt

## 2016-07-05 ENCOUNTER — Telehealth: Payer: Self-pay | Admitting: Internal Medicine

## 2016-07-05 NOTE — Telephone Encounter (Signed)
Patient stated he is not working and don't have insurance.  So he cannot come in to see the doctor.  He wants to know if there is any other alternative so he can get his meds.

## 2016-07-05 NOTE — Telephone Encounter (Signed)
Left message that patient needs visit for future refills

## 2016-07-06 NOTE — Telephone Encounter (Signed)
Patient is currently without insurance and is trying to find some that he can afford but would like to be referred to a free clinic if possible until he is able to get insurance

## 2016-07-06 NOTE — Telephone Encounter (Signed)
Patient called in.  I gave him MD response.  Amlodipine is requiring a PA to be processed.  Called pharmacy to get them to send this over.

## 2016-07-06 NOTE — Telephone Encounter (Signed)
Patient contacted and stated awareness 

## 2016-07-06 NOTE — Telephone Encounter (Signed)
He does not need referral but [facility] a free clinic right by [facility] [place] and he can make an appointment there.

## 2016-07-06 NOTE — Telephone Encounter (Signed)
I have not seen since 2015 so would need visit for safety of the medications to continue with any more refills.

## 2016-07-10 ENCOUNTER — Encounter (INDEPENDENT_AMBULATORY_CARE_PROVIDER_SITE_OTHER): Payer: Self-pay | Admitting: Physician Assistant

## 2016-07-10 ENCOUNTER — Ambulatory Visit (INDEPENDENT_AMBULATORY_CARE_PROVIDER_SITE_OTHER): Payer: Self-pay | Admitting: Physician Assistant

## 2016-07-10 VITALS — BP 129/84 | HR 93 | Temp 98.2°F | Ht 70.0 in | Wt 244.6 lb

## 2016-07-10 DIAGNOSIS — F1010 Alcohol abuse, uncomplicated: Secondary | ICD-10-CM

## 2016-07-10 DIAGNOSIS — I10 Essential (primary) hypertension: Secondary | ICD-10-CM

## 2016-07-10 MED ORDER — TOPIRAMATE 25 MG PO TABS: 25.0000 mg | ORAL_TABLET | Freq: Two times a day (BID) | ORAL | 1 refills | Status: DC

## 2016-07-10 NOTE — Patient Instructions (Signed)
Alcohol Abuse and Nutrition °Alcohol abuse is any pattern of alcohol consumption that harms your health, relationships, or work. Alcohol abuse can affect how your body breaks down and absorbs nutrients from food by causing your liver to work abnormally. Additionally, many people who abuse alcohol do not eat enough carbohydrates, protein, fat, vitamins, and minerals. This can cause poor nutrition (malnutrition) and a lack of nutrients (nutrient deficiencies), which can lead to further complications. °Nutrients that are commonly lacking (deficient) among people who abuse alcohol include: °· Vitamins. °¨ Vitamin A. This is stored in your liver. It is important for your vision, metabolism, and ability to fight off infections (immunity). °¨ B vitamins. These include vitamins such as folate, thiamin, and niacin. These are important in new cell growth and maintenance. °¨ Vitamin C. This plays an important role in iron absorption, wound healing, and immunity. °¨ Vitamin D. This is produced by your liver, but you can also get vitamin D from food. Vitamin D is necessary for your body to absorb and use calcium. °· Minerals. °¨ Calcium. This is important for your bones and your heart and blood vessel (cardiovascular) function. °¨ Iron. This is important for blood, muscle, and nervous system functioning. °¨ Magnesium. This plays an important role in muscle and nerve function, and it helps to control blood sugar and blood pressure. °¨ Zinc. This is important for the normal function of your nervous system and digestive system (gastrointestinal tract). °Nutrition is an essential component of therapy for alcohol abuse. Your health care provider or dietitian will work with you to design a plan that can help restore nutrients to your body and prevent potential complications. °What is my plan? °Your dietitian may develop a specific diet plan that is based on your condition and any other complications you may have. A diet plan will  commonly include: °· A balanced diet. °¨ Grains: 6-8 oz per day. °¨ Vegetables: 2-3 cups per day. °¨ Fruits: 1-2 cups per day. °¨ Meat and other protein: 5-6 oz per day. °¨ Dairy: 2-3 cups per day. °· Vitamin and mineral supplements. °What do I need to know about alcohol and nutrition? °· Consume foods that are high in antioxidants, such as grapes, berries, nuts, green tea, and dark green and orange vegetables. This can help to counteract some of the stress that is placed on your liver by consuming alcohol. °· Avoid food and drinks that are high in fat and sugar. Foods such as sugared soft drinks, salty snack foods, and candy contain empty calories. This means that they lack important nutrients such as protein, fiber, and vitamins. °· Eat frequent meals and snacks. Try to eat 5-6 small meals each day. °· Eat a variety of fresh fruits and vegetables each day. This will help you get plenty of water, fiber, and vitamins in your diet. °· Drink plenty of water and other clear fluids. Try to drink at least 48-64 oz (1.5-2 L) of water per day. °· If you are a vegetarian, eat a variety of protein-rich foods. Pair whole grains with plant-based proteins at meals and snacks to obtain the greatest nutrient benefit from your food. For example, eat rice with beans, put peanut butter on whole-grain toast, or eat oatmeal with sunflower seeds. °· Soak beans and whole grains overnight before cooking. This can help your body to absorb the nutrients more easily. °· Include foods fortified with vitamins and minerals in your diet. Commonly fortified foods include milk, orange juice, cereal, and bread. °· If you   are malnourished, your dietitian may recommend a high-protein, high-calorie diet. This may include: °¨ 2,000-3,000 calories (kilocalories) per day. °¨ 70-100 grams of protein per day. °· Your health care provider may recommend a complete nutritional supplement beverage. This can help to restore calories, protein, and vitamins to  your body. Depending on your condition, you may be advised to consume this instead of or in addition to meals. °· Limit your intake of caffeine. Replace drinks like coffee and black tea with decaffeinated coffee and herbal tea. °· Eat a variety of foods that are high in omega fatty acids. These include fish, nuts and seeds, and soybeans. These foods may help your liver to recover and may also stabilize your mood. °· Certain medicines may cause changes in your appetite, taste, and weight. Work with your health care provider and dietitian to make any adjustments to your medicines and diet plan. °· Include other healthy lifestyle choices in your daily routine. °¨ Be physically active. °¨ Get enough sleep. °¨ Spend time doing activities that you enjoy. °· If you are unable to take in enough food and calories by mouth, your health care provider may recommend a feeding tube. This is a tube that passes through your nose and throat, directly into your stomach. Nutritional supplement beverages can be given to you through the feeding tube to help you get the nutrients you need. °· Take vitamin or mineral supplements as recommended by your health care provider. °What foods can I eat? °Grains  °Enriched pasta. Enriched rice. Fortified whole-grain bread. Fortified whole-grain cereal. Barley. Brown rice. Quinoa. Millet. °Vegetables  °All fresh, frozen, and canned vegetables. Spinach. Kale. Artichoke. Carrots. Winter squash and pumpkin. Sweet potatoes. Broccoli. Cabbage. Cucumbers. Tomatoes. Sweet peppers. Green beans. Peas. Corn. °Fruits  °All fresh and frozen fruits. Berries. Grapes. Mango. Papaya. Guava. Cherries. Apples. Bananas. Peaches. Plums. Pineapple. Watermelon. Cantaloupe. Oranges. Avocado. °Meats and Other Protein Sources  °Beef liver. Lean beef. Pork. Fresh and canned chicken. Fresh fish. Oysters. Sardines. Canned tuna. Shrimp. Eggs with yolks. Nuts and seeds. Peanut butter. Beans and lentils. Soybeans. Tofu. °Dairy    °Whole, low-fat, and nonfat milk. Whole, low-fat, and nonfat yogurt. Cottage cheese. Sour cream. Hard and soft cheeses. °Beverages  °Water. Herbal tea. Decaffeinated coffee. Decaffeinated green tea. 100% fruit juice. 100% vegetable juice. Instant breakfast shakes. °Condiments  °Ketchup. Mayonnaise. Mustard. Salad dressing. Barbecue sauce. °Sweets and Desserts  °Sugar-free ice cream. Sugar-free pudding. Sugar-free gelatin. °Fats and Oils  °Butter. Vegetable oil, flaxseed oil, olive oil, and walnut oil. °Other  °Complete nutrition shakes. Protein bars. Sugar-free gum. °The items listed above may not be a complete list of recommended foods or beverages. Contact your dietitian for more options.  °What foods are not recommended? °Grains  °Sugar-sweetened breakfast cereals. Flavored instant oatmeal. Fried breads. °Vegetables  °Breaded or deep-fried vegetables. °Fruits  °Dried fruit with added sugar. Candied fruit. Canned fruit in syrup. °Meats and Other Protein Sources  °Breaded or deep-fried meats. °Dairy  °Flavored milks. Fried cheese curds or fried cheese sticks. °Beverages  °Alcohol. Sugar-sweetened soft drinks. Sugar-sweetened tea. Caffeinated coffee and tea. °Condiments  °Sugar. Honey. Agave nectar. Molasses. °Sweets and Desserts  °Chocolate. Cake. Cookies. Candy. °Other  °Potato chips. Pretzels. Salted nuts. Candied nuts. °The items listed above may not be a complete list of foods and beverages to avoid. Contact your dietitian for more information.  °This information is not intended to replace advice given to you by your health care provider. Make sure you discuss any questions you   have with your health care provider. °Document Released: 02/15/2005 Document Revised: 08/31/2015 Document Reviewed: 11/24/2013 °Elsevier Interactive Patient Education © 2017 [employer] ° °

## 2016-07-10 NOTE — Progress Notes (Signed)
  Subjective:  Patient ID: [name], male    DOB: 12/07/1960  Age: 56 y.o. MRN: [id number]  CC: HTN management  HPI RASHAD PERLOW is a 56 y.o. male with a PMH of Hypertension, Alcohol abuse, pancreatitis, kidney stones, anxiety, GERD, diverticulosis, and ureteral stent placement that presents to establish care for his hypertension. He is currently taking 4 anti-hypertensives with good control of his blood pressure. 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 Takes muscle relaxants and narcotics for pain relief. Been to physical therapy for his back pain with some amelioration of pain . Was dismissed from his previous practice for non compliance. He continues to abuse alcohol but would like to quit. States he drinks a six pack of beer and a pint of liquor approximately 3-4 times a week.     ROS Review of Systems  Constitutional: Negative for chills, fever and malaise/fatigue.  Eyes: Negative for blurred vision.  Respiratory: Negative for shortness of breath.   Cardiovascular: Negative for chest pain and palpitations.  Gastrointestinal: Negative for abdominal pain and nausea.  Genitourinary: Negative for dysuria and hematuria.  Musculoskeletal: Negative for joint pain and myalgias.  Skin: Negative for rash.  Neurological: Negative for tingling and headaches.  Psychiatric/Behavioral: Negative for depression. The patient is not nervous/anxious.     Objective:  BP 129/84 (BP Location: Left Arm, Patient Position: Sitting, Cuff Size: Normal)   Pulse 93   Temp 98.2 F (36.8 C) (Oral)   Ht 5\' 10"  (1.778 m)   Wt 244 lb 9.6 oz (110.9 kg)   SpO2 97%   BMI 35.10 kg/m   BP/Weight 07/10/2016 09/16/2014 [id number]  Systolic BP [id number] 0000000 [id number]  Diastolic BP 84 123XX123 [id number]  Wt. (Lbs) 244.6 236.5 -  BMI 35.1 32.07 -    Physical Exam  Constitutional: He is oriented to person, place, and time.  Centrally obese, NAD, polite  HENT:  Head: Normocephalic and  atraumatic.  Eyes: Conjunctivae are normal. No scleral icterus.  Neck: Normal range of motion. Neck supple. No thyromegaly present.  Cardiovascular: Normal rate, regular rhythm and normal heart sounds.   Pulmonary/Chest: Effort normal and breath sounds normal.  Abdominal: Soft. Bowel sounds are normal. There is no tenderness.  Musculoskeletal: He exhibits no edema.  Neurological: He is alert and oriented to person, place, and time.  Skin: Skin is warm and dry. No rash noted. No erythema. No pallor.  Psychiatric: He has a normal mood and affect. His behavior is normal. Thought content normal.  Vitals reviewed.    Assessment & Plan:   1. Alcohol abuse - Drug Screen 8 w/Conf, WB - Naltrexone not started due to narcotic use for low back pain. Second line treatment, topiramate 25 mg will slowly be uptitrated to 150mg  BID.  2. Hypertension, unspecified type - CBC with Differential/Platelet - Comprehensive metabolic panel - TSH   Meds ordered this encounter  Medications  . topiramate (TOPAMAX) 25 MG tablet    Sig: Take 1 tablet (25 mg total) by mouth 2 (two) times daily.    Dispense:  30 tablet    Refill:  1    Order Specific Question:   Supervising Provider    Answer:   Tresa Garter [id number]    Follow-up: Return in about 4 weeks (around 08/07/2016) for alcohol abuse.   Clent Demark PA

## 2016-07-11 MED FILL — TOPIRAMATE 25 MG TABLET: 25 | 30 days supply | Qty: 60 | Fill #0

## 2016-07-17 LAB — COMPREHENSIVE METABOLIC PANEL
A/G RATIO: 1.6 (ref 1.2–2.2)
ALT: 56 IU/L — ABNORMAL HIGH (ref 0–44)
AST: 40 IU/L (ref 0–40)
Albumin: 4.7 g/dL (ref 3.5–5.5)
Alkaline Phosphatase: 40 IU/L (ref 39–117)
BUN/Creatinine Ratio: 17 (ref 9–20)
BUN: 17 mg/dL (ref 6–24)
Bilirubin Total: 0.4 mg/dL (ref 0.0–1.2)
CO2: 27 mmol/L (ref 18–29)
Calcium: 9.7 mg/dL (ref 8.7–10.2)
Chloride: 96 mmol/L (ref 96–106)
Creatinine, Ser: 1.01 mg/dL (ref 0.76–1.27)
GFR, EST AFRICAN AMERICAN: 97 mL/min/{1.73_m2} (ref >59)
GFR, EST NON AFRICAN AMERICAN: 84 mL/min/{1.73_m2} (ref >59)
Globulin, Total: 3 g/dL (ref 1.5–4.5)
Glucose: 105 mg/dL — ABNORMAL HIGH (ref 65–99)
POTASSIUM: 3.8 mmol/L (ref 3.5–5.2)
Sodium: 143 mmol/L (ref 134–144)
Total Protein: 7.7 g/dL (ref 6.0–8.5)

## 2016-07-17 LAB — CBC WITH DIFFERENTIAL/PLATELET
Basophils Absolute: 0.1 10*3/uL (ref 0.0–0.2)
Basos: 1 %
EOS (ABSOLUTE): 0.1 10*3/uL (ref 0.0–0.4)
EOS: 1 %
Hematocrit: 36.7 % — ABNORMAL LOW (ref 37.5–51.0)
Hemoglobin: 12.6 g/dL — ABNORMAL LOW (ref 13.0–17.7)
IMMATURE GRANS (ABS): 0 10*3/uL (ref 0.0–0.1)
Immature Granulocytes: 0 %
Lymphocytes Absolute: 3.5 10*3/uL — ABNORMAL HIGH (ref 0.7–3.1)
Lymphs: 39 %
MCH: 31.9 pg (ref 26.6–33.0)
MCHC: 34.3 g/dL (ref 31.5–35.7)
MCV: 93 fL (ref 79–97)
Monocytes Absolute: 0.9 10*3/uL (ref 0.1–0.9)
Monocytes: 10 %
NEUTROS PCT: 49 %
Neutrophils Absolute: 4.5 10*3/uL (ref 1.4–7.0)
Platelets: 258 10*3/uL (ref 150–379)
RBC: 3.95 x10E6/uL — ABNORMAL LOW (ref 4.14–5.80)
RDW: 13.2 % (ref 12.3–15.4)
WBC: 9 10*3/uL (ref 3.4–10.8)

## 2016-07-17 LAB — DRUG SCREEN 8 W/CONF, WB
Amphetamines, IA: NEGATIVE ng/mL
BARBITURATES, IA: NEGATIVE ug/mL
BENZODIAZEPINES, IA: NEGATIVE ng/mL
Cocaine/Metabolite, IA: NEGATIVE ng/mL
Opiates, IA: NEGATIVE ng/mL
Oxycodones, IA: NEGATIVE ng/mL
Phencyclidine, IA: NEGATIVE ng/mL
THC (Marijuana) Mtb, IA: NEGATIVE ng/mL

## 2016-07-17 LAB — TSH: TSH: 0.693 u[IU]/mL (ref 0.450–4.500)

## 2016-07-18 ENCOUNTER — Telehealth (INDEPENDENT_AMBULATORY_CARE_PROVIDER_SITE_OTHER): Payer: Self-pay | Admitting: Physician Assistant

## 2016-07-18 NOTE — Telephone Encounter (Signed)
Spoke with patient and assured him to buy supplements in his price range and not to worry about the milligram, patient stated he got some pills and will begin taking them. Nat Christen, CMA

## 2016-07-18 NOTE — Telephone Encounter (Signed)
Patient called stated not sure which Iron supplements to buy,  Would like to know which ones Dr recommends and doesn't remember the milligrams he has to take.  Please follow up with patient [phone]

## 2016-07-20 ENCOUNTER — Ambulatory Visit (INDEPENDENT_AMBULATORY_CARE_PROVIDER_SITE_OTHER): Payer: Self-pay

## 2016-08-05 ENCOUNTER — Other Ambulatory Visit: Payer: Self-pay | Admitting: Internal Medicine

## 2016-08-06 ENCOUNTER — Other Ambulatory Visit (INDEPENDENT_AMBULATORY_CARE_PROVIDER_SITE_OTHER): Payer: Self-pay | Admitting: *Deleted

## 2016-08-06 MED ORDER — OMEPRAZOLE 20 MG PO CPDR
20.0000 mg | DELAYED_RELEASE_CAPSULE | Freq: Every day | ORAL | 3 refills | Status: DC
Start: 2016-08-06 — End: 2016-08-17

## 2016-08-06 MED ORDER — AMLODIPINE BESYLATE 10 MG PO TABS: 10.0000 mg | ORAL_TABLET | Freq: Every day | ORAL | 3 refills | Status: DC

## 2016-08-06 MED ORDER — LOSARTAN POTASSIUM-HCTZ 100-25 MG PO TABS: ORAL_TABLET | ORAL | 3 refills | Status: DC

## 2016-08-06 MED FILL — OMEPRAZOLE 20 MG CAP: 20 | 30 days supply | Qty: 30 | Fill #0

## 2016-08-06 MED FILL — LOSARTAN-HCTZ 100-25 MG TAB: 100-25 | 30 days supply | Qty: 30 | Fill #0

## 2016-08-06 MED FILL — AMLODIPINE BESYLATE 10 MG T: 10 | 30 days supply | Qty: 30 | Fill #0

## 2016-08-06 NOTE — Telephone Encounter (Signed)
Patient verified DOB Patient was last seen on 07/13/16. Patient requested refills on BP medication and Heartburn. Refills sent to [facility]. Patient took [facility] pharmacy contact number. No further questions at this time.

## 2016-08-07 ENCOUNTER — Ambulatory Visit (INDEPENDENT_AMBULATORY_CARE_PROVIDER_SITE_OTHER): Payer: Self-pay | Admitting: Physician Assistant

## 2016-08-17 ENCOUNTER — Encounter (INDEPENDENT_AMBULATORY_CARE_PROVIDER_SITE_OTHER): Payer: Self-pay | Admitting: Physician Assistant

## 2016-08-17 ENCOUNTER — Ambulatory Visit (INDEPENDENT_AMBULATORY_CARE_PROVIDER_SITE_OTHER): Payer: Self-pay | Admitting: Physician Assistant

## 2016-08-17 VITALS — BP 159/102 | HR 92 | Temp 98.8°F | Ht 70.0 in | Wt 238.0 lb

## 2016-08-17 DIAGNOSIS — R140 Abdominal distension (gaseous): Secondary | ICD-10-CM

## 2016-08-17 DIAGNOSIS — K219 Gastro-esophageal reflux disease without esophagitis: Secondary | ICD-10-CM

## 2016-08-17 DIAGNOSIS — I10 Essential (primary) hypertension: Secondary | ICD-10-CM

## 2016-08-17 DIAGNOSIS — F1010 Alcohol abuse, uncomplicated: Secondary | ICD-10-CM

## 2016-08-17 MED ORDER — CARVEDILOL 12.5 MG PO TABS: 12.5000 mg | ORAL_TABLET | Freq: Two times a day (BID) | ORAL | 1 refills | Status: DC

## 2016-08-17 MED ORDER — ESOMEPRAZOLE MAGNESIUM 40 MG PO CPDR
40.0000 mg | DELAYED_RELEASE_CAPSULE | Freq: Every day | ORAL | 2 refills | Status: DC
Start: 2016-08-17 — End: 2018-01-06

## 2016-08-17 MED FILL — ?ESOMEPRAZOLE MAG DR 40 MG: 40 MG | 30 days supply | Qty: 30 | Fill #0

## 2016-08-17 MED FILL — CARVEDILOL 12.5 MG TABLET: 12.5 | 30 days supply | Qty: 60 | Fill #0

## 2016-08-17 NOTE — Patient Instructions (Signed)
Alcohol Abuse and Nutrition °Alcohol abuse is any pattern of alcohol consumption that harms your health, relationships, or work. Alcohol abuse can affect how your body breaks down and absorbs nutrients from food by causing your liver to work abnormally. Additionally, many people who abuse alcohol do not eat enough carbohydrates, protein, fat, vitamins, and minerals. This can cause poor nutrition (malnutrition) and a lack of nutrients (nutrient deficiencies), which can lead to further complications. °Nutrients that are commonly lacking (deficient) among people who abuse alcohol include: °· Vitamins. °¨ Vitamin A. This is stored in your liver. It is important for your vision, metabolism, and ability to fight off infections (immunity). °¨ B vitamins. These include vitamins such as folate, thiamin, and niacin. These are important in new cell growth and maintenance. °¨ Vitamin C. This plays an important role in iron absorption, wound healing, and immunity. °¨ Vitamin D. This is produced by your liver, but you can also get vitamin D from food. Vitamin D is necessary for your body to absorb and use calcium. °· Minerals. °¨ Calcium. This is important for your bones and your heart and blood vessel (cardiovascular) function. °¨ Iron. This is important for blood, muscle, and nervous system functioning. °¨ Magnesium. This plays an important role in muscle and nerve function, and it helps to control blood sugar and blood pressure. °¨ Zinc. This is important for the normal function of your nervous system and digestive system (gastrointestinal tract). °Nutrition is an essential component of therapy for alcohol abuse. Your health care provider or dietitian will work with you to design a plan that can help restore nutrients to your body and prevent potential complications. °What is my plan? °Your dietitian may develop a specific diet plan that is based on your condition and any other complications you may have. A diet plan will  commonly include: °· A balanced diet. °¨ Grains: 6-8 oz per day. °¨ Vegetables: 2-3 cups per day. °¨ Fruits: 1-2 cups per day. °¨ Meat and other protein: 5-6 oz per day. °¨ Dairy: 2-3 cups per day. °· Vitamin and mineral supplements. °What do I need to know about alcohol and nutrition? °· Consume foods that are high in antioxidants, such as grapes, berries, nuts, green tea, and dark green and orange vegetables. This can help to counteract some of the stress that is placed on your liver by consuming alcohol. °· Avoid food and drinks that are high in fat and sugar. Foods such as sugared soft drinks, salty snack foods, and candy contain empty calories. This means that they lack important nutrients such as protein, fiber, and vitamins. °· Eat frequent meals and snacks. Try to eat 5-6 small meals each day. °· Eat a variety of fresh fruits and vegetables each day. This will help you get plenty of water, fiber, and vitamins in your diet. °· Drink plenty of water and other clear fluids. Try to drink at least 48-64 oz (1.5-2 L) of water per day. °· If you are a vegetarian, eat a variety of protein-rich foods. Pair whole grains with plant-based proteins at meals and snacks to obtain the greatest nutrient benefit from your food. For example, eat rice with beans, put peanut butter on whole-grain toast, or eat oatmeal with sunflower seeds. °· Soak beans and whole grains overnight before cooking. This can help your body to absorb the nutrients more easily. °· Include foods fortified with vitamins and minerals in your diet. Commonly fortified foods include milk, orange juice, cereal, and bread. °· If you   are malnourished, your dietitian may recommend a high-protein, high-calorie diet. This may include: °¨ 2,000-3,000 calories (kilocalories) per day. °¨ 70-100 grams of protein per day. °· Your health care provider may recommend a complete nutritional supplement beverage. This can help to restore calories, protein, and vitamins to  your body. Depending on your condition, you may be advised to consume this instead of or in addition to meals. °· Limit your intake of caffeine. Replace drinks like coffee and black tea with decaffeinated coffee and herbal tea. °· Eat a variety of foods that are high in omega fatty acids. These include fish, nuts and seeds, and soybeans. These foods may help your liver to recover and may also stabilize your mood. °· Certain medicines may cause changes in your appetite, taste, and weight. Work with your health care provider and dietitian to make any adjustments to your medicines and diet plan. °· Include other healthy lifestyle choices in your daily routine. °¨ Be physically active. °¨ Get enough sleep. °¨ Spend time doing activities that you enjoy. °· If you are unable to take in enough food and calories by mouth, your health care provider may recommend a feeding tube. This is a tube that passes through your nose and throat, directly into your stomach. Nutritional supplement beverages can be given to you through the feeding tube to help you get the nutrients you need. °· Take vitamin or mineral supplements as recommended by your health care provider. °What foods can I eat? °Grains  °Enriched pasta. Enriched rice. Fortified whole-grain bread. Fortified whole-grain cereal. Barley. Brown rice. Quinoa. Millet. °Vegetables  °All fresh, frozen, and canned vegetables. Spinach. Kale. Artichoke. Carrots. Winter squash and pumpkin. Sweet potatoes. Broccoli. Cabbage. Cucumbers. Tomatoes. Sweet peppers. Green beans. Peas. Corn. °Fruits  °All fresh and frozen fruits. Berries. Grapes. Mango. Papaya. Guava. Cherries. Apples. Bananas. Peaches. Plums. Pineapple. Watermelon. Cantaloupe. Oranges. Avocado. °Meats and Other Protein Sources  °Beef liver. Lean beef. Pork. Fresh and canned chicken. Fresh fish. Oysters. Sardines. Canned tuna. Shrimp. Eggs with yolks. Nuts and seeds. Peanut butter. Beans and lentils. Soybeans. Tofu. °Dairy    °Whole, low-fat, and nonfat milk. Whole, low-fat, and nonfat yogurt. Cottage cheese. Sour cream. Hard and soft cheeses. °Beverages  °Water. Herbal tea. Decaffeinated coffee. Decaffeinated green tea. 100% fruit juice. 100% vegetable juice. Instant breakfast shakes. °Condiments  °Ketchup. Mayonnaise. Mustard. Salad dressing. Barbecue sauce. °Sweets and Desserts  °Sugar-free ice cream. Sugar-free pudding. Sugar-free gelatin. °Fats and Oils  °Butter. Vegetable oil, flaxseed oil, olive oil, and walnut oil. °Other  °Complete nutrition shakes. Protein bars. Sugar-free gum. °The items listed above may not be a complete list of recommended foods or beverages. Contact your dietitian for more options.  °What foods are not recommended? °Grains  °Sugar-sweetened breakfast cereals. Flavored instant oatmeal. Fried breads. °Vegetables  °Breaded or deep-fried vegetables. °Fruits  °Dried fruit with added sugar. Candied fruit. Canned fruit in syrup. °Meats and Other Protein Sources  °Breaded or deep-fried meats. °Dairy  °Flavored milks. Fried cheese curds or fried cheese sticks. °Beverages  °Alcohol. Sugar-sweetened soft drinks. Sugar-sweetened tea. Caffeinated coffee and tea. °Condiments  °Sugar. Honey. Agave nectar. Molasses. °Sweets and Desserts  °Chocolate. Cake. Cookies. Candy. °Other  °Potato chips. Pretzels. Salted nuts. Candied nuts. °The items listed above may not be a complete list of foods and beverages to avoid. Contact your dietitian for more information.  °This information is not intended to replace advice given to you by your health care provider. Make sure you discuss any questions you   have with your health care provider. °Document Released: 02/15/2005 Document Revised: 08/31/2015 Document Reviewed: 11/24/2013 °Elsevier Interactive Patient Education © 2017 [employer] ° °

## 2016-08-17 NOTE — Progress Notes (Signed)
Subjective:  Patient ID: [name], male    DOB: February 18, 1961  Age: 56 y.o. MRN: [id number]  CC: alcoholism  HPI [name] is a 56 y.o. male with a PMH of Alcoholism, HTN, pancreatitis, renal stones, GERD, and anxiety presents to f/u on alcoholism. Was prescribed Topiramate at the last visit. Pt started on Topiramate 25 mg but does not take every day. Says "no special reason" for not taking daily. Says he drank "a couple of beers" two days ago while playing cards with a family member. Has not drank hard liquor. Reports that he has also cut down on fatty meals and plans to exercise more often to get his mind off drinking. Feels that he can quit drinking because he has done so before.     Review of Systems  Constitutional: Negative for chills, fever and malaise/fatigue.  Eyes: Negative for blurred vision.  Respiratory: Negative for shortness of breath.   Cardiovascular: Negative for chest pain and palpitations.  Gastrointestinal: Positive for heartburn. Negative for abdominal pain, blood in stool, constipation, diarrhea, melena, nausea and vomiting.       Acid reflux  Genitourinary: Negative for dysuria and hematuria.  Musculoskeletal: Negative for joint pain and myalgias.  Skin: Negative for rash.  Neurological: Negative for tingling and headaches.  Psychiatric/Behavioral: Negative for depression. The patient is not nervous/anxious.     Objective:  BP (!) 159/102   Pulse 92   Temp 98.8 F (37.1 C) (Oral)   Ht 5\' 10"  (1.778 m)   Wt 238 lb (108 kg)   SpO2 95%   BMI 34.15 kg/m   BP/Weight 08/17/2016 07/10/2016 09/22/8414  Systolic BP 606 301 601  Diastolic BP 093 84 235  Wt. (Lbs) 238 244.6 236.5  BMI 34.15 35.1 32.07      Physical Exam  Constitutional: He is oriented to person, place, and time.  Well developed, obese, NAD, polite  HENT:  Head: Normocephalic and atraumatic.  Eyes: No scleral icterus.  Neck: Normal range of motion.  Cardiovascular: Normal rate, regular  rhythm and normal heart sounds.   Pulmonary/Chest: Effort normal and breath sounds normal.  Abdominal: Soft. Bowel sounds are normal. There is no tenderness.  Musculoskeletal: He exhibits no edema.  Neurological: He is alert and oriented to person, place, and time. No cranial nerve deficit. Coordination normal.  Skin: Skin is warm and dry. No rash noted. No erythema. No pallor.  Psychiatric: He has a normal mood and affect. His behavior is normal. Thought content normal.  Vitals reviewed.    Assessment & Plan:   1. Alcohol abuse - Advised to take Topiramate as directed.  - Patient declines psychological counseling at this time - Patient seems to be between the preparation and action stage of the Five Stages of Change. We will continue to f/u and communicate to help him into the maintenance change.  2. Essential hypertension - carvedilol (COREG) 12.5 MG tablet; Take 1 tablet (12.5 mg total) by mouth 2 (two) times daily with a meal.  Dispense: 180 tablet; Refill: 1  3. Abdominal bloating - likely attributed to GERD vs alcoholism - Last CMP on 07/10/16 with only mild elevation of ALT.  4. Gastroesophageal reflux disease, esophagitis presence not specified - esomeprazole (NEXIUM) 40 MG capsule; Take 1 capsule (40 mg total) by mouth daily.  Dispense: 30 capsule; Refill: 2 - H. pylori antibody, IgG   Meds ordered this encounter  Medications  . esomeprazole (NEXIUM) 40 MG capsule    Sig: Take  1 capsule (40 mg total) by mouth daily.    Dispense:  30 capsule    Refill:  2    Order Specific Question:   Supervising Provider    Answer:   Tresa Garter [id number]  . carvedilol (COREG) 12.5 MG tablet    Sig: Take 1 tablet (12.5 mg total) by mouth 2 (two) times daily with a meal.    Dispense:  180 tablet    Refill:  1    Order Specific Question:   Supervising Provider    Answer:   Tresa Garter [id number]    Follow-up: Return in about 4 weeks (around 09/14/2016) for HTN and  f/u alcohol.   Clent Demark PA

## 2016-08-17 NOTE — Progress Notes (Signed)
F/U for hypertension  Denies pain today  Pt complains of ear pain that may be causing headaches, states when he touches his ear it feels full of water

## 2016-08-21 LAB — H. PYLORI ANTIBODY, IGG: H. pylori, IgG AbS: <0.8 Index Value (ref 0.00–0.79)

## 2016-08-22 ENCOUNTER — Telehealth (INDEPENDENT_AMBULATORY_CARE_PROVIDER_SITE_OTHER): Payer: Self-pay | Admitting: Physician Assistant

## 2016-08-22 NOTE — Telephone Encounter (Signed)
Patient left 2 messages on 08/20/2016 and 08/21/2016 stated needs to know which blood pressure medicine to take and when and for how long.  Please call back he did not leave phone number on messages.

## 2016-08-24 NOTE — Telephone Encounter (Signed)
Fwd to pcp. Tempestt S Roberts, CMA   

## 2016-08-27 NOTE — Telephone Encounter (Signed)
He was reportedly on Hyzaar and Norvasc when I saw him. I have added Coreg. Please make sure patient calls [place] to verify he has these drugs. We may have to get him to return for a dedicated HTN visit. Monitor BP and bring BP log to [place].

## 2016-08-28 ENCOUNTER — Telehealth (INDEPENDENT_AMBULATORY_CARE_PROVIDER_SITE_OTHER): Payer: Self-pay | Admitting: Physician Assistant

## 2016-08-28 NOTE — Telephone Encounter (Signed)
Attempted to call patient, no answer on either number on file will back later. Nat Christen, CMA

## 2016-08-28 NOTE — Telephone Encounter (Signed)
Patient left voicmail stated someone from [employer] called him to call him back at home number or cell number [phone]

## 2016-08-28 NOTE — Telephone Encounter (Signed)
Spoke to patient and informed him of the medications he needs to be taking and to bring medications and log to next appt. [name], CMA

## 2016-09-06 MED FILL — LOSARTAN-HCTZ 100-25 MG TAB: 100-25 | 30 days supply | Qty: 30 | Fill #1

## 2016-09-06 MED FILL — OMEPRAZOLE 20 MG CAP: 20 | 30 days supply | Qty: 30 | Fill #1

## 2016-09-06 MED FILL — AMLODIPINE BESYLATE 10 MG T: 10 | 30 days supply | Qty: 30 | Fill #1

## 2016-09-07 ENCOUNTER — Telehealth (INDEPENDENT_AMBULATORY_CARE_PROVIDER_SITE_OTHER): Payer: Self-pay | Admitting: Physician Assistant

## 2016-09-07 NOTE — Telephone Encounter (Signed)
Patient left voice mail stated that he called [facility] pharm and was told him medication can not be filled today.  Patient would like nurse or Domenica Fail PA to call pharm and request for Rx to filled today.   Patient stated out of medication only has 1 pill left.

## 2016-09-10 NOTE — Telephone Encounter (Signed)
Spoke with patient to confirm that he received prescription refills, patient stated that he did get the medications. Nat Christen, CMA

## 2016-09-12 ENCOUNTER — Ambulatory Visit (INDEPENDENT_AMBULATORY_CARE_PROVIDER_SITE_OTHER): Payer: Self-pay | Admitting: Physician Assistant

## 2016-09-12 ENCOUNTER — Encounter (INDEPENDENT_AMBULATORY_CARE_PROVIDER_SITE_OTHER): Payer: Self-pay | Admitting: Physician Assistant

## 2016-09-12 VITALS — BP 135/87 | HR 102 | Temp 100.1°F | Wt 236.2 lb

## 2016-09-12 DIAGNOSIS — H6123 Impacted cerumen, bilateral: Secondary | ICD-10-CM

## 2016-09-12 DIAGNOSIS — J302 Other seasonal allergic rhinitis: Secondary | ICD-10-CM

## 2016-09-12 DIAGNOSIS — I10 Essential (primary) hypertension: Secondary | ICD-10-CM

## 2016-09-12 DIAGNOSIS — H9202 Otalgia, left ear: Secondary | ICD-10-CM

## 2016-09-12 MED ORDER — PREDNISONE 20 MG PO TABS: 40.0000 mg | ORAL_TABLET | Freq: Every day | ORAL | 0 refills | Status: DC

## 2016-09-12 MED ORDER — CARBAMIDE PEROXIDE 6.5 % OT SOLN: 5.0000 [drp] | Freq: Two times a day (BID) | OTIC | 0 refills | Status: DC

## 2016-09-12 MED ORDER — CETIRIZINE HCL 10 MG PO TABS: 10.0000 mg | ORAL_TABLET | Freq: Every day | ORAL | 0 refills | Status: DC

## 2016-09-12 NOTE — Progress Notes (Signed)
Subjective:  Patient ID: [name], male    DOB: 1960-10-24  Age: 56 y.o. MRN: [id number]  CC: fluid in ear  HPI [name] is a 56 y.o. male with a PMH of alcohol abuse, anxiety, GERD, HTN, and renal stones presents with "fluid" in left ear. Feels as if the ear "gushes" when he manipulates auricle. Has also noticed clearing of throat throughout the day. Eyes are also "scratchy".     In regards to alcohol abuse. Has been drinking much less, "maybe one glass of wine" per week. Takes Topiramate as directed.     BP is also well controlled. Patient feels well. Denies CP, palpitations, SOB, HA, abdominal pain, f/c/n/v, rash, or GI/GU sxs.      ROS Review of Systems  Constitutional: Negative for chills, fever and malaise/fatigue.  HENT: Positive for ear pain (discomfort in left ear).   Eyes: Negative for blurred vision.  Respiratory: Negative for shortness of breath.   Cardiovascular: Negative for chest pain and palpitations.  Gastrointestinal: Negative for abdominal pain and nausea.  Genitourinary: Negative for dysuria and hematuria.  Musculoskeletal: Negative for joint pain and myalgias.  Skin: Negative for rash.  Neurological: Negative for tingling and headaches.  Psychiatric/Behavioral: Negative for depression. The patient is not nervous/anxious.     Objective:  BP 135/87 (BP Location: Left Arm, Patient Position: Sitting, Cuff Size: Large)   Pulse (!) 102   Temp 100.1 F (37.8 C) (Oral)   Wt 236 lb 3.2 oz (107.1 kg)   SpO2 99%   BMI 33.89 kg/m   BP/Weight 09/12/2016 4/81/8563 05/10/9700  Systolic BP 637 858 850  Diastolic BP 87 277 84  Wt. (Lbs) 236.2 238 244.6  BMI 33.89 34.15 35.1      Physical Exam  Constitutional: He is oriented to person, place, and time.  Well developed, overweight, NAD, polite  HENT:  Head: Normocephalic and atraumatic.  Bilateral cerumen impaction. TM normal bilaterally after ear lavage. Turbinates mildly hypertrophic and pale. Mild  postnasal drip. No sinus TTP.  Eyes: No scleral icterus.  Cardiovascular: Normal rate, regular rhythm and normal heart sounds.   Pulmonary/Chest: Effort normal and breath sounds normal.  Musculoskeletal: He exhibits no edema.  Lymphadenopathy:    He has no cervical adenopathy.  Neurological: He is alert and oriented to person, place, and time.  Skin: Skin is warm and dry. No rash noted. No erythema. No pallor.  Psychiatric: He has a normal mood and affect. His behavior is normal. Thought content normal.  Vitals reviewed.    Assessment & Plan:   1. Discomfort of left ear - Begin predniSONE (DELTASONE) 20 MG tablet; Take 2 tablets (40 mg total) by mouth daily with breakfast.  Dispense: 14 tablet; Refill: 0  2. Bilateral impacted cerumen - Begin carbamide peroxide (DEBROX) 6.5 % otic solution; Place 5 drops into both ears 2 (two) times daily.  Dispense: 15 mL; Refill: 0 - Ear Lavage completed. TMs and ear canal normal.  3. Seasonal allergic rhinitis, unspecified trigger - Begin predniSONE (DELTASONE) 20 MG tablet; Take 2 tablets (40 mg total) by mouth daily with breakfast.  Dispense: 14 tablet; Refill: 0 - cetirizine (ZYRTEC) 10 MG tablet; Take 1 tablet (10 mg total) by mouth daily.  Dispense: 30 tablet; Refill: 0  4. Essential hypertension - Well controlled on Hyzaar 110-25 mg, Amlodipine 10 mg, and Carvd   Meds ordered this encounter  Medications  . predniSONE (DELTASONE) 20 MG tablet    Sig: Take 2 tablets (  40 mg total) by mouth daily with breakfast.    Dispense:  14 tablet    Refill:  0    Order Specific Question:   Supervising Provider    Answer:   Tresa Garter [id number]  . carbamide peroxide (DEBROX) 6.5 % otic solution    Sig: Place 5 drops into both ears 2 (two) times daily.    Dispense:  15 mL    Refill:  0    Order Specific Question:   Supervising Provider    Answer:   Tresa Garter [id number]  . cetirizine (ZYRTEC) 10 MG tablet    Sig: Take 1 tablet  (10 mg total) by mouth daily.    Dispense:  30 tablet    Refill:  0    Order Specific Question:   Supervising Provider    Answer:   Tresa Garter [id number]    Follow-up: No Follow-up on file.   Clent Demark PA

## 2016-09-12 NOTE — Patient Instructions (Signed)
Earwax Buildup Your ears make a substance called earwax. It may also be called cerumen. Sometimes, too much earwax builds up in your ear canal. This can cause ear pain and make it harder for you to hear. CAUSES This condition is caused by too much earwax production or buildup. RISK FACTORS The following factors may make you more likely to develop this condition:  Cleaning your ears often with swabs.  Having narrow ear canals.  Having earwax that is overly thick or sticky.  Having eczema.  Being dehydrated. SYMPTOMS Symptoms of this condition include:  Reduced hearing.  Ear drainage.  Ear pain.  Ear itch.  A feeling of fullness in the ear or feeling that the ear is plugged.  Ringing in the ear.  Coughing. DIAGNOSIS Your health care provider can diagnose this condition based on your symptoms and medical history. Your health care provider will also do an ear exam to look inside your ear with a scope (otoscope). You may also have a hearing test. TREATMENT Treatment for this condition includes:  Over-the-counter or prescription ear drops to soften the earwax.  Earwax removal by a health care provider. This may be done:  By flushing the ear with body-temperature water.  With a medical instrument that has a loop at the end (earwax curette).  With a suction device. HOME CARE INSTRUCTIONS  Take over-the-counter and prescription medicines only as told by your health care provider.  Do not put any objects, including an ear swab, into your ear. You can clean the opening of your ear canal with a washcloth.  Drink enough water to keep your urine clear or pale yellow.  If you have frequent earwax buildup or you use hearing aids, consider seeing your health care provider every 6-12 months for routine preventive ear cleanings. Keep all follow-up visits as told by your health care provider. SEEK MEDICAL CARE IF:  You have ear pain.  Your condition does not improve with  treatment.  You have hearing loss.  You have blood, pus, or other fluid coming from your ear. This information is not intended to replace advice given to you by your health care provider. Make sure you discuss any questions you have with your health care provider. Document Released: 05/31/2004 Document Revised: 08/15/2015 Document Reviewed: 12/08/2014 Elsevier Interactive Patient Education  2017 [employer].

## 2016-09-14 ENCOUNTER — Ambulatory Visit (INDEPENDENT_AMBULATORY_CARE_PROVIDER_SITE_OTHER): Payer: Self-pay | Admitting: Physician Assistant

## 2016-10-02 MED FILL — LOSARTAN-HCTZ 100-25 MG TAB: 100-25 | 30 days supply | Qty: 30 | Fill #2

## 2016-10-02 MED FILL — ?AMLODIPINE BESYLATE 10 MG: 10 | 30 days supply | Qty: 30 | Fill #2

## 2016-10-02 MED FILL — ?OMEPRAZOLE DR 20 MG CAPSUL: 20 | 30 days supply | Qty: 30 | Fill #2

## 2016-10-05 ENCOUNTER — Encounter (HOSPITAL_COMMUNITY): Payer: Self-pay | Admitting: Emergency Medicine

## 2016-10-05 DIAGNOSIS — I10 Essential (primary) hypertension: Secondary | ICD-10-CM | POA: Insufficient documentation

## 2016-10-05 DIAGNOSIS — N342 Other urethritis: Secondary | ICD-10-CM | POA: Insufficient documentation

## 2016-10-05 DIAGNOSIS — N23 Unspecified renal colic: Secondary | ICD-10-CM | POA: Insufficient documentation

## 2016-10-05 DIAGNOSIS — Z87891 Personal history of nicotine dependence: Secondary | ICD-10-CM | POA: Insufficient documentation

## 2016-10-05 LAB — URINALYSIS, ROUTINE W REFLEX MICROSCOPIC
BILIRUBIN URINE: NEGATIVE
Glucose, UA: NEGATIVE mg/dL
Ketones, ur: NEGATIVE mg/dL
Nitrite: NEGATIVE
Protein, ur: 30 mg/dL — AB
Specific Gravity, Urine: 1.015 (ref 1.005–1.030)
Squamous Epithelial / LPF: NONE SEEN
pH: 5 (ref 5.0–8.0)

## 2016-10-05 LAB — CBC
HEMATOCRIT: 37.6 % — AB (ref 39.0–52.0)
Hemoglobin: 12.7 g/dL — ABNORMAL LOW (ref 13.0–17.0)
MCH: 30.5 pg (ref 26.0–34.0)
MCHC: 33.8 g/dL (ref 30.0–36.0)
MCV: 90.2 fL (ref 78.0–100.0)
Platelets: 250 10*3/uL (ref 150–400)
RBC: 4.17 MIL/uL — ABNORMAL LOW (ref 4.22–5.81)
RDW: 12.7 % (ref 11.5–15.5)
WBC: 16.6 10*3/uL — AB (ref 4.0–10.5)

## 2016-10-05 LAB — COMPREHENSIVE METABOLIC PANEL
ALBUMIN: 4.3 g/dL (ref 3.5–5.0)
ALT: 24 U/L (ref 17–63)
AST: 28 U/L (ref 15–41)
Alkaline Phosphatase: 30 U/L — ABNORMAL LOW (ref 38–126)
Anion gap: 13 (ref 5–15)
BUN: 17 mg/dL (ref 6–20)
CO2: 22 mmol/L (ref 22–32)
Calcium: 9.1 mg/dL (ref 8.9–10.3)
Chloride: 100 mmol/L — ABNORMAL LOW (ref 101–111)
Creatinine, Ser: 1.48 mg/dL — ABNORMAL HIGH (ref 0.61–1.24)
GFR calc Af Amer: >60 mL/min (ref >60)
GFR calc non Af Amer: 52 mL/min — ABNORMAL LOW (ref >60)
Glucose, Bld: 127 mg/dL — ABNORMAL HIGH (ref 65–99)
POTASSIUM: 3.2 mmol/L — AB (ref 3.5–5.1)
SODIUM: 135 mmol/L (ref 135–145)
Total Bilirubin: 0.9 mg/dL (ref 0.3–1.2)
Total Protein: 7.5 g/dL (ref 6.5–8.1)

## 2016-10-05 LAB — LIPASE, BLOOD: LIPASE: 20 U/L (ref 11–51)

## 2016-10-05 MED ORDER — ONDANSETRON 4 MG PO TBDP
ORAL_TABLET | ORAL | Status: AC
  Filled 2016-10-05: qty 1

## 2016-10-05 NOTE — ED Notes (Signed)
Zofran given for nausea, per request of pt

## 2016-10-05 NOTE — ED Triage Notes (Signed)
Pt reports LLQ pain with radiation to L flank area. Sharp in nature. Reports N/V and constipation. Pain initially intermittent, now constant. Hx pancreatitis.

## 2016-10-06 ENCOUNTER — Emergency Department (HOSPITAL_COMMUNITY)
Admission: EM | Admit: 2016-10-06 | Discharge: 2016-10-06 | Disposition: A | Discharge status: Home / Self Care | Payer: Self-pay | Attending: Emergency Medicine | Admitting: Emergency Medicine

## 2016-10-06 ENCOUNTER — Emergency Department (HOSPITAL_COMMUNITY): Payer: Self-pay

## 2016-10-06 DIAGNOSIS — N23 Unspecified renal colic: Secondary | ICD-10-CM

## 2016-10-06 DIAGNOSIS — N342 Other urethritis: Secondary | ICD-10-CM

## 2016-10-06 MED ORDER — OXYCODONE-ACETAMINOPHEN 5-325 MG PO TABS: 1.0000 | ORAL_TABLET | ORAL | 0 refills | Status: DC | PRN

## 2016-10-06 MED ORDER — CEPHALEXIN 500 MG PO CAPS: 500.0000 mg | ORAL_CAPSULE | Freq: Three times a day (TID) | ORAL | 0 refills | Status: AC

## 2016-10-06 MED ORDER — MORPHINE SULFATE (PF) 4 MG/ML IV SOLN
4.0000 mg | Freq: Once | INTRAVENOUS | Status: AC
  Administered 2016-10-06: 4 mg via INTRAVENOUS
  Filled 2016-10-06: qty 1

## 2016-10-06 MED ORDER — KETOROLAC TROMETHAMINE 30 MG/ML IJ SOLN
30.0000 mg | Freq: Once | INTRAMUSCULAR | Status: AC
  Administered 2016-10-06: 30 mg via INTRAVENOUS
  Filled 2016-10-06: qty 1

## 2016-10-06 MED ORDER — CEPHALEXIN 250 MG PO CAPS
500.0000 mg | ORAL_CAPSULE | Freq: Once | ORAL | Status: AC
  Administered 2016-10-06: 500 mg via ORAL
  Filled 2016-10-06: qty 2

## 2016-10-06 MED ORDER — SODIUM CHLORIDE 0.9 % IV BOLUS (SEPSIS)
1000.0000 mL | Freq: Once | INTRAVENOUS | Status: AC
  Administered 2016-10-06: 1000 mL via INTRAVENOUS

## 2016-10-06 MED ORDER — ONDANSETRON HCL 4 MG/2ML IJ SOLN
4.0000 mg | Freq: Once | INTRAMUSCULAR | Status: AC
  Administered 2016-10-06: 4 mg via INTRAVENOUS
  Filled 2016-10-06: qty 2

## 2016-10-06 MED ORDER — TAMSULOSIN HCL 0.4 MG PO CAPS
0.4000 mg | ORAL_CAPSULE | Freq: Every day | ORAL | 0 refills | Status: DC
Start: 2016-10-06 — End: 2017-07-02

## 2016-10-06 MED ORDER — SODIUM CHLORIDE 0.9 % IV BOLUS (SEPSIS): 1000.0000 mL | Freq: Once | INTRAVENOUS | Status: DC

## 2016-10-06 NOTE — ED Notes (Signed)
Patient transported to CT 

## 2016-10-06 NOTE — Discharge Instructions (Signed)
Take motrin for pain.   Take percocet for severe pain. Do not drive with it  Take flomax daily   Take keflex for possible UTI,. \  Call urology office on Monday for appointment. You have 7 mm stone that may not pass and may require a procedure  Return to ER if you have worse abdominal pain, vomiting, fevers, unable to urinate

## 2016-10-06 NOTE — ED Provider Notes (Signed)
[place] DEPT Provider Note   CSN: [id number] Arrival date & time: 10/05/16  2112   By signing my name below, I, [name], attest that this documentation has been prepared under the direction and in the presence of Drenda Freeze, MD. Electronically Signed: Soijett Fowler, ED Scribe. 10/06/16. 1:47 AM.  History   Chief Complaint Chief Complaint  Patient presents with  . Abdominal Pain    HPI [name] is a 56 y.o. male with a PMHx of HTN, who presents to the Emergency Department complaining of 10/10, constant, sharp, LLQ abdominal pain onset 4 PM yesterday. Pt reports associated nausea, hematuria, and left flank pain. Pt has not tried any medications for the relief of his symptoms. He notes that his LLQ abdominal pain currently radiates to his left flank. Pt states that he has had similar symptoms when he had kidney stones in the past, where he had an ureteroscopy and stone removal completed. Denies fever, vomiting, and any other symptoms.   The history is provided by the patient. No language interpreter was used.    Past Medical History:  Diagnosis Date  . Alcohol abuse   . Anxiety   . Arthritis   . Diverticulosis of colon (without mention of hemorrhage)   . GERD (gastroesophageal reflux disease)   . Hyperlipemia   . Hypertension    dr Ronalee Belts  norins  . Kidney stones   . Nephrolithiasis   . Pancreatitis    Hx of   . 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 Non-compliant behavior 12/02/2013  . Routine health maintenance 01/27/2012  . Hypertriglyceridemia 01/27/2012  . Knee pain, left 11/12/2011  . BPH (benign prostatic hyperplasia) 11/12/2011  . GERD 08/10/2008  . Essential hypertension 04/14/2008  . Alcohol abuse 10/26/2007  . TRIGGER FINGER, LEFT MIDDLE 05/12/2007  . NEPHROLITHIASIS, HX OF 05/12/2007    Past Surgical History:  Procedure Laterality Date  . A2 pulley flexor sheath cyst 4th  finger excisional biopsy     '03  . ANTERIOR CRUCIATE LIGAMENT REPAIR     R knee  . ESOPHAGOGASTRODUODENOSCOPY N/A 07/09/2012   Procedure: ESOPHAGOGASTRODUODENOSCOPY (EGD);  Surgeon: Inda Castle, MD;  Location: [facility];  Service: Endoscopy;  Laterality: N/A;  . KNEE SURGERY  2008  . SHOULDER ARTHROSCOPY WITH SUBACROMIAL DECOMPRESSION AND OPEN ROTATOR C Right 12/26/2012   Procedure: RIGHT SHOULDER ARTHROSCOPY WITH SUBACROMIAL DECOMPRESSION MINI OPEN ROTATOR CUFF REPAIR, OPEN BICEP TENODESIS OPEN DISTAL CLAVICLE RESECTION  ;  Surgeon: Augustin Schooling, MD;  Location: [place];  Service: Orthopedics;  Laterality: Right;  . URETEROSCOPY     and stone retreval       Home Medications      Family History Family History  Problem Relation Age of Onset  . Hypertension Father        X4 siblings  . Heart disease Father   . Diabetes Mother        x2 brothers    Social History Social History  Substance Use Topics  . Smoking status: Former Smoker    Types: Cigarettes    Quit date: 07/10/1998  . Smokeless tobacco: Never Used  . Alcohol use 0.0 oz/week     Comment: daily     Allergies   Shrimp [shellfish allergy] and Vicodin [hydrocodone-acetaminophen]   Review of Systems Review of Systems  Constitutional: Negative for fever.  Gastrointestinal: Positive for abdominal pain and nausea. Negative for vomiting.  Genitourinary: Positive for flank pain (left) and hematuria.  All other systems reviewed and are negative.    Physical Exam Updated Vital Signs BP (!) 125/56   Pulse 89   Temp 100 F (37.8 C) (Oral)   Resp 16   Ht 6' (1.829 m)   Wt 235 lb (106.6 kg)   SpO2 97%   BMI 31.87 kg/m   Physical Exam  Constitutional: He is oriented to person, place, and time. He appears well-developed and well-nourished. No distress.  HENT:  Head: Normocephalic and atraumatic.  Eyes: EOM are normal.  Neck: Neck supple.  Cardiovascular: Normal rate, regular rhythm and normal heart  sounds.  Exam reveals no gallop and no friction rub.   No murmur heard. Pulmonary/Chest: Effort normal and breath sounds normal. No respiratory distress. He has no wheezes. He has no rales.  Abdominal: Soft. He exhibits no distension. There is tenderness in the left lower quadrant. There is CVA tenderness.  LLQ tenderness. Mild left CVA tenderness.   Musculoskeletal: Normal range of motion.  Neurological: He is alert and oriented to person, place, and time.  Skin: Skin is warm and dry.  Psychiatric: He has a normal mood and affect. His behavior is normal.  Nursing note and vitals reviewed.    ED Treatments / Results  DIAGNOSTIC STUDIES: Oxygen Saturation is 97% on RA, nl by my interpretation.    COORDINATION OF CARE: 1:47 AM Discussed treatment plan with pt at bedside and pt agreed to plan.   Labs (all labs ordered are listed, but only abnormal results are displayed) Labs Reviewed  COMPREHENSIVE METABOLIC PANEL - Abnormal; Notable for the following:       Result Value   Potassium 3.2 (*)    Chloride 100 (*)    Glucose, Bld 127 (*)    Creatinine, Ser 1.48 (*)    Alkaline Phosphatase 30 (*)    GFR calc non Af Amer 52 (*)    All other components within normal limits  CBC - Abnormal; Notable for the following:    WBC 16.6 (*)    RBC 4.17 (*)    Hemoglobin 12.7 (*)    HCT 37.6 (*)    All other components within normal limits  URINALYSIS, ROUTINE W REFLEX MICROSCOPIC - Abnormal; Notable for the following:    APPearance HAZY (*)    Hgb urine dipstick LARGE (*)    Protein, ur 30 (*)    Leukocytes, UA TRACE (*)    Bacteria, UA RARE (*)    All other components within normal limits  URINE CULTURE  LIPASE, BLOOD    EKG  EKG Interpretation None       Radiology Ct Renal Stone Study  Result Date: 10/06/2016 CLINICAL DATA:  Acute onset of left lower quadrant abdominal pain, radiating to the left flank. Nausea, vomiting and constipation. Initial encounter. EXAM: CT ABDOMEN  AND PELVIS WITHOUT CONTRAST TECHNIQUE: Multidetector CT imaging of the abdomen and pelvis was performed following the standard protocol without IV contrast. COMPARISON:  CT of the abdomen and pelvis from 05/27/2014 FINDINGS: Lower chest: The visualized lung bases are grossly clear. The visualized portions of the mediastinum are unremarkable. Hepatobiliary: The liver is unremarkable in appearance. The gallbladder is unremarkable in appearance. The common bile duct remains normal in caliber. Pancreas: The pancreas is within normal limits. Spleen: The spleen is unremarkable in appearance. Adrenals/Urinary Tract: The adrenal glands are unremarkable in appearance. There is mild left-sided hydronephrosis, with left-sided perinephric stranding, and an obstructing large  7 x 6 mm stone at the proximal left ureter, just below the left renal pelvis. Nonobstructing bilateral renal stones measure up to 6 mm in size. Mild right-sided perinephric stranding is noted. Stomach/Bowel: The stomach is unremarkable in appearance. The small bowel is within normal limits. The appendix is normal in caliber, without evidence of appendicitis. Scattered diverticulosis is noted along the ascending and proximal transverse colon, without evidence of diverticulitis. Vascular/Lymphatic: Scattered calcification is seen along the abdominal aorta and its branches. The abdominal aorta is otherwise grossly unremarkable. The inferior vena cava is grossly unremarkable. No retroperitoneal lymphadenopathy is seen. No pelvic sidewall lymphadenopathy is identified. Reproductive: The thick-walled appearance of the bladder raises concern for cystitis. The prostate remains normal in size. Other: No additional soft tissue abnormalities are seen. Musculoskeletal: No acute osseous abnormalities are identified. Multilevel vacuum phenomenon is noted along the lower lumbar spine, with endplate degenerative change at L4-L5. The visualized musculature is unremarkable in  appearance. IMPRESSION: 1. Mild left-sided hydronephrosis, with an obstructing large 7 x 6 mm stone at the proximal left ureter, just below the left renal pelvis. 2. Thick-walled appearance of the bladder raises concern for cystitis. 3. Nonobstructing bilateral renal stones measure up to 6 mm in size. 4. Scattered diverticulosis along the ascending and proximal transverse colon, without diverticulitis. 5. Scattered aortic atherosclerosis. 6. Mild degenerative change at the lower lumbar spine. Electronically Signed   By: Garald Balding M.D.   On: 10/06/2016 02:01    Procedures Procedures (including critical care time)  Medications Ordered in ED Medications  ondansetron (ZOFRAN-ODT) 4 MG disintegrating tablet (not administered)  cephALEXin (KEFLEX) capsule 500 mg (not administered)  sodium chloride 0.9 % bolus 1,000 mL (0 mLs Intravenous Stopped 10/06/16 0314)  ondansetron (ZOFRAN) injection 4 mg (4 mg Intravenous Given 10/06/16 0152)  ketorolac (TORADOL) 30 MG/ML injection 30 mg (30 mg Intravenous Given 10/06/16 0152)  morphine 4 MG/ML injection 4 mg (4 mg Intravenous Given 10/06/16 0152)     Initial Impression / Assessment and Plan / ED Course  I have reviewed the triage vital signs and the nursing notes.  Pertinent labs & imaging results that were available during my care of the patient were reviewed by me and considered in my medical decision making (see chart for details).     MAHIN GUARDIA is a 56 y.o. male here with L flank pain, LLQ pain. Hx of kidney stone requiring procedure with similar symptoms. UA + RBC, will get CT renal stone, labs.   3:28 AM Labs showed Cr. 1.5, baseline 1. Given IVF. Pain controlled with pain meds. CT showed 7 mm proximal stone. Since pain controlled, will dc home with percocet, flomax. CT showed possible thick walled bladder. UA has some leuk but no impressive for UTI. Given presence of kidney stones, low grade temp, and CT findings, I think the benefits of abx  outweigh the risk so will give keflex. Will have him call urology in 2 days for appointment as he has a large size stone that may not pass. Given strict return precautions.    Final Clinical Impressions(s) / ED Diagnoses   Final diagnoses:  None    New Prescriptions New Prescriptions   No medications on file   I personally performed the services described in this documentation, which was scribed in my presence. The recorded information has been reviewed and is accurate.     Drenda Freeze, MD 10/06/16 [phone]

## 2016-10-06 NOTE — ED Notes (Signed)
Pt up at nurses station requesting update, informed that next room available pt would be assigned.

## 2016-10-07 LAB — URINE CULTURE: Culture: NO GROWTH

## 2016-10-08 ENCOUNTER — Telehealth (INDEPENDENT_AMBULATORY_CARE_PROVIDER_SITE_OTHER): Payer: Self-pay | Admitting: Physician Assistant

## 2016-10-08 ENCOUNTER — Other Ambulatory Visit (INDEPENDENT_AMBULATORY_CARE_PROVIDER_SITE_OTHER): Payer: Self-pay | Admitting: Physician Assistant

## 2016-10-08 DIAGNOSIS — N200 Calculus of kidney: Secondary | ICD-10-CM

## 2016-10-08 NOTE — Telephone Encounter (Signed)
Patient called back stated would like to speak to Domenica Fail PA today, informed he is with patient.  Patient declined appt for tomorrow stated only wants to speak to him, don't need to see him unless he is going to remove kidney stones. Patient stated only wants to know if Domenica Fail PA has any references.  Please follow up with patient.

## 2016-10-08 NOTE — Telephone Encounter (Signed)
Patient called left voicemail  stated would like to speak to nurse or PA today, having a lot of pain ,  And has already been to ER.  Please follow up with patient at [phone]

## 2016-10-08 NOTE — Telephone Encounter (Signed)
Patient left voicemail stated just wants to let Domenica Fail PA know he was seen at ER on Saturday, and was diagnosed with kidney stones. Wants to know what Domenica Fail PA recommends because thinks stone it to big for him to pass, and he is waiting on ER doctor to call him with an appt for a procedure but just wanted to let Domenica Fail PA.   Please follow up with patient.

## 2016-10-08 NOTE — Progress Notes (Signed)
Patient advised to go to ED. Urgent urology referral done today.

## 2016-10-08 NOTE — Telephone Encounter (Signed)
FWD to PCP. Makayla Confer S Sharief Wainwright, CMA  

## 2016-10-08 NOTE — Telephone Encounter (Signed)
I spoke to patient and advised that he return to the ED for evaluation. I advised that he could have permanent damage to the kidney if he has developed hydronephrosis 2/2 the 7cm renal stone found at his recent ED visit. I will also put in an urgent urology appointment.

## 2016-10-08 NOTE — Telephone Encounter (Signed)
FWD to PCP. Zonya Gudger S Pesach Frisch, CMA  

## 2016-10-10 MED FILL — POTASSIUM CITRATE ER 10 MEQ: 10 MEQ | 30 days supply | Qty: 120 | Fill #0

## 2016-10-10 MED FILL — ?CIPROFLOXACIN HCL 500MG TA: 500 | 7 days supply | Qty: 14 | Fill #0

## 2016-10-26 MED FILL — TAMSULOSIN HCL 0.4 MG CAP: 0.4 | 30 days supply | Qty: 30 | Fill #0

## 2016-11-02 ENCOUNTER — Encounter (INDEPENDENT_AMBULATORY_CARE_PROVIDER_SITE_OTHER): Payer: Self-pay

## 2016-11-05 MED FILL — ?AMLODIPINE BESYLATE 10 MG: 10 | 30 days supply | Qty: 30 | Fill #3

## 2016-11-05 MED FILL — TAMSULOSIN HCL 0.4 MG CAP: 0.4 | 30 days supply | Qty: 30 | Fill #1

## 2016-11-05 MED FILL — ?OMEPRAZOLE DR 20 MG CAPSUL: 20 | 30 days supply | Qty: 30 | Fill #3

## 2016-11-05 MED FILL — LOSARTAN-HCTZ 100-25 MG TAB: 100-25 | 30 days supply | Qty: 30 | Fill #3

## 2016-11-14 ENCOUNTER — Ambulatory Visit (INDEPENDENT_AMBULATORY_CARE_PROVIDER_SITE_OTHER): Payer: Self-pay | Admitting: Physician Assistant

## 2016-11-14 ENCOUNTER — Encounter (INDEPENDENT_AMBULATORY_CARE_PROVIDER_SITE_OTHER): Payer: Self-pay | Admitting: Physician Assistant

## 2016-11-14 VITALS — BP 129/86 | HR 88 | Temp 97.8°F | Wt 237.0 lb

## 2016-11-14 DIAGNOSIS — Z1159 Encounter for screening for other viral diseases: Secondary | ICD-10-CM

## 2016-11-14 DIAGNOSIS — Z114 Encounter for screening for human immunodeficiency virus [HIV]: Secondary | ICD-10-CM

## 2016-11-14 DIAGNOSIS — I10 Essential (primary) hypertension: Secondary | ICD-10-CM

## 2016-11-14 MED ORDER — CARVEDILOL 12.5 MG PO TABS: 12.5000 mg | ORAL_TABLET | Freq: Two times a day (BID) | ORAL | 3 refills | Status: DC

## 2016-11-14 MED ORDER — LOSARTAN POTASSIUM-HCTZ 100-25 MG PO TABS: 1.0000 | ORAL_TABLET | Freq: Every day | ORAL | 3 refills | Status: DC

## 2016-11-14 MED ORDER — AMLODIPINE BESYLATE 10 MG PO TABS: 10.0000 mg | ORAL_TABLET | Freq: Every day | ORAL | 3 refills | Status: DC

## 2016-11-14 NOTE — Patient Instructions (Signed)

## 2016-11-14 NOTE — Progress Notes (Signed)
Subjective:  Patient ID: [name], male    DOB: Jan 10, 1961  Age: 56 y.o. MRN: [id number]  CC: f/u HTN  HPI  [name] is a 56 y.o. male with a PMH of alcohol abuse, anxiety, GERD, HTN, and renal stones presents to f/u on HTN. Says he takes his anti-hypertensives daily as directed. No side effects. BP is 129/86 today in clinic which reportedly parallels his BP outside of clinic. Denies CP, palpitations, SOB, HA, tingling, numbness, presyncope, syncope, PND, orthopnea, abdominal pain, rash, f/c/n/v, or GI/GU sxs.     ROS Review of Systems  Constitutional: Negative for chills, fever and malaise/fatigue.  Eyes: Negative for blurred vision.  Respiratory: Negative for shortness of breath.   Cardiovascular: Negative for chest pain and palpitations.  Gastrointestinal: Negative for abdominal pain and nausea.  Genitourinary: Negative for dysuria and hematuria.  Musculoskeletal: Negative for joint pain and myalgias.  Skin: Negative for rash.  Neurological: Negative for tingling and headaches.  Psychiatric/Behavioral: Negative for depression. The patient is not nervous/anxious.     Objective:  BP 129/86 (BP Location: Left Arm, Patient Position: Sitting, Cuff Size: Large)   Pulse 88   Temp 97.8 F (36.6 C) (Oral)   Wt 237 lb (107.5 kg)   SpO2 97%   BMI 32.14 kg/m   BP/Weight 11/14/2016 08/08/3152 0/0/8676  Systolic BP 195 093 -  Diastolic BP 86 95 -  Wt. (Lbs) 237 - 235  BMI 32.14 31.87 -      Physical Exam  Constitutional: He is oriented to person, place, and time.  Well developed, overweight, NAD, polite  HENT:  Head: Normocephalic and atraumatic.  Eyes: No scleral icterus.  Neck: Normal range of motion. Neck supple. No thyromegaly present.  Cardiovascular: Normal rate, regular rhythm and normal heart sounds.   Pulmonary/Chest: Effort normal and breath sounds normal.  Musculoskeletal: He exhibits no edema.  Neurological: He is alert and oriented to person, place, and  time. No cranial nerve deficit. Coordination normal.  Skin: Skin is warm and dry. No rash noted. No erythema. No pallor.  Psychiatric: He has a normal mood and affect. His behavior is normal. Thought content normal.  Vitals reviewed.    Assessment & Plan:   1. Hypertension, unspecified type - refill carvedilol (COREG) 12.5 MG tablet; Take 1 tablet (12.5 mg total) by mouth 2 (two) times daily with a meal.  Dispense: 180 tablet; Refill: 3 - refill amLODipine (NORVASC) 10 MG tablet; Take 1 tablet (10 mg total) by mouth daily.  Dispense: 90 tablet; Refill: 3 - refill losartan-hydrochlorothiazide (HYZAAR) 100-25 MG tablet; Take 1 tablet by mouth daily.  Dispense: 90 tablet; Refill: 3  2. Need for hepatitis C screening test - Hepatitis c antibody (reflex)  3. Screening for HIV (human immunodeficiency virus) - HIV antibody     Meds ordered this encounter  Medications  . carvedilol (COREG) 12.5 MG tablet    Sig: Take 1 tablet (12.5 mg total) by mouth 2 (two) times daily with a meal.    Dispense:  180 tablet    Refill:  3    Order Specific Question:   Supervising Provider    Answer:   Tresa Garter [id number]  . amLODipine (NORVASC) 10 MG tablet    Sig: Take 1 tablet (10 mg total) by mouth daily.    Dispense:  90 tablet    Refill:  3    Please consider 90 day supplies to promote better adherence    Order Specific Question:  Supervising Provider    Answer:   Tresa Garter [id number]  . losartan-hydrochlorothiazide (HYZAAR) 100-25 MG tablet    Sig: Take 1 tablet by mouth daily.    Dispense:  90 tablet    Refill:  3    Please consider 90 day supplies to promote better adherence    Order Specific Question:   Supervising Provider    Answer:   Tresa Garter [[id number]]    Follow-up: Return if symptoms worsen or fail to improve, for full physical.   Clent Demark PA

## 2016-11-15 ENCOUNTER — Telehealth (INDEPENDENT_AMBULATORY_CARE_PROVIDER_SITE_OTHER): Payer: Self-pay

## 2016-11-15 LAB — HEPATITIS C ANTIBODY (REFLEX): HCV Ab: <0.1 s/co ratio (ref 0.0–0.9)

## 2016-11-15 LAB — HCV COMMENT:

## 2016-11-15 LAB — HIV ANTIBODY (ROUTINE TESTING W REFLEX): HIV Screen 4th Generation wRfx: NONREACTIVE

## 2016-11-15 NOTE — Progress Notes (Deleted)
Subjective:  Patient ID: [name], male    DOB: 01-02-1961  Age: 56 y.o. MRN: [id number]  CC: f/u HTN  HPI [name] is a 56 y.o. male with a PMH of alcohol abuse, anxiety, GERD, HTN, and renal stones presents to f/u on HTN. Says he takes his anti-hypertensives daily as directed. No side effects. BP is 129/86 today in clinic which reportedly parallels his BP outside of clinic. Denies CP, palpitations, SOB, HA, tingling, numbness, presyncope, syncope, PND, orthopnea, abdominal pain, rash, f/c/n/v, or GI/GU sxs.     Outpatient Medications Prior to Visit  Medication Sig Dispense Refill  . esomeprazole (NEXIUM) 40 MG capsule Take 1 capsule (40 mg total) by mouth daily. 30 capsule 2  . methocarbamol (ROBAXIN) 500 MG tablet Take 500 mg by mouth 4 (four) times daily. Muscle spasms    . Multiple Vitamins-Minerals (MULTIVITAMIN WITH MINERALS) tablet Take 1 tablet by mouth daily.    [name] oxyCODONE-acetaminophen (PERCOCET) 5-325 MG tablet Take 1-2 tablets by mouth every 4 (four) hours as needed. 15 tablet 0  . polycarbophil (FIBERCON) 625 MG tablet Take 625 mg by mouth daily.    . tamsulosin (FLOMAX) 0.4 MG CAPS capsule Take 1 capsule (0.4 mg total) by mouth daily. 15 capsule 0  . amLODipine (NORVASC) 10 MG tablet Take 1 tablet (10 mg total) by mouth daily. 90 tablet 3  . losartan-hydrochlorothiazide (HYZAAR) 100-25 MG tablet TAKE ONE TABLET BY MOUTH ONCE DAILY. 90 tablet 3  . carbamide peroxide (DEBROX) 6.5 % otic solution Place 5 drops into both ears 2 (two) times daily. 15 mL 0  . carvedilol (COREG) 12.5 MG tablet Take 1 tablet (12.5 mg total) by mouth 2 (two) times daily with a meal. (Patient not taking: Reported on 11/14/2016) 180 tablet 1  . cetirizine (ZYRTEC) 10 MG tablet Take 1 tablet (10 mg total) by mouth daily. (Patient not taking: Reported on 11/14/2016) 30 tablet 0  . losartan-hydrochlorothiazide (HYZAAR) 100-25 MG tablet TAKE ONE TABLET BY MOUTH ONCE DAILY. **NEED OFFICE VISIT FOR  FURTHER REFILLS*** (Patient not taking: Reported on 11/14/2016) 30 tablet 2  . predniSONE (DELTASONE) 20 MG tablet Take 2 tablets (40 mg total) by mouth daily with breakfast. (Patient not taking: Reported on 11/14/2016) 14 tablet 0  . topiramate (TOPAMAX) 25 MG tablet Take 1 tablet (25 mg total) by mouth 2 (two) times daily. (Patient not taking: Reported on 11/14/2016) 30 tablet 1   No facility-administered medications prior to visit.      ROS Review of Systems  Constitutional: Negative for chills, fever and malaise/fatigue.  Eyes: Negative for blurred vision.  Respiratory: Negative for shortness of breath.   Cardiovascular: Negative for chest pain and palpitations.  Gastrointestinal: Negative for abdominal pain and nausea.  Genitourinary: Negative for dysuria and hematuria.  Musculoskeletal: Negative for joint pain and myalgias.  Skin: Negative for rash.  Neurological: Negative for tingling and headaches.  Psychiatric/Behavioral: Negative for depression. The patient is not nervous/anxious.     Objective:  BP 129/86 (BP Location: Left Arm, Patient Position: Sitting, Cuff Size: Large)   Pulse 88   Temp 97.8 F (36.6 C) (Oral)   Wt 237 lb (107.5 kg)   SpO2 97%   BMI 32.14 kg/m   BP/Weight 11/14/2016 11/07/5327 01/07/4267  Systolic BP 341 962 -  Diastolic BP 86 95 -  Wt. (Lbs) 237 - 235  BMI 32.14 31.87 -      Physical Exam  Constitutional: He is oriented to person, place, and  time.  Well developed, overweight, NAD, polite  HENT:  Head: Normocephalic and atraumatic.  Eyes: No scleral icterus.  Neck: Normal range of motion. Neck supple. No thyromegaly present.  Cardiovascular: Normal rate, regular rhythm and normal heart sounds.   Pulmonary/Chest: Effort normal and breath sounds normal.  Musculoskeletal: He exhibits no edema.  Neurological: He is alert and oriented to person, place, and time. No cranial nerve deficit. Coordination normal.  Skin: Skin is warm and dry. No rash  noted. No erythema. No pallor.  Psychiatric: He has a normal mood and affect. His behavior is normal. Thought content normal.  Vitals reviewed.    Assessment & Plan:   1. Hypertension, unspecified type - refill carvedilol (COREG) 12.5 MG tablet; Take 1 tablet (12.5 mg total) by mouth 2 (two) times daily with a meal.  Dispense: 180 tablet; Refill: 3 - refill amLODipine (NORVASC) 10 MG tablet; Take 1 tablet (10 mg total) by mouth daily.  Dispense: 90 tablet; Refill: 3 - refill losartan-hydrochlorothiazide (HYZAAR) 100-25 MG tablet; Take 1 tablet by mouth daily.  Dispense: 90 tablet; Refill: 3  2. Need for hepatitis C screening test - Hepatitis c antibody (reflex)  3. Screening for HIV (human immunodeficiency virus) - HIV antibody     Meds ordered this encounter  Medications  . carvedilol (COREG) 12.5 MG tablet    Sig: Take 1 tablet (12.5 mg total) by mouth 2 (two) times daily with a meal.    Dispense:  180 tablet    Refill:  3    Order Specific Question:   Supervising Provider    Answer:   Tresa Garter [id number]  . amLODipine (NORVASC) 10 MG tablet    Sig: Take 1 tablet (10 mg total) by mouth daily.    Dispense:  90 tablet    Refill:  3    Please consider 90 day supplies to promote better adherence    Order Specific Question:   Supervising Provider    Answer:   Tresa Garter [id number]  . losartan-hydrochlorothiazide (HYZAAR) 100-25 MG tablet    Sig: Take 1 tablet by mouth daily.    Dispense:  90 tablet    Refill:  3    Please consider 90 day supplies to promote better adherence    Order Specific Question:   Supervising Provider    Answer:   Tresa Garter [[id number]]    Follow-up: Return if symptoms worsen or fail to improve, for full physical.   Clent Demark PA

## 2016-11-15 NOTE — Telephone Encounter (Signed)
-----   Message from Clent Demark, PA-C sent at 11/15/2016  1:09 PM EDT ----- HIV and HCV negative.

## 2016-11-15 NOTE — Telephone Encounter (Signed)
Results provided to patient. Nat Christen, CMA

## 2016-12-05 ENCOUNTER — Other Ambulatory Visit: Payer: Self-pay | Admitting: Urology

## 2016-12-05 DIAGNOSIS — N201 Calculus of ureter: Secondary | ICD-10-CM

## 2016-12-05 MED FILL — LOSARTAN-HCTZ 100-25 MG TAB: 100-25 | 90 days supply | Qty: 90 | Fill #0

## 2016-12-05 MED FILL — ?OMEPRAZOLE DR 20 MG CAPSUL: 20 | 30 days supply | Qty: 30 | Fill #4

## 2016-12-05 MED FILL — AMLODIPINE BESYLATE 10 MG T: 10 | 90 days supply | Qty: 90 | Fill #0

## 2016-12-11 ENCOUNTER — Ambulatory Visit (HOSPITAL_COMMUNITY)
Admission: RE | Admit: 2016-12-11 | Discharge: 2016-12-11 | Disposition: A | Discharge status: Home / Self Care | Payer: Self-pay | Source: Ambulatory Visit | Attending: Urology | Admitting: Urology

## 2016-12-11 DIAGNOSIS — R93422 Abnormal radiologic findings on diagnostic imaging of left kidney: Secondary | ICD-10-CM | POA: Insufficient documentation

## 2016-12-11 DIAGNOSIS — K769 Liver disease, unspecified: Secondary | ICD-10-CM | POA: Insufficient documentation

## 2016-12-11 DIAGNOSIS — N201 Calculus of ureter: Secondary | ICD-10-CM

## 2017-01-09 ENCOUNTER — Telehealth (INDEPENDENT_AMBULATORY_CARE_PROVIDER_SITE_OTHER): Payer: Self-pay | Admitting: Physician Assistant

## 2017-01-09 MED FILL — ?OMEPRAZOLE DR 20 MG CAPSUL: 20 | 30 days supply | Qty: 30 | Fill #5

## 2017-01-09 NOTE — Telephone Encounter (Signed)
Pt wants to know if he  can take probiotics . Please, call him back

## 2017-01-09 NOTE — Telephone Encounter (Signed)
Please advise if there is a reason the probiotics are not safe for him

## 2017-01-10 NOTE — Telephone Encounter (Signed)
He may take probiotics unless he has the following: immunosuppressed, pancreatitis, or blood in the stool.

## 2017-01-11 NOTE — Telephone Encounter (Signed)
Patient verified DOB Patient is aware of probiotics being safe as long as there is no bleeding in the stool, pancreatitis or immunosuppressed condition.  Patient denied any of the above concerns so he should be safe to begin probiotics. No further questions at this time.

## 2017-02-11 ENCOUNTER — Ambulatory Visit (INDEPENDENT_AMBULATORY_CARE_PROVIDER_SITE_OTHER): Payer: Self-pay | Admitting: Physician Assistant

## 2017-02-11 MED FILL — ?OMEPRAZOLE DR 20 MG CAPSUL: 20 | 30 days supply | Qty: 30 | Fill #6

## 2017-03-08 MED FILL — AMLODIPINE BESYLATE 10 MG T: 10 | 30 days supply | Qty: 30 | Fill #1

## 2017-03-08 MED FILL — ?OMEPRAZOLE DR 20 MG CAPSUL: 20 | 30 days supply | Qty: 30 | Fill #7

## 2017-03-08 MED FILL — LOSARTAN-HCTZ 100-25 MG TAB: 100-25 | 30 days supply | Qty: 30 | Fill #1

## 2017-04-08 MED FILL — LOSARTAN-HCTZ 100-25 MG TAB: 100-25 | 90 days supply | Qty: 90 | Fill #2

## 2017-04-08 MED FILL — AMLODIPINE BESYLATE 10 MG T: 10 | 90 days supply | Qty: 90 | Fill #2

## 2017-04-08 MED FILL — ?OMEPRAZOLE DR 20MG CAPSULE: 20 | 30 days supply | Qty: 30 | Fill #8

## 2017-04-12 ENCOUNTER — Encounter (INDEPENDENT_AMBULATORY_CARE_PROVIDER_SITE_OTHER): Payer: Self-pay | Admitting: Physician Assistant

## 2017-04-12 ENCOUNTER — Other Ambulatory Visit: Payer: Self-pay

## 2017-04-12 ENCOUNTER — Ambulatory Visit (INDEPENDENT_AMBULATORY_CARE_PROVIDER_SITE_OTHER): Payer: Self-pay | Admitting: Physician Assistant

## 2017-04-12 VITALS — BP 148/98 | HR 85 | Temp 98.8°F | Wt 238.6 lb

## 2017-04-12 DIAGNOSIS — M25461 Effusion, right knee: Secondary | ICD-10-CM

## 2017-04-12 DIAGNOSIS — J069 Acute upper respiratory infection, unspecified: Secondary | ICD-10-CM

## 2017-04-12 MED ORDER — AZITHROMYCIN 250 MG PO TABS: ORAL_TABLET | ORAL | 0 refills | Status: DC

## 2017-04-12 MED ORDER — PREDNISONE 20 MG PO TABS: 20.0000 mg | ORAL_TABLET | Freq: Every day | ORAL | 0 refills | Status: DC

## 2017-04-12 MED ORDER — DM-APAP-CPM 15-500-2 MG PO TABS: 2.0000 | ORAL_TABLET | Freq: Four times a day (QID) | ORAL | 0 refills | Status: AC

## 2017-04-12 MED FILL — AZITHROMYCIN 250 MG TABLET: 250 | 5 days supply | Qty: 6 | Fill #0

## 2017-04-12 MED FILL — predniSONE 20 MG TABS: 20 | 5 days supply | Qty: 5 | Fill #0

## 2017-04-12 NOTE — Progress Notes (Signed)
Subjective:  Patient ID: [name], male    DOB: [birth year]  Age: 56 y.o. MRN: [id number]  CC: left leg swelling  HPI  [name] a 56 y.o.malewith a PMH of alcohol abuse, anxiety, GERD, HTN, and renal stones presents with cold like symptoms since 4 days ago. Some malaise, nausea, fever, chills, body aches, cough, and nasal congestion. Granddaughter and mother is sick with similar symptoms. Took Coricidin with minimal relief.     Also had a swollen left knee as of last week. However, the swelling has almost resolved with rest and ice. Attributed to possible gout. No injury or fall. He is able to bear weight and ambulate normally. Requests recommendation for anti-inflammatory.     Outpatient Medications Prior to Visit  Medication Sig Dispense Refill  . amLODipine (NORVASC) 10 MG tablet Take 1 tablet (10 mg total) by mouth daily. 90 tablet 3  . esomeprazole (NEXIUM) 40 MG capsule Take 1 capsule (40 mg total) by mouth daily. 30 capsule 2  . losartan-hydrochlorothiazide (HYZAAR) 100-25 MG tablet Take 1 tablet by mouth daily. 90 tablet 3  . Multiple Vitamins-Minerals (MULTIVITAMIN WITH MINERALS) tablet Take 1 tablet by mouth daily.    [name] oxyCODONE-acetaminophen (PERCOCET) 5-325 MG tablet Take 1-2 tablets by mouth every 4 (four) hours as needed. 15 tablet 0  . polycarbophil (FIBERCON) 625 MG tablet Take 625 mg by mouth daily.    . tamsulosin (FLOMAX) 0.4 MG CAPS capsule Take 1 capsule (0.4 mg total) by mouth daily. 15 capsule 0  . carbamide peroxide (DEBROX) 6.5 % otic solution Place 5 drops into both ears 2 (two) times daily. 15 mL 0  . carvedilol (COREG) 12.5 MG tablet Take 1 tablet (12.5 mg total) by mouth 2 (two) times daily with a meal. (Patient not taking: Reported on 04/12/2017) 180 tablet 3  . methocarbamol (ROBAXIN) 500 MG tablet Take 500 mg by mouth 4 (four) times daily. Muscle spasms     No facility-administered medications prior to visit.      ROS Review of Systems   Constitutional: Positive for malaise/fatigue. Negative for chills and fever.  HENT: Positive for congestion.   Eyes: Negative for blurred vision.  Respiratory: Positive for cough. Negative for shortness of breath.   Cardiovascular: Negative for chest pain and palpitations.  Gastrointestinal: Positive for nausea. Negative for abdominal pain.  Genitourinary: Negative for dysuria and hematuria.  Musculoskeletal: Positive for joint pain and myalgias.  Skin: Negative for rash.  Neurological: Negative for tingling and headaches.  Psychiatric/Behavioral: Negative for depression. The patient is not nervous/anxious.     Objective:  BP (!) 148/98 (BP Location: Left Arm, Patient Position: Sitting, Cuff Size: Large)   Pulse 85   Temp 98.8 F (37.1 C) (Oral)   Wt 238 lb 9.6 oz (108.2 kg)   SpO2 94%   BMI 32.36 kg/m   BP/Weight 04/12/2017 9/62/8366 06/16/4763  Systolic BP 465 035 465  Diastolic BP 98 86 95  Wt. (Lbs) 238.6 237 -  BMI 32.36 32.14 31.87      Physical Exam  Constitutional: He is oriented to person, place, and time.  Well developed, well nourished, NAD, polite, sound nasally congested, not toxic appearing  HENT:  Head: Normocephalic and atraumatic.  Turbinates hypertrophic and mildly erythematous. No sinus tenderness. TMs normal.  Eyes: No scleral icterus.  Neck: Normal range of motion. Neck supple. No thyromegaly present.  Cardiovascular: Normal rate, regular rhythm and normal heart sounds.  Pulmonary/Chest: Effort normal and breath sounds  normal. No respiratory distress. He has no wheezes. He has no rales.  Abdominal: Soft. Bowel sounds are normal. There is no tenderness.  Musculoskeletal: He exhibits no edema.  Right knee with very mild edema. No overlying erythema or significantly increased warmth. Full aROM of the right knee.  Lymphadenopathy:    He has no cervical adenopathy.  Neurological: He is alert and oriented to person, place, and time. No cranial nerve  deficit. Coordination normal.  Skin: Skin is warm and dry. No rash noted. No erythema. No pallor.  Psychiatric: He has a normal mood and affect. His behavior is normal. Thought content normal.  Vitals reviewed.    Assessment & Plan:      1. Acute upper respiratory infection - predniSONE (DELTASONE) 20 MG tablet; Take 1 tablet (20 mg total) by mouth daily with breakfast.  Dispense: 5 tablet; Refill: 0 - azithromycin (ZITHROMAX) 250 MG tablet; Take two tablets today then one tablet daily thereafter.  Dispense: 6 tablet; Refill: 0 - DM-APAP-CPM (CORICIDIN HBP FLU) 15-500-2 MG TABS; Take 2 tablets by mouth every 6 (six) hours for 5 days.  Dispense: 1 each; Refill: 0  2. Effusion of bursa of right knee - Small effusion, not advisable to drain this small of an effusion. Already self resolving.  - predniSONE (DELTASONE) 20 MG tablet; Take 1 tablet (20 mg total) by mouth daily with breakfast.  Dispense: 5 tablet; Refill: 0   Meds ordered this encounter  Medications  . predniSONE (DELTASONE) 20 MG tablet    Sig: Take 1 tablet (20 mg total) by mouth daily with breakfast.    Dispense:  5 tablet    Refill:  0    Order Specific Question:   Supervising Provider    Answer:   Tresa Garter [id number]  . azithromycin (ZITHROMAX) 250 MG tablet    Sig: Take two tablets today then one tablet daily thereafter.    Dispense:  6 tablet    Refill:  0    Order Specific Question:   Supervising Provider    Answer:   Tresa Garter [id number]  . DM-APAP-CPM (CORICIDIN HBP FLU) 15-500-2 MG TABS    Sig: Take 2 tablets by mouth every 6 (six) hours for 5 days.    Dispense:  1 each    Refill:  0    Order Specific Question:   Supervising Provider    Answer:   Tresa Garter [id number]    Follow-up: Return if symptoms worsen or fail to improve.   Clent Demark PA

## 2017-04-12 NOTE — Patient Instructions (Signed)
Knee Effusion Knee effusion means that you have extra fluid in your knee. This can cause pain. Your knee may be more difficult to bend and move. Follow these instructions at home:  Use crutches as told by your doctor.  Wear a knee brace as told by your doctor.  Apply ice to the swollen area: ? Put ice in a plastic bag. ? Place a towel between your skin and the bag. ? Leave the ice on for 20 minutes, 2-3 times per day.  Keep your knee raised (elevated) when you are sitting or lying down.  Take medicines only as told by your doctor.  Do any rehabilitation or strengthening exercises as told by your doctor.  Rest your knee as told by your doctor. You may start doing your normal activities again when your doctor says it is okay.  Keep all follow-up visits as told by your doctor. This is important. Contact a doctor if:  You continue to have pain in your knee. Get help right away if:  You have increased swelling or redness of your knee.  You have severe pain in your knee.  You have a fever. This information is not intended to replace advice given to you by your health care provider. Make sure you discuss any questions you have with your health care provider. Document Released: 05/26/2010 Document Revised: 09/29/2015 Document Reviewed: 12/07/2013 Elsevier Interactive Patient Education  2018 [employer]. Upper Respiratory Infection, Adult Most upper respiratory infections (URIs) are caused by a virus. A URI affects the nose, throat, and upper air passages. The most common type of URI is often called "the common cold." Follow these instructions at home:  Take medicines only as told by your doctor.  Gargle warm saltwater or take cough drops to comfort your throat as told by your doctor.  Use a warm mist humidifier or inhale steam from a shower to increase air moisture. This may make it easier to breathe.  Drink enough fluid to keep your pee (urine) clear or pale yellow.  Eat  soups and other clear broths.  Have a healthy diet.  Rest as needed.  Go back to work when your fever is gone or your doctor says it is okay. ? You may need to stay home longer to avoid giving your URI to others. ? You can also wear a face mask and wash your hands often to prevent spread of the virus.  Use your inhaler more if you have asthma.  Do not use any tobacco products, including cigarettes, chewing tobacco, or electronic cigarettes. If you need help quitting, ask your doctor. Contact a doctor if:  You are getting worse, not better.  Your symptoms are not helped by medicine.  You have chills.  You are getting more short of breath.  You have brown or red mucus.  You have yellow or brown discharge from your nose.  You have pain in your face, especially when you bend forward.  You have a fever.  You have puffy (swollen) neck glands.  You have pain while swallowing.  You have white areas in the back of your throat. Get help right away if:  You have very bad or constant: ? Headache. ? Ear pain. ? Pain in your forehead, behind your eyes, and over your cheekbones (sinus pain). ? Chest pain.  You have long-lasting (chronic) lung disease and any of the following: ? Wheezing. ? Long-lasting cough. ? Coughing up blood. ? A change in your usual mucus.  You have  a stiff neck.  You have changes in your: ? Vision. ? Hearing. ? Thinking. ? Mood. This information is not intended to replace advice given to you by your health care provider. Make sure you discuss any questions you have with your health care provider. Document Released: 10/10/2007 Document Revised: 12/25/2015 Document Reviewed: 07/29/2013 Elsevier Interactive Patient Education  2018 [employer].

## 2017-04-24 ENCOUNTER — Encounter (INDEPENDENT_AMBULATORY_CARE_PROVIDER_SITE_OTHER): Payer: Self-pay | Admitting: Physician Assistant

## 2017-04-24 ENCOUNTER — Other Ambulatory Visit: Payer: Self-pay

## 2017-04-24 ENCOUNTER — Ambulatory Visit (INDEPENDENT_AMBULATORY_CARE_PROVIDER_SITE_OTHER): Payer: Self-pay | Admitting: Physician Assistant

## 2017-04-24 VITALS — BP 164/94 | HR 95 | Temp 98.1°F | Wt 242.8 lb

## 2017-04-24 DIAGNOSIS — I10 Essential (primary) hypertension: Secondary | ICD-10-CM

## 2017-04-24 DIAGNOSIS — Z9119 Patient's noncompliance with other medical treatment and regimen: Secondary | ICD-10-CM

## 2017-04-24 DIAGNOSIS — Z91199 Patient's noncompliance with other medical treatment and regimen due to unspecified reason: Secondary | ICD-10-CM

## 2017-04-24 DIAGNOSIS — G4700 Insomnia, unspecified: Secondary | ICD-10-CM

## 2017-04-24 MED ORDER — MELATONIN 5 MG PO TABS: 1.0000 | ORAL_TABLET | Freq: Every day | ORAL | 2 refills | Status: DC

## 2017-04-24 MED ORDER — HYDROXYZINE HCL 25 MG PO TABS: 25.0000 mg | ORAL_TABLET | Freq: Every day | ORAL | 2 refills | Status: DC

## 2017-04-24 NOTE — Progress Notes (Signed)
Subjective:  Patient ID: [name], male    DOB: 11/21/1960  Age: 56 y.o. MRN: [id number]  CC: insomnia  HPI  [name] a 56 y.o.malewith a PMH of alcohol abuse, anxiety, GERD, HTN, and renal stonespresents to discuss insomnia. Insomnia onset since approximately 6 months ago. Difficulty in maintaining sleep. Watches TV until he can get to sleep but usually wakes up 2-3 hours later. Very difficult to fall back to sleep once he wakes up. Also has nocturia twice per night. BP uncontrolled. Not taking anti-hypertensives as directed. Does not endorse alcohol abuse, chest pain, palpitations, SOB, HA, abdominal pain, mood disorder, or GI/GU sxs.    Outpatient Medications Prior to Visit  Medication Sig Dispense Refill  . amLODipine (NORVASC) 10 MG tablet Take 1 tablet (10 mg total) by mouth daily. 90 tablet 3  . azithromycin (ZITHROMAX) 250 MG tablet Take two tablets today then one tablet daily thereafter. 6 tablet 0  . carbamide peroxide (DEBROX) 6.5 % otic solution Place 5 drops into both ears 2 (two) times daily. 15 mL 0  . esomeprazole (NEXIUM) 40 MG capsule Take 1 capsule (40 mg total) by mouth daily. 30 capsule 2  . losartan-hydrochlorothiazide (HYZAAR) 100-25 MG tablet Take 1 tablet by mouth daily. 90 tablet 3  . methocarbamol (ROBAXIN) 500 MG tablet Take 500 mg by mouth 4 (four) times daily. Muscle spasms    . Multiple Vitamins-Minerals (MULTIVITAMIN WITH MINERALS) tablet Take 1 tablet by mouth daily.    [name] oxyCODONE-acetaminophen (PERCOCET) 5-325 MG tablet Take 1-2 tablets by mouth every 4 (four) hours as needed. 15 tablet 0  . polycarbophil (FIBERCON) 625 MG tablet Take 625 mg by mouth daily.    . predniSONE (DELTASONE) 20 MG tablet Take 1 tablet (20 mg total) by mouth daily with breakfast. 5 tablet 0  . tamsulosin (FLOMAX) 0.4 MG CAPS capsule Take 1 capsule (0.4 mg total) by mouth daily. 15 capsule 0  . carvedilol (COREG) 12.5 MG tablet Take 1 tablet (12.5 mg total) by mouth  2 (two) times daily with a meal. (Patient not taking: Reported on 04/12/2017) 180 tablet 3   No facility-administered medications prior to visit.      ROS Review of Systems  Constitutional: Negative for chills, fever and malaise/fatigue.  Eyes: Negative for blurred vision.  Respiratory: Negative for shortness of breath.   Cardiovascular: Negative for chest pain and palpitations.  Gastrointestinal: Negative for abdominal pain and nausea.  Genitourinary: Negative for dysuria and hematuria.  Musculoskeletal: Negative for joint pain and myalgias.  Skin: Negative for rash.  Neurological: Negative for tingling and headaches.  Psychiatric/Behavioral: Negative for depression. The patient has insomnia. The patient is not nervous/anxious.     Objective:  BP (!) 164/94 (BP Location: Left Arm, Patient Position: Sitting, Cuff Size: Large)   Pulse 95   Temp 98.1 F (36.7 C) (Oral)   Wt 242 lb 12.8 oz (110.1 kg)   SpO2 94%   BMI 32.93 kg/m   BP/Weight 04/24/2017 04/12/2017 7/40/8144  Systolic BP 818 563 149  Diastolic BP 94 98 86  Wt. (Lbs) 242.8 238.6 237  BMI 32.93 32.36 32.14      Physical Exam  Constitutional: He is oriented to person, place, and time.  Well developed, well nourished, NAD, polite  HENT:  Head: Normocephalic and atraumatic.  Eyes: Conjunctivae are normal. No scleral icterus.  Neck: Normal range of motion. Neck supple. No thyromegaly present.  Cardiovascular: Normal rate, regular rhythm and normal heart sounds.  Pulmonary/Chest: Effort normal and breath sounds normal.  Musculoskeletal: He exhibits no edema.  Neurological: He is alert and oriented to person, place, and time. No cranial nerve deficit. Coordination normal.  Skin: Skin is warm and dry. No rash noted. No erythema. No pallor.  Psychiatric: He has a normal mood and affect. His behavior is normal. Thought content normal.  Vitals reviewed.    Assessment & Plan:   1. Insomnia, unspecified type - Has  poor sleep hygiene. Counseled on establishing a routine for sleep with avoidance of entertainment and light sources.  - Begin  Melatonin 5 MG TABS; Take 1 tablet (5 mg total) by mouth daily.  Dispense: 30 tablet; Refill: 2 - Begin hydrOXYzine (ATARAX/VISTARIL) 25 MG tablet; Take 1 tablet (25 mg total) by mouth at bedtime.  Dispense: 30 tablet; Refill: 2  2. Hypertension, unspecified type - Take amlodipine, Hyzaar, and carvedilol as directed.  3. Noncompliance - Advised to take anti-hypertensives as directed   Meds ordered this encounter  Medications  . Melatonin 5 MG TABS    Sig: Take 1 tablet (5 mg total) by mouth daily.    Dispense:  30 tablet    Refill:  2    Order Specific Question:   Supervising Provider    Answer:   Tresa Garter [id number]  . hydrOXYzine (ATARAX/VISTARIL) 25 MG tablet    Sig: Take 1 tablet (25 mg total) by mouth at bedtime.    Dispense:  30 tablet    Refill:  2    Order Specific Question:   Supervising Provider    Answer:   Tresa Garter [id number]    Follow-up: Return in about 4 weeks (around 05/22/2017) for thn and insomnia.   Clent Demark PA

## 2017-04-24 NOTE — Patient Instructions (Signed)

## 2017-05-08 ENCOUNTER — Ambulatory Visit: Payer: Self-pay

## 2017-05-10 ENCOUNTER — Telehealth (INDEPENDENT_AMBULATORY_CARE_PROVIDER_SITE_OTHER): Payer: Self-pay | Admitting: Physician Assistant

## 2017-05-10 NOTE — Telephone Encounter (Signed)
Patient lvm requesting a refill of amLODipine (NORVASC) 10 MG tablet  Sent to [facility] at [employer]

## 2017-05-20 MED FILL — ?OMEPRAZOLE DR 20MG CAPSULE: 20 | 30 days supply | Qty: 30 | Fill #9

## 2017-05-21 ENCOUNTER — Ambulatory Visit: Payer: Self-pay | Attending: Physician Assistant

## 2017-05-22 ENCOUNTER — Encounter (INDEPENDENT_AMBULATORY_CARE_PROVIDER_SITE_OTHER): Payer: Self-pay | Admitting: Physician Assistant

## 2017-05-22 ENCOUNTER — Ambulatory Visit (INDEPENDENT_AMBULATORY_CARE_PROVIDER_SITE_OTHER): Payer: Self-pay | Admitting: Physician Assistant

## 2017-05-22 ENCOUNTER — Other Ambulatory Visit: Payer: Self-pay

## 2017-05-22 VITALS — BP 118/77 | HR 73 | Temp 97.9°F | Wt 244.8 lb

## 2017-05-22 DIAGNOSIS — M48061 Spinal stenosis, lumbar region without neurogenic claudication: Secondary | ICD-10-CM

## 2017-05-22 DIAGNOSIS — M5441 Lumbago with sciatica, right side: Secondary | ICD-10-CM

## 2017-05-22 DIAGNOSIS — I10 Essential (primary) hypertension: Secondary | ICD-10-CM

## 2017-05-22 DIAGNOSIS — G8929 Other chronic pain: Secondary | ICD-10-CM

## 2017-05-22 DIAGNOSIS — M9983 Other biomechanical lesions of lumbar region: Secondary | ICD-10-CM

## 2017-05-22 MED ORDER — GABAPENTIN 300 MG PO CAPS: 300.0000 mg | ORAL_CAPSULE | Freq: Three times a day (TID) | ORAL | 3 refills | Status: DC

## 2017-05-22 MED FILL — GABAPENTIN 300 MG CAPSULE: 300 | 30 days supply | Qty: 90 | Fill #0

## 2017-05-22 NOTE — Progress Notes (Signed)
Subjective:  Patient ID: [name], male    DOB: June 29, 1960  Age: 57 y.o. MRN: [id number]  CC:  F/u HTN and insomnia  HPI  [name] a 57 y.o.malewith a PMH of alcohol abuse, anxiety, GERD, HTN, and renal stonespresentsto discuss insomnia and HTN. Insomnia onset since approximately 7 months ago. Difficulty in maintaining sleep. Watches TV until he can get to sleep but usually wakes up 2-3 hours later. Very difficult to fall back to sleep once he wakes up. Advised to take Melatonin 5 mg and Hydroxyzine 25 mg. Said the medications were useful to help him sleep. Thinks his back pain is a major contributor to his insomnia. Goes to [facility] orthopedics every other month for mild multilevel disc and facet degeneration, greatest at L4-5 where there is mild spinal canal and lateral recess stenosis and moderate right neural foraminal stenosis. Had the option to have surgery but patient declined.       Also has nocturia twice per night. BP controlled today in clinic. He is taking amlodipine, Hyzaar, and carvedilol as directed.      Outpatient Medications Prior to Visit  Medication Sig Dispense Refill  . amLODipine (NORVASC) 10 MG tablet Take 1 tablet (10 mg total) by mouth daily. 90 tablet 3  . azithromycin (ZITHROMAX) 250 MG tablet Take two tablets today then one tablet daily thereafter. 6 tablet 0  . carbamide peroxide (DEBROX) 6.5 % otic solution Place 5 drops into both ears 2 (two) times daily. 15 mL 0  . carvedilol (COREG) 12.5 MG tablet Take 1 tablet (12.5 mg total) by mouth 2 (two) times daily with a meal. 180 tablet 3  . esomeprazole (NEXIUM) 40 MG capsule Take 1 capsule (40 mg total) by mouth daily. 30 capsule 2  . hydrOXYzine (ATARAX/VISTARIL) 25 MG tablet Take 1 tablet (25 mg total) by mouth at bedtime. 30 tablet 2  . losartan-hydrochlorothiazide (HYZAAR) 100-25 MG tablet Take 1 tablet by mouth daily. 90 tablet 3  . Melatonin 5 MG TABS Take 1 tablet (5 mg total) by mouth  daily. 30 tablet 2  . methocarbamol (ROBAXIN) 500 MG tablet Take 500 mg by mouth 4 (four) times daily. Muscle spasms    . Multiple Vitamins-Minerals (MULTIVITAMIN WITH MINERALS) tablet Take 1 tablet by mouth daily.    [name] oxyCODONE-acetaminophen (PERCOCET) 5-325 MG tablet Take 1-2 tablets by mouth every 4 (four) hours as needed. 15 tablet 0  . polycarbophil (FIBERCON) 625 MG tablet Take 625 mg by mouth daily.    . predniSONE (DELTASONE) 20 MG tablet Take 1 tablet (20 mg total) by mouth daily with breakfast. 5 tablet 0  . tamsulosin (FLOMAX) 0.4 MG CAPS capsule Take 1 capsule (0.4 mg total) by mouth daily. 15 capsule 0   No facility-administered medications prior to visit.      ROS Review of Systems  Constitutional: Negative for chills, fever and malaise/fatigue.  Eyes: Negative for blurred vision.  Respiratory: Negative for shortness of breath.   Cardiovascular: Negative for chest pain and palpitations.  Gastrointestinal: Negative for abdominal pain and nausea.  Genitourinary: Negative for dysuria and hematuria.  Musculoskeletal: Positive for back pain. Negative for joint pain and myalgias.  Skin: Negative for rash.  Neurological: Negative for tingling and headaches.       Right sided sciatica  Psychiatric/Behavioral: Negative for depression. The patient is not nervous/anxious.     Objective:  BP 118/77 (BP Location: Left Arm, Patient Position: Sitting, Cuff Size: Large)   Pulse 73  Temp 97.9 F (36.6 C) (Oral)   Wt 244 lb 12.8 oz (111 kg)   SpO2 94%   BMI 33.20 kg/m   BP/Weight 05/22/2017 04/24/2017 67/05/2456  Systolic BP 099 833 825  Diastolic BP 77 94 98  Wt. (Lbs) 244.8 242.8 238.6  BMI 33.2 32.93 32.36      Physical Exam  Constitutional: He is oriented to person, place, and time.  Well developed, overweight, NAD, polite  HENT:  Head: Normocephalic and atraumatic.  Eyes: No scleral icterus.  Neck: Normal range of motion.  Cardiovascular: Normal rate, regular  rhythm and normal heart sounds.  Pulmonary/Chest: Effort normal and breath sounds normal.  Musculoskeletal: He exhibits no edema.  Neurological: He is alert and oriented to person, place, and time. No cranial nerve deficit. Coordination normal.  Normal gait, strength 5/5 throughout  Skin: Skin is warm and dry. No rash noted. No erythema. No pallor.  Psychiatric: He has a normal mood and affect. His behavior is normal. Thought content normal.  Vitals reviewed.    Assessment & Plan:    1. Foraminal stenosis of lumbar region - gabapentin (NEURONTIN) 300 MG capsule; Take 1 capsule (300 mg total) by mouth 3 (three) times daily.  Dispense: 90 capsule; Refill: 3  2. Chronic right-sided low back pain with right-sided sciatica - gabapentin (NEURONTIN) 300 MG capsule; Take 1 capsule (300 mg total) by mouth 3 (three) times daily.  Dispense: 90 capsule; Refill: 3  3. Hypertension, unspecified type - Continue taking anti-hypertensives as directed. BP well controlled on amlodipine, Hyzaar, and carvedilol.   Meds ordered this encounter  Medications  . gabapentin (NEURONTIN) 300 MG capsule    Sig: Take 1 capsule (300 mg total) by mouth 3 (three) times daily.    Dispense:  90 capsule    Refill:  3    Order Specific Question:   Supervising Provider    Answer:   Tresa Garter [id number]    Follow-up: Return in about 4 weeks (around 06/19/2017) for back pain.   Clent Demark PA

## 2017-05-22 NOTE — Patient Instructions (Signed)
Gabapentin capsules or tablets What is this medicine? GABAPENTIN (GA ba pen tin) is used to control partial seizures in adults with epilepsy. It is also used to treat certain types of nerve pain. This medicine may be used for other purposes; ask your health care provider or pharmacist if you have questions. COMMON BRAND NAME(S): Active-PAC with Gabapentin, Gabarone, Neurontin What should I tell my health care provider before I take this medicine? They need to know if you have any of these conditions: -kidney disease -suicidal thoughts, plans, or attempt; a previous suicide attempt by you or a family member -an unusual or allergic reaction to gabapentin, other medicines, foods, dyes, or preservatives -pregnant or trying to get pregnant -breast-feeding How should I use this medicine? Take this medicine by mouth with a glass of water. Follow the directions on the prescription label. You can take it with or without food. If it upsets your stomach, take it with food.Take your medicine at regular intervals. Do not take it more often than directed. Do not stop taking except on your doctor's advice. If you are directed to break the 600 or 800 mg tablets in half as part of your dose, the extra half tablet should be used for the next dose. If you have not used the extra half tablet within 28 days, it should be thrown away. A special MedGuide will be given to you by the pharmacist with each prescription and refill. Be sure to read this information carefully each time. Talk to your pediatrician regarding the use of this medicine in children. Special care may be needed. Overdosage: If you think you have taken too much of this medicine contact a poison control center or emergency room at once. NOTE: This medicine is only for you. Do not share this medicine with others. What if I miss a dose? If you miss a dose, take it as soon as you can. If it is almost time for your next dose, take only that dose. Do not  take double or extra doses. What may interact with this medicine? Do not take this medicine with any of the following medications: -other gabapentin products This medicine may also interact with the following medications: -alcohol -antacids -antihistamines for allergy, cough and cold -certain medicines for anxiety or sleep -certain medicines for depression or psychotic disturbances -homatropine; hydrocodone -naproxen -narcotic medicines (opiates) for pain -phenothiazines like chlorpromazine, mesoridazine, prochlorperazine, thioridazine This list may not describe all possible interactions. Give your health care provider a list of all the medicines, herbs, non-prescription drugs, or dietary supplements you use. Also tell them if you smoke, drink alcohol, or use illegal drugs. Some items may interact with your medicine. What should I watch for while using this medicine? Visit your doctor or health care professional for regular checks on your progress. You may want to keep a record at home of how you feel your condition is responding to treatment. You may want to share this information with your doctor or health care professional at each visit. You should contact your doctor or health care professional if your seizures get worse or if you have any new types of seizures. Do not stop taking this medicine or any of your seizure medicines unless instructed by your doctor or health care professional. Stopping your medicine suddenly can increase your seizures or their severity. Wear a medical identification bracelet or chain if you are taking this medicine for seizures, and carry a card that lists all your medications. You may get drowsy, dizzy,   or have blurred vision. Do not drive, use machinery, or do anything that needs mental alertness until you know how this medicine affects you. To reduce dizzy or fainting spells, do not sit or stand up quickly, especially if you are an older patient. Alcohol can  increase drowsiness and dizziness. Avoid alcoholic drinks. Your mouth may get dry. Chewing sugarless gum or sucking hard candy, and drinking plenty of water will help. The use of this medicine may increase the chance of suicidal thoughts or actions. Pay special attention to how you are responding while on this medicine. Any worsening of mood, or thoughts of suicide or dying should be reported to your health care professional right away. Women who become pregnant while using this medicine may enroll in the [place] Antiepileptic Drug Pregnancy Registry by calling 1-888-233-2334. This registry collects information about the safety of antiepileptic drug use during pregnancy. What side effects may I notice from receiving this medicine? Side effects that you should report to your doctor or health care professional as soon as possible: -allergic reactions like skin rash, itching or hives, swelling of the face, lips, or tongue -worsening of mood, thoughts or actions of suicide or dying Side effects that usually do not require medical attention (report to your doctor or health care professional if they continue or are bothersome): -constipation -difficulty walking or controlling muscle movements -dizziness -nausea -slurred speech -tiredness -tremors -weight gain This list may not describe all possible side effects. Call your doctor for medical advice about side effects. You may report side effects to FDA at 1-800-FDA-1088. Where should I keep my medicine? Keep out of reach of children. This medicine may cause accidental overdose and death if it taken by other adults, children, or pets. Mix any unused medicine with a substance like cat litter or coffee grounds. Then throw the medicine away in a sealed container like a sealed bag or a coffee can with a lid. Do not use the medicine after the expiration date. Store at room temperature between 15 and 30 degrees C (59 and 86 degrees F). NOTE: This  sheet is a summary. It may not cover all possible information. If you have questions about this medicine, talk to your doctor, pharmacist, or health care provider.  2018 Elsevier/Gold Standard (2013-06-19 15:26:50)  

## 2017-05-30 MED FILL — CARVEDILOL 12.5 MG TABLET: 12.5 | 90 days supply | Qty: 180 | Fill #0

## 2017-06-18 MED FILL — ?OMEPRAZOLE 20 MG CPDR: 20 | 30 days supply | Qty: 30 | Fill #10

## 2017-06-19 ENCOUNTER — Ambulatory Visit (INDEPENDENT_AMBULATORY_CARE_PROVIDER_SITE_OTHER): Payer: Self-pay | Admitting: Physician Assistant

## 2017-07-02 ENCOUNTER — Encounter (INDEPENDENT_AMBULATORY_CARE_PROVIDER_SITE_OTHER): Payer: Self-pay | Admitting: Physician Assistant

## 2017-07-02 ENCOUNTER — Ambulatory Visit (INDEPENDENT_AMBULATORY_CARE_PROVIDER_SITE_OTHER): Payer: Self-pay | Admitting: Physician Assistant

## 2017-07-02 VITALS — BP 123/82 | HR 82 | Temp 98.2°F | Resp 18 | Ht 72.0 in | Wt 242.0 lb

## 2017-07-02 DIAGNOSIS — M9983 Other biomechanical lesions of lumbar region: Secondary | ICD-10-CM

## 2017-07-02 DIAGNOSIS — G8929 Other chronic pain: Secondary | ICD-10-CM

## 2017-07-02 DIAGNOSIS — M5441 Lumbago with sciatica, right side: Secondary | ICD-10-CM

## 2017-07-02 DIAGNOSIS — M48061 Spinal stenosis, lumbar region without neurogenic claudication: Secondary | ICD-10-CM | POA: Insufficient documentation

## 2017-07-02 DIAGNOSIS — Z79899 Other long term (current) drug therapy: Secondary | ICD-10-CM

## 2017-07-02 MED ORDER — GABAPENTIN 300 MG PO CAPS: 600.0000 mg | ORAL_CAPSULE | Freq: Three times a day (TID) | ORAL | 5 refills | Status: DC

## 2017-07-02 MED ORDER — CYCLOBENZAPRINE HCL 10 MG PO TABS: 10.0000 mg | ORAL_TABLET | Freq: Every day | ORAL | 0 refills | Status: DC

## 2017-07-02 MED FILL — CYCLOBENZAPRINE 10 MG TAB: 10 | 30 days supply | Qty: 30 | Fill #0

## 2017-07-02 MED FILL — GABAPENTIN 300 MG CAPSULE: 300 | 30 days supply | Qty: 180 | Fill #0

## 2017-07-02 NOTE — Patient Instructions (Signed)
Cyclobenzaprine tablets What is this medicine? CYCLOBENZAPRINE (sye kloe BEN za preen) is a muscle relaxer. It is used to treat muscle pain, spasms, and stiffness. This medicine may be used for other purposes; ask your health care provider or pharmacist if you have questions. COMMON BRAND NAME(S): Fexmid, Flexeril What should I tell my health care provider before I take this medicine? They need to know if you have any of these conditions: -heart disease, irregular heartbeat, or previous heart attack -liver disease -thyroid problem -an unusual or allergic reaction to cyclobenzaprine, tricyclic antidepressants, lactose, other medicines, foods, dyes, or preservatives -pregnant or trying to get pregnant -breast-feeding How should I use this medicine? Take this medicine by mouth with a glass of water. Follow the directions on the prescription label. If this medicine upsets your stomach, take it with food or milk. Take your medicine at regular intervals. Do not take it more often than directed. Talk to your pediatrician regarding the use of this medicine in children. Special care may be needed. Overdosage: If you think you have taken too much of this medicine contact a poison control center or emergency room at once. NOTE: This medicine is only for you. Do not share this medicine with others. What if I miss a dose? If you miss a dose, take it as soon as you can. If it is almost time for your next dose, take only that dose. Do not take double or extra doses. What may interact with this medicine? Do not take this medicine with any of the following medications: -certain medicines for fungal infections like fluconazole, itraconazole, ketoconazole, posaconazole, voriconazole -cisapride -dofetilide -dronedarone -halofantrine -levomethadyl -MAOIs like Carbex, Eldepryl, Marplan, Nardil, and Parnate -narcotic medicines for cough -pimozide -thioridazine -ziprasidone This medicine may also interact  with the following medications: -alcohol -antihistamines for allergy, cough and cold -certain medicines for anxiety or sleep -certain medicines for cancer -certain medicines for depression like amitriptyline, fluoxetine, sertraline -certain medicines for infection like alfuzosin, chloroquine, clarithromycin, levofloxacin, mefloquine, pentamidine, troleandomycin -certain medicines for irregular heart beat -certain medicines for seizures like phenobarbital, primidone -contrast dyes -general anesthetics like halothane, isoflurane, methoxyflurane, propofol -local anesthetics like lidocaine, pramoxine, tetracaine -medicines that relax muscles for surgery -narcotic medicines for pain -other medicines that prolong the QT interval (cause an abnormal heart rhythm) -phenothiazines like chlorpromazine, mesoridazine, prochlorperazine This list may not describe all possible interactions. Give your health care provider a list of all the medicines, herbs, non-prescription drugs, or dietary supplements you use. Also tell them if you smoke, drink alcohol, or use illegal drugs. Some items may interact with your medicine. What should I watch for while using this medicine? Tell your doctor or health care professional if your symptoms do not start to get better or if they get worse. You may get drowsy or dizzy. Do not drive, use machinery, or do anything that needs mental alertness until you know how this medicine affects you. Do not stand or sit up quickly, especially if you are an older patient. This reduces the risk of dizzy or fainting spells. Alcohol may interfere with the effect of this medicine. Avoid alcoholic drinks. If you are taking another medicine that also causes drowsiness, you may have more side effects. Give your health care provider a list of all medicines you use. Your doctor will tell you how much medicine to take. Do not take more medicine than directed. Call emergency for help if you have  problems breathing or unusual sleepiness. Your mouth may get dry. Chewing   sugarless gum or sucking hard candy, and drinking plenty of water may help. Contact your doctor if the problem does not go away or is severe. What side effects may I notice from receiving this medicine? Side effects that you should report to your doctor or health care professional as soon as possible: -allergic reactions like skin rash, itching or hives, swelling of the face, lips, or tongue -breathing problems -chest pain -fast, irregular heartbeat -hallucinations -seizures -unusually weak or tired Side effects that usually do not require medical attention (report to your doctor or health care professional if they continue or are bothersome): -headache -nausea, vomiting This list may not describe all possible side effects. Call your doctor for medical advice about side effects. You may report side effects to FDA at 1-800-FDA-1088. Where should I keep my medicine? Keep out of the reach of children. Store at room temperature between 15 and 30 degrees C (59 and 86 degrees F). Keep container tightly closed. Throw away any unused medicine after the expiration date. NOTE: This sheet is a summary. It may not cover all possible information. If you have questions about this medicine, talk to your doctor, pharmacist, or health care provider.  2018 Elsevier/Gold Standard (2015-02-01 12:05:46)  

## 2017-07-02 NOTE — Progress Notes (Signed)
Subjective:  Patient ID: [name], male    DOB: 09-Dec-1960  Age: 57 y.o. MRN: [id number]  CC:  Back pain  HPI [name] a 57 y.o.malewith a PMH of alcohol abuse, anxiety, GERD, HTN, and renal stonespresents with complaint of LBP with sciatica 2/2 to foraminal stenosis. Had physical therapy and multiple injections to the back in the past. Frequency of LBP qday and worse in the mornings until he can "warm up". Pain is usually rated at a 5/10. Applies ice packs from time to time. Gabapentin has been moderately helpful in relieving pain.     Reports his insomnia has improved since turning off the television at night and taking melatonin.     Outpatient Medications Prior to Visit  Medication Sig Dispense Refill  . amLODipine (NORVASC) 10 MG tablet Take 1 tablet (10 mg total) by mouth daily. 90 tablet 3  . carbamide peroxide (DEBROX) 6.5 % otic solution Place 5 drops into both ears 2 (two) times daily. 15 mL 0  . carvedilol (COREG) 12.5 MG tablet Take 1 tablet (12.5 mg total) by mouth 2 (two) times daily with a meal. 180 tablet 3  . esomeprazole (NEXIUM) 40 MG capsule Take 1 capsule (40 mg total) by mouth daily. 30 capsule 2  . gabapentin (NEURONTIN) 300 MG capsule Take 1 capsule (300 mg total) by mouth 3 (three) times daily. 90 capsule 3  . hydrOXYzine (ATARAX/VISTARIL) 25 MG tablet Take 1 tablet (25 mg total) by mouth at bedtime. 30 tablet 2  . losartan-hydrochlorothiazide (HYZAAR) 100-25 MG tablet Take 1 tablet by mouth daily. 90 tablet 3  . Melatonin 5 MG TABS Take 1 tablet (5 mg total) by mouth daily. 30 tablet 2  . methocarbamol (ROBAXIN) 500 MG tablet Take 500 mg by mouth 4 (four) times daily. Muscle spasms    . Multiple Vitamins-Minerals (MULTIVITAMIN WITH MINERALS) tablet Take 1 tablet by mouth daily.    . polycarbophil (FIBERCON) 625 MG tablet Take 625 mg by mouth daily.    . tamsulosin (FLOMAX) 0.4 MG CAPS capsule Take 1 capsule (0.4 mg total) by mouth daily. 15 capsule  0  . azithromycin (ZITHROMAX) 250 MG tablet Take two tablets today then one tablet daily thereafter. 6 tablet 0  . predniSONE (DELTASONE) 20 MG tablet Take 1 tablet (20 mg total) by mouth daily with breakfast. 5 tablet 0   No facility-administered medications prior to visit.      ROS Review of Systems  Constitutional: Negative for chills, fever and malaise/fatigue.  Eyes: Negative for blurred vision.  Respiratory: Negative for shortness of breath.   Cardiovascular: Negative for chest pain and palpitations.  Gastrointestinal: Negative for abdominal pain and nausea.  Genitourinary: Negative for dysuria and hematuria.  Musculoskeletal: Positive for back pain. Negative for myalgias.  Skin: Negative for rash.  Neurological: Negative for tingling and headaches.  Psychiatric/Behavioral: Negative for depression. The patient is not nervous/anxious.     Objective:  BP 123/82 (BP Location: Left Arm, Patient Position: Sitting, Cuff Size: Normal)   Pulse 82   Temp 98.2 F (36.8 C) (Oral)   Resp 18   Ht 6' (1.829 m)   Wt 242 lb (109.8 kg)   SpO2 100%   BMI 32.82 kg/m   BP/Weight 07/02/2017 05/22/2017 18/29/9371  Systolic BP 696 789 381  Diastolic BP 82 77 94  Wt. (Lbs) 242 244.8 242.8  BMI 32.82 33.2 32.93      Physical Exam  Constitutional: He is oriented to person,  place, and time.  Well developed, well nourished, NAD, polite  HENT:  Head: Normocephalic and atraumatic.  Eyes: No scleral icterus.  Neck: Normal range of motion.  Cardiovascular: Normal rate, regular rhythm and normal heart sounds.  Pulmonary/Chest: Effort normal and breath sounds normal.  Musculoskeletal: He exhibits no edema.  Increased tonicity of the paraspinals of the T and L spine  Neurological: He is alert and oriented to person, place, and time. No cranial nerve deficit. Coordination normal.  Normal gait  Skin: Skin is warm and dry. No rash noted. No erythema. No pallor.  Psychiatric: He has a normal  mood and affect. His behavior is normal. Thought content normal.  Vitals reviewed.    Assessment & Plan:   1. Foraminal stenosis of lumbar region - Ambulatory referral to Physical Therapy - gabapentin (NEURONTIN) 300 MG capsule; Take 2 capsules (600 mg total) by mouth 3 (three) times daily.  Dispense: 180 capsule; Refill: 5  2. Chronic right-sided low back pain with right-sided sciatica - Ambulatory referral to Physical Therapy - gabapentin (NEURONTIN) 300 MG capsule; Take 2 capsules (600 mg total) by mouth 3 (three) times daily.  Dispense: 180 capsule; Refill: 5  3. High risk medication use - Basic Metabolic Panel - cyclobenzaprine (FLEXERIL) 10 MG tablet; Take 1 tablet (10 mg total) by mouth at bedtime.  Dispense: 30 tablet; Refill: 0   Meds ordered this encounter  Medications  . gabapentin (NEURONTIN) 300 MG capsule    Sig: Take 2 capsules (600 mg total) by mouth 3 (three) times daily.    Dispense:  180 capsule    Refill:  5    Order Specific Question:   Supervising Provider    Answer:   Tresa Garter [id number]  . cyclobenzaprine (FLEXERIL) 10 MG tablet    Sig: Take 1 tablet (10 mg total) by mouth at bedtime.    Dispense:  30 tablet    Refill:  0    Order Specific Question:   Supervising Provider    Answer:   Tresa Garter [id number]    Follow-up: Return in about 8 weeks (around 08/27/2017).   Clent Demark PA

## 2017-07-03 LAB — BASIC METABOLIC PANEL
BUN/Creatinine Ratio: 17 (ref 9–20)
BUN: 23 mg/dL (ref 6–24)
CO2: 24 mmol/L (ref 20–29)
Calcium: 9.9 mg/dL (ref 8.7–10.2)
Chloride: 99 mmol/L (ref 96–106)
Creatinine, Ser: 1.33 mg/dL — ABNORMAL HIGH (ref 0.76–1.27)
GFR, EST AFRICAN AMERICAN: 69 mL/min/{1.73_m2} (ref >59)
GFR, EST NON AFRICAN AMERICAN: 60 mL/min/{1.73_m2} (ref >59)
Glucose: 113 mg/dL — ABNORMAL HIGH (ref 65–99)
POTASSIUM: 3.9 mmol/L (ref 3.5–5.2)
Sodium: 142 mmol/L (ref 134–144)

## 2017-07-04 ENCOUNTER — Telehealth (INDEPENDENT_AMBULATORY_CARE_PROVIDER_SITE_OTHER): Payer: Self-pay | Admitting: *Deleted

## 2017-07-04 NOTE — Telephone Encounter (Signed)
Medical Assistant left message on patient's home and cell voicemail. Voicemail states to give a call back to [place] with [facility] at [phone]. Patient is aware of mild kidney impairment being noted and being monitored every 6-12 months. Patient advised to take medications as prescribed to keep control of BP.

## 2017-07-04 NOTE — Telephone Encounter (Signed)
-----   Message from Clent Demark, PA-C sent at 07/03/2017  5:04 PM EST ----- Very mild kidney impairment, nothing to worry about at this point. Just have periodic checks every 6-12 months of kidney function.

## 2017-07-05 MED FILL — POTASSIUM CITRATE ER 10 MEQ: 10 MEQ | 30 days supply | Qty: 120 | Fill #1

## 2017-07-10 MED FILL — ?TAMSULOSIN HCL 0.4 MG CAP: 0.4 | 30 days supply | Qty: 30 | Fill #2

## 2017-07-11 MED FILL — AMLODIPINE BESYLATE 10 MG T: 10 | 90 days supply | Qty: 90 | Fill #3

## 2017-07-11 MED FILL — LOSARTAN-HCTZ 100-25 MG TAB: 100-25 | 90 days supply | Qty: 90 | Fill #3

## 2017-07-15 ENCOUNTER — Other Ambulatory Visit: Payer: Self-pay

## 2017-07-15 ENCOUNTER — Ambulatory Visit: Payer: Self-pay | Attending: Physician Assistant | Admitting: Physical Therapy

## 2017-07-15 ENCOUNTER — Encounter: Payer: Self-pay | Admitting: Physical Therapy

## 2017-07-15 DIAGNOSIS — G8929 Other chronic pain: Secondary | ICD-10-CM | POA: Insufficient documentation

## 2017-07-15 DIAGNOSIS — M545 Low back pain: Secondary | ICD-10-CM | POA: Insufficient documentation

## 2017-07-15 NOTE — Therapy (Signed)
[facility] [place], [place], [address] Phone: [phone]   Fax:  [phone]  Physical Therapy No Visit.   Patient Details  Name: [name] MRN: [id number] Date of Birth: 17-Sep-1960 No Data Recorded  Encounter Date: 07/15/2017  PT End of Session - 07/15/17 1552    Visit Number  --    Number of Visits  --    Date for PT Re-Evaluation  08/26/17    Authorization Type  Workers comp     Activity Tolerance  Patient tolerated treatment well    Behavior During Therapy  [facility] for tasks assessed/performed       Past Medical History:  Diagnosis Date  . Alcohol abuse   . Anxiety   . Arthritis   . Diverticulosis of colon (without mention of hemorrhage)   . GERD (gastroesophageal reflux disease)   . Hyperlipemia   . Hypertension    dr Ronalee Belts  norins  . Kidney stones   . Nephrolithiasis   . Pancreatitis    Hx of   . Ureteral stent displacement ([place]) 1990    Past Surgical History:  Procedure Laterality Date  . A2 pulley flexor sheath cyst 4th finger excisional biopsy     '03  . ANTERIOR CRUCIATE LIGAMENT REPAIR     R knee  . ESOPHAGOGASTRODUODENOSCOPY N/A 07/09/2012   Procedure: ESOPHAGOGASTRODUODENOSCOPY (EGD);  Surgeon: Inda Castle, MD;  Location: [facility];  Service: Endoscopy;  Laterality: N/A;  . KNEE SURGERY  2008  . SHOULDER ARTHROSCOPY WITH SUBACROMIAL DECOMPRESSION AND OPEN ROTATOR C Right 12/26/2012   Procedure: RIGHT SHOULDER ARTHROSCOPY WITH SUBACROMIAL DECOMPRESSION MINI OPEN ROTATOR CUFF REPAIR, OPEN BICEP TENODESIS OPEN DISTAL CLAVICLE RESECTION  ;  Surgeon: Augustin Schooling, MD;  Location: [facility];  Service: Orthopedics;  Laterality: Right;  . URETEROSCOPY     and stone retreval    There were no vitals filed for this visit.   Subjective Assessment - 07/15/17 1603    Subjective  Patient has a long history of lower back pain. He has had therapy 2x in the past. He has had no benefit. Per patient  he is here today to try what sounds like ultrasound. He reports he has workers comp but therapy has no prior authrorization. The patient whises to figure out if he is covered before proceeding with therapy.                  Objective measurements completed on examination: See above findings.                           Plan - 07/15/17 1606    Clinical Impression Statement  As above patient requrests to make sure he is covered by workers comp before proceeding with Eval. He is also concerned that he has had PT 2x in the past without lasting results. He will return for treatment when coverag is cleared up.        Patient will benefit from skilled therapeutic intervention in order to improve the following deficits and impairments:     Visit Diagnosis: Chronic bilateral low back pain, with sciatica presence unspecified     Problem List Patient Active Problem List   Diagnosis Date Noted  . 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/02/2017  . Renal stone 10/08/2016  . Non-compliant behavior 12/02/2013  . Routine health maintenance 01/27/2012  . Hypertriglyceridemia 01/27/2012  . Knee pain, left 11/12/2011  .  BPH (benign prostatic hyperplasia) 11/12/2011  . GERD 08/10/2008  . Essential hypertension 04/14/2008  . Alcohol abuse 10/26/2007  . TRIGGER FINGER, LEFT MIDDLE 05/12/2007  . NEPHROLITHIASIS, HX OF 05/12/2007    Carney Living  PT DPT  07/15/2017, 4:08 PM  [facility] [address] [place], [place], [address] Phone: [phone]   Fax:  [phone]  Name: [name] MRN: [id number] Date of Birth: December 01, 1960

## 2017-07-23 MED FILL — ?OMEPRAZOLE DR 20MG CAPSULE: 20 | 30 days supply | Qty: 30 | Fill #11

## 2017-07-25 ENCOUNTER — Telehealth: Payer: Self-pay | Admitting: Physician Assistant

## 2017-07-25 NOTE — Telephone Encounter (Signed)
Please call patient back he came in looking for you.

## 2017-08-27 ENCOUNTER — Other Ambulatory Visit: Payer: Self-pay

## 2017-08-27 ENCOUNTER — Encounter (INDEPENDENT_AMBULATORY_CARE_PROVIDER_SITE_OTHER): Payer: Self-pay | Admitting: Physician Assistant

## 2017-08-27 ENCOUNTER — Ambulatory Visit (INDEPENDENT_AMBULATORY_CARE_PROVIDER_SITE_OTHER): Payer: Self-pay | Admitting: Physician Assistant

## 2017-08-27 VITALS — BP 143/95 | HR 89 | Temp 98.8°F | Ht 72.0 in | Wt 246.6 lb

## 2017-08-27 DIAGNOSIS — M48061 Spinal stenosis, lumbar region without neurogenic claudication: Secondary | ICD-10-CM

## 2017-08-27 DIAGNOSIS — G8929 Other chronic pain: Secondary | ICD-10-CM

## 2017-08-27 DIAGNOSIS — M5441 Lumbago with sciatica, right side: Secondary | ICD-10-CM

## 2017-08-27 DIAGNOSIS — M9983 Other biomechanical lesions of lumbar region: Secondary | ICD-10-CM

## 2017-08-27 DIAGNOSIS — K056 Periodontal disease, unspecified: Secondary | ICD-10-CM

## 2017-08-27 DIAGNOSIS — R196 Halitosis: Secondary | ICD-10-CM

## 2017-08-27 MED ORDER — MELOXICAM 7.5 MG PO TABS: 7.5000 mg | ORAL_TABLET | Freq: Every day | ORAL | 0 refills | Status: DC

## 2017-08-27 MED ORDER — ACETAMINOPHEN-CODEINE #3 300-30 MG PO TABS: 1.0000 | ORAL_TABLET | Freq: Three times a day (TID) | ORAL | 0 refills | Status: AC | PRN

## 2017-08-27 MED ORDER — CYCLOBENZAPRINE HCL 10 MG PO TABS: 10.0000 mg | ORAL_TABLET | Freq: Every day | ORAL | 0 refills | Status: DC

## 2017-08-27 MED ORDER — CHLORHEXIDINE GLUCONATE 0.12 % MT SOLN: 15.0000 mL | Freq: Two times a day (BID) | OROMUCOSAL | 0 refills | Status: DC

## 2017-08-27 MED ORDER — AMOXICILLIN-POT CLAVULANATE 875-125 MG PO TABS: 1.0000 | ORAL_TABLET | Freq: Two times a day (BID) | ORAL | 0 refills | Status: DC

## 2017-08-27 MED FILL — CYCLOBENZAPRINE 10 MG TAB: 10 | 30 days supply | Qty: 30 | Fill #0

## 2017-08-27 MED FILL — AMOX-CLAV 875-125 MG TABLET: 875-125 | 10 days supply | Qty: 20 | Fill #0

## 2017-08-27 MED FILL — MELOXICAM 7.5 MG TABLET: 7.5 | 30 days supply | Qty: 30 | Fill #0

## 2017-08-27 MED FILL — ACETAMINOPHEN/COD #3 TABLET: 300-30 | 15 days supply | Qty: 45 | Fill #0

## 2017-08-27 MED FILL — CHLORHEXIDINE 0.12% RINSE: 0.12 | 15 days supply | Qty: 473 | Fill #0

## 2017-08-27 NOTE — Patient Instructions (Signed)
Sciatica Sciatica is pain, numbness, weakness, or tingling along the path of the sciatic nerve. The sciatic nerve starts in the lower back and runs down the back of each leg. The nerve controls the muscles in the lower leg and in the back of the knee. It also provides feeling (sensation) to the back of the thigh, the lower leg, and the sole of the foot. Sciatica is a symptom of another medical condition that pinches or puts pressure on the sciatic nerve. Generally, sciatica only affects one side of the body. Sciatica usually goes away on its own or with treatment. In some cases, sciatica may keep coming back (recur). What are the causes? This condition is caused by pressure on the sciatic nerve, or pinching of the sciatic nerve. This may be the result of:  A disk in between the bones of the spine (vertebrae) bulging out too far (herniated disk).  Age-related changes in the spinal disks (degenerative disk disease).  A pain disorder that affects a muscle in the buttock (piriformis syndrome).  Extra bone growth (bone spur) near the sciatic nerve.  An injury or break (fracture) of the pelvis.  Pregnancy.  Tumor (rare).  What increases the risk? The following factors may make you more likely to develop this condition:  Playing sports that place pressure or stress on the spine, such as football or weight lifting.  Having poor strength and flexibility.  A history of back injury.  A history of back surgery.  Sitting for long periods of time.  Doing activities that involve repetitive bending or lifting.  Obesity.  What are the signs or symptoms? Symptoms can vary from mild to very severe, and they may include:  Any of these problems in the lower back, leg, hip, or buttock: ? Mild tingling or dull aches. ? Burning sensations. ? Sharp pains.  Numbness in the back of the calf or the sole of the foot.  Leg weakness.  Severe back pain that makes movement difficult.  These  symptoms may get worse when you cough, sneeze, or laugh, or when you sit or stand for long periods of time. Being overweight may also make symptoms worse. In some cases, symptoms may recur over time. How is this diagnosed? This condition may be diagnosed based on:  Your symptoms.  A physical exam. Your health care provider may ask you to do certain movements to check whether those movements trigger your symptoms.  You may have tests, including: ? Blood tests. ? X-rays. ? MRI. ? CT scan.  How is this treated? In many cases, this condition improves on its own, without any treatment. However, treatment may include:  Reducing or modifying physical activity during periods of pain.  Exercising and stretching to strengthen your abdomen and improve the flexibility of your spine.  Icing and applying heat to the affected area.  Medicines that help: ? To relieve pain and swelling. ? To relax your muscles.  Injections of medicines that help to relieve pain, irritation, and inflammation around the sciatic nerve (steroids).  Surgery.  Follow these instructions at home: Medicines  Take over-the-counter and prescription medicines only as told by your health care provider.  Do not drive or operate heavy machinery while taking prescription pain medicine. Managing pain  If directed, apply ice to the affected area. ? Put ice in a plastic bag. ? Place a towel between your skin and the bag. ? Leave the ice on for 20 minutes, 2-3 times a day.  After icing, apply   heat to the affected area before you exercise or as often as told by your health care provider. Use the heat source that your health care provider recommends, such as a moist heat pack or a heating pad. ? Place a towel between your skin and the heat source. ? Leave the heat on for 20-30 minutes. ? Remove the heat if your skin turns bright red. This is especially important if you are unable to feel pain, heat, or cold. You may have a  greater risk of getting burned. Activity  Return to your normal activities as told by your health care provider. Ask your health care provider what activities are safe for you. ? Avoid activities that make your symptoms worse.  Take brief periods of rest throughout the day. Resting in a lying or standing position is usually better than sitting to rest. ? When you rest for longer periods, mix in some mild activity or stretching between periods of rest. This will help to prevent stiffness and pain. ? Avoid sitting for long periods of time without moving. Get up and move around at least one time each hour.  Exercise and stretch regularly, as told by your health care provider.  Do not lift anything that is heavier than 10 lb (4.5 kg) while you have symptoms of sciatica. When you do not have symptoms, you should still avoid heavy lifting, especially repetitive heavy lifting.  When you lift objects, always use proper lifting technique, which includes: ? Bending your knees. ? Keeping the load close to your body. ? Avoiding twisting. General instructions  Use good posture. ? Avoid leaning forward while sitting. ? Avoid hunching over while standing.  Maintain a healthy weight. Excess weight puts extra stress on your back and makes it difficult to maintain good posture.  Wear supportive, comfortable shoes. Avoid wearing high heels.  Avoid sleeping on a mattress that is too soft or too hard. A mattress that is firm enough to support your back when you sleep may help to reduce your pain.  Keep all follow-up visits as told by your health care provider. This is important. Contact a health care provider if:  You have pain that wakes you up when you are sleeping.  You have pain that gets worse when you lie down.  Your pain is worse than you have experienced in the past.  Your pain lasts longer than 4 weeks.  You experience unexplained weight loss. Get help right away if:  You lose control  of your bowel or bladder (incontinence).  You have: ? Weakness in your lower back, pelvis, buttocks, or legs that gets worse. ? Redness or swelling of your back. ? A burning sensation when you urinate. This information is not intended to replace advice given to you by your health care provider. Make sure you discuss any questions you have with your health care provider. Document Released: 04/17/2001 Document Revised: 09/27/2015 Document Reviewed: 12/31/2014 Elsevier Interactive Patient Education  2018 Elsevier Inc.  

## 2017-08-27 NOTE — Progress Notes (Signed)
Subjective:  Patient ID: [name], male    DOB: 1960-11-12  Age: 57 y.o. MRN: [id number]  CC:  F/u back pain  HPI [name] a 57 y.o.malewith a PMH of alcohol abuse, anxiety, GERD, HTN, and renal stonespresents to f/u on foraminal stenosis of lumbar region and chronic right sided LBP with right sided sciatica. Went to physical therapy once but stopped going because he was told he needed to pay upfront. However, he has since received approval of CAFA at 100%. Reports back pain is "worse than it ever has been". Continues with radiculopathy to RLE. Ran out of NSAID, muscle relaxer, and percocet since approximately two months ago. No saddle paresthesia, urinary incontinence, or fecal incontinence.      Outpatient Medications Prior to Visit  Medication Sig Dispense Refill  . amLODipine (NORVASC) 10 MG tablet Take 1 tablet (10 mg total) by mouth daily. 90 tablet 3  . carbamide peroxide (DEBROX) 6.5 % otic solution Place 5 drops into both ears 2 (two) times daily. 15 mL 0  . carvedilol (COREG) 12.5 MG tablet Take 1 tablet (12.5 mg total) by mouth 2 (two) times daily with a meal. 180 tablet 3  . cyclobenzaprine (FLEXERIL) 10 MG tablet Take 1 tablet (10 mg total) by mouth at bedtime. 30 tablet 0  . esomeprazole (NEXIUM) 40 MG capsule Take 1 capsule (40 mg total) by mouth daily. 30 capsule 2  . gabapentin (NEURONTIN) 300 MG capsule Take 2 capsules (600 mg total) by mouth 3 (three) times daily. 180 capsule 5  . hydrOXYzine (ATARAX/VISTARIL) 25 MG tablet Take 1 tablet (25 mg total) by mouth at bedtime. 30 tablet 2  . losartan-hydrochlorothiazide (HYZAAR) 100-25 MG tablet Take 1 tablet by mouth daily. 90 tablet 3  . Melatonin 5 MG TABS Take 1 tablet (5 mg total) by mouth daily. 30 tablet 2  . Multiple Vitamins-Minerals (MULTIVITAMIN WITH MINERALS) tablet Take 1 tablet by mouth daily.    . polycarbophil (FIBERCON) 625 MG tablet Take 625 mg by mouth daily.     No facility-administered  medications prior to visit.      ROS Review of Systems  Constitutional: Negative for chills, fever and malaise/fatigue.  Eyes: Negative for blurred vision.  Respiratory: Negative for shortness of breath.   Cardiovascular: Negative for chest pain and palpitations.  Gastrointestinal: Negative for abdominal pain and nausea.  Genitourinary: Negative for dysuria and hematuria.  Musculoskeletal: Positive for back pain. Negative for joint pain and myalgias.  Skin: Negative for rash.  Neurological: Negative for tingling and headaches.  Psychiatric/Behavioral: Negative for depression. The patient is not nervous/anxious.     Objective:  There were no vitals taken for this visit.  BP/Weight 07/02/2017 05/22/2017 67/34/1937  Systolic BP 902 409 735  Diastolic BP 82 77 94  Wt. (Lbs) 242 244.8 242.8  BMI 32.82 33.2 32.93      Physical Exam  Constitutional: He is oriented to person, place, and time.  Well developed, well nourished, NAD, polite  HENT:  Head: Normocephalic and atraumatic.  Poor dentition with periodontal disease. Halitosis. No abscess or swelling of the oropharynx.   Eyes: No scleral icterus.  Neck: Normal range of motion. Neck supple. No thyromegaly present.  Cardiovascular: Normal rate, regular rhythm and normal heart sounds.  Pulmonary/Chest: Effort normal and breath sounds normal.  Musculoskeletal: He exhibits no edema.  Back flexion 45 degrees, extension 5 degrees, rotation 5-10 degrees. Spasm of the lower t spine paraspinals.  Neurological: He is alert  and oriented to person, place, and time.  Skin: Skin is warm and dry. No rash noted. No erythema. No pallor.  Psychiatric: He has a normal mood and affect. His behavior is normal. Thought content normal.  Vitals reviewed.    Assessment & Plan:   1. Foraminal stenosis of lumbar region - acetaminophen-codeine (TYLENOL #3) 300-30 MG tablet; Take 1 tablet by mouth every 8 (eight) hours as needed for up to 15 days for  moderate pain.  Dispense: 45 tablet; Refill: 0 - cyclobenzaprine (FLEXERIL) 10 MG tablet; Take 1 tablet (10 mg total) by mouth at bedtime.  Dispense: 30 tablet; Refill: 0 - meloxicam (MOBIC) 7.5 MG tablet; Take 1 tablet (7.5 mg total) by mouth daily.  Dispense: 30 tablet; Refill: 0  2. Chronic right-sided low back pain with right-sided sciatica - acetaminophen-codeine (TYLENOL #3) 300-30 MG tablet; Take 1 tablet by mouth every 8 (eight) hours as needed for up to 15 days for moderate pain.  Dispense: 45 tablet; Refill: 0 - cyclobenzaprine (FLEXERIL) 10 MG tablet; Take 1 tablet (10 mg total) by mouth at bedtime.  Dispense: 30 tablet; Refill: 0 - meloxicam (MOBIC) 7.5 MG tablet; Take 1 tablet (7.5 mg total) by mouth daily.  Dispense: 30 tablet; Refill: 0  3. Halitosis - Likely 2/2 poor dentition. - amoxicillin-clavulanate (AUGMENTIN) 875-125 MG tablet; Take 1 tablet by mouth 2 (two) times daily.  Dispense: 20 tablet; Refill: 0 - chlorhexidine (PERIDEX) 0.12 % solution; Use as directed 15 mLs in the mouth or throat 2 (two) times daily.  Dispense: 473 mL; Refill: 0  4. Periodontal disease - Gave patient brochure to [employer]. Needs to obtain [facility] Card first. - amoxicillin-clavulanate (AUGMENTIN) 875-125 MG tablet; Take 1 tablet by mouth 2 (two) times daily.  Dispense: 20 tablet; Refill: 0 - chlorhexidine (PERIDEX) 0.12 % solution; Use as directed 15 mLs in the mouth or throat 2 (two) times daily.  Dispense: 473 mL; Refill: 0   Meds ordered this encounter  Medications  . acetaminophen-codeine (TYLENOL #3) 300-30 MG tablet    Sig: Take 1 tablet by mouth every 8 (eight) hours as needed for up to 15 days for moderate pain.    Dispense:  45 tablet    Refill:  0    Order Specific Question:   Supervising Provider    Answer:   Tresa Garter [id number]  . DISCONTD: chlorhexidine (PERIDEX) 0.12 % solution    Sig: Use as directed 15 mLs in the mouth or throat 2 (two) times  daily.    Dispense:  120 mL    Refill:  0    Order Specific Question:   Supervising Provider    Answer:   Tresa Garter [id number]  . amoxicillin-clavulanate (AUGMENTIN) 875-125 MG tablet    Sig: Take 1 tablet by mouth 2 (two) times daily.    Dispense:  20 tablet    Refill:  0    Order Specific Question:   Supervising Provider    Answer:   Tresa Garter [id number]  . cyclobenzaprine (FLEXERIL) 10 MG tablet    Sig: Take 1 tablet (10 mg total) by mouth at bedtime.    Dispense:  30 tablet    Refill:  0    Order Specific Question:   Supervising Provider    Answer:   Tresa Garter [id number]  . meloxicam (MOBIC) 7.5 MG tablet    Sig: Take 1 tablet (7.5 mg total) by mouth daily.    Dispense:  30 tablet    Refill:  0    Order Specific Question:   Supervising Provider    Answer:   Tresa Garter [id number]  . chlorhexidine (PERIDEX) 0.12 % solution    Sig: Use as directed 15 mLs in the mouth or throat 2 (two) times daily.    Dispense:  473 mL    Refill:  0    Order Specific Question:   Supervising Provider    Answer:   Tresa Garter [[id number]]    Follow-up: Return in about 3 months (around 11/26/2017) for back pain.   Clent Demark PA

## 2017-09-12 ENCOUNTER — Other Ambulatory Visit (INDEPENDENT_AMBULATORY_CARE_PROVIDER_SITE_OTHER): Payer: Self-pay | Admitting: Physician Assistant

## 2017-09-12 MED FILL — ?CARVEDILOL 12.5 MG TABLET: 12.5 | 90 days supply | Qty: 180 | Fill #1

## 2017-09-12 NOTE — Telephone Encounter (Signed)
FWD to covering Provider. Nat Christen, CMA

## 2017-09-17 MED FILL — ?OMEPRAZOLE DR 20MG CAPSULE: 20 | 30 days supply | Qty: 30 | Fill #0

## 2017-10-01 MED FILL — POTASSIUM CITRATE ER 10 MEQ: 10 MEQ | 30 days supply | Qty: 120 | Fill #2

## 2017-10-09 MED FILL — AMLODIPINE BESYLATE 10 MG T: 10 | 90 days supply | Qty: 90 | Fill #4

## 2017-10-09 MED FILL — LOSARTAN-HCTZ 100-25 MG TAB: 100-25 | 90 days supply | Qty: 90 | Fill #4

## 2017-10-22 ENCOUNTER — Ambulatory Visit (HOSPITAL_COMMUNITY)
Admission: EM | Admit: 2017-10-22 | Discharge: 2017-10-22 | Disposition: A | Discharge status: Home / Self Care | Payer: Self-pay | Attending: Internal Medicine | Admitting: Internal Medicine

## 2017-10-22 ENCOUNTER — Encounter (HOSPITAL_COMMUNITY): Payer: Self-pay | Admitting: Emergency Medicine

## 2017-10-22 ENCOUNTER — Ambulatory Visit (INDEPENDENT_AMBULATORY_CARE_PROVIDER_SITE_OTHER): Payer: Self-pay

## 2017-10-22 DIAGNOSIS — R0781 Pleurodynia: Secondary | ICD-10-CM

## 2017-10-22 MED ORDER — NAPROXEN 500 MG PO TABS: 500.0000 mg | ORAL_TABLET | Freq: Two times a day (BID) | ORAL | 0 refills | Status: DC

## 2017-10-22 MED FILL — ?NAPROXEN 500MG TABLET: 500 | 10 days supply | Qty: 20 | Fill #0

## 2017-10-22 NOTE — Discharge Instructions (Signed)
Please use incentive spirometer as provided. Naproxen twice a day, take with food.  May apply ice to the area affected.  Please follow up with your primary care provider in the next week for a recheck. Return sooner if increased pain, shortness of breath , fevers, cough, or otherwise worsening.
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
## 2017-10-23 MED FILL — ?OMEPRazole 20mg CPDR: 20 | 30 days supply | Qty: 30 | Fill #1

## 2017-10-31 ENCOUNTER — Ambulatory Visit: Payer: Self-pay

## 2017-11-13 ENCOUNTER — Ambulatory Visit: Payer: Self-pay | Attending: Family Medicine

## 2017-12-16 ENCOUNTER — Ambulatory Visit: Payer: Self-pay

## 2017-12-17 ENCOUNTER — Ambulatory Visit: Payer: Self-pay | Attending: Physician Assistant

## 2017-12-17 ENCOUNTER — Other Ambulatory Visit (INDEPENDENT_AMBULATORY_CARE_PROVIDER_SITE_OTHER): Payer: Self-pay | Admitting: Physician Assistant

## 2017-12-17 DIAGNOSIS — I10 Essential (primary) hypertension: Secondary | ICD-10-CM

## 2017-12-17 NOTE — Telephone Encounter (Signed)
FWD to PCP. Tempestt S Roberts, CMA  

## 2017-12-18 MED FILL — ?CARVEDILOL 12.5 MG TABLET: 12.5 | 90 days supply | Qty: 180 | Fill #0

## 2017-12-20 ENCOUNTER — Other Ambulatory Visit (INDEPENDENT_AMBULATORY_CARE_PROVIDER_SITE_OTHER): Payer: Self-pay | Admitting: Physician Assistant

## 2017-12-20 ENCOUNTER — Telehealth: Payer: Self-pay

## 2017-12-20 NOTE — Telephone Encounter (Signed)
Pharmacy is requesting a refill on Tamsulosin for pt, it was last written by Dr. Irine Seal on 10/26/16 and last filled by the pharmacy on 07/10/17

## 2017-12-20 NOTE — Telephone Encounter (Signed)
Dr Jeffie Pollock can decide if he wants to refill for pt. This medication is not currently on patient's drug list.

## 2017-12-25 MED FILL — ?OMEPRazole 20mg CPDR: 20 | 30 days supply | Qty: 30 | Fill #2

## 2018-01-06 ENCOUNTER — Ambulatory Visit (INDEPENDENT_AMBULATORY_CARE_PROVIDER_SITE_OTHER): Payer: Self-pay

## 2018-01-06 ENCOUNTER — Other Ambulatory Visit: Payer: Self-pay

## 2018-01-06 ENCOUNTER — Encounter (HOSPITAL_COMMUNITY): Payer: Self-pay | Admitting: Emergency Medicine

## 2018-01-06 ENCOUNTER — Ambulatory Visit (HOSPITAL_COMMUNITY)
Admission: EM | Admit: 2018-01-06 | Discharge: 2018-01-06 | Disposition: A | Discharge status: Home / Self Care | Payer: Self-pay | Attending: Family Medicine | Admitting: Family Medicine

## 2018-01-06 DIAGNOSIS — W1842XA Slipping, tripping and stumbling without falling due to stepping into hole or opening, initial encounter: Secondary | ICD-10-CM

## 2018-01-06 DIAGNOSIS — R2241 Localized swelling, mass and lump, right lower limb: Secondary | ICD-10-CM

## 2018-01-06 DIAGNOSIS — M79671 Pain in right foot: Secondary | ICD-10-CM

## 2018-01-06 DIAGNOSIS — S93601A Unspecified sprain of right foot, initial encounter: Secondary | ICD-10-CM

## 2018-01-06 MED ORDER — IBUPROFEN 800 MG PO TABS: 800.0000 mg | ORAL_TABLET | Freq: Three times a day (TID) | ORAL | 0 refills | Status: DC

## 2018-01-06 NOTE — ED Triage Notes (Signed)
Right foot pain.  Saturday night, patient was running around the yard, stepped in a hole.  Patient denies having issues walking that night, but the next morning he could not walk.  Right foot swelling and pain present.  Patient dp in right foot is 2+.  Patient is able to wiggle toes.

## 2018-01-06 NOTE — ED Notes (Signed)
Given free post op boot

## 2018-01-06 NOTE — Discharge Instructions (Addendum)
Use anti-inflammatories for pain/swelling. You may take up to 800 mg Ibuprofen every 8 hours with food. You may supplement Ibuprofen with Tylenol [phone] mg every 8 hours.   Ice and elevate foot  Weight bear as tolerated- slowly transition to weight bearing as pain improving  Ace wrap for compression to help with swelling  Please follow-up if pain not improving as expected over the next 1 to 2 weeks, developing numbness or tingling, worsening pain

## 2018-01-07 MED FILL — IBUPROFEN 800 MG TABLET: 800 | 7 days supply | Qty: 21 | Fill #0

## 2018-01-08 NOTE — ED Provider Notes (Signed)
[place]    CSN: [id number] Arrival date & time: 01/06/18  1652     History   Chief Complaint Chief Complaint  Patient presents with  . Foot Pain    HPI [name] is a 57 y.o. male history of hypertension, hyperlipidemia, BPH presenting today for evaluation of right foot pain.  Patient states that Saturday evening he was running in the yard and stepped in hole and rolled his ankle.  Since he has had pain mainly in his foot and swelling.  Pain with weightbearing, using crutches from home.  Notes that he is able to wiggle his toes, but has pain with this.  He has been taking Tylenol and ibuprofen.    HPI  Past Medical History:  Diagnosis Date  . Alcohol abuse   . Anxiety   . Arthritis   . Diverticulosis of colon (without mention of hemorrhage)   . GERD (gastroesophageal reflux disease)   . Hyperlipemia   . Hypertension    dr Ronalee Belts  norins  . Kidney stones   . Nephrolithiasis   . Pancreatitis    Hx of   . 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/02/2017  . Renal stone 10/08/2016  . Non-compliant behavior 12/02/2013  . Routine health maintenance 01/27/2012  . Hypertriglyceridemia 01/27/2012  . Knee pain, left 11/12/2011  . BPH (benign prostatic hyperplasia) 11/12/2011  . GERD 08/10/2008  . Essential hypertension 04/14/2008  . Alcohol abuse 10/26/2007  . TRIGGER FINGER, LEFT MIDDLE 05/12/2007  . NEPHROLITHIASIS, HX OF 05/12/2007    Past Surgical History:  Procedure Laterality Date  . A2 pulley flexor sheath cyst 4th finger excisional biopsy     '03  . ANTERIOR CRUCIATE LIGAMENT REPAIR     R knee  . ESOPHAGOGASTRODUODENOSCOPY N/A 07/09/2012   Procedure: ESOPHAGOGASTRODUODENOSCOPY (EGD);  Surgeon: Inda Castle, MD;  Location: [facility];  Service: Endoscopy;  Laterality: N/A;  . KNEE SURGERY  2008  . SHOULDER ARTHROSCOPY WITH SUBACROMIAL  DECOMPRESSION AND OPEN ROTATOR C Right 12/26/2012   Procedure: RIGHT SHOULDER ARTHROSCOPY WITH SUBACROMIAL DECOMPRESSION MINI OPEN ROTATOR CUFF REPAIR, OPEN BICEP TENODESIS OPEN DISTAL CLAVICLE RESECTION  ;  Surgeon: Augustin Schooling, MD;  Location: [facility];  Service: Orthopedics;  Laterality: Right;  . URETEROSCOPY     and stone retreval       Home Medications    Prior to Admission medications   Medication Sig Start Date End Date Taking? Authorizing Provider  amLODipine (NORVASC) 10 MG tablet Take 1 tablet (10 mg total) by mouth daily. 11/14/16   Clent Demark, PA-C  carvedilol (COREG) 12.5 MG tablet TAKE 1 TABLET BY MOUTH TWICE DAILY A MEAL 12/17/17   Clent Demark, PA-C  chlorhexidine (PERIDEX) 0.12 % solution Use as directed 15 mLs in the mouth or throat 2 (two) times daily. 08/27/17   Clent Demark, PA-C  cyclobenzaprine (FLEXERIL) 10 MG tablet Take 1 tablet (10 mg total) by mouth at bedtime. 08/27/17   Clent Demark, PA-C  gabapentin (NEURONTIN) 300 MG capsule Take 2 capsules (600 mg total) by mouth 3 (three) times daily. 07/02/17   Clent Demark, PA-C  hydrOXYzine (ATARAX/VISTARIL) 25 MG tablet Take 1 tablet (25 mg total) by mouth at bedtime. 04/24/17   Clent Demark, PA-C  ibuprofen (ADVIL,MOTRIN) 800 MG tablet Take 1 tablet (800 mg total) by mouth 3 (three) times  daily. 01/06/18   Jaimeson Gopal C, PA-C  losartan-hydrochlorothiazide (HYZAAR) 100-25 MG tablet Take 1 tablet by mouth daily. 11/14/16   Clent Demark, PA-C  Melatonin 5 MG TABS Take 1 tablet (5 mg total) by mouth daily. 04/24/17   Clent Demark, PA-C  Multiple Vitamins-Minerals (MULTIVITAMIN WITH MINERALS) tablet Take 1 tablet by mouth daily.    [provider]  naproxen (NAPROSYN) 500 MG tablet Take 1 tablet (500 mg total) by mouth 2 (two) times daily. 10/22/17   Zigmund Gottron, NP  omeprazole (PRILOSEC) 20 MG capsule TAKE 1 CAPSULE BY MOUTH DAILY. 09/16/17   Gildardo Pounds, NP    polycarbophil (FIBERCON) 625 MG tablet Take 625 mg by mouth daily.    [provider]    Family History Family History  Problem Relation Age of Onset  . Hypertension Father        X4 siblings  . Heart disease Father   . Diabetes Mother        x2 brothers    Social History Social History   Tobacco Use  . Smoking status: Former Smoker    Types: Cigarettes    Last attempt to quit: 07/10/1998    Years since quitting: 19.5  . Smokeless tobacco: Never Used  Substance Use Topics  . Alcohol use: Yes    Comment: daily  . Drug use: No     Allergies   Hydrocodone-acetaminophen; Shrimp [shellfish allergy]; and Vicodin [hydrocodone-acetaminophen]   Review of Systems Review of Systems  Constitutional: Negative for fatigue and fever.  Eyes: Negative for redness, itching and visual disturbance.  Respiratory: Negative for shortness of breath.   Cardiovascular: Negative for chest pain and leg swelling.  Gastrointestinal: Negative for nausea and vomiting.  Musculoskeletal: Positive for arthralgias, gait problem, joint swelling and myalgias.  Skin: Negative for color change, rash and wound.  Neurological: Negative for dizziness, syncope, weakness, light-headedness and headaches.     Physical Exam Triage Vital Signs ED Triage Vitals  Enc Vitals Group     BP 01/06/18 1743 116/74     Pulse Rate 01/06/18 1743 95     Resp 01/06/18 1743 18     Temp 01/06/18 1743 98.8 F (37.1 C)     Temp Source 01/06/18 1743 Oral     SpO2 01/06/18 1743 97 %     Weight --      Height --      Head Circumference --      Peak Flow --      Pain Score 01/06/18 1737 10     Pain Loc --      Pain Edu? --      Excl. in [place]? --    No data found.  Updated Vital Signs BP 116/74 (BP Location: Left Arm)   Pulse 95   Temp 98.8 F (37.1 C) (Oral)   Resp 18   SpO2 97%   Visual Acuity Right Eye Distance:   Left Eye Distance:   Bilateral Distance:    Right Eye Near:   Left Eye Near:     Bilateral Near:     Physical Exam  Constitutional: He is oriented to person, place, and time. He appears well-developed and well-nourished.  No acute distress  HENT:  Head: Normocephalic and atraumatic.  Nose: Nose normal.  Eyes: Conjunctivae are normal.  Neck: Neck supple.  Cardiovascular: Normal rate.  Pulmonary/Chest: Effort normal. No respiratory distress.  Abdominal: He exhibits no distension.  Musculoskeletal: Normal range of motion.  Right foot: Swelling overlying the dorsal surface of foot, tenderness to palpation patient throughout first through fifth metatarsals, nontender to palpation to medial lateral malleolus, dorsalis pedis 2+  Neurological: He is alert and oriented to person, place, and time.  Skin: Skin is warm and dry.  Psychiatric: He has a normal mood and affect.  Nursing note and vitals reviewed.    UC Treatments / Results  Labs (all labs ordered are listed, but only abnormal results are displayed) Labs Reviewed - No data to display  EKG None  Radiology Dg Foot Complete Right  Result Date: 01/06/2018 CLINICAL DATA:  Patient states that he stepped in a hole x 3 days ago, patient rolled his right foot inward pain along lateral side and diffuse soft tissue swelling. No Hx EXAM: RIGHT FOOT COMPLETE - 3+ VIEW COMPARISON:  None. FINDINGS: No fracture or dislocation of mid foot or forefoot. The phalanges are normal. The calcaneus is normal. No soft tissue abnormality. IMPRESSION: No fracture or dislocation. Electronically Signed   By: Suzy Bouchard M.D.   On: 01/06/2018 18:46    Procedures Procedures (including critical care time)  Medications Ordered in UC Medications - No data to display  Initial Impression / Assessment and Plan / UC Course  I have reviewed the triage vital signs and the nursing notes.  Pertinent labs & imaging results that were available during my care of the patient were reviewed by me and considered in my medical decision making  (see chart for details).     X-ray negative, likely sprain of foot, conservative treatment with anti-inflammatories, ice, elevation, weight-bear as tolerated, provided Ace wrap to use along with crutches and slowly transition back to weightbearing.Discussed strict return precautions. Patient verbalized understanding and is agreeable with plan.  Final Clinical Impressions(s) / UC Diagnoses   Final diagnoses:  Sprain of right foot, initial encounter     Discharge Instructions     Use anti-inflammatories for pain/swelling. You may take up to 800 mg Ibuprofen every 8 hours with food. You may supplement Ibuprofen with Tylenol [phone] mg every 8 hours.   Ice and elevate foot  Weight bear as tolerated- slowly transition to weight bearing as pain improving  Ace wrap for compression to help with swelling  Please follow-up if pain not improving as expected over the next 1 to 2 weeks, developing numbness or tingling, worsening pain   ED Prescriptions    Medication Sig Dispense Auth. Provider   ibuprofen (ADVIL,MOTRIN) 800 MG tablet Take 1 tablet (800 mg total) by mouth 3 (three) times daily. 21 tablet Felma Pfefferle, [place] C, PA-C     Controlled Substance Prescriptions [place] Controlled Substance Registry consulted? Not Applicable   Janith Lima, [place] 01/08/18 [phone]

## 2018-01-13 ENCOUNTER — Other Ambulatory Visit: Payer: Self-pay

## 2018-01-13 ENCOUNTER — Other Ambulatory Visit (INDEPENDENT_AMBULATORY_CARE_PROVIDER_SITE_OTHER): Payer: Self-pay | Admitting: Physician Assistant

## 2018-01-13 DIAGNOSIS — I10 Essential (primary) hypertension: Secondary | ICD-10-CM

## 2018-01-13 NOTE — Telephone Encounter (Signed)
FWD to PCP. Tempestt S Roberts, CMA  

## 2018-01-14 MED FILL — LOSARTAN-HCTZ 100-25 MG TAB: 100-25 | 30 days supply | Qty: 30 | Fill #0

## 2018-01-14 MED FILL — AMLODIPINE BESYLATE 10 MG T: 10 | 30 days supply | Qty: 30 | Fill #0

## 2018-01-29 MED FILL — ?OMEPRazole 20mg CPDR: 20 | 30 days supply | Qty: 30 | Fill #3

## 2018-02-11 MED FILL — LOSARTAN-HCTZ 100-25 MG TAB: 100-25 | 30 days supply | Qty: 30 | Fill #1

## 2018-02-11 MED FILL — AMLODIPINE BESYLATE 10 MG T: 10 | 30 days supply | Qty: 30 | Fill #1

## 2018-03-03 MED FILL — OMEPRAZOLE 20 MG CAP: 20 | 30 days supply | Qty: 30 | Fill #4

## 2018-03-18 MED FILL — LOSARTAN-HCTZ 100-25 MG TAB: 100-25 | 30 days supply | Qty: 30 | Fill #2

## 2018-03-18 MED FILL — AMLODIPINE BESYLATE 10 MG T: 10 | 30 days supply | Qty: 30 | Fill #2

## 2018-03-20 ENCOUNTER — Other Ambulatory Visit: Payer: Self-pay | Admitting: Orthopedic Surgery

## 2018-03-25 ENCOUNTER — Other Ambulatory Visit: Payer: Self-pay

## 2018-03-25 ENCOUNTER — Encounter (HOSPITAL_COMMUNITY): Payer: Self-pay

## 2018-03-25 ENCOUNTER — Encounter (HOSPITAL_COMMUNITY)
Admission: RE | Admit: 2018-03-25 | Discharge: 2018-03-25 | Disposition: A | Discharge status: Home / Self Care | Payer: No Typology Code available for payment source | Source: Ambulatory Visit | Attending: Orthopedic Surgery | Admitting: Orthopedic Surgery

## 2018-03-25 DIAGNOSIS — R9431 Abnormal electrocardiogram [ECG] [EKG]: Secondary | ICD-10-CM

## 2018-03-25 DIAGNOSIS — Z01818 Encounter for other preprocedural examination: Secondary | ICD-10-CM

## 2018-03-25 DIAGNOSIS — I10 Essential (primary) hypertension: Secondary | ICD-10-CM | POA: Insufficient documentation

## 2018-03-25 HISTORY — DX: Personal history of urinary calculi: Z87.442

## 2018-03-25 LAB — CBC WITH DIFFERENTIAL/PLATELET
ABS IMMATURE GRANULOCYTES: 0.05 10*3/uL (ref 0.00–0.07)
BASOS PCT: 1 %
Basophils Absolute: 0.1 10*3/uL (ref 0.0–0.1)
Eosinophils Absolute: 0.1 10*3/uL (ref 0.0–0.5)
Eosinophils Relative: 1 %
HCT: 39 % (ref 39.0–52.0)
Hemoglobin: 12.3 g/dL — ABNORMAL LOW (ref 13.0–17.0)
Immature Granulocytes: 0 %
LYMPHS ABS: 2.4 10*3/uL (ref 0.7–4.0)
Lymphocytes Relative: 21 %
MCH: 30.2 pg (ref 26.0–34.0)
MCHC: 31.5 g/dL (ref 30.0–36.0)
MCV: 95.8 fL (ref 80.0–100.0)
MONOS PCT: 8 %
Monocytes Absolute: 0.9 10*3/uL (ref 0.1–1.0)
NRBC: 0 % (ref 0.0–0.2)
Neutro Abs: 7.7 10*3/uL (ref 1.7–7.7)
Neutrophils Relative %: 69 %
Platelets: 258 10*3/uL (ref 150–400)
RBC: 4.07 MIL/uL — ABNORMAL LOW (ref 4.22–5.81)
RDW: 12.2 % (ref 11.5–15.5)
WBC: 11.3 10*3/uL — ABNORMAL HIGH (ref 4.0–10.5)

## 2018-03-25 LAB — TYPE AND SCREEN
ABO/RH(D): AB POS
ANTIBODY SCREEN: NEGATIVE

## 2018-03-25 LAB — COMPREHENSIVE METABOLIC PANEL
ALT: 71 U/L — ABNORMAL HIGH (ref 0–44)
AST: 94 U/L — AB (ref 15–41)
Albumin: 4.3 g/dL (ref 3.5–5.0)
Alkaline Phosphatase: 36 U/L — ABNORMAL LOW (ref 38–126)
Anion gap: 12 (ref 5–15)
BUN: 25 mg/dL — AB (ref 6–20)
CO2: 21 mmol/L — AB (ref 22–32)
CREATININE: 1.98 mg/dL — AB (ref 0.61–1.24)
Calcium: 7.4 mg/dL — ABNORMAL LOW (ref 8.9–10.3)
Chloride: 104 mmol/L (ref 98–111)
GFR calc Af Amer: 42 mL/min — ABNORMAL LOW (ref >60)
GFR, EST NON AFRICAN AMERICAN: 36 mL/min — AB (ref >60)
GLUCOSE: 134 mg/dL — AB (ref 70–99)
POTASSIUM: 3.2 mmol/L — AB (ref 3.5–5.1)
Sodium: 137 mmol/L (ref 135–145)
Total Bilirubin: 1.7 mg/dL — ABNORMAL HIGH (ref 0.3–1.2)
Total Protein: 8 g/dL (ref 6.5–8.1)

## 2018-03-25 LAB — URINALYSIS, ROUTINE W REFLEX MICROSCOPIC
Bilirubin Urine: NEGATIVE
GLUCOSE, UA: NEGATIVE mg/dL
KETONES UR: NEGATIVE mg/dL
LEUKOCYTES UA: NEGATIVE
Nitrite: NEGATIVE
PH: 5 (ref 5.0–8.0)
Protein, ur: NEGATIVE mg/dL
Specific Gravity, Urine: 1.016 (ref 1.005–1.030)

## 2018-03-25 LAB — SURGICAL PCR SCREEN
MRSA, PCR: NEGATIVE
Staphylococcus aureus: NEGATIVE

## 2018-03-25 LAB — PROTIME-INR
INR: 1.06
PROTHROMBIN TIME: 13.7 s (ref 11.4–15.2)

## 2018-03-25 LAB — ABO/RH: ABO/RH(D): AB POS

## 2018-03-25 LAB — APTT: aPTT: 30 seconds (ref 24–36)

## 2018-03-25 MED FILL — ?CARVEDILOL 12.5 MG TABLET: 12.5 | 90 days supply | Qty: 180 | Fill #1

## 2018-03-25 NOTE — Progress Notes (Signed)
   03/25/18 1334  OBSTRUCTIVE SLEEP APNEA  Have you ever been diagnosed with sleep apnea through a sleep study? No  Do you snore loudly (loud enough to be heard through closed doors)?  1  Do you often feel tired, fatigued, or sleepy during the daytime (such as falling asleep during driving or talking to someone)? 0  Has anyone observed you stop breathing during your sleep? 1  Do you have, or are you being treated for high blood pressure? 1  BMI more than 35 kg/m2? 0  Age > 50 (1-yes) 1  Neck circumference greater than:Male 16 inches or larger, Male 17inches or larger? 0  Male Gender (Yes=1) 1  Obstructive Sleep Apnea Score 5  Score 5 or greater  Results sent to PCP
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
## 2018-03-25 NOTE — Progress Notes (Signed)
Spoke with Butch Penny with Dr. Laurena Bering office regarding pt's abnormal labs and UA results. Information will be relayed to the PA per Butch Penny.

## 2018-03-25 NOTE — Pre-Procedure Instructions (Signed)
BENTON TOOKER  03/25/2018      [place], [address] [place] [address] Phone: [phone] Fax: [phone]    Your procedure is scheduled on Thursday, November 21. .  [facility] Admitting at 5:30 AM                Your surgery or procedure is scheduled for 7:30 AM   Call this number if you have problems the morning of surgery: [phone]  This is the number for the Pre- Surgical Desk.    Remember:  Do not eat or drink after midnight Wednesday, November 20.    Take these medicines the morning of surgery with A SIP OF WATER : amLODipine (NORVASC) carvedilol (COREG)  omeprazole (PRILOSEC)    STOP taking Aspirin, Aspirin Products (Goody Powder, Excedrin Migraine), Ibuprofen (Advil), Naproxen (Aleve), Vitamins and Herbal Products (ie Fish Oil)  Special instructions:   [facility]- Preparing For Surgery  Before surgery, you can play an important role. Because skin is not sterile, your skin needs to be as free of germs as possible. You can reduce the number of germs on your skin by washing with CHG (chlorahexidine gluconate) Soap before surgery.  CHG is an antiseptic cleaner which kills germs and bonds with the skin to continue killing germs even after washing.    Oral Hygiene is also important to reduce your risk of infection.  Remember - BRUSH YOUR TEETH THE MORNING OF SURGERY WITH YOUR REGULAR TOOTHPASTE  Please do not use if you have an allergy to CHG or antibacterial soaps. If your skin becomes reddened/irritated stop using the CHG.  Do not shave (including legs and underarms) for at least 48 hours prior to first CHG shower. It is OK to shave your face.  Please follow these instructions carefully.   1. Shower the NIGHT BEFORE SURGERY and the MORNING OF SURGERY with CHG.   2. If you chose to wash your hair, wash your hair first as usual with your normal shampoo.  3. After you  shampoo, wash your face and private area with the soap you use at home, then rinse your hair and body thoroughly to remove the shampoo and soap.  4. Use CHG as you would any other liquid soap. You can apply CHG directly to the skin and wash gently with a scrungie or a clean washcloth.   5. Apply the CHG Soap to your body ONLY FROM THE NECK DOWN.  Do not use on open wounds or open sores. Avoid contact with your eyes, ears, mouth and genitals (private parts).  6. Wash thoroughly, paying special attention to the area where your surgery will be performed.  7. Thoroughly rinse your body with warm water from the neck down.  8. DO NOT shower/wash with your normal soap after using and rinsing off the CHG Soap.  9. Pat yourself dry with a CLEAN TOWEL.  10. Wear CLEAN PAJAMAS to bed the night before surgery, wear comfortable clothes the morning of surgery  11. Place CLEAN SHEETS on your bed the night of your first shower and DO NOT SLEEP WITH PETS.  Day of Surgery: Shower as above  Do not apply any deodorants/lotions, powders or colognes.  Please wear clean clothes to the hospital/surgery center.   Remember to brush your teeth WITH YOUR REGULAR TOOTHPASTE.             Do not  wear jewelry, make-up or nail polish.  Do not shave 48 hours prior to surgery.  Men may shave face and neck.  Do not bring valuables to the hospital.  [facility] is not responsible for any belongings or valuables.  Contacts, dentures or bridgework may not be worn into surgery.  Leave your suitcase in the car.  After surgery it may be brought to your room.  For patients admitted to the hospital, discharge time will be determined by your treatment team.  Please read over the following fact sheets that you were given.

## 2018-03-26 NOTE — Anesthesia Preprocedure Evaluation (Addendum)
Anesthesia Evaluation   Patient awake    Reviewed: Allergy & Precautions, H&P , NPO status , Patient's Chart, lab work & pertinent test results  Airway Mallampati: II  TM Distance: >3 FB Neck ROM: Full    Dental no notable dental hx. (+) Teeth Intact, Dental Advisory Given   Pulmonary former smoker,    Pulmonary exam normal breath sounds clear to auscultation       Cardiovascular hypertension, Pt. on medications and Pt. on home beta blockers negative cardio ROS Normal cardiovascular exam Rhythm:Regular Rate:Normal   Nuclear stress 12/22/2012: Overall Impression: Normal stress nuclear study.The patient had an early marked hypertensive response to stress. There was no chest pain. There was no EKG change. This is a low risk scan. The patient's blood pressure response will need to be treated over time.  LV Ejection Fraction: 54%. LV Wall Motion: Normal Wall Motion.   Neuro/Psych Anxiety negative neurological ROS     GI/Hepatic Neg liver ROS, GERD  Medicated,  Endo/Other  negative endocrine ROS  Renal/GU Renal InsufficiencyRenal disease     Musculoskeletal  (+) Arthritis ,   Abdominal (+) + obese,   Peds  Hematology negative hematology ROS (+)   Anesthesia Other Findings   Reproductive/Obstetrics                           Lab Results  Component Value Date   CREATININE 1.98 (H) 03/25/2018   BUN 25 (H) 03/25/2018   NA 137 03/25/2018   K 3.2 (L) 03/25/2018   CL 104 03/25/2018   CO2 21 (L) 03/25/2018    Lab Results  Component Value Date   WBC 11.3 (H) 03/25/2018   HGB 12.3 (L) 03/25/2018   HCT 39.0 03/25/2018   MCV 95.8 03/25/2018   PLT 258 03/25/2018    Anesthesia Physical Anesthesia Plan  ASA: III  Anesthesia Plan: General   Post-op Pain Management:    Induction: Intravenous  PONV Risk Score and Plan: 2 and Treatment may vary due to age or medical condition,  Dexamethasone and Ondansetron  Airway Management Planned: Oral ETT  Additional Equipment:   Intra-op Plan:   Post-operative Plan: Extubation in OR  Informed Consent: I have reviewed the patients History and Physical, chart, labs and discussed the procedure including the risks, benefits and alternatives for the proposed anesthesia with the patient or authorized representative who has indicated his/her understanding and acceptance.   Dental advisory given  Plan Discussed with: CRNA  Anesthesia Plan Comments: (See PAT note 03/25/2018 by Karoline Caldwell, PA-C )       Anesthesia Quick Evaluation

## 2018-03-26 NOTE — Progress Notes (Addendum)
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 Pertinent hx includes HTN, ETOH abuse, Pancreatitis, Kidney stones, GERD, HLD, Anxiety. S/p right shoulder arthroscopy and subacromial decompression, mini open rotator cuff repair on 12/26/12.  On PAT labs pt noted to have elevated AST and ALT. This is not completely new to him, review of labs shows previous elevations. Assumption is that this is likely due to hx of ETOH abuse.  Ref. Range 05/27/2014 13:55 07/10/2016 15:02 10/05/2016 21:35 07/02/2017 15:02 03/25/2018 13:55  AST Latest Ref Range: 15 - 41 U/L 52 (H) 40 28  94 (H)  ALT Latest Ref Range: 0 - 44 U/L 68 (H) 56 (H) 24  71 (H)   Preop labs also show an elevation in creatinine. Pt has hx of kidney stones and review of previous labs shows some mild renal insufficiency.  Ref. Range 05/27/2014 13:55 07/10/2016 15:02 10/05/2016 21:35 07/02/2017 15:02 03/25/2018 13:55  Creatinine Latest Ref Range: 0.61 - 1.24 mg/dL 1.01 1.01 1.48 (H) 1.33 (H) 1.98 (H)   Discussed with Dr. Tobias Alexander. He recommended recheck labs on DOS to follow trend. If stable anticipate can proceed as planned.  VS: BP 134/86   Pulse 91   Temp 36.9 C   Resp 20   Ht 6' (1.829 m)   Wt 109.7 kg   SpO2 96%   BMI 32.81 kg/m   PROVIDERS: Clent Demark, PA-C is PCP   LABS: Elevated Creatinine and ALT/AST. Will recheck DOS. (all labs ordered are listed, but only abnormal results are displayed)  Labs Reviewed  CBC WITH DIFFERENTIAL/PLATELET - Abnormal; Notable for the following components:      Result Value   WBC 11.3 (*)    RBC 4.07 (*)    Hemoglobin 12.3 (*)    All other components within normal limits  COMPREHENSIVE METABOLIC PANEL - Abnormal; Notable for the following components:   Potassium 3.2 (*)    CO2 21 (*)    Glucose, Bld 134 (*)    BUN 25 (*)    Creatinine, Ser 1.98 (*)    Calcium 7.4 (*)    AST 94 (*)    ALT 71 (*)    Alkaline Phosphatase 36 (*)    Total Bilirubin 1.7 (*)    GFR calc non Af Amer 36 (*)    GFR calc Af Amer 42 (*)    All other components within normal limits  URINALYSIS, ROUTINE W REFLEX MICROSCOPIC - Abnormal; Notable for the following components:   APPearance CLOUDY (*)    Hgb urine dipstick MODERATE (*)    Bacteria, UA RARE (*)    All other components within normal limits  SURGICAL PCR SCREEN  APTT  PROTIME-INR  TYPE AND SCREEN  ABO/RH     EKG: 03/25/2018: Normal sinus rhythm.  Rate 85. Nonspecific T wave abnormality  CV: Nuclear stress 12/22/2012: Overall Impression:  Normal stress nuclear study. The patient had an early marked hypertensive response to stress. There was no chest pain. There was no EKG change. This is a low risk scan. The patient's blood pressure response will need  to be treated over time.  LV Ejection Fraction: 54%.  LV Wall Motion:  Normal Wall Motion.  Past Medical History:  Diagnosis Date  . Alcohol abuse   . Anxiety   . Arthritis   . Diverticulosis of colon (without mention of hemorrhage)   . GERD (gastroesophageal reflux disease)   . History of kidney stones   . Hyperlipemia   . Hypertension    dr Ronalee Belts  norins  . Kidney stones   . Nephrolithiasis   . Pancreatitis    Hx of   . Ureteral stent displacement ([place]) 1990    Past Surgical History:  Procedure Laterality Date  . A2 pulley flexor sheath cyst 4th finger excisional biopsy     '03  . ANTERIOR CRUCIATE LIGAMENT REPAIR     R knee  . ESOPHAGOGASTRODUODENOSCOPY N/A 07/09/2012   Procedure: ESOPHAGOGASTRODUODENOSCOPY (EGD);  Surgeon: Inda Castle, MD;  Location: [facility];  Service:  Endoscopy;  Laterality: N/A;  . KNEE SURGERY  2008  . SHOULDER ARTHROSCOPY WITH SUBACROMIAL DECOMPRESSION AND OPEN ROTATOR C Right 12/26/2012   Procedure: RIGHT SHOULDER ARTHROSCOPY WITH SUBACROMIAL DECOMPRESSION MINI OPEN ROTATOR CUFF REPAIR, OPEN BICEP TENODESIS OPEN DISTAL CLAVICLE RESECTION  ;  Surgeon: Augustin Schooling, MD;  Location: [facility];  Service: Orthopedics;  Laterality: Right;  . URETEROSCOPY     and stone retreval    MEDICATIONS: . acetaminophen (TYLENOL) 500 MG tablet  . amLODipine (NORVASC) 10 MG tablet  . carvedilol (COREG) 12.5 MG tablet  . chlorhexidine (PERIDEX) 0.12 % solution  . cyclobenzaprine (FLEXERIL) 10 MG tablet  . gabapentin (NEURONTIN) 300 MG capsule  . hydrOXYzine (ATARAX/VISTARIL) 25 MG tablet  . ibuprofen (ADVIL,MOTRIN) 800 MG tablet  . losartan-hydrochlorothiazide (HYZAAR) 100-25 MG tablet  . losartan-hydrochlorothiazide (HYZAAR) 100-25 MG tablet  . Melatonin 5 MG TABS  . Multiple Vitamins-Minerals (MULTIVITAMIN WITH MINERALS) tablet  . naproxen (NAPROSYN) 500 MG tablet  . omeprazole (PRILOSEC) 20 MG capsule  . polycarbophil (FIBERCON) 625 MG tablet   No current facility-administered medications for this encounter.     Wynonia Musty [facility] Short Stay Center/Anesthesiology Phone [phone] 03/26/2018 8:45 AM

## 2018-03-27 ENCOUNTER — Inpatient Hospital Stay (HOSPITAL_COMMUNITY): Payer: No Typology Code available for payment source | Admitting: Physician Assistant

## 2018-03-27 ENCOUNTER — Inpatient Hospital Stay (HOSPITAL_COMMUNITY)
Admission: RE | Admit: 2018-03-27 | Discharge: 2018-03-28 | DRG: 455 | Disposition: A | Discharge status: Home / Self Care | Payer: No Typology Code available for payment source | Source: Ambulatory Visit | Attending: Orthopedic Surgery | Admitting: Orthopedic Surgery

## 2018-03-27 ENCOUNTER — Encounter (HOSPITAL_COMMUNITY)
Admission: RE | Disposition: A | Discharge status: Home / Self Care | Payer: Self-pay | Source: Ambulatory Visit | Attending: Orthopedic Surgery

## 2018-03-27 ENCOUNTER — Other Ambulatory Visit: Payer: Self-pay

## 2018-03-27 ENCOUNTER — Inpatient Hospital Stay (HOSPITAL_COMMUNITY): Payer: No Typology Code available for payment source

## 2018-03-27 ENCOUNTER — Inpatient Hospital Stay: Payer: Self-pay

## 2018-03-27 ENCOUNTER — Encounter (HOSPITAL_COMMUNITY): Payer: Self-pay | Admitting: Urology

## 2018-03-27 DIAGNOSIS — Z885 Allergy status to narcotic agent status: Secondary | ICD-10-CM | POA: Diagnosis not present

## 2018-03-27 DIAGNOSIS — M5410 Radiculopathy, site unspecified: Secondary | ICD-10-CM | POA: Diagnosis present

## 2018-03-27 DIAGNOSIS — I10 Essential (primary) hypertension: Secondary | ICD-10-CM | POA: Diagnosis present

## 2018-03-27 DIAGNOSIS — F419 Anxiety disorder, unspecified: Secondary | ICD-10-CM | POA: Diagnosis present

## 2018-03-27 DIAGNOSIS — Z91013 Allergy to seafood: Secondary | ICD-10-CM

## 2018-03-27 DIAGNOSIS — Z87891 Personal history of nicotine dependence: Secondary | ICD-10-CM | POA: Diagnosis not present

## 2018-03-27 DIAGNOSIS — M5416 Radiculopathy, lumbar region: Secondary | ICD-10-CM | POA: Diagnosis present

## 2018-03-27 DIAGNOSIS — M4316 Spondylolisthesis, lumbar region: Secondary | ICD-10-CM | POA: Diagnosis present

## 2018-03-27 DIAGNOSIS — E785 Hyperlipidemia, unspecified: Secondary | ICD-10-CM | POA: Diagnosis present

## 2018-03-27 DIAGNOSIS — Z79899 Other long term (current) drug therapy: Secondary | ICD-10-CM | POA: Diagnosis not present

## 2018-03-27 DIAGNOSIS — M48061 Spinal stenosis, lumbar region without neurogenic claudication: Principal | ICD-10-CM | POA: Diagnosis present

## 2018-03-27 DIAGNOSIS — Z419 Encounter for procedure for purposes other than remedying health state, unspecified: Secondary | ICD-10-CM

## 2018-03-27 DIAGNOSIS — K219 Gastro-esophageal reflux disease without esophagitis: Secondary | ICD-10-CM | POA: Diagnosis present

## 2018-03-27 DIAGNOSIS — M5116 Intervertebral disc disorders with radiculopathy, lumbar region: Secondary | ICD-10-CM | POA: Diagnosis present

## 2018-03-27 HISTORY — PX: TRANSFORAMINAL LUMBAR INTERBODY FUSION (TLIF) WITH PEDICLE SCREW FIXATION 1 LEVEL: SHX6141

## 2018-03-27 LAB — COMPREHENSIVE METABOLIC PANEL
ALBUMIN: 4.1 g/dL (ref 3.5–5.0)
ALT: 90 U/L — AB (ref 0–44)
AST: 85 U/L — ABNORMAL HIGH (ref 15–41)
Alkaline Phosphatase: 40 U/L (ref 38–126)
Anion gap: 11 (ref 5–15)
BUN: 31 mg/dL — AB (ref 6–20)
CHLORIDE: 106 mmol/L (ref 98–111)
CO2: 20 mmol/L — AB (ref 22–32)
CREATININE: 1.84 mg/dL — AB (ref 0.61–1.24)
Calcium: 7.2 mg/dL — ABNORMAL LOW (ref 8.9–10.3)
GFR calc Af Amer: 46 mL/min — ABNORMAL LOW (ref >60)
GFR, EST NON AFRICAN AMERICAN: 39 mL/min — AB (ref >60)
Glucose, Bld: 124 mg/dL — ABNORMAL HIGH (ref 70–99)
Potassium: 3 mmol/L — ABNORMAL LOW (ref 3.5–5.1)
Sodium: 137 mmol/L (ref 135–145)
Total Bilirubin: 1.1 mg/dL (ref 0.3–1.2)
Total Protein: 7.3 g/dL (ref 6.5–8.1)

## 2018-03-27 SURGERY — TRANSFORAMINAL LUMBAR INTERBODY FUSION (TLIF) WITH PEDICLE SCREW FIXATION 1 LEVEL
Anesthesia: General | Laterality: Right

## 2018-03-27 MED ORDER — CHLORHEXIDINE GLUCONATE 0.12 % MT SOLN
15.0000 mL | Freq: Two times a day (BID) | OROMUCOSAL | Status: DC
  Administered 2018-03-27 – 2018-03-28 (×2): 15 mL via OROMUCOSAL
  Filled 2018-03-27 (×3): qty 15

## 2018-03-27 MED ORDER — BUPIVACAINE-EPINEPHRINE 0.25% -1:200000 IJ SOLN
INTRAMUSCULAR | Status: DC | PRN
  Administered 2018-03-27: 10 mL

## 2018-03-27 MED ORDER — HYDROCHLOROTHIAZIDE 25 MG PO TABS
25.0000 mg | ORAL_TABLET | Freq: Every day | ORAL | Status: DC
  Administered 2018-03-27 – 2018-03-28 (×2): 25 mg via ORAL
  Filled 2018-03-27 (×2): qty 1

## 2018-03-27 MED ORDER — CEFAZOLIN SODIUM-DEXTROSE 2-4 GM/100ML-% IV SOLN
INTRAVENOUS | Status: AC
  Filled 2018-03-27: qty 100

## 2018-03-27 MED ORDER — FENTANYL CITRATE (PF) 100 MCG/2ML IJ SOLN
INTRAMUSCULAR | Status: DC | PRN
  Administered 2018-03-27 (×2): 25 ug via INTRAVENOUS
  Administered 2018-03-27: 50 ug via INTRAVENOUS
  Administered 2018-03-27 (×4): 25 ug via INTRAVENOUS
  Administered 2018-03-27: 75 ug via INTRAVENOUS
  Administered 2018-03-27: 100 ug via INTRAVENOUS

## 2018-03-27 MED ORDER — BUPIVACAINE LIPOSOME 1.3 % IJ SUSP
20.0000 mL | Freq: Once | INTRAMUSCULAR | Status: DC
  Filled 2018-03-27 (×2): qty 20

## 2018-03-27 MED ORDER — LOSARTAN POTASSIUM 50 MG PO TABS
100.0000 mg | ORAL_TABLET | Freq: Every day | ORAL | Status: DC
  Administered 2018-03-27 – 2018-03-28 (×2): 100 mg via ORAL
  Filled 2018-03-27 (×2): qty 2

## 2018-03-27 MED ORDER — SENNOSIDES-DOCUSATE SODIUM 8.6-50 MG PO TABS: 1.0000 | ORAL_TABLET | Freq: Every evening | ORAL | Status: DC | PRN

## 2018-03-27 MED ORDER — ONDANSETRON HCL 4 MG/2ML IJ SOLN: 4.0000 mg | Freq: Four times a day (QID) | INTRAMUSCULAR | Status: DC | PRN

## 2018-03-27 MED ORDER — SUGAMMADEX SODIUM 200 MG/2ML IV SOLN
INTRAVENOUS | Status: DC | PRN
  Administered 2018-03-27: 219.4 mg via INTRAVENOUS

## 2018-03-27 MED ORDER — MIDAZOLAM HCL 5 MG/5ML IJ SOLN
INTRAMUSCULAR | Status: DC | PRN
  Administered 2018-03-27: 2 mg via INTRAVENOUS

## 2018-03-27 MED ORDER — ROCURONIUM BROMIDE 50 MG/5ML IV SOSY
PREFILLED_SYRINGE | INTRAVENOUS | Status: AC
  Filled 2018-03-27: qty 10

## 2018-03-27 MED ORDER — MULTI-VITAMIN/MINERALS PO TABS: 1.0000 | ORAL_TABLET | Freq: Every day | ORAL | Status: DC

## 2018-03-27 MED ORDER — ONDANSETRON HCL 4 MG/2ML IJ SOLN
INTRAMUSCULAR | Status: DC | PRN
  Administered 2018-03-27: 4 mg via INTRAVENOUS

## 2018-03-27 MED ORDER — SODIUM CHLORIDE 0.9 % IV SOLN
INTRAVENOUS | Status: DC | PRN
  Administered 2018-03-27: 20 ug/min via INTRAVENOUS

## 2018-03-27 MED ORDER — FLEET ENEMA 7-19 GM/118ML RE ENEM: 1.0000 | ENEMA | Freq: Once | RECTAL | Status: DC | PRN

## 2018-03-27 MED ORDER — ESMOLOL HCL 100 MG/10ML IV SOLN
INTRAVENOUS | Status: AC
  Filled 2018-03-27: qty 10

## 2018-03-27 MED ORDER — ONDANSETRON HCL 4 MG/2ML IJ SOLN
INTRAMUSCULAR | Status: AC
  Filled 2018-03-27: qty 2

## 2018-03-27 MED ORDER — PANTOPRAZOLE SODIUM 40 MG PO TBEC
40.0000 mg | DELAYED_RELEASE_TABLET | Freq: Every day | ORAL | Status: DC
  Administered 2018-03-28: 40 mg via ORAL
  Filled 2018-03-27: qty 1

## 2018-03-27 MED ORDER — ACETAMINOPHEN 325 MG PO TABS
650.0000 mg | ORAL_TABLET | ORAL | Status: DC | PRN
  Administered 2018-03-27: 650 mg via ORAL
  Filled 2018-03-27: qty 2

## 2018-03-27 MED ORDER — 0.9 % SODIUM CHLORIDE (POUR BTL) OPTIME
TOPICAL | Status: DC | PRN
  Administered 2018-03-27: 1000 mL

## 2018-03-27 MED ORDER — ALUM & MAG HYDROXIDE-SIMETH 200-200-20 MG/5ML PO SUSP: 30.0000 mL | Freq: Four times a day (QID) | ORAL | Status: DC | PRN

## 2018-03-27 MED ORDER — THROMBIN (RECOMBINANT) 20000 UNITS EX SOLR
CUTANEOUS | Status: AC
  Filled 2018-03-27: qty 20000

## 2018-03-27 MED ORDER — PHENOL 1.4 % MT LIQD
1.0000 | OROMUCOSAL | Status: DC | PRN
  Administered 2018-03-27: 1 via OROMUCOSAL
  Filled 2018-03-27: qty 177

## 2018-03-27 MED ORDER — POVIDONE-IODINE 7.5 % EX SOLN
Freq: Once | CUTANEOUS | Status: DC
  Filled 2018-03-27: qty 118

## 2018-03-27 MED ORDER — METHYLENE BLUE 0.5 % INJ SOLN
INTRAVENOUS | Status: AC
  Filled 2018-03-27: qty 10

## 2018-03-27 MED ORDER — FENTANYL CITRATE (PF) 250 MCG/5ML IJ SOLN
INTRAMUSCULAR | Status: AC
  Filled 2018-03-27: qty 5

## 2018-03-27 MED ORDER — PROMETHAZINE HCL 25 MG/ML IJ SOLN: 6.2500 mg | INTRAMUSCULAR | Status: DC | PRN

## 2018-03-27 MED ORDER — PROPOFOL 10 MG/ML IV BOLUS
INTRAVENOUS | Status: AC
  Filled 2018-03-27: qty 20

## 2018-03-27 MED ORDER — LIDOCAINE 2% (20 MG/ML) 5 ML SYRINGE
INTRAMUSCULAR | Status: DC | PRN
  Administered 2018-03-27: 50 mg via INTRAVENOUS
  Administered 2018-03-27: 100 mg via INTRAVENOUS

## 2018-03-27 MED ORDER — CARVEDILOL 12.5 MG PO TABS
12.5000 mg | ORAL_TABLET | Freq: Two times a day (BID) | ORAL | Status: DC
  Administered 2018-03-27 – 2018-03-28 (×2): 12.5 mg via ORAL
  Filled 2018-03-27 (×2): qty 1

## 2018-03-27 MED ORDER — DEXAMETHASONE SODIUM PHOSPHATE 10 MG/ML IJ SOLN
INTRAMUSCULAR | Status: AC
  Filled 2018-03-27: qty 1

## 2018-03-27 MED ORDER — AMLODIPINE BESYLATE 5 MG PO TABS
10.0000 mg | ORAL_TABLET | Freq: Every day | ORAL | Status: DC
  Administered 2018-03-28: 10 mg via ORAL
  Filled 2018-03-27: qty 2

## 2018-03-27 MED ORDER — BUPIVACAINE-EPINEPHRINE (PF) 0.25% -1:200000 IJ SOLN
INTRAMUSCULAR | Status: AC
  Filled 2018-03-27: qty 30

## 2018-03-27 MED ORDER — LACTATED RINGERS IV SOLN
INTRAVENOUS | Status: DC | PRN
  Administered 2018-03-27 (×2): via INTRAVENOUS

## 2018-03-27 MED ORDER — PHENYLEPHRINE 40 MCG/ML (10ML) SYRINGE FOR IV PUSH (FOR BLOOD PRESSURE SUPPORT)
PREFILLED_SYRINGE | INTRAVENOUS | Status: DC | PRN
  Administered 2018-03-27 (×2): 40 ug via INTRAVENOUS

## 2018-03-27 MED ORDER — CEFAZOLIN SODIUM-DEXTROSE 2-4 GM/100ML-% IV SOLN
2.0000 g | INTRAVENOUS | Status: AC
  Administered 2018-03-27: 2 g via INTRAVENOUS

## 2018-03-27 MED ORDER — GLYCOPYRROLATE PF 0.2 MG/ML IJ SOSY
PREFILLED_SYRINGE | INTRAMUSCULAR | Status: DC | PRN
  Administered 2018-03-27: .2 mg via INTRAVENOUS

## 2018-03-27 MED ORDER — BISACODYL 5 MG PO TBEC: 5.0000 mg | DELAYED_RELEASE_TABLET | Freq: Every day | ORAL | Status: DC | PRN

## 2018-03-27 MED ORDER — OXYCODONE-ACETAMINOPHEN 5-325 MG PO TABS
1.0000 | ORAL_TABLET | ORAL | Status: DC | PRN
  Administered 2018-03-27 – 2018-03-28 (×7): 2 via ORAL
  Filled 2018-03-27 (×7): qty 2

## 2018-03-27 MED ORDER — THROMBIN 20000 UNITS EX SOLR
CUTANEOUS | Status: DC | PRN
  Administered 2018-03-27: 20000 [IU] via TOPICAL

## 2018-03-27 MED ORDER — ACETAMINOPHEN 650 MG RE SUPP: 650.0000 mg | RECTAL | Status: DC | PRN

## 2018-03-27 MED ORDER — MENTHOL 3 MG MT LOZG: 1.0000 | LOZENGE | OROMUCOSAL | Status: DC | PRN

## 2018-03-27 MED ORDER — PROPOFOL 10 MG/ML IV BOLUS
INTRAVENOUS | Status: DC | PRN
  Administered 2018-03-27: 20 mg via INTRAVENOUS
  Administered 2018-03-27: 200 mg via INTRAVENOUS

## 2018-03-27 MED ORDER — ACETAMINOPHEN 10 MG/ML IV SOLN: 1000.0000 mg | Freq: Once | INTRAVENOUS | Status: DC | PRN

## 2018-03-27 MED ORDER — ALBUMIN HUMAN 5 % IV SOLN
INTRAVENOUS | Status: DC | PRN
  Administered 2018-03-27 (×2): via INTRAVENOUS

## 2018-03-27 MED ORDER — HYDROMORPHONE HCL 1 MG/ML IJ SOLN: 0.2500 mg | INTRAMUSCULAR | Status: DC | PRN

## 2018-03-27 MED ORDER — ZOLPIDEM TARTRATE 5 MG PO TABS: 5.0000 mg | ORAL_TABLET | Freq: Every evening | ORAL | Status: DC | PRN

## 2018-03-27 MED ORDER — MORPHINE SULFATE (PF) 2 MG/ML IV SOLN
1.0000 mg | INTRAVENOUS | Status: DC | PRN
  Administered 2018-03-27: 2 mg via INTRAVENOUS
  Filled 2018-03-27: qty 1

## 2018-03-27 MED ORDER — SODIUM CHLORIDE 0.9% FLUSH: 3.0000 mL | INTRAVENOUS | Status: DC | PRN

## 2018-03-27 MED ORDER — ROCURONIUM BROMIDE 10 MG/ML (PF) SYRINGE
PREFILLED_SYRINGE | INTRAVENOUS | Status: DC | PRN
  Administered 2018-03-27: 50 mg via INTRAVENOUS
  Administered 2018-03-27: 25 mg via INTRAVENOUS
  Administered 2018-03-27: 10 mg via INTRAVENOUS
  Administered 2018-03-27: 25 mg via INTRAVENOUS
  Administered 2018-03-27 (×2): 10 mg via INTRAVENOUS

## 2018-03-27 MED ORDER — GLYCOPYRROLATE PF 0.2 MG/ML IJ SOSY
PREFILLED_SYRINGE | INTRAMUSCULAR | Status: AC
  Filled 2018-03-27: qty 1

## 2018-03-27 MED ORDER — METHYLENE BLUE 0.5 % INJ SOLN
INTRAVENOUS | Status: DC | PRN
  Administered 2018-03-27: .02 mg via INTRADERMAL

## 2018-03-27 MED ORDER — HYDROCODONE-ACETAMINOPHEN 7.5-325 MG PO TABS: 1.0000 | ORAL_TABLET | Freq: Once | ORAL | Status: DC | PRN

## 2018-03-27 MED ORDER — ONDANSETRON HCL 4 MG PO TABS: 4.0000 mg | ORAL_TABLET | Freq: Four times a day (QID) | ORAL | Status: DC | PRN

## 2018-03-27 MED ORDER — SODIUM CHLORIDE 0.9% FLUSH: 3.0000 mL | Freq: Two times a day (BID) | INTRAVENOUS | Status: DC

## 2018-03-27 MED ORDER — LOSARTAN POTASSIUM-HCTZ 100-25 MG PO TABS: 1.0000 | ORAL_TABLET | Freq: Every day | ORAL | Status: DC

## 2018-03-27 MED ORDER — LIDOCAINE 2% (20 MG/ML) 5 ML SYRINGE
INTRAMUSCULAR | Status: AC
  Filled 2018-03-27: qty 5

## 2018-03-27 MED ORDER — POTASSIUM CHLORIDE IN NACL 20-0.9 MEQ/L-% IV SOLN: INTRAVENOUS | Status: DC

## 2018-03-27 MED ORDER — DOCUSATE SODIUM 100 MG PO CAPS
100.0000 mg | ORAL_CAPSULE | Freq: Two times a day (BID) | ORAL | Status: DC
  Administered 2018-03-27 – 2018-03-28 (×2): 100 mg via ORAL
  Filled 2018-03-27 (×2): qty 1

## 2018-03-27 MED ORDER — GABAPENTIN 300 MG PO CAPS: 300.0000 mg | ORAL_CAPSULE | Freq: Once | ORAL | Status: DC

## 2018-03-27 MED ORDER — MEPERIDINE HCL 50 MG/ML IJ SOLN: 6.2500 mg | INTRAMUSCULAR | Status: DC | PRN

## 2018-03-27 MED ORDER — ACETAMINOPHEN 500 MG PO TABS: 1000.0000 mg | ORAL_TABLET | Freq: Once | ORAL | Status: DC

## 2018-03-27 MED ORDER — DIAZEPAM 5 MG PO TABS
5.0000 mg | ORAL_TABLET | Freq: Four times a day (QID) | ORAL | Status: DC | PRN
  Administered 2018-03-27 – 2018-03-28 (×4): 5 mg via ORAL
  Filled 2018-03-27 (×4): qty 1

## 2018-03-27 MED ORDER — BUPIVACAINE LIPOSOME 1.3 % IJ SUSP
INTRAMUSCULAR | Status: DC | PRN
  Administered 2018-03-27: 20 mL

## 2018-03-27 MED ORDER — MIDAZOLAM HCL 2 MG/2ML IJ SOLN
INTRAMUSCULAR | Status: AC
  Filled 2018-03-27: qty 2

## 2018-03-27 MED ORDER — CEFAZOLIN SODIUM-DEXTROSE 2-4 GM/100ML-% IV SOLN
2.0000 g | Freq: Three times a day (TID) | INTRAVENOUS | Status: AC
  Administered 2018-03-27 (×2): 2 g via INTRAVENOUS
  Filled 2018-03-27 (×2): qty 100

## 2018-03-27 MED ORDER — SODIUM CHLORIDE 0.9 % IV SOLN: 250.0000 mL | INTRAVENOUS | Status: DC

## 2018-03-27 SURGICAL SUPPLY — 91 items
APL SKNCLS STERI-STRIP NONHPOA (GAUZE/BANDAGES/DRESSINGS) ×1
BENZOIN TINCTURE PRP APPL 2/3 (GAUZE/BANDAGES/DRESSINGS) ×3 IMPLANT
BLADE CLIPPER SURG (BLADE) IMPLANT
BONE VIVIGEN FORMABLE 10CC (Bone Implant) ×3 IMPLANT
BUR PRESCISION 1.7 ELITE (BURR) ×3 IMPLANT
BUR ROUND FLUTED 5 RND (BURR) ×2 IMPLANT
BUR ROUND FLUTED 5MM RND (BURR) ×1
BUR ROUND PRECISION 4.0 (BURR) IMPLANT
BUR ROUND PRECISION 4.0MM (BURR)
BUR SABER RD CUTTING 3.0 (BURR) IMPLANT
BUR SABER RD CUTTING 3.0MM (BURR)
CAGE CONCORDE BULLET 9X9X27 (Cage) ×2 IMPLANT
CARTRIDGE OIL MAESTRO DRILL (MISCELLANEOUS) ×1 IMPLANT
CLOSURE STERI-STRIP 1/2X4 (GAUZE/BANDAGES/DRESSINGS) ×1
CLOSURE WOUND 1/2 X4 (GAUZE/BANDAGES/DRESSINGS) ×2
CLSR STERI-STRIP ANTIMIC 1/2X4 (GAUZE/BANDAGES/DRESSINGS) ×2 IMPLANT
CONT SPEC 4OZ CLIKSEAL STRL BL (MISCELLANEOUS) ×3 IMPLANT
COVER MAYO STAND STRL (DRAPES) ×6 IMPLANT
COVER SURGICAL LIGHT HANDLE (MISCELLANEOUS) ×3 IMPLANT
COVER WAND RF STERILE (DRAPES) ×3 IMPLANT
DIFFUSER DRILL AIR PNEUMATIC (MISCELLANEOUS) ×3 IMPLANT
DRAIN CHANNEL 15F RND FF W/TCR (WOUND CARE) IMPLANT
DRAPE C-ARM 42X72 X-RAY (DRAPES) ×3 IMPLANT
DRAPE C-ARMOR (DRAPES) IMPLANT
DRAPE POUCH INSTRU U-SHP 10X18 (DRAPES) ×3 IMPLANT
DRAPE SURG 17X23 STRL (DRAPES) ×12 IMPLANT
DURAPREP 26ML APPLICATOR (WOUND CARE) ×3 IMPLANT
ELECT BLADE 4.0 EZ CLEAN MEGAD (MISCELLANEOUS) ×3
ELECT CAUTERY BLADE 6.4 (BLADE) ×3 IMPLANT
ELECT REM PT RETURN 9FT ADLT (ELECTROSURGICAL) ×3
ELECTRODE BLDE 4.0 EZ CLN MEGD (MISCELLANEOUS) ×1 IMPLANT
ELECTRODE REM PT RTRN 9FT ADLT (ELECTROSURGICAL) ×1 IMPLANT
EVACUATOR SILICONE 100CC (DRAIN) IMPLANT
FEE INTRAOP MONITOR IMPULS NCS (MISCELLANEOUS) IMPLANT
FILTER STRAW FLUID ASPIR (MISCELLANEOUS) ×3 IMPLANT
GAUZE 4X4 16PLY RFD (DISPOSABLE) ×3 IMPLANT
GAUZE SPONGE 4X4 12PLY STRL (GAUZE/BANDAGES/DRESSINGS) ×3 IMPLANT
GLOVE BIO SURGEON STRL SZ7 (GLOVE) ×3 IMPLANT
GLOVE BIO SURGEON STRL SZ8 (GLOVE) ×3 IMPLANT
GLOVE BIOGEL PI IND STRL 7.0 (GLOVE) ×1 IMPLANT
GLOVE BIOGEL PI IND STRL 8 (GLOVE) ×1 IMPLANT
GLOVE BIOGEL PI INDICATOR 7.0 (GLOVE) ×2
GLOVE BIOGEL PI INDICATOR 8 (GLOVE) ×2
GOWN STRL REUS W/ TWL LRG LVL3 (GOWN DISPOSABLE) ×2 IMPLANT
GOWN STRL REUS W/ TWL XL LVL3 (GOWN DISPOSABLE) ×1 IMPLANT
GOWN STRL REUS W/TWL LRG LVL3 (GOWN DISPOSABLE) ×6
GOWN STRL REUS W/TWL XL LVL3 (GOWN DISPOSABLE) ×3
GRAFT BNE MATRIX VG FRMBL L 10 (Bone Implant) IMPLANT
INTRAOP MONITOR FEE IMPULS NCS (MISCELLANEOUS) ×1
INTRAOP MONITOR FEE IMPULSE (MISCELLANEOUS) ×2
IV CATH 14GX2 1/4 (CATHETERS) ×3 IMPLANT
KIT BASIN OR (CUSTOM PROCEDURE TRAY) ×3 IMPLANT
KIT POSITION SURG JACKSON T1 (MISCELLANEOUS) ×3 IMPLANT
KIT TURNOVER KIT B (KITS) ×3 IMPLANT
MARKER SKIN DUAL TIP RULER LAB (MISCELLANEOUS) ×6 IMPLANT
NDL HYPO 25GX1X1/2 BEV (NEEDLE) ×1 IMPLANT
NDL SAFETY ECLIPSE 18X1.5 (NEEDLE) ×1 IMPLANT
NDL SPNL 18GX3.5 QUINCKE PK (NEEDLE) ×2 IMPLANT
NEEDLE 22X1 1/2 (OR ONLY) (NEEDLE) ×6 IMPLANT
NEEDLE HYPO 18GX1.5 SHARP (NEEDLE) ×3
NEEDLE HYPO 25GX1X1/2 BEV (NEEDLE) ×3 IMPLANT
NEEDLE SPNL 18GX3.5 QUINCKE PK (NEEDLE) ×6 IMPLANT
NS IRRIG 1000ML POUR BTL (IV SOLUTION) ×3 IMPLANT
OIL CARTRIDGE MAESTRO DRILL (MISCELLANEOUS) ×3
PACK LAMINECTOMY ORTHO (CUSTOM PROCEDURE TRAY) ×3 IMPLANT
PACK UNIVERSAL I (CUSTOM PROCEDURE TRAY) ×3 IMPLANT
PAD ARMBOARD 7.5X6 YLW CONV (MISCELLANEOUS) ×6 IMPLANT
PATTIES SURGICAL .5 X1 (DISPOSABLE) ×3 IMPLANT
PATTIES SURGICAL .5X1.5 (GAUZE/BANDAGES/DRESSINGS) ×3 IMPLANT
ROD PRE BENT EXP 40MM (Rod) ×4 IMPLANT
SCREW CORTICAL VIPER 7X40MM (Screw) ×2 IMPLANT
SCREW SET SINGLE INNER (Screw) ×8 IMPLANT
SCREW VIPER CORT FIX 6X35 (Screw) ×2 IMPLANT
SCREW VIPER CORTICAL FIX 6X40 (Screw) ×6 IMPLANT
SPONGE INTESTINAL PEANUT (DISPOSABLE) ×3 IMPLANT
SPONGE SURGIFOAM ABS GEL 100 (HEMOSTASIS) ×3 IMPLANT
STRIP CLOSURE SKIN 1/2X4 (GAUZE/BANDAGES/DRESSINGS) ×4 IMPLANT
SURGIFLO W/THROMBIN 8M KIT (HEMOSTASIS) IMPLANT
SUT MNCRL AB 4-0 PS2 18 (SUTURE) ×3 IMPLANT
SUT VIC AB 0 CT1 18XCR BRD 8 (SUTURE) ×1 IMPLANT
SUT VIC AB 0 CT1 8-18 (SUTURE) ×3
SUT VIC AB 1 CT1 18XCR BRD 8 (SUTURE) ×1 IMPLANT
SUT VIC AB 1 CT1 8-18 (SUTURE) ×3
SUT VIC AB 2-0 CT2 18 VCP726D (SUTURE) ×3 IMPLANT
SYR 20CC LL (SYRINGE) ×6 IMPLANT
SYR BULB IRRIGATION 50ML (SYRINGE) ×3 IMPLANT
SYR CONTROL 10ML LL (SYRINGE) ×6 IMPLANT
SYR TB 1ML LUER SLIP (SYRINGE) ×3 IMPLANT
TRAY FOLEY MTR SLVR 16FR STAT (SET/KITS/TRAYS/PACK) ×3 IMPLANT
WATER STERILE IRR 1000ML POUR (IV SOLUTION) ×3 IMPLANT
YANKAUER SUCT BULB TIP NO VENT (SUCTIONS) ×3 IMPLANT

## 2018-03-27 NOTE — Evaluation (Signed)
Physical Therapy Evaluation Patient Details Name: [name] MRN: [id number] DOB: 08-04-1960 Today's Date: 03/27/2018   History of Present Illness  Pt is a 57 y.o. M with significant PMH of alcohol abuse, anxiety, hyperlipidemia, hypertension, who presents s/p L4/5 decompression, right sided L4-5 transforaminal lumbar interbody fusion, left sided L4-5 posterolateral fusion  Clinical Impression  Patient is s/p above surgery resulting in the deficits listed below (see PT Problem List). Prior to admission, patient independent with mobility and ADL's. On PT evaluation, patient presenting with balance deficits, back pain, and decreased activity tolerance. Ambulating 100 feet with use of IV pole for balance. Recommend walker initially for mobility to promote stability. Educated patient on generalized walking program and activity recommendations. Patient will benefit from skilled PT to increase their independence and safety with mobility (while adhering to their precautions) to allow discharge to the venue listed below.     Follow Up Recommendations No PT follow up;Supervision for mobility/OOB    Equipment Recommendations  Rolling walker with 5" wheels;3in1 (PT)    Recommendations for Other Services       Precautions / Restrictions Precautions Precautions: Back;Fall Precaution Booklet Issued: Yes (comment) Precaution Comments: Verbally reviewed and provided written handout Required Braces or Orthoses: Spinal Brace Spinal Brace: Thoracolumbosacral orthotic Restrictions Weight Bearing Restrictions: No      Mobility  Bed Mobility Overal bed mobility: Needs Assistance Bed Mobility: Rolling;Sidelying to Sit;Sit to Sidelying Rolling: Modified independent (Device/Increase time) Sidelying to sit: Supervision     Sit to sidelying: Mod assist General bed mobility comments: cues for log roll technique. Mod assist for BLE negotiation back into bed  Transfers Overall transfer level: Needs  assistance Equipment used: None Transfers: Sit to/from Stand Sit to Stand: Min guard         General transfer comment: Min guard for safety to stand from bed  Ambulation/Gait Ambulation/Gait assistance: Min guard Gait Distance (Feet): 100 Feet Assistive device: IV Pole Gait Pattern/deviations: Step-through pattern;Decreased stride length;Decreased dorsiflexion - right;Decreased dorsiflexion - left Gait velocity: decreased Gait velocity interpretation: <1.8 ft/sec, indicate of risk for recurrent falls General Gait Details: Patient with slow cadence and moderate reliance through RUE on IV pole for balance. Noted decreased heel strike at initial contact likely secondary to tight Financial risk analyst    Modified Rankin (Stroke Patients Only)       Balance Overall balance assessment: Needs assistance Sitting-balance support: No upper extremity supported;Feet supported Sitting balance-Leahy Scale: Good     Standing balance support: No upper extremity supported;During functional activity Standing balance-Leahy Scale: Fair Standing balance comment: Patient able to statically stand to urinate with supervision                             Pertinent Vitals/Pain Pain Assessment: Faces Faces Pain Scale: Hurts even more Pain Location: incisional site with movement Pain Descriptors / Indicators: Operative site guarding;Grimacing Pain Intervention(s): Monitored during session    Home Living Family/patient expects to be discharged to:: Private residence Living Arrangements: Parent;Other relatives(Mother, granddaughter) Available Help at Discharge: Family Type of Home: House Home Access: Level entry     Home Layout: One level Home Equipment: None      Prior Function Level of Independence: Independent         Comments: On disability     Hand Dominance        Extremity/Trunk Assessment  Upper Extremity Assessment Upper  Extremity Assessment: Overall WFL for tasks assessed    Lower Extremity Assessment Lower Extremity Assessment: Overall WFL for tasks assessed    Cervical / Trunk Assessment Cervical / Trunk Assessment: Other exceptions Cervical / Trunk Exceptions: s/p spinal surgery  Communication   Communication: No difficulties  Cognition Arousal/Alertness: Awake/alert Behavior During Therapy: WFL for tasks assessed/performed Overall Cognitive Status: Within Functional Limits for tasks assessed                                        General Comments      Exercises     Assessment/Plan    PT Assessment Patient needs continued PT services  PT Problem List Decreased strength;Decreased activity tolerance;Decreased balance;Decreased mobility;Pain       PT Treatment Interventions DME instruction;Gait training;Stair training;Functional mobility training;Therapeutic exercise;Therapeutic activities;Balance training;Patient/family education    PT Goals (Current goals can be found in the Care Plan section)  Acute Rehab PT Goals Patient Stated Goal: "get some rest." PT Goal Formulation: With patient Time For Goal Achievement: 03/29/18 Potential to Achieve Goals: Good    Frequency Min 5X/week   Barriers to discharge        Co-evaluation               AM-PAC PT "6 Clicks" Daily Activity  Outcome Measure Difficulty turning over in bed (including adjusting bedclothes, sheets and blankets)?: A Little Difficulty moving from lying on back to sitting on the side of the bed? : A Lot Difficulty sitting down on and standing up from a chair with arms (e.g., wheelchair, bedside commode, etc,.)?: Unable Help needed moving to and from a bed to chair (including a wheelchair)?: A Little Help needed walking in hospital room?: A Little Help needed climbing 3-5 steps with a railing? : A Lot 6 Click Score: 14    End of Session Equipment Utilized During Treatment: Back brace Activity  Tolerance: Patient tolerated treatment well Patient left: in bed;with call bell/phone within reach Nurse Communication: Mobility status PT Visit Diagnosis: Unsteadiness on feet (R26.81);Difficulty in walking, not elsewhere classified (R26.2);Pain Pain - part of body: (back)    Time: 2637-8588 PT Time Calculation (min) (ACUTE ONLY): 30 min   Charges:   PT Evaluation $PT Eval Low Complexity: 1 Low PT Treatments $Therapeutic Activity: 8-22 mins        Ellamae Sia, PT, DPT Acute Rehabilitation Services Pager [phone] Office [phone]   Willy Eddy 03/27/2018, 4:56 PM

## 2018-03-27 NOTE — Transfer of Care (Signed)
Immediate Anesthesia Transfer of Care Note  Patient: [name]  Procedure(s) Performed: RIGHT-SIDED LUMBAR 4-5 TRANSFORAMINAL LUMBAR INTERBODY FUSION WITH INSTRUMENTATION AND ALLOGRAFT (Right )  Patient Location: PACU  Anesthesia Type:General  Level of Consciousness: awake, alert  and oriented  Airway & Oxygen Therapy: Patient Spontanous Breathing and Patient connected to face mask oxygen  Post-op Assessment: Report given to RN and Post -op Vital signs reviewed and stable  Post vital signs: Reviewed and stable  Last Vitals:  Vitals Value Taken Time  BP 125/92 03/27/2018 12:13 PM  Temp 36.5 C 03/27/2018 12:13 PM  Pulse 95 03/27/2018 12:14 PM  Resp 16 03/27/2018 12:14 PM  SpO2 92 % 03/27/2018 12:14 PM  Vitals shown include unvalidated device data.  Last Pain:  Vitals:   03/27/18 1213  PainSc: (P) 0-No pain         Complications: No apparent anesthesia complications

## 2018-03-27 NOTE — Anesthesia Procedure Notes (Signed)
Procedure Name: Intubation Performed by: Milford Cage, CRNA Pre-anesthesia Checklist: Patient identified, Emergency Drugs available, Suction available and Patient being monitored Patient Re-evaluated:Patient Re-evaluated prior to induction Oxygen Delivery Method: Circle System Utilized Preoxygenation: Pre-oxygenation with 100% oxygen Induction Type: IV induction Ventilation: Mask ventilation without difficulty and Oral airway inserted - appropriate to patient size Laryngoscope Size: Mac and 4 Grade View: Grade I Tube type: Oral Tube size: 8.0 mm Number of attempts: 2 Airway Equipment and Method: Stylet and Oral airway Placement Confirmation: ETT inserted through vocal cords under direct vision,  positive ETCO2 and breath sounds checked- equal and bilateral Secured at: 24 cm Tube secured with: Tape Dental Injury: Teeth and Oropharynx as per pre-operative assessment  Comments: VC closed on first attempt.

## 2018-03-27 NOTE — Anesthesia Postprocedure Evaluation (Signed)
Anesthesia Post Note  Patient: [name]  Procedure(s) Performed: RIGHT-SIDED LUMBAR 4-5 TRANSFORAMINAL LUMBAR INTERBODY FUSION WITH INSTRUMENTATION AND ALLOGRAFT (Right )     Patient location during evaluation: PACU Anesthesia Type: General Level of consciousness: awake and alert Pain management: pain level controlled Vital Signs Assessment: post-procedure vital signs reviewed and stable Respiratory status: spontaneous breathing, nonlabored ventilation, respiratory function stable and patient connected to nasal cannula oxygen Cardiovascular status: blood pressure returned to baseline and stable Postop Assessment: no apparent nausea or vomiting Anesthetic complications: no    Last Vitals:  Vitals:   03/27/18 1236 03/27/18 1303  BP: 121/86 130/89  Pulse: 82 78  Resp: (!) 9 16  Temp: 36.5 C 36.7 C  SpO2: 94% 94%    Last Pain:  Vitals:   03/27/18 1348  TempSrc:   PainSc: 8                  Barnet Glasgow

## 2018-03-27 NOTE — H&P (Signed)
PREOPERATIVE H&P  Chief Complaint: Right leg pain  HPI: [name] is a 57 y.o. 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  Patient has failed multiple forms of conservative care and continues to have pain (see office notes for additional details regarding the patient's full course of treatment)  Past Medical History:  Diagnosis Date  . Alcohol abuse   . Anxiety   . Arthritis   . Diverticulosis of colon (without mention of hemorrhage)   . GERD (gastroesophageal reflux disease)   . History of kidney stones   . Hyperlipemia   . Hypertension    dr Ronalee Belts  norins  . Kidney stones   . Nephrolithiasis   . Pancreatitis    Hx of   . Ureteral stent displacement ([place]) 1990   Past Surgical History:  Procedure Laterality Date  . A2 pulley flexor sheath cyst 4th finger excisional biopsy     '03  . ANTERIOR CRUCIATE LIGAMENT REPAIR     R knee  . ESOPHAGOGASTRODUODENOSCOPY N/A 07/09/2012   Procedure: ESOPHAGOGASTRODUODENOSCOPY (EGD);  Surgeon: Inda Castle, MD;  Location: [facility];  Service: Endoscopy;  Laterality: N/A;  . KNEE SURGERY  2008  . SHOULDER ARTHROSCOPY WITH SUBACROMIAL DECOMPRESSION AND OPEN ROTATOR C Right 12/26/2012   Procedure: RIGHT SHOULDER ARTHROSCOPY WITH SUBACROMIAL DECOMPRESSION MINI OPEN ROTATOR CUFF REPAIR, OPEN BICEP TENODESIS OPEN DISTAL CLAVICLE RESECTION  ;  Surgeon: Augustin Schooling, MD;  Location: [facility];  Service: Orthopedics;  Laterality: Right;  . URETEROSCOPY     and stone retreval   Social History   Socioeconomic History  . Marital status: Married    Spouse name: Not on file  . Number of children: 3  . Years of education: Not on file  . Highest education level: Not on file  Occupational History    Employer: [employer]    Comment: Estate agent   Social Needs  . Financial resource strain: Not on file  . Food  insecurity:    Worry: Not on file    Inability: Not on file  . Transportation needs:    Medical: Not on file    Non-medical: Not on file  Tobacco Use  . Smoking status: Former Smoker    Types: Cigarettes    Last attempt to quit: 07/10/1998    Years since quitting: 19.7  . Smokeless tobacco: Never Used  Substance and Sexual Activity  . Alcohol use: Yes    Alcohol/week: 6.0 standard drinks    Types: 6 Cans of beer per week    Comment: 1 6 pack a week  . Drug use: No  . Sexual activity: Yes  Lifestyle  . Physical activity:    Days per week: Not on file    Minutes per session: Not on file  . Stress: Not on file  Relationships  . Social connections:    Talks on phone: Not on file    Gets together: Not on file    Attends religious service: Not on file    Active member of club or organization: Not on file    Attends meetings of clubs or organizations: Not on file    Relationship status: Not on file  Other Topics Concern  . Not on file  Social History Narrative   HS[employer]   [employer] groundskeeper   Difficult domestic situation, Married '  14- separated '01   2 sons- '82, '91; 1 daughter '91   His niece is 13 with metastatic breast cancer (feb '12)   Family History  Problem Relation Age of Onset  . Hypertension Father        X4 siblings  . Heart disease Father   . Diabetes Mother        x2 brothers   Allergies  Allergen Reactions  . Hydrocodone-Acetaminophen Palpitations  . Shrimp [Shellfish Allergy] Other (See Comments)    Cholesterol level becomes very high.   Prior to Admission medications   Medication Sig Start Date End Date Taking? Authorizing Provider  acetaminophen (TYLENOL) 500 MG tablet Take 500 mg by mouth every 6 (six) hours as needed for moderate pain.   Yes [provider]  amLODipine (NORVASC) 10 MG tablet TAKE 1 TABLET BY MOUTH EVERY DAY Patient taking differently: Take 10 mg by mouth daily.  01/13/18  Yes Clent Demark, PA-C  carvedilol (COREG) 12.5 MG  tablet TAKE 1 TABLET BY MOUTH TWICE DAILY A MEAL Patient taking differently: Take 12.5 mg by mouth 2 (two) times daily with a meal.  12/17/17  Yes Clent Demark, PA-C  losartan-hydrochlorothiazide (HYZAAR) 100-25 MG tablet TAKE 1 TABLET BY MOUTH EVERY DAY Patient taking differently: Take 1 tablet by mouth daily.  01/13/18  Yes Clent Demark, PA-C  Melatonin 5 MG TABS Take 1 tablet (5 mg total) by mouth daily. Patient taking differently: Take 1 tablet by mouth at bedtime as needed (sleep).  04/24/17  Yes Clent Demark, PA-C  Multiple Vitamins-Minerals (MULTIVITAMIN WITH MINERALS) tablet Take 1 tablet by mouth daily.   Yes [provider]  omeprazole (PRILOSEC) 20 MG capsule TAKE 1 CAPSULE BY MOUTH DAILY. Patient taking differently: Take 20 mg by mouth daily.  09/16/17  Yes Gildardo Pounds, NP  polycarbophil (FIBERCON) 625 MG tablet Take 625 mg by mouth daily.   Yes [provider]  chlorhexidine (PERIDEX) 0.12 % solution Use as directed 15 mLs in the mouth or throat 2 (two) times daily. 08/27/17   Clent Demark, PA-C  cyclobenzaprine (FLEXERIL) 10 MG tablet Take 1 tablet (10 mg total) by mouth at bedtime. Patient not taking: Reported on 03/20/2018 08/27/17   Clent Demark, PA-C  gabapentin (NEURONTIN) 300 MG capsule Take 2 capsules (600 mg total) by mouth 3 (three) times daily. Patient not taking: Reported on 03/20/2018 07/02/17   Clent Demark, PA-C  hydrOXYzine (ATARAX/VISTARIL) 25 MG tablet Take 1 tablet (25 mg total) by mouth at bedtime. Patient not taking: Reported on 03/20/2018 04/24/17   Clent Demark, PA-C  ibuprofen (ADVIL,MOTRIN) 800 MG tablet Take 1 tablet (800 mg total) by mouth 3 (three) times daily. Patient not taking: Reported on 03/20/2018 01/06/18   Wieters, Hallie C, PA-C  losartan-hydrochlorothiazide (HYZAAR) 100-25 MG tablet Take 1 tablet by mouth daily. Patient not taking: Reported on 03/20/2018 11/14/16   Clent Demark, PA-C    naproxen (NAPROSYN) 500 MG tablet Take 1 tablet (500 mg total) by mouth 2 (two) times daily. Patient not taking: Reported on 03/20/2018 10/22/17   Augusto Gamble B, NP     All other systems have been reviewed and were otherwise negative with the exception of those mentioned in the HPI and as above.  Physical Exam: Vitals:   03/27/18 0603 03/27/18 0604  BP: (!) 143/83   Pulse: 89   Resp: 20   Temp:  98.5 F (36.9 C)  SpO2: 99%  Body mass index is 32.81 kg/m.  General: Alert, no acute distress Cardiovascular: No pedal edema Respiratory: No cyanosis, no use of accessory musculature Skin: No lesions in the area of chief complaint Neurologic: Sensation intact distally Psychiatric: Patient is competent for consent with normal mood and affect Lymphatic: No axillary or cervical lymphadenopathy  MUSCULOSKELETAL: + SLR on the right  Assessment/Plan: CHRONIC RIGHT L5 RADICULOPATHY Plan for Procedure(s): RIGHT-SIDED LUMBAR 4-5 TRANSFORAMINAL LUMBAR INTERBODY FUSION WITH INSTRUMENTATION AND ALLOGRAFT   Norva Karvonen, MD 03/27/2018 6:14 AM

## 2018-03-27 NOTE — Op Note (Signed)
MEDICAL RECORD NO.: [id number]   PHYSICIAN:  Phylliss Bob, MD      DATE OF BIRTH:  October 13, 1960   DATE OF PROCEDURE:  03/27/2018                               OPERATIVE REPORT     PREOPERATIVE DIAGNOSES: 1. Right-sided lumbar radiculopathy, chronic 2. L4-5 spinal stenosis. 3. Severe L4-5 degenerative disc disease with segmental collapse identified on the right   POSTOPERATIVE DIAGNOSES: 1. Right-sided lumbar radiculopathy, chronic 2. L4-5 spinal stenosis. 3. Severe L4-5 degenerative disc disease with segmental collapse identified on the right   PROCEDURES: 1. L4/5 decompression 2. Right-sided L4-5 transforaminal lumbar interbody fusion. 3. Left-sided L4-5 posterolateral fusion. 4. Insertion of interbody device x1 (47mm Concorde intervertebral spacer). 5. Placement of segmental posterior instrumentation L4, L5 bilaterally  6. Use of local autograft. 7. Use of morselized allograft - Vivigen 8. Intraoperative use of fluoroscopy.   SURGEON:  Phylliss Bob, MD.   ASSISTANTPricilla Holm, PA-C.   ANESTHESIA:  General endotracheal anesthesia.   COMPLICATIONS:  None.   DISPOSITION:  Stable.   ESTIMATED BLOOD LOSS:  100cc   INDICATIONS FOR SURGERY:  Briefly,  [name] is a pleasant 57 year old male who did present to me with severe and ongoing pain in the right leg and low back, w2hich had been present for many years, following a work injury.  I did feel that the symptoms were secondary to the findings noted above.   The patient failed conservative care and did wish to proceed with the procedure  noted above.   OPERATIVE DETAILS:  On  09/25/2017, the patient was brought to surgery and general endotracheal anesthesia was administered.  The patient was placed prone on a well-padded flat Jackson bed with a spinal frame.  Antibiotics were given and a time-out procedure was performed. The back was prepped and draped in the usual fashion.  A midline incision was made overlying  the L4-5 intervertebral spaces.  The fascia was incised at the midline.  The paraspinal musculature was bluntly swept laterally.  Anatomic landmarks for the pedicles were exposed. Using fluoroscopy, I did cannulate the L5 pedicles bilaterally, using a medial to lateral cortical trajectory technique.  Then, on the right side, I did cannulate the L4 and L5 pedicles, also using a medial to lateral technique.  At this point, 6 x 40 mm screws were placed into the left pedicles, and a 40 mm rod was placed into the tulip heads of the screw, and saps were also placed.  Distraction was then applied across the L4-5 intervertebral space, and the caps were then provisionally tightened.  On the right side, bone wax was placed into the cannulated pedicle holes.  I then proceeded with the decompressive aspect of the procedure at the L4-5 level.  A partial facetectomy was performed bilaterally at L4-5, decompressing the L4-5 intervertebral space.  I was very pleased with the decompression. With the assistant holding medial retraction of the traversing right L5 nerve, I did perform an annulotomy at the posterolateral aspect of the L4-5 intervertebral space.  I then used a series of curettes and pituitary rongeurs to perform a thorough and complete intervertebral diskectomy.  The intervertebral space was then liberally packed with autograft as well as allograft in the form of Vivigen, as was the appropriate-sized intervertebral spacer (9 mm, lordotic).  The spacer was then tamped into position in the usual fashion.  I was very pleased with the press-fit of the spacer.  I then placed a 6 mm screw on the right at L4 and a 7 mm screw at L5. A 40-mm rod was then placed and caps were placed. The distraction was then released on the contralateral left side.  All caps were then locked.  The wound was copiously irrigated with a total of approximately 3 L prior to placing the bone graft.  Additional autograft and allograft was  then packed into the posterolateral gutter on the left side to help aid in the success of the fusion.  The wound was  explored for any undue bleeding and there was no substantial bleeding encountered.  Gel-Foam was placed over the laminectomy site.  The wound was then closed in layers using #1 Vicryl followed by 2-0 Vicryl, followed by 4-0 Monocryl.  Benzoin and Steri-Strips were applied followed by sterile dressing.   Of note, did use triggered EMG to test the screws on the left, and there was no screw the tested below 15 mA. There was no sustained abnormal EMG activity noted throughout the surgery.   Of note, Pricilla Holm was my assistant throughout surgery, and did aid in retraction, suctioning, and closure.       Phylliss Bob, MD

## 2018-03-28 MED ORDER — MAGNESIUM CITRATE PO SOLN
1.0000 | Freq: Once | ORAL | Status: AC
  Administered 2018-03-28: 1 via ORAL
  Filled 2018-03-28: qty 296

## 2018-03-28 MED FILL — Sodium Chloride IV Soln 0.9%: INTRAVENOUS | Qty: 2000 | Status: AC

## 2018-03-28 MED FILL — Heparin Sodium (Porcine) Inj 1000 Unit/ML: INTRAMUSCULAR | Qty: 30 | Status: AC

## 2018-03-28 MED FILL — Thrombin (Recombinant) For Soln 20000 Unit: CUTANEOUS | Qty: 1 | Status: AC

## 2018-03-28 NOTE — Evaluation (Signed)
Occupational Therapy Evaluation and Discharge Patient Details Name: [name] MRN: [id number] DOB: November 11, 1960 Today's Date: 03/28/2018    History of Present Illness Pt is a 57 y.o. M with significant PMH of alcohol abuse, anxiety, hyperlipidemia, hypertension, who presents s/p L4/5 decompression, right sided L4-5 transforaminal lumbar interbody fusion, left sided L4-5 posterolateral fusion   Clinical Impression   Pt is typically independent and lives with his mother who also has back problems and 17 year old granddaughter. Pt presents with 8/10 reported pain and unsteady gait, requiring min guard assist for ambulation to bathroom with pt reaching for walls and furniture. Educated pt at length in back precautions during ADL, IADL and bed mobility with pt verbalizing understanding. No further OT needs.    Follow Up Recommendations  No OT follow up    Equipment Recommendations  3 in 1 bedside commode    Recommendations for Other Services       Precautions / Restrictions Precautions Precautions: Back;Fall Precaution Booklet Issued: Yes (comment) Precaution Comments: Verbally reviewed and provided written handout Required Braces or Orthoses: Spinal Brace Spinal Brace: Thoracolumbosacral orthotic Restrictions Weight Bearing Restrictions: No(Simultaneous filing. User may not have seen previous data.)      Mobility Bed Mobility               General bed mobility comments: pt seated at EOB upon arrival, educated in log roll technique  Transfers Overall transfer level: Needs assistance Equipment used: None Transfers: Sit to/from Stand Sit to Stand: Min guard         General transfer comment: increased time, min guard for safety    Balance Overall balance assessment: Needs assistance   Sitting balance-Leahy Scale: Good       Standing balance-Leahy Scale: Fair Standing balance comment: able to stand statically at sink for grooming                            ADL either performed or assessed with clinical judgement   ADL Overall ADL's : Needs assistance/impaired Eating/Feeding: Independent;Sitting   Grooming: Oral care;Standing;Supervision/safety Grooming Details (indicate cue type and reason): instructed in 2 cup method Upper Body Bathing: Set up;Sitting Upper Body Bathing Details (indicate cue type and reason): recommended long handled bath sponge Lower Body Bathing: Minimal assistance(standing ) Lower Body Bathing Details (indicate cue type and reason): recommended long handled bath sponge Upper Body Dressing : Set up;Sitting Upper Body Dressing Details (indicate cue type and reason): including TLSO Lower Body Dressing: Min guard;Sit to/from stand Lower Body Dressing Details (indicate cue type and reason): able to cross foot over opposite knee  Toilet Transfer: Min guard;Ambulation Toilet Transfer Details (indicate cue type and reason): educated in benefits of 3in 1   Toileting - Clothing Manipulation Details (indicate cue type and reason): instructed to avoid use twisting with pericare, use of tongs as needed     Functional mobility during ADLs: Min guard(no device, pt reaching for wall, furniture) General ADL Comments: Pt educated in back precautions during IADL, will need to rely on granddaughter for housekeeping, laundry.     Vision Patient Visual Report: No change from baseline       Perception     Praxis      Pertinent Vitals/Pain Pain Assessment: 0-10 Pain Score: 8  Pain Location: back Pain Descriptors / Indicators: Grimacing;Guarding;Sore Pain Intervention(s): Monitored during session;Patient requesting pain meds-RN notified     Hand Dominance Right   Extremity/Trunk Assessment Upper Extremity  Assessment Upper Extremity Assessment: Overall WFL for tasks assessed(hx of R shoulder surgery)   Lower Extremity Assessment Lower Extremity Assessment: Defer to PT evaluation       Communication  Communication Communication: No difficulties   Cognition Arousal/Alertness: Awake/alert Behavior During Therapy: Flat affect Overall Cognitive Status: Within Functional Limits for tasks assessed                                     General Comments       Exercises     Shoulder Instructions      Home Living Family/patient expects to be discharged to:: Private residence Living Arrangements: Parent(mother and granddaughter who is 50) Available Help at Discharge: Family Type of Home: House Home Access: Level entry     Home Layout: One level     Bathroom Shower/Tub: Teacher, early years/pre: Standard     Home Equipment: None          Prior Functioning/Environment Level of Independence: Independent        Comments: On disability        OT Problem List:        OT Treatment/Interventions:      OT Goals(Current goals can be found in the care plan section) Acute Rehab OT Goals Patient Stated Goal: "get some rest."  OT Frequency:     Barriers to D/C:            Co-evaluation              AM-PAC PT "6 Clicks" Daily Activity     Outcome Measure Help from another person eating meals?: None Help from another person taking care of personal grooming?: A Little Help from another person toileting, which includes using toliet, bedpan, or urinal?: A Little Help from another person bathing (including washing, rinsing, drying)?: A Little Help from another person to put on and taking off regular upper body clothing?: None Help from another person to put on and taking off regular lower body clothing?: A Little 6 Click Score: 20   End of Session Equipment Utilized During Treatment: Gait belt;Back brace Nurse Communication: Patient requests pain meds  Activity Tolerance: Patient tolerated treatment well Patient left: in chair;with call bell/phone within reach  OT Visit Diagnosis: Pain                Time: 0810-0839 OT Time Calculation  (min): 29 min Charges:  OT General Charges $OT Visit: 1 Visit OT Evaluation $OT Eval Low Complexity: 1 Low OT Treatments $Self Care/Home Management : 8-22 mins  Nestor Lewandowsky, OTR/L Acute Rehabilitation Services Pager: [phone] Office: [phone]  [name] 03/28/2018, 8:50 AM

## 2018-03-28 NOTE — Progress Notes (Signed)
    Patient doing well  Patient denies right leg pain + expected LBP Per nursing, patient has not been wanting to mobilize   Physical Exam: Vitals:   03/27/18 2325 03/28/18 0413  BP: 138/89 122/73  Pulse: 88 81  Resp: 18 20  Temp: 99.9 F (37.7 C) 100 F (37.8 C)  SpO2: 96% 95%    Dressing in place NVI  POD #1 s/p L4/5 decompression and fusion, doing well  - up with additional PT and OT, encourage ambulation - Percocet for pain, Valium for muscle spasms - d/c home today with f/u in 2 weeks - importance of mobility was discussed with the patient

## 2018-03-28 NOTE — Progress Notes (Signed)
Physical Therapy Treatment Patient Details Name: [name] MRN: [id number] DOB: August 10, 1960 Today's Date: 03/28/2018    History of Present Illness Pt is a 57 y.o. M with significant PMH of alcohol abuse, anxiety, hyperlipidemia, hypertension, who presents s/p L4/5 decompression, right sided L4-5 transforaminal lumbar interbody fusion, left sided L4-5 posterolateral fusion    PT Comments    Pt admitted with above diagnosis. Pt currently presents with decreased strength, poor understanding of precautions and pain limiting his ability to safely and independently perform bed mobility, transfer and amb with RW. Pt educated on maintaining precautions in bed mobility and transfers, car transfers, adjusting brace and a walking program. Pt will benefit from skilled PT to increase his independence and safety with mobility to allow discharge to home with granddaughter and mother for support. PT will follow.      Follow Up Recommendations  No PT follow up;Supervision for mobility/OOB     Equipment Recommendations  Rolling walker with 5" wheels;3in1 (PT)    Recommendations for Other Services       Precautions / Restrictions Precautions Precautions: Back;Fall Precaution Booklet Issued: Yes (comment) Precaution Comments: Pt education on don/doffing brace in sitting Required Braces or Orthoses: Spinal Brace Spinal Brace: Thoracolumbosacral orthotic Restrictions Weight Bearing Restrictions: No    Mobility  Bed Mobility Overal bed mobility: Needs Assistance Bed Mobility: Sit to Supine;Sit to Sidelying       Sit to supine: Supervision Sit to sidelying: Supervision General bed mobility comments: Pt presented in chair. Sit to sidelynig - HOB down, rails down. Pt had not practiced with rails down. Increased pain, increased time, VC to maintain precautions during supine to sidelying and scooting down in bed.   Transfers Overall transfer level: Needs assistance Equipment used: Rolling  walker (2 wheeled) Transfers: Sit to/from Stand Sit to Stand: Min guard         General transfer comment: increased time, min guard for safety  Ambulation/Gait Ambulation/Gait assistance: Min guard Gait Distance (Feet): 200 Feet Assistive device: Rolling walker (2 wheeled) Gait Pattern/deviations: Decreased stride length;Decreased dorsiflexion - left;Decreased dorsiflexion - right;Step-through pattern Gait velocity: Decreased Gait velocity interpretation: <1.8 ft/sec, indicate of risk for recurrent falls General Gait Details: Guarded, short stride length at beginning of session, patient loosened up and increase stride length at end of session. Stated pain decreased slightly during amb.   Stairs             Wheelchair Mobility    Modified Rankin (Stroke Patients Only)       Balance Overall balance assessment: Needs assistance Sitting-balance support: No upper extremity supported;Feet supported Sitting balance-Leahy Scale: Good     Standing balance support: Bilateral upper extremity supported Standing balance-Leahy Scale: Fair Standing balance comment: Stand statically for brace adjustment                            Cognition Arousal/Alertness: Awake/alert Behavior During Therapy: Flat affect Overall Cognitive Status: Within Functional Limits for tasks assessed                                 General Comments: Would not make eye contact at beginning of session, guarded, warmed up by end of session.      Exercises      General Comments        Pertinent Vitals/Pain Pain Assessment: 0-10 Pain Score: 10-Worst pain ever Pain Location: back  Pain Descriptors / Indicators: Guarding;Sore Pain Intervention(s): Patient requesting pain meds-RN notified    Home Living Family/patient expects to be discharged to:: Private residence Living Arrangements: Parent(mother and granddaughter who is 17) Available Help at Discharge: Family Type of  Home: House Home Access: Level entry   Home Layout: One level Home Equipment: None      Prior Function Level of Independence: Independent      Comments: On disability   PT Goals (current goals can now be found in the care plan section) Acute Rehab PT Goals Patient Stated Goal: take a nap PT Goal Formulation: With patient Time For Goal Achievement: 03/29/18 Potential to Achieve Goals: Good Progress towards PT goals: Progressing toward goals    Frequency    Min 5X/week      PT Plan      Co-evaluation              AM-PAC PT "6 Clicks" Daily Activity  Outcome Measure  Difficulty turning over in bed (including adjusting bedclothes, sheets and blankets)?: A Little Difficulty moving from lying on back to sitting on the side of the bed? : A Little Difficulty sitting down on and standing up from a chair with arms (e.g., wheelchair, bedside commode, etc,.)?: A Little Help needed moving to and from a bed to chair (including a wheelchair)?: A Little Help needed walking in hospital room?: A Little Help needed climbing 3-5 steps with a railing? : A Lot 6 Click Score: 17    End of Session Equipment Utilized During Treatment: Back brace;Gait belt Activity Tolerance: Patient tolerated treatment well Patient left: in bed;with call bell/phone within reach Nurse Communication: Mobility status PT Visit Diagnosis: Unsteadiness on feet (R26.81);Difficulty in walking, not elsewhere classified (R26.2);Pain Pain - part of body: (back)     Time: 0902-0929 PT Time Calculation (min) (ACUTE ONLY): 27 min  Charges:  $Gait Training: 23-37 mins                     Gilda Crease, SPT Acute Rehab Services [place] 03/28/2018, 10:21 AM

## 2018-03-28 NOTE — Care Management Note (Signed)
Case Management Note  Patient Details  Name: [name] MRN: [id number] Date of Birth: [birth year]  Subjective/Objective:  57 yr old gentleman s/p L4-L5 transforaminal Lumbar fusion, L sided L4-5 posteriolateral fusion.                Action/Plan: Case manager spoke with patient concerning discharge plan and DME. Patient is under worker's comp. CM is contacting [name], [place] [phone] to arrange for DME.    Expected Discharge Date:  03/28/18               Expected Discharge Plan:  Home/Self Care  In-House Referral:  NA  Discharge planning Services  CM Consult  Post Acute Care Choice:  Durable Medical Equipment Choice offered to:  NA(patient is under workers comp)  DME Arranged:  3-N-1, [place] rolling DME Agency:     HH Arranged:  NA [place] Agency:  NA  Status of Service:  In process, will continue to follow  If discussed at Long Length of Stay Meetings, dates discussed:    Additional Comments:  Ninfa Meeker, RN 03/28/2018, 11:04 AM

## 2018-03-28 NOTE — Progress Notes (Signed)
Patient is discharged from room 3C10 at this time. Alert and in stable condition. IV site d/c'd and instructions read to patient and family with understanding verbalized. Left unit via wheelchair with all belongings at side.

## 2018-04-01 MED FILL — OMEPRAZOLE 20 MG CAP: 20 | 30 days supply | Qty: 30 | Fill #5

## 2018-04-02 ENCOUNTER — Encounter (HOSPITAL_COMMUNITY): Payer: Self-pay | Admitting: Orthopedic Surgery

## 2018-04-11 NOTE — Discharge Summary (Signed)
Patient ID: AYODEJI KEIMIG MRN: [id number] DOB/AGE: 09-Sep-1960 57 y.o.  Admit date: 03/27/2018 Discharge date: 03/28/2018  Admission Diagnoses:  Active Problems:   Radiculopathy   Discharge Diagnoses:  Same  Past Medical History:  Diagnosis Date  . Alcohol abuse   . Anxiety   . Arthritis   . Diverticulosis of colon (without mention of hemorrhage)   . GERD (gastroesophageal reflux disease)   . History of kidney stones   . Hyperlipemia   . Hypertension    dr Ronalee Belts  norins  . Kidney stones   . Nephrolithiasis   . Pancreatitis    Hx of   . Ureteral stent displacement ([place]) 1990    Surgeries: Procedure(s): RIGHT-SIDED LUMBAR 4-5 TRANSFORAMINAL LUMBAR INTERBODY FUSION WITH INSTRUMENTATION AND ALLOGRAFT on 03/27/2018   Consultants: None  Discharged Condition: Improved  Hospital Course: KAIZEN IBSEN is an 57 y.o. male who was admitted 03/27/2018 for operative treatment of radiculopathy. Patient has severe unremitting pain that affects sleep, daily activities, and work/hobbies. After pre-op clearance the patient was taken to the operating room on 03/27/2018 and underwent  Procedure(s): RIGHT-SIDED LUMBAR 4-5 TRANSFORAMINAL LUMBAR INTERBODY FUSION WITH INSTRUMENTATION AND ALLOGRAFT.    Patient was given perioperative antibiotics:  Anti-infectives (From admission, onward)   Start     Dose/Rate Route Frequency Ordered Stop   03/27/18 1600  ceFAZolin (ANCEF) IVPB 2g/100 mL premix     2 g 200 mL/hr over 30 Minutes Intravenous Every 8 hours 03/27/18 1310 03/28/18 0005   03/27/18 0600  ceFAZolin (ANCEF) IVPB 2g/100 mL premix     2 g 200 mL/hr over 30 Minutes Intravenous On call to O.R. 03/27/18 0539 03/27/18 0810   03/27/18 0543  ceFAZolin (ANCEF) 2-4 GM/100ML-% IVPB    Note to Pharmacy:  Debbe Bales, [place]: cabinet override      03/27/18 0543 03/27/18 0810       Patient was given sequential compression devices, early ambulation to prevent DVT.  Patient benefited  maximally from hospital stay and there were no complications.    Recent vital signs: BP 118/81 (BP Location: Left Arm)   Pulse 97   Temp 99.5 F (37.5 C) (Oral)   Resp 16   Ht 6' (1.829 m)   Wt 109.7 kg   SpO2 95%   BMI 32.81 kg/m    Discharge Medications:   Allergies as of 03/28/2018      Reactions   Hydrocodone-acetaminophen Palpitations   Shrimp [shellfish Allergy] Other (See Comments)   Cholesterol level becomes very high.      Medication List    STOP taking these medications   acetaminophen 500 MG tablet Commonly known as:  TYLENOL     TAKE these medications   amLODipine 10 MG tablet Commonly known as:  NORVASC TAKE 1 TABLET BY MOUTH EVERY DAY   carvedilol 12.5 MG tablet Commonly known as:  COREG TAKE 1 TABLET BY MOUTH TWICE DAILY A MEAL What changed:  See the new instructions.   chlorhexidine 0.12 % solution Commonly known as:  PERIDEX Use as directed 15 mLs in the mouth or throat 2 (two) times daily.   cyclobenzaprine 10 MG tablet Commonly known as:  FLEXERIL Take 1 tablet (10 mg total) by mouth at bedtime.   gabapentin 300 MG capsule Commonly known as:  NEURONTIN Take 2 capsules (600 mg total) by mouth 3 (three) times daily.   hydrOXYzine 25 MG tablet Commonly known as:  ATARAX/VISTARIL Take 1 tablet (25 mg total) by mouth  at bedtime.   losartan-hydrochlorothiazide 100-25 MG tablet Commonly known as:  HYZAAR Take 1 tablet by mouth daily.   losartan-hydrochlorothiazide 100-25 MG tablet Commonly known as:  HYZAAR TAKE 1 TABLET BY MOUTH EVERY DAY   Melatonin 5 MG Tabs Take 1 tablet (5 mg total) by mouth daily. What changed:    when to take this  reasons to take this   multivitamin with minerals tablet Take 1 tablet by mouth daily.   omeprazole 20 MG capsule Commonly known as:  PRILOSEC TAKE 1 CAPSULE BY MOUTH DAILY.   polycarbophil 625 MG tablet Commonly known as:  FIBERCON Take 625 mg by mouth daily.       Diagnostic Studies:  Dg Lumbar Spine 2-3 Views  Result Date: 03/27/2018 CLINICAL DATA:  L4-5 TLIF EXAM: LUMBAR SPINE - 2-3 VIEW COMPARISON:  Lumbar spine MRI on 02/14/2018, CT of the abdomen and pelvis on 10/06/2016 FINDINGS: Patient has undergone transpedicular fusion at L4-5 with interbody fusion device. IMPRESSION: L4-5 fusion. Electronically Signed   By: Nolon Nations M.D.   On: 03/27/2018 11:50   Dg Lumbar Spine 1 View  Result Date: 03/27/2018 CLINICAL DATA:  L4-5 TLIF; Localization film for L4-5 TLIF EXAM: DG C-ARM 61-120 MIN; LUMBAR SPINE - 1 VIEW COMPARISON:  Lumbar spine MRI on 02/14/2018, CT of the abdomen and pelvis on 10/06/2016 FINDINGS: LATERAL view is performed. Surgical instrument is identified posterior to the L4 vertebral body. A second surgical instrument is identified posterior to the disc space of L2-3. IMPRESSION: Intraoperative localization. Electronically Signed   By: Nolon Nations M.D.   On: 03/27/2018 11:54   Dg C-arm 1-60 Min  Result Date: 03/27/2018 CLINICAL DATA:  L4-5 TLIF; Localization film for L4-5 TLIF EXAM: DG C-ARM 61-120 MIN; LUMBAR SPINE - 1 VIEW COMPARISON:  Lumbar spine MRI on 02/14/2018, CT of the abdomen and pelvis on 10/06/2016 FINDINGS: LATERAL view is performed. Surgical instrument is identified posterior to the L4 vertebral body. A second surgical instrument is identified posterior to the disc space of L2-3. IMPRESSION: Intraoperative localization. Electronically Signed   By: Nolon Nations M.D.   On: 03/27/2018 11:54   Dg C-arm 1-60 Min  Result Date: 03/27/2018 CLINICAL DATA:  L4-5 TLIF; Localization film for L4-5 TLIF EXAM: DG C-ARM 61-120 MIN; LUMBAR SPINE - 1 VIEW COMPARISON:  Lumbar spine MRI on 02/14/2018, CT of the abdomen and pelvis on 10/06/2016 FINDINGS: LATERAL view is performed. Surgical instrument is identified posterior to the L4 vertebral body. A second surgical instrument is identified posterior to the disc space of L2-3. IMPRESSION: Intraoperative  localization. Electronically Signed   By: Nolon Nations M.D.   On: 03/27/2018 11:54   Mr Outside Films Spine  Result Date: 03/27/2018 This examination belongs to an outside facility and is stored here for comparison purposes only.  Contact the originating outside institution for any associated report or interpretation.   Disposition:    POD #1 s/p L4/5 decompression and fusion, doing well  - up with additional PT and OT, encourage ambulation - Percocet for pain, Valium for muscle spasms - importance of mobility was discussed with the patient -Written scripts for pain signed and in chart -D/C instructions sheet printed and in chart -D/C today  -F/U in office 2 weeks   Signed: Lennie Muckle Candra Wegner 04/11/2018, 8:07 AM

## 2018-04-15 MED FILL — AMLODIPINE BESYLATE 10 MG T: 10 | 30 days supply | Qty: 30 | Fill #3

## 2018-04-28 MED FILL — LOSARTAN-HCTZ 100-25 MG TAB: 100-25 | 30 days supply | Qty: 30 | Fill #3

## 2018-04-28 MED FILL — ?OMEPRAZOLE 20 MG CAPSULE D: 20 | 30 days supply | Qty: 30 | Fill #6

## 2018-05-14 MED FILL — AMLODIPINE BESYLATE 10 MG T: 10 | 30 days supply | Qty: 30 | Fill #4

## 2018-05-19 ENCOUNTER — Emergency Department (HOSPITAL_COMMUNITY): Payer: Self-pay

## 2018-05-19 ENCOUNTER — Other Ambulatory Visit: Payer: Self-pay

## 2018-05-19 ENCOUNTER — Encounter (HOSPITAL_COMMUNITY): Payer: Self-pay | Admitting: Emergency Medicine

## 2018-05-19 ENCOUNTER — Inpatient Hospital Stay (HOSPITAL_COMMUNITY)
Admission: EM | Admit: 2018-05-19 | Discharge: 2018-05-24 | DRG: 439 | Disposition: A | Discharge status: Home / Self Care | Payer: Self-pay | Attending: Internal Medicine | Admitting: Internal Medicine

## 2018-05-19 DIAGNOSIS — Z8249 Family history of ischemic heart disease and other diseases of the circulatory system: Secondary | ICD-10-CM

## 2018-05-19 DIAGNOSIS — Z91013 Allergy to seafood: Secondary | ICD-10-CM

## 2018-05-19 DIAGNOSIS — Z885 Allergy status to narcotic agent status: Secondary | ICD-10-CM

## 2018-05-19 DIAGNOSIS — Z79899 Other long term (current) drug therapy: Secondary | ICD-10-CM

## 2018-05-19 DIAGNOSIS — Z87891 Personal history of nicotine dependence: Secondary | ICD-10-CM

## 2018-05-19 DIAGNOSIS — E872 Acidosis, unspecified: Secondary | ICD-10-CM

## 2018-05-19 DIAGNOSIS — N400 Enlarged prostate without lower urinary tract symptoms: Secondary | ICD-10-CM | POA: Diagnosis present

## 2018-05-19 DIAGNOSIS — E785 Hyperlipidemia, unspecified: Secondary | ICD-10-CM | POA: Diagnosis present

## 2018-05-19 DIAGNOSIS — E876 Hypokalemia: Secondary | ICD-10-CM | POA: Diagnosis present

## 2018-05-19 DIAGNOSIS — Z833 Family history of diabetes mellitus: Secondary | ICD-10-CM

## 2018-05-19 DIAGNOSIS — F419 Anxiety disorder, unspecified: Secondary | ICD-10-CM | POA: Diagnosis present

## 2018-05-19 DIAGNOSIS — F10188 Alcohol abuse with other alcohol-induced disorder: Secondary | ICD-10-CM | POA: Diagnosis present

## 2018-05-19 DIAGNOSIS — Z87442 Personal history of urinary calculi: Secondary | ICD-10-CM | POA: Diagnosis present

## 2018-05-19 DIAGNOSIS — F1010 Alcohol abuse, uncomplicated: Secondary | ICD-10-CM | POA: Diagnosis present

## 2018-05-19 DIAGNOSIS — Z7141 Alcohol abuse counseling and surveillance of alcoholic: Secondary | ICD-10-CM

## 2018-05-19 DIAGNOSIS — K859 Acute pancreatitis without necrosis or infection, unspecified: Secondary | ICD-10-CM

## 2018-05-19 DIAGNOSIS — Z981 Arthrodesis status: Secondary | ICD-10-CM

## 2018-05-19 DIAGNOSIS — Z79890 Hormone replacement therapy (postmenopausal): Secondary | ICD-10-CM

## 2018-05-19 DIAGNOSIS — N200 Calculus of kidney: Secondary | ICD-10-CM | POA: Diagnosis present

## 2018-05-19 DIAGNOSIS — I10 Essential (primary) hypertension: Secondary | ICD-10-CM | POA: Diagnosis present

## 2018-05-19 DIAGNOSIS — K852 Alcohol induced acute pancreatitis without necrosis or infection: Principal | ICD-10-CM | POA: Diagnosis present

## 2018-05-19 DIAGNOSIS — K219 Gastro-esophageal reflux disease without esophagitis: Secondary | ICD-10-CM | POA: Diagnosis present

## 2018-05-19 DIAGNOSIS — R3129 Other microscopic hematuria: Secondary | ICD-10-CM | POA: Diagnosis present

## 2018-05-19 LAB — URINALYSIS, ROUTINE W REFLEX MICROSCOPIC
Bilirubin Urine: NEGATIVE
Glucose, UA: NEGATIVE mg/dL
KETONES UR: NEGATIVE mg/dL
LEUKOCYTES UA: NEGATIVE
Nitrite: NEGATIVE
Protein, ur: 30 mg/dL — AB
SPECIFIC GRAVITY, URINE: 1.015 (ref 1.005–1.030)
pH: 5 (ref 5.0–8.0)

## 2018-05-19 LAB — COMPREHENSIVE METABOLIC PANEL
ALBUMIN: 4.6 g/dL (ref 3.5–5.0)
ALT: 35 U/L (ref 0–44)
ANION GAP: 14 (ref 5–15)
AST: 76 U/L — ABNORMAL HIGH (ref 15–41)
Alkaline Phosphatase: 34 U/L — ABNORMAL LOW (ref 38–126)
BUN: 26 mg/dL — ABNORMAL HIGH (ref 6–20)
CALCIUM: 9.2 mg/dL (ref 8.9–10.3)
CO2: 17 mmol/L — ABNORMAL LOW (ref 22–32)
Chloride: 103 mmol/L (ref 98–111)
Creatinine, Ser: 1.05 mg/dL (ref 0.61–1.24)
GFR calc non Af Amer: >60 mL/min (ref >60)
GLUCOSE: 124 mg/dL — AB (ref 70–99)
POTASSIUM: 4.1 mmol/L (ref 3.5–5.1)
Sodium: 134 mmol/L — ABNORMAL LOW (ref 135–145)
Total Bilirubin: 1.7 mg/dL — ABNORMAL HIGH (ref 0.3–1.2)

## 2018-05-19 LAB — CBC
HCT: 37.1 % — ABNORMAL LOW (ref 39.0–52.0)
HEMOGLOBIN: 13.1 g/dL (ref 13.0–17.0)
MCH: 33.4 pg (ref 26.0–34.0)
MCHC: 35.3 g/dL (ref 30.0–36.0)
MCV: 94.6 fL (ref 80.0–100.0)
Platelets: 308 10*3/uL (ref 150–400)
RBC: 3.92 MIL/uL — AB (ref 4.22–5.81)
RDW: 12.7 % (ref 11.5–15.5)
WBC: 12.7 10*3/uL — ABNORMAL HIGH (ref 4.0–10.5)
nRBC: 0 % (ref 0.0–0.2)

## 2018-05-19 LAB — LIPASE, BLOOD: Lipase: 146 U/L — ABNORMAL HIGH (ref 11–51)

## 2018-05-19 MED ORDER — IOHEXOL 300 MG/ML  SOLN
100.0000 mL | Freq: Once | INTRAMUSCULAR | Status: AC | PRN
  Administered 2018-05-19: 100 mL via INTRAVENOUS

## 2018-05-19 MED ORDER — ONDANSETRON HCL 4 MG/2ML IJ SOLN
4.0000 mg | Freq: Once | INTRAMUSCULAR | Status: AC
  Administered 2018-05-19: 4 mg via INTRAVENOUS
  Filled 2018-05-19: qty 2

## 2018-05-19 MED ORDER — FENTANYL CITRATE (PF) 100 MCG/2ML IJ SOLN
50.0000 ug | Freq: Once | INTRAMUSCULAR | Status: AC
  Administered 2018-05-19: 50 ug via INTRAVENOUS
  Filled 2018-05-19: qty 2

## 2018-05-19 MED ORDER — LACTATED RINGERS IV BOLUS
1000.0000 mL | Freq: Once | INTRAVENOUS | Status: AC
  Administered 2018-05-19: 1000 mL via INTRAVENOUS

## 2018-05-19 MED ORDER — LACTATED RINGERS IV SOLN
INTRAVENOUS | Status: DC
  Administered 2018-05-19: 23:00:00 via INTRAVENOUS

## 2018-05-19 MED ORDER — PROCHLORPERAZINE EDISYLATE 10 MG/2ML IJ SOLN
10.0000 mg | Freq: Once | INTRAMUSCULAR | Status: AC
  Administered 2018-05-19: 10 mg via INTRAVENOUS
  Filled 2018-05-19: qty 2

## 2018-05-19 NOTE — ED Triage Notes (Addendum)
Pt reports RUQ abdominal pain radiating to R flank pain since last night worsening today. Pt reports N/V x 1 day. Pt reports taking tylenol and pepto with no relief. Pt denies change in urination. Reports hx of kidney stones, states this feels similar

## 2018-05-19 NOTE — ED Notes (Signed)
Patient transported to CT 

## 2018-05-19 NOTE — ED Provider Notes (Signed)
[facility] EMERGENCY DEPARTMENT Provider Note   CSN: [id number] Arrival date & time: 05/19/18  1705     History   Chief Complaint Chief Complaint  Patient presents with  . Abdominal Pain    HPI [name] is a 58 y.o. male.  The history is provided by the patient.  Abdominal Pain  Pain location:  Epigastric and R flank Pain quality: aching, cramping and dull   Pain radiates to:  Back Pain severity:  Moderate Onset quality:  Gradual Duration:  2 days Timing:  Constant Progression:  Worsening Chronicity:  New Context: alcohol use   Relieved by:  Nothing Worsened by:  Nothing Associated symptoms: nausea and vomiting   Associated symptoms: no anorexia, no chest pain, no chills, no cough, no diarrhea, no dysuria, no fever, no hematemesis, no hematochezia, no hematuria, no shortness of breath and no sore throat   Risk factors: alcohol abuse     Past Medical History:  Diagnosis Date  . Alcohol abuse   . Anxiety   . Arthritis   . Diverticulosis of colon (without mention of hemorrhage)   . GERD (gastroesophageal reflux disease)   . History of kidney stones   . Hyperlipemia   . Hypertension    dr Ronalee Belts  norins  . Kidney stones   . Nephrolithiasis   . Pancreatitis    Hx of   . Ureteral stent displacement [facility]) 1990    Patient Active Problem List   Diagnosis Date Noted  . Radiculopathy 03/27/2018  . 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/02/2017  . Renal stone 10/08/2016  . Non-compliant behavior 12/02/2013  . Routine health maintenance 01/27/2012  . Hypertriglyceridemia 01/27/2012  . Knee pain, left 11/12/2011  . BPH (benign prostatic hyperplasia) 11/12/2011  . GERD 08/10/2008  . Essential hypertension 04/14/2008  . Alcohol abuse 10/26/2007  . TRIGGER FINGER, LEFT MIDDLE 05/12/2007  . NEPHROLITHIASIS, HX OF 05/12/2007    Past Surgical History:  Procedure Laterality Date  . A2 pulley flexor sheath cyst 4th finger excisional biopsy       '03  . ANTERIOR CRUCIATE LIGAMENT REPAIR     R knee  . ESOPHAGOGASTRODUODENOSCOPY N/A 07/09/2012   Procedure: ESOPHAGOGASTRODUODENOSCOPY (EGD);  Surgeon: Inda Castle, MD;  Location: [facility];  Service: Endoscopy;  Laterality: N/A;  . KNEE SURGERY  2008  . SHOULDER ARTHROSCOPY WITH SUBACROMIAL DECOMPRESSION AND OPEN ROTATOR C Right 12/26/2012   Procedure: RIGHT SHOULDER ARTHROSCOPY WITH SUBACROMIAL DECOMPRESSION MINI OPEN ROTATOR CUFF REPAIR, OPEN BICEP TENODESIS OPEN DISTAL CLAVICLE RESECTION  ;  Surgeon: Augustin Schooling, MD;  Location: [facility];  Service: Orthopedics;  Laterality: Right;  . TRANSFORAMINAL LUMBAR INTERBODY FUSION (TLIF) WITH PEDICLE SCREW FIXATION 1 LEVEL Right 03/27/2018   Procedure: RIGHT-SIDED LUMBAR 4-5 TRANSFORAMINAL LUMBAR INTERBODY FUSION WITH INSTRUMENTATION AND ALLOGRAFT;  Surgeon: Phylliss Bob, MD;  Location: [facility];  Service: Orthopedics;  Laterality: Right;  . URETEROSCOPY     and stone retreval        Home Medications    Prior to Admission medications   Medication Sig Start Date End Date Taking? Authorizing Provider  acetaminophen (TYLENOL) 500 MG tablet Take 1,000 mg by mouth every 6 (six) hours as needed for mild pain or moderate pain.   Yes [provider]  amLODipine (NORVASC) 10 MG tablet TAKE 1 TABLET BY MOUTH EVERY DAY Patient taking differently: Take 10 mg by mouth daily.  01/13/18  Yes Clent Demark, PA-C  carvedilol (COREG) 12.5 MG tablet  TAKE 1 TABLET BY MOUTH TWICE DAILY A MEAL Patient taking differently: Take 12.5 mg by mouth 2 (two) times daily with a meal.  12/17/17  Yes Clent Demark, PA-C  losartan-hydrochlorothiazide (HYZAAR) 100-25 MG tablet TAKE 1 TABLET BY MOUTH EVERY DAY Patient taking differently: Take 1 tablet by mouth daily.  01/13/18  Yes Clent Demark, PA-C  Melatonin 5 MG TABS Take 1 tablet (5 mg total) by mouth daily. Patient taking differently: Take 1 tablet by mouth at bedtime as needed (sleep).   04/24/17  Yes Clent Demark, PA-C  Multiple Vitamins-Minerals (MULTIVITAMIN WITH MINERALS) tablet Take 1 tablet by mouth daily.   Yes [provider]  naphazoline-pheniramine (NAPHCON-A) 0.025-0.3 % ophthalmic solution 1 drop 4 (four) times daily as needed for eye irritation.   Yes [provider]  omeprazole (PRILOSEC) 20 MG capsule TAKE 1 CAPSULE BY MOUTH DAILY. Patient taking differently: Take 20 mg by mouth daily.  09/16/17  Yes Gildardo Pounds, NP  polycarbophil (FIBERCON) 625 MG tablet Take 625 mg by mouth daily.   Yes [provider]  tetrahydrozoline 0.05 % ophthalmic solution Place 2 drops into both eyes as needed.   Yes [provider]  chlorhexidine (PERIDEX) 0.12 % solution Use as directed 15 mLs in the mouth or throat 2 (two) times daily. Patient not taking: Reported on 05/19/2018 08/27/17   Clent Demark, PA-C  cyclobenzaprine (FLEXERIL) 10 MG tablet Take 1 tablet (10 mg total) by mouth at bedtime. Patient not taking: Reported on 05/19/2018 08/27/17   Clent Demark, PA-C  gabapentin (NEURONTIN) 300 MG capsule Take 2 capsules (600 mg total) by mouth 3 (three) times daily. Patient not taking: Reported on 05/19/2018 07/02/17   Clent Demark, PA-C  hydrOXYzine (ATARAX/VISTARIL) 25 MG tablet Take 1 tablet (25 mg total) by mouth at bedtime. Patient not taking: Reported on 05/19/2018 04/24/17   Clent Demark, PA-C  losartan-hydrochlorothiazide [facility]) 100-25 MG tablet Take 1 tablet by mouth daily. Patient not taking: Reported on 05/19/2018 11/14/16   Clent Demark, PA-C    Family History Family History  Problem Relation Age of Onset  . Hypertension Father        X4 siblings  . Heart disease Father   . Diabetes Mother        x2 brothers    Social History Social History   Tobacco Use  . Smoking status: Former Smoker    Types: Cigarettes    Last attempt to quit: 07/10/1998    Years since quitting: 19.8  . Smokeless  tobacco: Never Used  Substance Use Topics  . Alcohol use: Yes    Alcohol/week: 6.0 standard drinks    Types: 6 Cans of beer per week    Comment: 1 6 pack a week  . Drug use: No     Allergies   Hydrocodone-acetaminophen and Shrimp [shellfish allergy]   Review of Systems Review of Systems  Constitutional: Negative for chills and fever.  HENT: Negative for ear pain and sore throat.   Eyes: Negative for pain and visual disturbance.  Respiratory: Negative for cough and shortness of breath.   Cardiovascular: Negative for chest pain and palpitations.  Gastrointestinal: Positive for abdominal pain, nausea and vomiting. Negative for abdominal distention, anorexia, diarrhea, hematemesis and hematochezia.  Genitourinary: Negative for dysuria and hematuria.  Musculoskeletal: Negative for arthralgias and back pain.  Skin: Negative for color change and rash.  Neurological: Negative for seizures and syncope.  All other systems reviewed  and are negative.    Physical Exam Updated Vital Signs  ED Triage Vitals  Enc Vitals Group     BP 05/19/18 1735 (!) 149/107     Pulse Rate 05/19/18 1735 82     Resp 05/19/18 1735 18     Temp 05/19/18 1735 98.7 F (37.1 C)     Temp Source 05/19/18 1735 Oral     SpO2 05/19/18 1735 98 %     Weight --      Height --      Head Circumference --      Peak Flow --      Pain Score 05/19/18 1755 10     Pain Loc --      Pain Edu? --      Excl. in [place]? --     Physical Exam Vitals signs and nursing note reviewed.  Constitutional:      General: He is in acute distress.     Appearance: He is well-developed.  HENT:     Head: Normocephalic and atraumatic.     Mouth/Throat:     Mouth: Mucous membranes are moist.  Eyes:     Extraocular Movements: Extraocular movements intact.     Conjunctiva/sclera: Conjunctivae normal.  Neck:     Musculoskeletal: Neck supple.  Cardiovascular:     Rate and Rhythm: Normal rate and regular rhythm.     Heart sounds:  Normal heart sounds. No murmur.  Pulmonary:     Effort: Pulmonary effort is normal. No respiratory distress.     Breath sounds: Normal breath sounds.  Abdominal:     General: Bowel sounds are normal. There is no distension.     Palpations: Abdomen is soft.     Tenderness: There is abdominal tenderness in the right upper quadrant and epigastric area. There is right CVA tenderness. There is no left CVA tenderness, guarding or rebound.  Skin:    General: Skin is warm and dry.     Capillary Refill: Capillary refill takes less than 2 seconds.  Neurological:     General: No focal deficit present.     Mental Status: He is alert.  Psychiatric:        Mood and Affect: Mood normal.      ED Treatments / Results  Labs (all labs ordered are listed, but only abnormal results are displayed) Labs Reviewed  LIPASE, BLOOD - Abnormal; Notable for the following components:      Result Value   Lipase 146 (*)    All other components within normal limits  COMPREHENSIVE METABOLIC PANEL - Abnormal; Notable for the following components:   Sodium 134 (*)    CO2 17 (*)    Glucose, Bld 124 (*)    BUN 26 (*)    AST 76 (*)    Alkaline Phosphatase 34 (*)    Total Bilirubin 1.7 (*)    All other components within normal limits  CBC - Abnormal; Notable for the following components:   WBC 12.7 (*)    RBC 3.92 (*)    HCT 37.1 (*)    All other components within normal limits  URINALYSIS, ROUTINE W REFLEX MICROSCOPIC - Abnormal; Notable for the following components:   APPearance HAZY (*)    Hgb urine dipstick MODERATE (*)    Protein, ur 30 (*)    Bacteria, UA RARE (*)    All other components within normal limits    EKG None  Radiology Ct Abdomen Pelvis W Contrast  Result Date:  05/19/2018 CLINICAL DATA:  58 year old male with right abdominal pain radiating to the flank since last night. Nausea vomiting. Abdominal distension. EXAM: CT ABDOMEN AND PELVIS WITH CONTRAST TECHNIQUE: Multidetector CT  imaging of the abdomen and pelvis was performed using the standard protocol following bolus administration of intravenous contrast. CONTRAST:  118mL OMNIPAQUE IOHEXOL 300 MG/ML  SOLN COMPARISON:  CT Abdomen and Pelvis 10/06/2016 and earlier. FINDINGS: Lower chest: Negative. No pericardial or pleural effusion. Hepatobiliary: Hepatic steatosis. Negative gallbladder. Pancreas: Inflamed pancreatic body and tail (series 3, image 31) with peripancreatic inflammatory stranding. No pancreatic necrosis identified. No associated fluid collection. Spleen: Negative aside from pancreatic inflammation at the splenic hilum. Adrenals/Urinary Tract: Adrenal glands are within normal limits. Bilateral nephrolithiasis, largest stone is 4-5 millimeters in the right lower pole. Symmetric bilateral renal enhancement and contrast excretion. No acute perinephric stranding. Both ureters are within normal limits. Diminutive, unremarkable urinary bladder. Stomach/Bowel: Redundant sigmoid colon which tracks into the left upper quadrant and epigastrium. Mild to moderate sigmoid and descending colon diverticulosis. No associated inflammation. Decompressed transverse colon. Moderate diverticulosis at the hepatic flexure without active inflammation. The cecum is on a lax mesentery. Normal appendix (series 3, image 62). Negative terminal ileum. No dilated small bowel. Stomach and duodenum remain within normal limits. No free air. No free fluid. Vascular/Lymphatic: Aortoiliac calcified atherosclerosis. Major arterial structures remain patent. The portal venous system is patent. No lymphadenopathy. Reproductive: Negative. Other: No pelvic free fluid. Musculoskeletal: Lower lumbar spine posterior fusion sequelae. No acute osseous abnormality identified. IMPRESSION: 1. Acute pancreatitis. Inflammation about the pancreatic body and tail with no fluid collection or complicating features. 2. Bilateral nephrolithiasis but no obstructive uropathy. 3. Fatty  liver disease.  Large bowel diverticulosis. 4. Aortic Atherosclerosis (ICD10-I70.0). Electronically Signed   By: Genevie Ann M.D.   On: 05/19/2018 23:03    Procedures Procedures (including critical care time)  Medications Ordered in ED Medications  lactated ringers infusion (has no administration in time range)  fentaNYL (SUBLIMAZE) injection 50 mcg (has no administration in time range)  prochlorperazine (COMPAZINE) injection 10 mg (has no administration in time range)  ondansetron (ZOFRAN) injection 4 mg (4 mg Intravenous Given 05/19/18 2126)  fentaNYL (SUBLIMAZE) injection 50 mcg (50 mcg Intravenous Given 05/19/18 2128)  lactated ringers bolus 1,000 mL (1,000 mLs Intravenous New Bag/Given 05/19/18 2128)  iohexol (OMNIPAQUE) 300 MG/ML solution 100 mL (100 mLs Intravenous Contrast Given 05/19/18 2238)     Initial Impression / Assessment and Plan / ED Course  I have reviewed the triage vital signs and the nursing notes.  Pertinent labs & imaging results that were available during my care of the patient were reviewed by me and considered in my medical decision making (see chart for details).     [name] is a 58 year old male with history of pancreatitis, kidney stones, alcohol abuse who presents to the ED with epigastric, right flank pain.  Patient with unremarkable vitals.  No fever.  Pain for the last 2 days with nausea and vomiting.  Patient has taken Tylenol and Pepto-Bismol without much relief.  States that it feels like his prior kidney stones.  Continues to abuse alcohol.  Denies any blood in his urine.  Patient is mostly tender in the right epigastric, right upper quadrant, right flank on exam.  No signs of peritonitis.  Overall is well-appearing.  However patient states that he has been unable to eat or drink anything today without throwing up.  Vomiting has been nonbloody nonbilious.  Patient denies  any chest pain, shortness of breath.  No melena, no hematochezia. Will get labs  imaging. Give IV fluids, IV pain medicine, IV zofran.  Patient with lab work that showed elevated lipase to 146.  AST mildly elevated.  ALT normal.  Likely from alcohol.  Bilirubin mildly elevated at 1.7.  Otherwise no significant anemia, electrolyte abnormality.  Mild leukocytosis.  CT of the abdomen and pelvis shows acute pancreatitis.  No abscess or other complicating features.  No renal stone.  Gallbladder unremarkable.  Likely patient with pancreatitis from alcohol abuse.  On reevaluation continues to feel nauseous, epigastric pain.  Started on IV maintenance fluids, IV Compazine, given additional dose of IV fentanyl.  To be admitted to hospitalist service for further care.  This chart was dictated using voice recognition software.  Despite best efforts to proofread,  errors can occur which can change the documentation meaning.   Final Clinical Impressions(s) / ED Diagnoses   Final diagnoses:  Acute pancreatitis, unspecified complication status, unspecified pancreatitis type    ED Discharge Orders    None       Lennice Sites, DO 05/19/18 2308

## 2018-05-20 ENCOUNTER — Encounter (HOSPITAL_COMMUNITY): Payer: Self-pay | Admitting: General Practice

## 2018-05-20 DIAGNOSIS — E872 Acidosis, unspecified: Secondary | ICD-10-CM

## 2018-05-20 DIAGNOSIS — K852 Alcohol induced acute pancreatitis without necrosis or infection: Secondary | ICD-10-CM | POA: Diagnosis present

## 2018-05-20 LAB — COMPREHENSIVE METABOLIC PANEL
ALT: 36 U/L (ref 0–44)
AST: 45 U/L — ABNORMAL HIGH (ref 15–41)
Albumin: 4.5 g/dL (ref 3.5–5.0)
Alkaline Phosphatase: 32 U/L — ABNORMAL LOW (ref 38–126)
Anion gap: 14 (ref 5–15)
BUN: 20 mg/dL (ref 6–20)
CO2: 19 mmol/L — ABNORMAL LOW (ref 22–32)
Calcium: 9.2 mg/dL (ref 8.9–10.3)
Chloride: 105 mmol/L (ref 98–111)
Creatinine, Ser: 1.23 mg/dL (ref 0.61–1.24)
GFR calc non Af Amer: >60 mL/min (ref >60)
Glucose, Bld: 120 mg/dL — ABNORMAL HIGH (ref 70–99)
Potassium: 3.6 mmol/L (ref 3.5–5.1)
Sodium: 138 mmol/L (ref 135–145)
Total Bilirubin: 1.6 mg/dL — ABNORMAL HIGH (ref 0.3–1.2)
Total Protein: 7.5 g/dL (ref 6.5–8.1)

## 2018-05-20 LAB — CBC
HCT: 37.5 % — ABNORMAL LOW (ref 39.0–52.0)
Hemoglobin: 12.6 g/dL — ABNORMAL LOW (ref 13.0–17.0)
MCH: 31.3 pg (ref 26.0–34.0)
MCHC: 33.6 g/dL (ref 30.0–36.0)
MCV: 93.1 fL (ref 80.0–100.0)
Platelets: 238 10*3/uL (ref 150–400)
RBC: 4.03 MIL/uL — ABNORMAL LOW (ref 4.22–5.81)
RDW: 12.4 % (ref 11.5–15.5)
WBC: 16.3 10*3/uL — ABNORMAL HIGH (ref 4.0–10.5)
nRBC: 0 % (ref 0.0–0.2)

## 2018-05-20 LAB — HIV ANTIBODY (ROUTINE TESTING W REFLEX): HIV Screen 4th Generation wRfx: NONREACTIVE

## 2018-05-20 MED ORDER — VITAMIN B-1 100 MG PO TABS
100.0000 mg | ORAL_TABLET | Freq: Every day | ORAL | Status: DC
  Administered 2018-05-20 – 2018-05-24 (×5): 100 mg via ORAL
  Filled 2018-05-20 (×5): qty 1

## 2018-05-20 MED ORDER — HYDRALAZINE HCL 20 MG/ML IJ SOLN: 10.0000 mg | Freq: Four times a day (QID) | INTRAMUSCULAR | Status: DC | PRN

## 2018-05-20 MED ORDER — CARVEDILOL 12.5 MG PO TABS
12.5000 mg | ORAL_TABLET | Freq: Two times a day (BID) | ORAL | Status: DC
  Administered 2018-05-20 – 2018-05-24 (×9): 12.5 mg via ORAL
  Filled 2018-05-20 (×9): qty 1

## 2018-05-20 MED ORDER — FOLIC ACID 1 MG PO TABS
1.0000 mg | ORAL_TABLET | Freq: Every day | ORAL | Status: DC
  Administered 2018-05-20 – 2018-05-24 (×5): 1 mg via ORAL
  Filled 2018-05-20 (×5): qty 1

## 2018-05-20 MED ORDER — ACETAMINOPHEN 500 MG PO TABS
1000.0000 mg | ORAL_TABLET | Freq: Three times a day (TID) | ORAL | Status: DC | PRN
  Administered 2018-05-21 – 2018-05-24 (×4): 1000 mg via ORAL
  Filled 2018-05-20 (×4): qty 2

## 2018-05-20 MED ORDER — AMLODIPINE BESYLATE 10 MG PO TABS
10.0000 mg | ORAL_TABLET | Freq: Every day | ORAL | Status: DC
  Administered 2018-05-20 – 2018-05-24 (×5): 10 mg via ORAL
  Filled 2018-05-20 (×5): qty 1

## 2018-05-20 MED ORDER — MELATONIN 3 MG PO TABS
2.0000 | ORAL_TABLET | Freq: Every evening | ORAL | Status: DC | PRN
  Administered 2018-05-22 (×2): 6 mg via ORAL
  Filled 2018-05-20 (×5): qty 2

## 2018-05-20 MED ORDER — ADULT MULTIVITAMIN W/MINERALS CH
1.0000 | ORAL_TABLET | Freq: Every day | ORAL | Status: DC
  Administered 2018-05-20 – 2018-05-24 (×5): 1 via ORAL
  Filled 2018-05-20 (×5): qty 1

## 2018-05-20 MED ORDER — LACTATED RINGERS IV SOLN
INTRAVENOUS | Status: AC
  Administered 2018-05-20 – 2018-05-21 (×3): via INTRAVENOUS

## 2018-05-20 MED ORDER — MORPHINE SULFATE (PF) 2 MG/ML IV SOLN
1.0000 mg | INTRAVENOUS | Status: DC | PRN
  Administered 2018-05-20 – 2018-05-21 (×12): 1 mg via INTRAVENOUS
  Filled 2018-05-20 (×13): qty 1

## 2018-05-20 MED ORDER — LORAZEPAM 1 MG PO TABS: 1.0000 mg | ORAL_TABLET | Freq: Four times a day (QID) | ORAL | Status: AC | PRN

## 2018-05-20 MED ORDER — ENOXAPARIN SODIUM 40 MG/0.4ML ~~LOC~~ SOLN
40.0000 mg | SUBCUTANEOUS | Status: DC
  Administered 2018-05-20 – 2018-05-23 (×4): 40 mg via SUBCUTANEOUS
  Filled 2018-05-20 (×4): qty 0.4

## 2018-05-20 MED ORDER — LACTATED RINGERS IV SOLN
INTRAVENOUS | Status: DC
  Administered 2018-05-20 (×2): via INTRAVENOUS

## 2018-05-20 MED ORDER — THIAMINE HCL 100 MG/ML IJ SOLN: 100.0000 mg | Freq: Every day | INTRAMUSCULAR | Status: DC

## 2018-05-20 MED ORDER — LORAZEPAM 2 MG/ML IJ SOLN: 1.0000 mg | Freq: Four times a day (QID) | INTRAMUSCULAR | Status: AC | PRN

## 2018-05-20 MED ORDER — PANTOPRAZOLE SODIUM 40 MG PO TBEC
40.0000 mg | DELAYED_RELEASE_TABLET | Freq: Every day | ORAL | Status: DC
  Administered 2018-05-20 – 2018-05-24 (×5): 40 mg via ORAL
  Filled 2018-05-20 (×5): qty 1

## 2018-05-20 MED ORDER — ONDANSETRON HCL 4 MG/2ML IJ SOLN
4.0000 mg | Freq: Four times a day (QID) | INTRAMUSCULAR | Status: DC | PRN
  Administered 2018-05-20 – 2018-05-22 (×3): 4 mg via INTRAVENOUS
  Filled 2018-05-20 (×3): qty 2

## 2018-05-20 MED ORDER — ADULT MULTIVITAMIN W/MINERALS CH: 1.0000 | ORAL_TABLET | Freq: Every day | ORAL | Status: DC

## 2018-05-20 NOTE — Progress Notes (Signed)
Report called to Mallorey Odonell, nurse on 5W. Patient being transferred to med surg unit [phone].

## 2018-05-20 NOTE — H&P (Signed)
History and Physical    [name] CWC:[id number] DOB: 18-Sep-1960 DOA: 05/19/2018  PCP: Clent Demark, PA-C Patient coming from: Home  Chief Complaint: Abdominal pain, nausea, vomiting  HPI: [name] is a 58 y.o. male with medical history significant of alcohol abuse, pancreatitis, nephrolithiasis, anxiety, GERD, hypertension, hyperlipidemia presenting to the hospital for evaluation of abdominal pain, nausea, and vomiting.  Patient states he started having severe epigastric and right upper quadrant abdominal pain associated with nausea and vomiting a day ago.  Reports having several episodes of nonbloody nonbilious emesis yesterday.  Currently feeling nauseous.  He has not been able to tolerate any p.o. intake.  States he normally drinks 3-4 beers 4 times a week.  Per patient, he "overdid it" this weekend on Saturday and Sunday and was drinking a lot of beer and liquor.  Alcohol last consumed over 24 hours ago.  Denies having any fevers, chills, or diarrhea.  States he has previously had pancreatitis related to his alcohol use several years ago.  Denies any prior history of seizures or hospitalization for alcohol withdrawal.  Denies any tobacco or illicit drug use.  Review of Systems: As per HPI otherwise 10 point review of systems negative.  Past Medical History:  Diagnosis Date  . Alcohol abuse   . Anxiety   . Arthritis   . Diverticulosis of colon (without mention of hemorrhage)   . GERD (gastroesophageal reflux disease)   . History of kidney stones   . Hyperlipemia   . Hypertension    dr Ronalee Belts  norins  . Kidney stones   . Nephrolithiasis   . Pancreatitis    Hx of   . Ureteral stent displacement ([place]) 1990    Past Surgical History:  Procedure Laterality Date  . A2 pulley flexor sheath cyst 4th finger excisional biopsy     '03  . ANTERIOR CRUCIATE LIGAMENT REPAIR     R knee  . ESOPHAGOGASTRODUODENOSCOPY N/A 07/09/2012   Procedure: ESOPHAGOGASTRODUODENOSCOPY (EGD);   Surgeon: Inda Castle, MD;  Location: [facility];  Service: Endoscopy;  Laterality: N/A;  . KNEE SURGERY  2008  . SHOULDER ARTHROSCOPY WITH SUBACROMIAL DECOMPRESSION AND OPEN ROTATOR C Right 12/26/2012   Procedure: RIGHT SHOULDER ARTHROSCOPY WITH SUBACROMIAL DECOMPRESSION MINI OPEN ROTATOR CUFF REPAIR, OPEN BICEP TENODESIS OPEN DISTAL CLAVICLE RESECTION  ;  Surgeon: Augustin Schooling, MD;  Location: [facility];  Service: Orthopedics;  Laterality: Right;  . TRANSFORAMINAL LUMBAR INTERBODY FUSION (TLIF) WITH PEDICLE SCREW FIXATION 1 LEVEL Right 03/27/2018   Procedure: RIGHT-SIDED LUMBAR 4-5 TRANSFORAMINAL LUMBAR INTERBODY FUSION WITH INSTRUMENTATION AND ALLOGRAFT;  Surgeon: Phylliss Bob, MD;  Location: [facility];  Service: Orthopedics;  Laterality: Right;  . URETEROSCOPY     and stone retreval     reports that he quit smoking about 19 years ago. His smoking use included cigarettes. He has never used smokeless tobacco. He reports current alcohol use of about 6.0 standard drinks of alcohol per week. He reports that he does not use drugs.  Allergies  Allergen Reactions  . Hydrocodone-Acetaminophen Palpitations  . Shrimp [Shellfish Allergy] Other (See Comments)    Cholesterol level becomes very high.    Family History  Problem Relation Age of Onset  . Hypertension Father        X4 siblings  . Heart disease Father   . Diabetes Mother        x2 brothers    Prior to Admission medications   Medication Sig Start Date End Date Taking?  Authorizing Provider  acetaminophen (TYLENOL) 500 MG tablet Take 1,000 mg by mouth every 6 (six) hours as needed for mild pain or moderate pain.   Yes [provider]  amLODipine (NORVASC) 10 MG tablet TAKE 1 TABLET BY MOUTH EVERY DAY Patient taking differently: Take 10 mg by mouth daily.  01/13/18  Yes Clent Demark, PA-C  carvedilol (COREG) 12.5 MG tablet TAKE 1 TABLET BY MOUTH TWICE DAILY A MEAL Patient taking differently: Take 12.5 mg by mouth 2 (two)  times daily with a meal.  12/17/17  Yes Clent Demark, PA-C  losartan-hydrochlorothiazide (HYZAAR) 100-25 MG tablet TAKE 1 TABLET BY MOUTH EVERY DAY Patient taking differently: Take 1 tablet by mouth daily.  01/13/18  Yes Clent Demark, PA-C  Melatonin 5 MG TABS Take 1 tablet (5 mg total) by mouth daily. Patient taking differently: Take 1 tablet by mouth at bedtime as needed (sleep).  04/24/17  Yes Clent Demark, PA-C  Multiple Vitamins-Minerals (MULTIVITAMIN WITH MINERALS) tablet Take 1 tablet by mouth daily.   Yes [provider]  naphazoline-pheniramine (NAPHCON-A) 0.025-0.3 % ophthalmic solution 1 drop 4 (four) times daily as needed for eye irritation.   Yes [provider]  omeprazole (PRILOSEC) 20 MG capsule TAKE 1 CAPSULE BY MOUTH DAILY. Patient taking differently: Take 20 mg by mouth daily.  09/16/17  Yes Gildardo Pounds, NP  polycarbophil (FIBERCON) 625 MG tablet Take 625 mg by mouth daily.   Yes [provider]  tetrahydrozoline 0.05 % ophthalmic solution Place 2 drops into both eyes as needed.   Yes [provider]  chlorhexidine (PERIDEX) 0.12 % solution Use as directed 15 mLs in the mouth or throat 2 (two) times daily. Patient not taking: Reported on 05/19/2018 08/27/17   Clent Demark, PA-C  cyclobenzaprine (FLEXERIL) 10 MG tablet Take 1 tablet (10 mg total) by mouth at bedtime. Patient not taking: Reported on 05/19/2018 08/27/17   Clent Demark, PA-C  gabapentin (NEURONTIN) 300 MG capsule Take 2 capsules (600 mg total) by mouth 3 (three) times daily. Patient not taking: Reported on 05/19/2018 07/02/17   Clent Demark, PA-C  hydrOXYzine (ATARAX/VISTARIL) 25 MG tablet Take 1 tablet (25 mg total) by mouth at bedtime. Patient not taking: Reported on 05/19/2018 04/24/17   Clent Demark, PA-C  losartan-hydrochlorothiazide [facility]) 100-25 MG tablet Take 1 tablet by mouth daily. Patient not taking: Reported on 05/19/2018 11/14/16    Clent Demark, PA-C    Physical Exam: Vitals:   05/19/18 2145 05/19/18 2215 05/20/18 0045 05/20/18 0200  BP: (!) 163/97 (!) 152/91 (!) 153/94   Pulse: 84 91 (!) 107   Resp:   18   Temp:      TempSrc:      SpO2: 100% 97% 98%   Weight:    109.7 kg  Height:    6' (1.829 m)    Physical Exam  Constitutional: He is oriented to person, place, and time. He appears well-developed and well-nourished. No distress.  HENT:  Head: Normocephalic.  Mouth/Throat: Oropharynx is clear and moist.  Eyes: Right eye exhibits no discharge. Left eye exhibits no discharge.  Neck: Neck supple.  Cardiovascular: Normal rate, regular rhythm and intact distal pulses.  Pulmonary/Chest: Effort normal and breath sounds normal. No respiratory distress. He has no wheezes. He has no rales.  Abdominal: Soft. Bowel sounds are normal. He exhibits distension. There is abdominal tenderness. There is no rebound and no guarding.  Epigastrium and right upper quadrant  tender to palpation.  Musculoskeletal:        General: No edema.  Neurological: He is alert and oriented to person, place, and time.  No tremors noted  Skin: Skin is warm and dry. He is not diaphoretic.  Psychiatric: His behavior is normal.     Labs on Admission: I have personally reviewed following labs and imaging studies  CBC: Recent Labs  Lab 05/19/18 1800  WBC 12.7*  HGB 13.1  HCT 37.1*  MCV 94.6  PLT 182   Basic Metabolic Panel: Recent Labs  Lab 05/19/18 1800  NA 134*  K 4.1  CL 103  CO2 17*  GLUCOSE 124*  BUN 26*  CREATININE 1.05  CALCIUM 9.2   GFR: Estimated Creatinine Clearance: 100.4 mL/min (by C-G formula based on SCr of 1.05 mg/dL). Liver Function Tests: Recent Labs  Lab 05/19/18 1800  AST 76*  ALT 35  ALKPHOS 34*  BILITOT 1.7*  PROT RESULTS UNAVAILABLE DUE TO INTERFERING SUBSTANCE  ALBUMIN 4.6   Recent Labs  Lab 05/19/18 1800  LIPASE 146*   No results for input(s): AMMONIA in the last 168  hours. Coagulation Profile: No results for input(s): INR, PROTIME in the last 168 hours. Cardiac Enzymes: No results for input(s): CKTOTAL, CKMB, CKMBINDEX, TROPONINI in the last 168 hours. BNP (last 3 results) No results for input(s): PROBNP in the last 8760 hours. HbA1C: No results for input(s): HGBA1C in the last 72 hours. CBG: No results for input(s): GLUCAP in the last 168 hours. Lipid Profile: No results for input(s): CHOL, HDL, LDLCALC, TRIG, CHOLHDL, LDLDIRECT in the last 72 hours. Thyroid Function Tests: No results for input(s): TSH, T4TOTAL, FREET4, T3FREE, THYROIDAB in the last 72 hours. Anemia Panel: No results for input(s): VITAMINB12, FOLATE, FERRITIN, TIBC, IRON, RETICCTPCT in the last 72 hours. Urine analysis:    Component Value Date/Time   COLORURINE YELLOW 05/19/2018 1830   APPEARANCEUR HAZY (A) 05/19/2018 1830   LABSPEC 1.015 05/19/2018 1830   PHURINE 5.0 05/19/2018 1830   GLUCOSEU NEGATIVE 05/19/2018 1830   GLUCOSEU NEGATIVE 06/08/2010 1329   HGBUR MODERATE (A) 05/19/2018 1830   BILIRUBINUR NEGATIVE 05/19/2018 1830   KETONESUR NEGATIVE 05/19/2018 1830   PROTEINUR 30 (A) 05/19/2018 1830   UROBILINOGEN 0.2 05/27/2014 1552   NITRITE NEGATIVE 05/19/2018 1830   LEUKOCYTESUR NEGATIVE 05/19/2018 1830    Radiological Exams on Admission: Ct Abdomen Pelvis W Contrast  Result Date: 05/19/2018 CLINICAL DATA:  58 year old male with right abdominal pain radiating to the flank since last night. Nausea vomiting. Abdominal distension. EXAM: CT ABDOMEN AND PELVIS WITH CONTRAST TECHNIQUE: Multidetector CT imaging of the abdomen and pelvis was performed using the standard protocol following bolus administration of intravenous contrast. CONTRAST:  19mL OMNIPAQUE IOHEXOL 300 MG/ML  SOLN COMPARISON:  CT Abdomen and Pelvis 10/06/2016 and earlier. FINDINGS: Lower chest: Negative. No pericardial or pleural effusion. Hepatobiliary: Hepatic steatosis. Negative gallbladder. Pancreas:  Inflamed pancreatic body and tail (series 3, image 31) with peripancreatic inflammatory stranding. No pancreatic necrosis identified. No associated fluid collection. Spleen: Negative aside from pancreatic inflammation at the splenic hilum. Adrenals/Urinary Tract: Adrenal glands are within normal limits. Bilateral nephrolithiasis, largest stone is 4-5 millimeters in the right lower pole. Symmetric bilateral renal enhancement and contrast excretion. No acute perinephric stranding. Both ureters are within normal limits. Diminutive, unremarkable urinary bladder. Stomach/Bowel: Redundant sigmoid colon which tracks into the left upper quadrant and epigastrium. Mild to moderate sigmoid and descending colon diverticulosis. No associated inflammation. Decompressed transverse colon. Moderate diverticulosis at the  hepatic flexure without active inflammation. The cecum is on a lax mesentery. Normal appendix (series 3, image 62). Negative terminal ileum. No dilated small bowel. Stomach and duodenum remain within normal limits. No free air. No free fluid. Vascular/Lymphatic: Aortoiliac calcified atherosclerosis. Major arterial structures remain patent. The portal venous system is patent. No lymphadenopathy. Reproductive: Negative. Other: No pelvic free fluid. Musculoskeletal: Lower lumbar spine posterior fusion sequelae. No acute osseous abnormality identified. IMPRESSION: 1. Acute pancreatitis. Inflammation about the pancreatic body and tail with no fluid collection or complicating features. 2. Bilateral nephrolithiasis but no obstructive uropathy. 3. Fatty liver disease.  Large bowel diverticulosis. 4. Aortic Atherosclerosis (ICD10-I70.0). Electronically Signed   By: Genevie Ann M.D.   On: 05/19/2018 23:03    Assessment/Plan Principal Problem:   Alcoholic pancreatitis Active Problems:   Alcohol abuse   NEPHROLITHIASIS, HX OF   Essential hypertension   Normal anion gap metabolic acidosis   Acute alcoholic  pancreatitis -In the setting of binge drinking over the weekend. -Afebrile.  Mild leukocytosis.  AST mildly elevated at 76, T bili 1.7.  Remainder of LFTs normal.  Lipase mildly elevated at 146.  CT with evidence of acute pancreatitis; no fluid collection or complicating features. -IV fluid hydration -Morphine 1 mg every 3 hours as needed for severe pain -IV Zofran PRN nausea -Patient wants to try drinking sips of liquids.  Clear liquid diet has been ordered. -Repeat CBC in a.m.  Normal anion gap metabolic acidosis Bicarb 17, anion gap 14.  Bicarb low a month ago as well.  Patient denies having any diarrhea.  RTA is also on the differential.  Urine pH 5.0. -Continue to monitor electrolyte panel -Spoke to the main lab.  Unable to order urine ammonia level for RTA work-up as it is not on their formulary.  Patient will need outpatient follow-up with PCP.  History of nephrolithiasis UA with evidence of microscopic hematuria.  CT showing bilateral nephrolithiasis without obstructive uropathy. -Outpatient urology follow-up  Alcohol abuse Alcohol last consumed over 24 hours ago.  No tachycardia or tremors at this time. -CIWA protocol; Ativan PRN -Thiamine, folate, multivitamin  Hypertension Systolic in the 702O and diastolic in the 37C. -Continue home amlodipine, carvedilol  GERD -Continue PPI  DVT prophylaxis: Lovenox Code Status: Full code Family Communication: No family available Disposition Plan: Anticipate discharge in 1 to 2 days. Consults called: None Admission status: Observation   Shela Leff MD [facility] Pager [phone]  If 7PM-7AM, please contact night-coverage [email or web address] Password [email or web address]  05/20/2018, 3:04 AM

## 2018-05-20 NOTE — Progress Notes (Addendum)
PROGRESS NOTE                                                                                                                                                                                                             Patient Demographics:    [name], is a 58 y.o. male, DOB - November 28, 1960, SPQ:[id number]  Admit date - 05/19/2018   Admitting Physician Shela Leff, MD  Outpatient Primary MD for the patient is Clent Demark, PA-C  LOS - 0    Chief Complaint  Patient presents with  . Abdominal Pain       Brief Narrative Please see admission H&P from earlier this morning for details, in brief, 58 year old male with alcohol abuse, history of pancreatitis, nephrolithiasis, anxiety, GERD, hypertension hyperlipidemia presenting with abdominal pain with nausea and vomiting since 1 day following heavy drinking over the weekend. Patient found to have acute pancreatitis with CT abdomen showing inflammation of the pancreatic body and tail with air-fluid collection.   Subjective:   Reports still having epigastric pain better with pain medication.  Nauseous but no vomiting.  Assessment  & Plan :    Principal Problem:   Alcoholic alcoholic pancreatitis  Aggressive IV hydration with Ringer's lactate.  IV morphine PRN for pain.  Zofran for nausea.  Lipase in a.m. clear liquid diet. Serial abdominal exam.  Counseled strongly on alcohol cessation.  Active Problems:   Alcohol abuse No signs of withdrawal at present.  Continue to monitor with CIWA.  Continue thiamine, folate and multivitamin.  Counseled strongly on cessation.   History of bilateral nephrolithiasis Again seen on CT abdomen without obstruction    Essential hypertension  continue home dose amlodipine and Coreg.  PRN hydralazine.  GERD Continue PPI  Leukocytosis Suspect reactive. Recheck in a.m.  Code Status : Full code  Family Communication  :  None at bedside  Disposition Plan  : Home possibly in the next 48-72 hours if clinically improved tolerating advance diet  Barriers For Discharge : Active symptoms  Consults  : None  Procedures  : CT abdomen  DVT Prophylaxis  :  Lovenox -   Lab Results  Component Value Date   PLT 238 05/20/2018    Antibiotics  :   Anti-infectives (From admission, onward)   None        Objective:  Vitals:   05/19/18 2215 05/20/18 0045 05/20/18 0200 05/20/18 0500  BP: (!) 152/91 (!) 153/94  (!) 151/105  Pulse: 91 (!) 107  95  Resp:  18  16  Temp:    98.6 F (37 C)  TempSrc:    Oral  SpO2: 97% 98%  98%  Weight:   109.7 kg 111.4 kg  Height:   6' (1.829 m)     Wt Readings from Last 3 Encounters:  05/20/18 111.4 kg  03/27/18 109.7 kg  03/25/18 109.7 kg     Intake/Output Summary (Last 24 hours) at 05/20/2018 1105 Last data filed at 05/20/2018 0900 Gross per 24 hour  Intake 1120 ml  Output -  Net 1120 ml     Physical Exam  Gen: not in distress, fatigue HEENT: no pallor, no icterus, dry mucosa, supple Chest: clear b/l, no added sounds CVS: N S1&S2, no murmurs,  GI: soft, nondistended, bowel sounds present, epigastric tenderness to palpation Musculoskeletal: warm, no edema     Data Review:    CBC Recent Labs  Lab 05/19/18 1800 05/20/18 0303  WBC 12.7* 16.3*  HGB 13.1 12.6*  HCT 37.1* 37.5*  PLT 308 238  MCV 94.6 93.1  MCH 33.4 31.3  MCHC 35.3 33.6  RDW 12.7 12.4    Chemistries  Recent Labs  Lab 05/19/18 1800 05/20/18 0303  NA 134* 138  K 4.1 3.6  CL 103 105  CO2 17* 19*  GLUCOSE 124* 120*  BUN 26* 20  CREATININE 1.05 1.23  CALCIUM 9.2 9.2  AST 76* 45*  ALT 35 36  ALKPHOS 34* 32*  BILITOT 1.7* 1.6*   ------------------------------------------------------------------------------------------------------------------ No results for input(s): CHOL, HDL, LDLCALC, TRIG, CHOLHDL, LDLDIRECT in the last 72 hours.  Lab Results  Component Value Date    HGBA1C 5.4 03/02/2014   ------------------------------------------------------------------------------------------------------------------ No results for input(s): TSH, T4TOTAL, T3FREE, THYROIDAB in the last 72 hours.  Invalid input(s): FREET3 ------------------------------------------------------------------------------------------------------------------ No results for input(s): VITAMINB12, FOLATE, FERRITIN, TIBC, IRON, RETICCTPCT in the last 72 hours.  Coagulation profile No results for input(s): INR, PROTIME in the last 168 hours.  No results for input(s): DDIMER in the last 72 hours.  Cardiac Enzymes No results for input(s): CKMB, TROPONINI, MYOGLOBIN in the last 168 hours.  Invalid input(s): CK ------------------------------------------------------------------------------------------------------------------    Component Value Date/Time   BNP 11.6 04/30/2014 0159    Inpatient Medications  Scheduled Meds: . amLODipine  10 mg Oral Daily  . carvedilol  12.5 mg Oral BID WC  . enoxaparin (LOVENOX) injection  40 mg Subcutaneous Q24H  . folic acid  1 mg Oral Daily  . multivitamin with minerals  1 tablet Oral Daily  . pantoprazole  40 mg Oral Daily  . thiamine  100 mg Oral Daily   Or  . thiamine  100 mg Intravenous Daily   Continuous Infusions: . lactated ringers 200 mL/hr at 05/20/18 0730   PRN Meds:.acetaminophen, LORazepam **OR** LORazepam, Melatonin, morphine injection, ondansetron (ZOFRAN) IV  Micro Results No results found for this or any previous visit (from the past 240 hour(s)).  Radiology Reports Ct Abdomen Pelvis W Contrast  Result Date: 05/19/2018 CLINICAL DATA:  58 year old male with right abdominal pain radiating to the flank since last night. Nausea vomiting. Abdominal distension. EXAM: CT ABDOMEN AND PELVIS WITH CONTRAST TECHNIQUE: Multidetector CT imaging of the abdomen and pelvis was performed using the standard protocol following bolus administration of  intravenous contrast. CONTRAST:  164mL OMNIPAQUE IOHEXOL 300 MG/ML  SOLN COMPARISON:  CT  Abdomen and Pelvis 10/06/2016 and earlier. FINDINGS: Lower chest: Negative. No pericardial or pleural effusion. Hepatobiliary: Hepatic steatosis. Negative gallbladder. Pancreas: Inflamed pancreatic body and tail (series 3, image 31) with peripancreatic inflammatory stranding. No pancreatic necrosis identified. No associated fluid collection. Spleen: Negative aside from pancreatic inflammation at the splenic hilum. Adrenals/Urinary Tract: Adrenal glands are within normal limits. Bilateral nephrolithiasis, largest stone is 4-5 millimeters in the right lower pole. Symmetric bilateral renal enhancement and contrast excretion. No acute perinephric stranding. Both ureters are within normal limits. Diminutive, unremarkable urinary bladder. Stomach/Bowel: Redundant sigmoid colon which tracks into the left upper quadrant and epigastrium. Mild to moderate sigmoid and descending colon diverticulosis. No associated inflammation. Decompressed transverse colon. Moderate diverticulosis at the hepatic flexure without active inflammation. The cecum is on a lax mesentery. Normal appendix (series 3, image 62). Negative terminal ileum. No dilated small bowel. Stomach and duodenum remain within normal limits. No free air. No free fluid. Vascular/Lymphatic: Aortoiliac calcified atherosclerosis. Major arterial structures remain patent. The portal venous system is patent. No lymphadenopathy. Reproductive: Negative. Other: No pelvic free fluid. Musculoskeletal: Lower lumbar spine posterior fusion sequelae. No acute osseous abnormality identified. IMPRESSION: 1. Acute pancreatitis. Inflammation about the pancreatic body and tail with no fluid collection or complicating features. 2. Bilateral nephrolithiasis but no obstructive uropathy. 3. Fatty liver disease.  Large bowel diverticulosis. 4. Aortic Atherosclerosis (ICD10-I70.0). Electronically Signed    By: Genevie Ann M.D.   On: 05/19/2018 23:03    Time Spent in minutes  25   Delayni Streed M.D on 05/20/2018 at 11:05 AM  Between 7am to 7pm - Pager - [phone]  After 7pm go to [email or web address] - password  [facility]  [facility] -  Office  [phone]

## 2018-05-21 DIAGNOSIS — F1010 Alcohol abuse, uncomplicated: Secondary | ICD-10-CM

## 2018-05-21 DIAGNOSIS — K859 Acute pancreatitis without necrosis or infection, unspecified: Secondary | ICD-10-CM

## 2018-05-21 DIAGNOSIS — I10 Essential (primary) hypertension: Secondary | ICD-10-CM

## 2018-05-21 LAB — CBC
HCT: 34.8 % — ABNORMAL LOW (ref 39.0–52.0)
Hemoglobin: 11.8 g/dL — ABNORMAL LOW (ref 13.0–17.0)
MCH: 31.1 pg (ref 26.0–34.0)
MCHC: 33.9 g/dL (ref 30.0–36.0)
MCV: 91.6 fL (ref 80.0–100.0)
Platelets: 202 10*3/uL (ref 150–400)
RBC: 3.8 MIL/uL — AB (ref 4.22–5.81)
RDW: 12.3 % (ref 11.5–15.5)
WBC: 11.3 10*3/uL — ABNORMAL HIGH (ref 4.0–10.5)
nRBC: 0 % (ref 0.0–0.2)

## 2018-05-21 LAB — COMPREHENSIVE METABOLIC PANEL
ALT: 25 U/L (ref 0–44)
AST: 27 U/L (ref 15–41)
Albumin: 3.7 g/dL (ref 3.5–5.0)
Alkaline Phosphatase: 33 U/L — ABNORMAL LOW (ref 38–126)
Anion gap: 12 (ref 5–15)
BUN: 7 mg/dL (ref 6–20)
CO2: 23 mmol/L (ref 22–32)
Calcium: 8.8 mg/dL — ABNORMAL LOW (ref 8.9–10.3)
Chloride: 101 mmol/L (ref 98–111)
Creatinine, Ser: 1.1 mg/dL (ref 0.61–1.24)
GFR calc Af Amer: >60 mL/min (ref >60)
GFR calc non Af Amer: >60 mL/min (ref >60)
Glucose, Bld: 121 mg/dL — ABNORMAL HIGH (ref 70–99)
POTASSIUM: 2.8 mmol/L — AB (ref 3.5–5.1)
Sodium: 136 mmol/L (ref 135–145)
Total Bilirubin: 1.5 mg/dL — ABNORMAL HIGH (ref 0.3–1.2)
Total Protein: 6.6 g/dL (ref 6.5–8.1)

## 2018-05-21 LAB — MAGNESIUM: Magnesium: 1.2 mg/dL — ABNORMAL LOW (ref 1.7–2.4)

## 2018-05-21 LAB — LIPASE, BLOOD: Lipase: 80 U/L — ABNORMAL HIGH (ref 11–51)

## 2018-05-21 MED ORDER — MAGNESIUM SULFATE 2 GM/50ML IV SOLN
2.0000 g | Freq: Once | INTRAVENOUS | Status: AC
  Administered 2018-05-21: 2 g via INTRAVENOUS
  Filled 2018-05-21: qty 50

## 2018-05-21 MED ORDER — POTASSIUM CHLORIDE 10 MEQ/100ML IV SOLN
10.0000 meq | INTRAVENOUS | Status: AC
  Administered 2018-05-21 (×4): 10 meq via INTRAVENOUS
  Filled 2018-05-21 (×4): qty 100

## 2018-05-21 MED ORDER — POTASSIUM CHLORIDE CRYS ER 20 MEQ PO TBCR
40.0000 meq | EXTENDED_RELEASE_TABLET | ORAL | Status: AC
  Administered 2018-05-21 (×2): 40 meq via ORAL
  Filled 2018-05-21 (×2): qty 2

## 2018-05-21 MED ORDER — POTASSIUM CHLORIDE 10 MEQ/100ML IV SOLN
10.0000 meq | Freq: Once | INTRAVENOUS | Status: AC
  Administered 2018-05-21: 10 meq via INTRAVENOUS

## 2018-05-21 NOTE — Progress Notes (Signed)
Potassium 2.8, kirby, NP notified.

## 2018-05-21 NOTE — Progress Notes (Signed)
[facility] PROGRESS NOTE    Progress Note  [name]  LOV:[id number] DOB: 1960-07-16 DOA: 05/19/2018 PCP: Clent Demark, PA-C     Brief Narrative:   [name] is an 58 y.o. male past medical history of alcohol abuse, multiple admissions for pancreatitis comes in for alcoholic pancreatitis. CT scan of the abdomen and pelvis showed inflamed pancreas body and tail with no air and fluid collection  Assessment/Plan:   Alcoholic pancreatitis Keep the patient n.p.o., continue IV fluids IV narcotics for pain and Zofran for nausea. Has remained afebrile, leukocytosis improving. Patient relates that he is clear liquid diet is causing him abdominal pain.  Alcohol abuse Counseling.  Presently no signs of withdrawal continue to monitor with Ativan protocol. He was given thiamine and folate on admission.  NEPHROLITHIASIS, HX OF Again seen on CT scan of the abdomen without obstruction follow-up with nephrology as an outpatient. Minimal hematuria.  Essential hypertension Continue Coreg and amlodipine.   DVT prophylaxis: lovenxo Family Communication:none Disposition Plan/Barrier to D/C: home in 2-3 day Code Status:     Code Status Orders  (From admission, onward)         Start     Ordered   05/20/18 0234  Full code  Continuous     05/20/18 0234        Code Status History    Date Active Date Inactive Code Status Order ID Comments User Context   03/27/2018 1310 03/28/2018 1737 Full Code [id number]  Manfred Arch Inpatient   08/30/2014 1331 08/31/2014 0346 Full Code [id number]  Logan Bores, MD H[facility]        IV Access:    Peripheral IV   Procedures and diagnostic studies:   Ct Abdomen Pelvis W Contrast  Result Date: 05/19/2018 CLINICAL DATA:  58 year old male with right abdominal pain radiating to the flank since last night. Nausea vomiting. Abdominal distension. EXAM: CT ABDOMEN AND PELVIS WITH CONTRAST TECHNIQUE: Multidetector CT imaging of  the abdomen and pelvis was performed using the standard protocol following bolus administration of intravenous contrast. CONTRAST:  158mL OMNIPAQUE IOHEXOL 300 MG/ML  SOLN COMPARISON:  CT Abdomen and Pelvis 10/06/2016 and earlier. FINDINGS: Lower chest: Negative. No pericardial or pleural effusion. Hepatobiliary: Hepatic steatosis. Negative gallbladder. Pancreas: Inflamed pancreatic body and tail (series 3, image 31) with peripancreatic inflammatory stranding. No pancreatic necrosis identified. No associated fluid collection. Spleen: Negative aside from pancreatic inflammation at the splenic hilum. Adrenals/Urinary Tract: Adrenal glands are within normal limits. Bilateral nephrolithiasis, largest stone is 4-5 millimeters in the right lower pole. Symmetric bilateral renal enhancement and contrast excretion. No acute perinephric stranding. Both ureters are within normal limits. Diminutive, unremarkable urinary bladder. Stomach/Bowel: Redundant sigmoid colon which tracks into the left upper quadrant and epigastrium. Mild to moderate sigmoid and descending colon diverticulosis. No associated inflammation. Decompressed transverse colon. Moderate diverticulosis at the hepatic flexure without active inflammation. The cecum is on a lax mesentery. Normal appendix (series 3, image 62). Negative terminal ileum. No dilated small bowel. Stomach and duodenum remain within normal limits. No free air. No free fluid. Vascular/Lymphatic: Aortoiliac calcified atherosclerosis. Major arterial structures remain patent. The portal venous system is patent. No lymphadenopathy. Reproductive: Negative. Other: No pelvic free fluid. Musculoskeletal: Lower lumbar spine posterior fusion sequelae. No acute osseous abnormality identified. IMPRESSION: 1. Acute pancreatitis. Inflammation about the pancreatic body and tail with no fluid collection or complicating features. 2. Bilateral nephrolithiasis but no obstructive uropathy. 3. Fatty liver  disease.  Large bowel diverticulosis.  4. Aortic Atherosclerosis (ICD10-I70.0). Electronically Signed   By: Genevie Ann M.D.   On: 05/19/2018 23:03     Medical Consultants:    None.  Anti-Infectives:   NOne  Subjective:    [name] he relates he continues to have abdominal pain that radiates to his back, made worse by his clear liquid diet.  Objective:    Vitals:   05/20/18 1400 05/20/18 1554 05/20/18 2336 05/21/18 0459  BP: (!) 160/106 (!) 156/105 (!) 156/96 (!) 163/106  Pulse: 89 85 85 80  Resp: 16 20 18 20   Temp: 98.8 F (37.1 C) 99.3 F (37.4 C) 99.1 F (37.3 C) 98.5 F (36.9 C)  TempSrc: Oral Oral Oral Oral  SpO2: 95% 98% 97% 96%  Weight:      Height:        Intake/Output Summary (Last 24 hours) at 05/21/2018 1113 Last data filed at 05/21/2018 0329 Gross per 24 hour  Intake 732 ml  Output -  Net 732 ml   Filed Weights   05/20/18 0200 05/20/18 0500  Weight: 109.7 kg 111.4 kg    Exam: General exam: In no acute distress. Respiratory system: Good air movement and clear to auscultation. Cardiovascular system: S1 & S2 heard, RRR.  Gastrointestinal system: Positive bowel sounds, soft, with epigastric pain no rebound or guarding Central nervous system: Alert and oriented. No focal neurological deficits. Extremities: No pedal edema. Skin: No rashes, lesions or ulcers Psychiatry: Judgement and insight appear normal. Mood & affect appropriate.    Data Reviewed:    Labs: Basic Metabolic Panel: Recent Labs  Lab 05/19/18 1800 05/20/18 0303 05/21/18 0346 05/21/18 0541  NA 134* 138 136  --   K 4.1 3.6 2.8*  --   CL 103 105 101  --   CO2 17* 19* 23  --   GLUCOSE 124* 120* 121*  --   BUN 26* 20 7  --   CREATININE 1.05 1.23 1.10  --   CALCIUM 9.2 9.2 8.8*  --   MG  --   --   --  1.2*   GFR Estimated Creatinine Clearance: 96.6 mL/min (by C-G formula based on SCr of 1.1 mg/dL). Liver Function Tests: Recent Labs  Lab 05/19/18 1800 05/20/18 0303  05/21/18 0346  AST 76* 45* 27  ALT 35 36 25  ALKPHOS 34* 32* 33*  BILITOT 1.7* 1.6* 1.5*  PROT RESULTS UNAVAILABLE DUE TO INTERFERING SUBSTANCE 7.5 6.6  ALBUMIN 4.6 4.5 3.7   Recent Labs  Lab 05/19/18 1800 05/21/18 0346  LIPASE 146* 80*   No results for input(s): AMMONIA in the last 168 hours. Coagulation profile No results for input(s): INR, PROTIME in the last 168 hours.  CBC: Recent Labs  Lab 05/19/18 1800 05/20/18 0303 05/21/18 0346  WBC 12.7* 16.3* 11.3*  HGB 13.1 12.6* 11.8*  HCT 37.1* 37.5* 34.8*  MCV 94.6 93.1 91.6  PLT 308 238 202   Cardiac Enzymes: No results for input(s): CKTOTAL, CKMB, CKMBINDEX, TROPONINI in the last 168 hours. BNP (last 3 results) No results for input(s): PROBNP in the last 8760 hours. CBG: No results for input(s): GLUCAP in the last 168 hours. D-Dimer: No results for input(s): DDIMER in the last 72 hours. Hgb A1c: No results for input(s): HGBA1C in the last 72 hours. Lipid Profile: No results for input(s): CHOL, HDL, LDLCALC, TRIG, CHOLHDL, LDLDIRECT in the last 72 hours. Thyroid function studies: No results for input(s): TSH, T4TOTAL, T3FREE, THYROIDAB in the last 72 hours.  Invalid input(s): FREET3 Anemia work up: No results for input(s): VITAMINB12, FOLATE, FERRITIN, TIBC, IRON, RETICCTPCT in the last 72 hours. Sepsis Labs: Recent Labs  Lab 05/19/18 1800 05/20/18 0303 05/21/18 0346  WBC 12.7* 16.3* 11.3*   Microbiology No results found for this or any previous visit (from the past 240 hour(s)).   Medications:   . amLODipine  10 mg Oral Daily  . carvedilol  12.5 mg Oral BID WC  . enoxaparin (LOVENOX) injection  40 mg Subcutaneous Q24H  . folic acid  1 mg Oral Daily  . multivitamin with minerals  1 tablet Oral Daily  . pantoprazole  40 mg Oral Daily  . thiamine  100 mg Oral Daily   Or  . thiamine  100 mg Intravenous Daily   Continuous Infusions: . lactated ringers 150 mL/hr at 05/21/18 0911     LOS: 0 days     Charlynne Cousins  [facility]   *Please refer to [email or web address], password [email or web address] to get updated schedule on who will round on this patient, as hospitalists switch teams weekly. If 7PM-7AM, please contact night-coverage at [email or web address], password [email or web address] for any overnight needs.  05/21/2018, 11:13 AM

## 2018-05-22 LAB — BASIC METABOLIC PANEL
Anion gap: 9 (ref 5–15)
BUN: 5 mg/dL — AB (ref 6–20)
CO2: 25 mmol/L (ref 22–32)
Calcium: 8.8 mg/dL — ABNORMAL LOW (ref 8.9–10.3)
Chloride: 104 mmol/L (ref 98–111)
Creatinine, Ser: 1.16 mg/dL (ref 0.61–1.24)
GFR calc Af Amer: >60 mL/min (ref >60)
GFR calc non Af Amer: >60 mL/min (ref >60)
Glucose, Bld: 106 mg/dL — ABNORMAL HIGH (ref 70–99)
Potassium: 3.2 mmol/L — ABNORMAL LOW (ref 3.5–5.1)
Sodium: 138 mmol/L (ref 135–145)

## 2018-05-22 MED ORDER — KCL IN DEXTROSE-NACL 10-5-0.45 MEQ/L-%-% IV SOLN
INTRAVENOUS | Status: DC
  Administered 2018-05-22 – 2018-05-23 (×4): via INTRAVENOUS
  Filled 2018-05-22 (×6): qty 1000

## 2018-05-22 MED ORDER — MORPHINE SULFATE (PF) 2 MG/ML IV SOLN
2.0000 mg | INTRAVENOUS | Status: DC | PRN
  Administered 2018-05-22 (×5): 2 mg via INTRAVENOUS
  Filled 2018-05-22 (×4): qty 1

## 2018-05-22 MED ORDER — POTASSIUM CHLORIDE 2 MEQ/ML IV SOLN
INTRAVENOUS | Status: DC
  Administered 2018-05-22: 14:00:00 via INTRAVENOUS
  Filled 2018-05-22 (×4): qty 1000

## 2018-05-22 MED ORDER — POTASSIUM CHLORIDE 10 MEQ/100ML IV SOLN
10.0000 meq | INTRAVENOUS | Status: AC
  Administered 2018-05-22 (×5): 10 meq via INTRAVENOUS
  Filled 2018-05-22 (×5): qty 100

## 2018-05-22 MED ORDER — SODIUM CHLORIDE 0.9 % IV SOLN: INTRAVENOUS | Status: DC | PRN

## 2018-05-22 NOTE — Progress Notes (Signed)
[facility] PROGRESS NOTE    Progress Note  [name]  PZW:[id number] DOB: [birth year] DOA: 05/19/2018 PCP: Clent Demark, PA-C     Brief Narrative:   [name] is an 58 y.o. male past medical history of alcohol abuse, multiple admissions for pancreatitis comes in for alcoholic pancreatitis. CT scan of the abdomen and pelvis showed inflamed pancreas body and tail with no air and fluid collection  Assessment/Plan:   Alcoholic pancreatitis Keep the patient n.p.o., continue IV fluids, IV narcotics for pain and Zofran for nausea. Has remained afebrile, leukocytosis improving. Still requiring narcotics for pain, as is it not controlled.  Alcohol abuse Counseling.  Presently no signs of withdrawal continue to monitor with Ativan protocol. He was given thiamine and folate on admission.  NEPHROLITHIASIS, HX OF Again seen on CT scan of the abdomen without obstruction follow-up with nephrology as an outpatient. Minimal hematuria.  Essential hypertension Continue Coreg and amlodipine.  Hypokalemia: Cont to replete IV recheck in  The morning.   DVT prophylaxis: lovenxo Family Communication:none Disposition Plan/Barrier to D/C: home in 2-3 day Code Status:     Code Status Orders  (From admission, onward)         Start     Ordered   05/20/18 0234  Full code  Continuous     05/20/18 0234        Code Status History    Date Active Date Inactive Code Status Order ID Comments User Context   03/27/2018 1310 03/28/2018 1737 Full Code [id number]  Manfred Arch Inpatient   08/30/2014 1331 08/31/2014 0346 Full Code [id number]  Logan Bores, MD [facility]        IV Access:    Peripheral IV   Procedures and diagnostic studies:   No results found.   Medical Consultants:    None.  Anti-Infectives:   NOne  Subjective:    Fleeta Emmer cont to have abdominal pain, with radiation to his back  Objective:    Vitals:   05/21/18 1924 05/21/18  2230 05/22/18 0605 05/22/18 0821  BP: (!) 149/106 (!) 145/95 (!) 135/92 (!) 133/101  Pulse: 72 66 68 66  Resp: 18 18 18 18   Temp: 99 F (37.2 C) 98 F (36.7 C) 98.4 F (36.9 C) 98.4 F (36.9 C)  TempSrc: Oral Oral Oral Oral  SpO2: 97% 96% 95% 99%  Weight:      Height:        Intake/Output Summary (Last 24 hours) at 05/22/2018 1052 Last data filed at 05/22/2018 0646 Gross per 24 hour  Intake 519.17 ml  Output 1 ml  Net 518.17 ml   Filed Weights   05/20/18 0200 05/20/18 0500  Weight: 109.7 kg 111.4 kg    Exam: General exam: In no acute distress. Respiratory system: Good air movement and clear to auscultation. Cardiovascular system: S1 & S2 heard, RRR.  Gastrointestinal system: Positive bowel sounds, soft, with epigastric pain no rebound or guarding Central nervous system: Alert and oriented. No focal neurological deficits. Extremities: No pedal edema. Skin: No rashes, lesions or ulcers Psychiatry: Judgement and insight appear normal. Mood & affect appropriate.    Data Reviewed:    Labs: Basic Metabolic Panel: Recent Labs  Lab 05/19/18 1800 05/20/18 0303 05/21/18 0346 05/21/18 0541 05/22/18 0500  NA 134* 138 136  --  138  K 4.1 3.6 2.8*  --  3.2*  CL 103 105 101  --  104  CO2 17* 19* 23  --  25  GLUCOSE 124* 120* 121*  --  106*  BUN 26* 20 7  --  5*  CREATININE 1.05 1.23 1.10  --  1.16  CALCIUM 9.2 9.2 8.8*  --  8.8*  MG  --   --   --  1.2*  --    GFR Estimated Creatinine Clearance: 91.6 mL/min (by C-G formula based on SCr of 1.16 mg/dL). Liver Function Tests: Recent Labs  Lab 05/19/18 1800 05/20/18 0303 05/21/18 0346  AST 76* 45* 27  ALT 35 36 25  ALKPHOS 34* 32* 33*  BILITOT 1.7* 1.6* 1.5*  PROT RESULTS UNAVAILABLE DUE TO INTERFERING SUBSTANCE 7.5 6.6  ALBUMIN 4.6 4.5 3.7   Recent Labs  Lab 05/19/18 1800 05/21/18 0346  LIPASE 146* 80*   No results for input(s): AMMONIA in the last 168 hours. Coagulation profile No results for input(s):  INR, PROTIME in the last 168 hours.  CBC: Recent Labs  Lab 05/19/18 1800 05/20/18 0303 05/21/18 0346  WBC 12.7* 16.3* 11.3*  HGB 13.1 12.6* 11.8*  HCT 37.1* 37.5* 34.8*  MCV 94.6 93.1 91.6  PLT 308 238 202   Cardiac Enzymes: No results for input(s): CKTOTAL, CKMB, CKMBINDEX, TROPONINI in the last 168 hours. BNP (last 3 results) No results for input(s): PROBNP in the last 8760 hours. CBG: No results for input(s): GLUCAP in the last 168 hours. D-Dimer: No results for input(s): DDIMER in the last 72 hours. Hgb A1c: No results for input(s): HGBA1C in the last 72 hours. Lipid Profile: No results for input(s): CHOL, HDL, LDLCALC, TRIG, CHOLHDL, LDLDIRECT in the last 72 hours. Thyroid function studies: No results for input(s): TSH, T4TOTAL, T3FREE, THYROIDAB in the last 72 hours.  Invalid input(s): FREET3 Anemia work up: No results for input(s): VITAMINB12, FOLATE, FERRITIN, TIBC, IRON, RETICCTPCT in the last 72 hours. Sepsis Labs: Recent Labs  Lab 05/19/18 1800 05/20/18 0303 05/21/18 0346  WBC 12.7* 16.3* 11.3*   Microbiology No results found for this or any previous visit (from the past 240 hour(s)).   Medications:   . amLODipine  10 mg Oral Daily  . carvedilol  12.5 mg Oral BID WC  . enoxaparin (LOVENOX) injection  40 mg Subcutaneous Q24H  . folic acid  1 mg Oral Daily  . multivitamin with minerals  1 tablet Oral Daily  . pantoprazole  40 mg Oral Daily  . thiamine  100 mg Oral Daily   Or  . thiamine  100 mg Intravenous Daily   Continuous Infusions: . sodium chloride Stopped (05/22/18 0025)     LOS: 1 day   Charlynne Cousins  [facility]   *Please refer to [email or web address], password [email or web address] to get updated schedule on who will round on this patient, as hospitalists switch teams weekly. If 7PM-7AM, please contact night-coverage at [email or web address], password [email or web address] for any overnight needs.  05/22/2018, 10:52 AM

## 2018-05-23 LAB — BASIC METABOLIC PANEL
Anion gap: 8 (ref 5–15)
BUN: 7 mg/dL (ref 6–20)
CO2: 26 mmol/L (ref 22–32)
Calcium: 8.6 mg/dL — ABNORMAL LOW (ref 8.9–10.3)
Chloride: 104 mmol/L (ref 98–111)
Creatinine, Ser: 1.17 mg/dL (ref 0.61–1.24)
GFR calc non Af Amer: >60 mL/min (ref >60)
Glucose, Bld: 119 mg/dL — ABNORMAL HIGH (ref 70–99)
Potassium: 3.4 mmol/L — ABNORMAL LOW (ref 3.5–5.1)
Sodium: 138 mmol/L (ref 135–145)

## 2018-05-23 NOTE — Progress Notes (Signed)
[facility] PROGRESS NOTE    Progress Note  [name]  GYK:[id number] DOB: 1961-03-02 DOA: 05/19/2018 PCP: Clent Demark, PA-C     Brief Narrative:   [name] is an 58 y.o. male past medical history of alcohol abuse, multiple admissions for pancreatitis comes in for alcoholic pancreatitis. CT scan of the abdomen and pelvis showed inflamed pancreas body and tail with no air and fluid collection  Assessment/Plan:   Alcoholic pancreatitis: Minimal pain, no narcotics since yesterday. Will start a clear liq diet, d/c narcotics. Has remained afebrile, leukocytosis improving.  Alcohol abuse: Counseling.  Presently no signs of withdrawal continue to monitor with Ativan protocol. He was given thiamine and folate on admission.  NEPHROLITHIASIS, HX OF Again seen on CT scan of the abdomen without obstruction follow-up with nephrology as an outpatient. Hematuria resolved, he relates he passed a small stone.  Essential hypertension Continue Coreg and amlodipine.  Hypokalemia: Improving, cont IV repletion.   DVT prophylaxis: lovenxo Family Communication:none Disposition Plan/Barrier to D/C: once tolerating Code Status:     Code Status Orders  (From admission, onward)         Start     Ordered   05/20/18 0234  Full code  Continuous     05/20/18 0234        Code Status History    Date Active Date Inactive Code Status Order ID Comments User Context   03/27/2018 1310 03/28/2018 1737 Full Code [id number]  Manfred Arch Inpatient   08/30/2014 1331 08/31/2014 0346 Full Code [id number]  Logan Bores, MD [facility]        IV Access:    Peripheral IV   Procedures and diagnostic studies:   No results found.   Medical Consultants:    None.  Anti-Infectives:   NOne  Subjective:    [name] feels better, has not required narcotics. Wants to try clear liq diet.  Objective:    Vitals:   05/22/18 1505 05/22/18 2101 05/23/18 0037  05/23/18 0539  BP: 121/80 (!) 138/92 (!) 120/95 (!) 134/92  Pulse: 73 68 68 64  Resp: (!) 22 18  16   Temp: 98.3 F (36.8 C) 99.6 F (37.6 C) 99.5 F (37.5 C) 98.8 F (37.1 C)  TempSrc: Oral Oral Oral Oral  SpO2: 96% 97% 96% 95%  Weight:      Height:        Intake/Output Summary (Last 24 hours) at 05/23/2018 0929 Last data filed at 05/23/2018 6333 Gross per 24 hour  Intake 1380 ml  Output -  Net 1380 ml   Filed Weights   05/20/18 0200 05/20/18 0500  Weight: 109.7 kg 111.4 kg    Exam: General exam: In no acute distress. Respiratory system: Good air movement and clear to auscultation. Cardiovascular system: S1 & S2 heard, RRR.  Gastrointestinal system: Positive bowel sounds, soft, with epigastric pain no rebound or guarding Central nervous system: Alert and oriented. No focal neurological deficits. Extremities: No pedal edema. Skin: No rashes, lesions or ulcers Psychiatry: Judgement and insight appear normal. Mood & affect appropriate.    Data Reviewed:    Labs: Basic Metabolic Panel: Recent Labs  Lab 05/19/18 1800 05/20/18 0303 05/21/18 0346 05/21/18 0541 05/22/18 0500 05/23/18 0433  NA 134* 138 136  --  138 138  K 4.1 3.6 2.8*  --  3.2* 3.4*  CL 103 105 101  --  104 104  CO2 17* 19* 23  --  25 26  GLUCOSE  124* 120* 121*  --  106* 119*  BUN 26* 20 7  --  5* 7  CREATININE 1.05 1.23 1.10  --  1.16 1.17  CALCIUM 9.2 9.2 8.8*  --  8.8* 8.6*  MG  --   --   --  1.2*  --   --    GFR Estimated Creatinine Clearance: 90.8 mL/min (by C-G formula based on SCr of 1.17 mg/dL). Liver Function Tests: Recent Labs  Lab 05/19/18 1800 05/20/18 0303 05/21/18 0346  AST 76* 45* 27  ALT 35 36 25  ALKPHOS 34* 32* 33*  BILITOT 1.7* 1.6* 1.5*  PROT RESULTS UNAVAILABLE DUE TO INTERFERING SUBSTANCE 7.5 6.6  ALBUMIN 4.6 4.5 3.7   Recent Labs  Lab 05/19/18 1800 05/21/18 0346  LIPASE 146* 80*   No results for input(s): AMMONIA in the last 168 hours. Coagulation  profile No results for input(s): INR, PROTIME in the last 168 hours.  CBC: Recent Labs  Lab 05/19/18 1800 05/20/18 0303 05/21/18 0346  WBC 12.7* 16.3* 11.3*  HGB 13.1 12.6* 11.8*  HCT 37.1* 37.5* 34.8*  MCV 94.6 93.1 91.6  PLT 308 238 202   Cardiac Enzymes: No results for input(s): CKTOTAL, CKMB, CKMBINDEX, TROPONINI in the last 168 hours. BNP (last 3 results) No results for input(s): PROBNP in the last 8760 hours. CBG: No results for input(s): GLUCAP in the last 168 hours. D-Dimer: No results for input(s): DDIMER in the last 72 hours. Hgb A1c: No results for input(s): HGBA1C in the last 72 hours. Lipid Profile: No results for input(s): CHOL, HDL, LDLCALC, TRIG, CHOLHDL, LDLDIRECT in the last 72 hours. Thyroid function studies: No results for input(s): TSH, T4TOTAL, T3FREE, THYROIDAB in the last 72 hours.  Invalid input(s): FREET3 Anemia work up: No results for input(s): VITAMINB12, FOLATE, FERRITIN, TIBC, IRON, RETICCTPCT in the last 72 hours. Sepsis Labs: Recent Labs  Lab 05/19/18 1800 05/20/18 0303 05/21/18 0346  WBC 12.7* 16.3* 11.3*   Microbiology No results found for this or any previous visit (from the past 240 hour(s)).   Medications:   . amLODipine  10 mg Oral Daily  . carvedilol  12.5 mg Oral BID WC  . enoxaparin (LOVENOX) injection  40 mg Subcutaneous Q24H  . folic acid  1 mg Oral Daily  . multivitamin with minerals  1 tablet Oral Daily  . pantoprazole  40 mg Oral Daily  . thiamine  100 mg Oral Daily   Or  . thiamine  100 mg Intravenous Daily   Continuous Infusions: . sodium chloride Stopped (05/22/18 0025)  . dextrose 5 % and 0.45 % NaCl with KCl 10 mEq/L 100 mL/hr at 05/23/18 0033     LOS: 2 days   Charlynne Cousins  [facility]   *Please refer to [email or web address], password [email or web address] to get updated schedule on who will round on this patient, as hospitalists switch teams weekly. If 7PM-7AM, please contact night-coverage at [email or web address],  password [email or web address] for any overnight needs.  05/23/2018, 9:29 AM

## 2018-05-24 LAB — BASIC METABOLIC PANEL
Anion gap: 12 (ref 5–15)
BUN: 5 mg/dL — ABNORMAL LOW (ref 6–20)
CO2: 24 mmol/L (ref 22–32)
Calcium: 8.7 mg/dL — ABNORMAL LOW (ref 8.9–10.3)
Chloride: 104 mmol/L (ref 98–111)
Creatinine, Ser: 1.2 mg/dL (ref 0.61–1.24)
GFR calc non Af Amer: >60 mL/min (ref >60)
GLUCOSE: 113 mg/dL — AB (ref 70–99)
Potassium: 3.2 mmol/L — ABNORMAL LOW (ref 3.5–5.1)
Sodium: 140 mmol/L (ref 135–145)

## 2018-05-24 MED ORDER — MAGNESIUM OXIDE 400 (241.3 MG) MG PO TABS
400.0000 mg | ORAL_TABLET | Freq: Two times a day (BID) | ORAL | Status: DC
  Administered 2018-05-24: 400 mg via ORAL
  Filled 2018-05-24: qty 1

## 2018-05-24 MED ORDER — POTASSIUM CHLORIDE CRYS ER 20 MEQ PO TBCR
40.0000 meq | EXTENDED_RELEASE_TABLET | Freq: Two times a day (BID) | ORAL | Status: DC
  Administered 2018-05-24: 40 meq via ORAL
  Filled 2018-05-24: qty 2

## 2018-05-24 NOTE — Discharge Instructions (Signed)
Acute Pancreatitis ° °Acute pancreatitis happens when the pancreas gets swollen. The pancreas is a large gland behind the stomach. The pancreas helps control blood sugar. It also makes enzymes that help digest food. This condition happens when the enzymes attack the pancreas and damage it. Most attacks last a couple of days and are dangerous. The lungs, heart, and kidneys may stop working. °What are the causes? °· Alcohol abuse. °· Drug abuse. °· Gallstones. °· Some medicines. °· Some chemicals. °· Infection. °· Damage caused by an accident.. °· Belly (abdominal) surgery. °· In some cases, the cause is not known. °What are the signs or symptoms? °· Pain in the upper belly and back. °· Swelling of the belly °· Feeling sick to your stomach (nausea) and throwing up (vomiting). °How is this treated? °· You will probably have to stay in the hospital. °? Treatment may include: °§ Fluid through an IV. °§ A tube to remove stomach contents and stop you from throwing up. °§ Not eating for 3-4 days. °§ Pain medicine. °§ Antibiotic medicines if you have an infection. °§ Surgery on the pancreas or gallbladder. °Follow these instructions at home: °Eating and drinking ° °· Follow instructions from your doctor about diet. °· Eat small meals often. Avoid eating big meals. °· Eat foods that do not have a lot of fat in them. °· Drink enough fluid to keep your pee (urine) pale yellow. °· Do not drink alcohol if it caused your condition. °General instructions °· Take over-the-counter and prescription medicines only as told by your doctor. °· Do not use cigarettes, e-cigarettes, and chewing tobacco. If you need help quitting, ask your doctor. °· Get plenty of rest. °· If directed, check your blood sugar at home as told by your doctor. °· Keep all follow-up visits as told by your doctor. This is important. °Contact a doctor if: °· You do not get better as quickly as expected. °· You have new symptoms. °· Your symptoms get worse. °· You  have lasting pain or weakness. °· You continue to feel sick to your stomach. °· You get better and then you have another pain attack. °· You have a fever. °Get help right away if: °· You cannot eat or keep fluids down. °· Your pain becomes very bad. °· Your skin or the white part of your eyes turns yellow. °· You throw up. °· You feel dizzy or you pass out. °· Your blood sugar is high (over 300 mg/dL). °Summary °· Acute pancreatitis happens when the pancreas gets swollen. °· This condition is usually caused by alcohol abuse, drug abuse, or gallstones. °· You will probably have to stay in the hospital for treatment. °This information is not intended to replace advice given to you by your health care provider. Make sure you discuss any questions you have with your health care provider. °Document Released: 10/10/2007 Document Revised: 08/27/2016 Document Reviewed: 01/25/2015 °[employer]vier Interactive Patient Education © 2019 [employer] ° °

## 2018-05-24 NOTE — Progress Notes (Signed)
Nsg Discharge Note  Admit Date:  05/19/2018 Discharge date: 05/24/2018   [name] to be D/C'd Home per MD order.  AVS completed.  Copy for chart, and copy for patient signed, and dated. Patient/caregiver able to verbalize understanding.  Discharge Medication: Allergies as of 05/24/2018      Reactions   Hydrocodone-acetaminophen Palpitations   Shrimp [shellfish Allergy] Other (See Comments)   Cholesterol level becomes very high.      Medication List    TAKE these medications   acetaminophen 500 MG tablet Commonly known as:  TYLENOL Take 1,000 mg by mouth every 6 (six) hours as needed for mild pain or moderate pain.   amLODipine 10 MG tablet Commonly known as:  NORVASC TAKE 1 TABLET BY MOUTH EVERY DAY   carvedilol 12.5 MG tablet Commonly known as:  COREG TAKE 1 TABLET BY MOUTH TWICE DAILY A MEAL What changed:  See the new instructions.   chlorhexidine 0.12 % solution Commonly known as:  PERIDEX Use as directed 15 mLs in the mouth or throat 2 (two) times daily.   cyclobenzaprine 10 MG tablet Commonly known as:  FLEXERIL Take 1 tablet (10 mg total) by mouth at bedtime.   gabapentin 300 MG capsule Commonly known as:  NEURONTIN Take 2 capsules (600 mg total) by mouth 3 (three) times daily.   hydrOXYzine 25 MG tablet Commonly known as:  ATARAX/VISTARIL Take 1 tablet (25 mg total) by mouth at bedtime.   losartan-hydrochlorothiazide 100-25 MG tablet Commonly known as:  HYZAAR Take 1 tablet by mouth daily.   losartan-hydrochlorothiazide 100-25 MG tablet Commonly known as:  HYZAAR TAKE 1 TABLET BY MOUTH EVERY DAY   Melatonin 5 MG Tabs Take 1 tablet (5 mg total) by mouth daily. What changed:    when to take this  reasons to take this   multivitamin with minerals tablet Take 1 tablet by mouth daily.   naphazoline-pheniramine 0.025-0.3 % ophthalmic solution Commonly known as:  NAPHCON-A 1 drop 4 (four) times daily as needed for eye irritation.   omeprazole  20 MG capsule Commonly known as:  PRILOSEC TAKE 1 CAPSULE BY MOUTH DAILY.   polycarbophil 625 MG tablet Commonly known as:  FIBERCON Take 625 mg by mouth daily.   tetrahydrozoline 0.05 % ophthalmic solution Place 2 drops into both eyes as needed.       Discharge Assessment: Vitals:   05/24/18 0500 05/24/18 1346  BP: 136/86 114/83  Pulse: 63 84  Resp: 16 16  Temp: 99.1 F (37.3 C) 99.2 F (37.3 C)  SpO2: 93% 97%   Skin clean, dry and intact without evidence of skin break down, no evidence of skin tears noted. IV catheter discontinued intact. Site without signs and symptoms of complications - no redness or edema noted at insertion site, patient denies c/o pain - only slight tenderness at site.  Dressing with slight pressure applied.  D/c Instructions-Education: Discharge instructions given to patient/family with verbalized understanding. D/c education completed with patient/family including follow up instructions, medication list, d/c activities limitations if indicated, with other d/c instructions as indicated by MD - patient able to verbalize understanding, all questions fully answered. Patient instructed to return to ED, call 911, or call MD for any changes in condition.  Patient escorted via [place], and D/C home via private auto.  Martin Blue, RN 05/24/2018 2:21 PM  Nsg Discharge Note  Admit Date:  05/19/2018 Discharge date: 05/24/2018   [name] to be D/C'd Home per MD order.  AVS  completed.  Copy for chart, and copy for patient signed, and dated. Patient/caregiver able to verbalize understanding.  Discharge Medication: Allergies as of 05/24/2018      Reactions   Hydrocodone-acetaminophen Palpitations   Shrimp [shellfish Allergy] Other (See Comments)   Cholesterol level becomes very high.      Medication List    TAKE these medications   acetaminophen 500 MG tablet Commonly known as:  TYLENOL Take 1,000 mg by mouth every 6 (six) hours as needed for mild pain  or moderate pain.   amLODipine 10 MG tablet Commonly known as:  NORVASC TAKE 1 TABLET BY MOUTH EVERY DAY   carvedilol 12.5 MG tablet Commonly known as:  COREG TAKE 1 TABLET BY MOUTH TWICE DAILY A MEAL What changed:  See the new instructions.   chlorhexidine 0.12 % solution Commonly known as:  PERIDEX Use as directed 15 mLs in the mouth or throat 2 (two) times daily.   cyclobenzaprine 10 MG tablet Commonly known as:  FLEXERIL Take 1 tablet (10 mg total) by mouth at bedtime.   gabapentin 300 MG capsule Commonly known as:  NEURONTIN Take 2 capsules (600 mg total) by mouth 3 (three) times daily.   hydrOXYzine 25 MG tablet Commonly known as:  ATARAX/VISTARIL Take 1 tablet (25 mg total) by mouth at bedtime.   losartan-hydrochlorothiazide 100-25 MG tablet Commonly known as:  HYZAAR Take 1 tablet by mouth daily.   losartan-hydrochlorothiazide 100-25 MG tablet Commonly known as:  HYZAAR TAKE 1 TABLET BY MOUTH EVERY DAY   Melatonin 5 MG Tabs Take 1 tablet (5 mg total) by mouth daily. What changed:    when to take this  reasons to take this   multivitamin with minerals tablet Take 1 tablet by mouth daily.   naphazoline-pheniramine 0.025-0.3 % ophthalmic solution Commonly known as:  NAPHCON-A 1 drop 4 (four) times daily as needed for eye irritation.   omeprazole 20 MG capsule Commonly known as:  PRILOSEC TAKE 1 CAPSULE BY MOUTH DAILY.   polycarbophil 625 MG tablet Commonly known as:  FIBERCON Take 625 mg by mouth daily.   tetrahydrozoline 0.05 % ophthalmic solution Place 2 drops into both eyes as needed.       Discharge Assessment: Vitals:   05/24/18 0500 05/24/18 1346  BP: 136/86 114/83  Pulse: 63 84  Resp: 16 16  Temp: 99.1 F (37.3 C) 99.2 F (37.3 C)  SpO2: 93% 97%   Skin clean, dry and intact without evidence of skin break down, no evidence of skin tears noted. IV catheter discontinued intact. Site without signs and symptoms of complications - no  redness or edema noted at insertion site, patient denies c/o pain - only slight tenderness at site.  Dressing with slight pressure applied.  D/c Instructions-Education: Discharge instructions given to patient/family with verbalized understanding. D/c education completed with patient/family including follow up instructions, medication list, d/c activities limitations if indicated, with other d/c instructions as indicated by MD - patient able to verbalize understanding, all questions fully answered. Patient instructed to return to ED, call 911, or call MD for any changes in condition.  Patient escorted via [place], and D/C home via private auto.  Martin Blue, RN 05/24/2018 2:21 PM

## 2018-05-24 NOTE — Discharge Summary (Signed)
Physician Discharge Summary  Fowler Fowler UMP:[id number] DOB: Oct 12, 1960 DOA: 05/19/2018  PCP: Martin Demark, PA-C  Admit date: 05/19/2018 Discharge date: 05/24/2018  Admitted From: Home Disposition:  Home  Recommendations for Outpatient Follow-up:  1. Follow up with PCP in 1-2 weeks 2. Please obtain BMP/CBC in one week 3. Please follow up on the following pending results:  Home Health:No Equipment/Devices:No  Discharge Condition:stable CODE STATUS:Full Diet recommendation: Heart Healthy  Brief/Interim Summary: 58 y.o. male past medical history of alcohol abuse, multiple admissions for pancreatitis comes in for alcoholic pancreatitis. CT scan of the abdomen and pelvis showed inflamed pancreas body and tail with no air and fluid collection  Discharge Diagnoses:  Principal Problem:   Alcoholic pancreatitis Active Problems:   Alcohol abuse   NEPHROLITHIASIS, HX OF   Essential hypertension   Normal anion gap metabolic acidosis Alcoholic pancreatitis: He was treated conservatively with IV fluids and IV narcotics by the fourth day he was not requiring further IV narcotics we tried him on a diet which he tolerated he was discharged home. He was counseled about symptoms from alcohol he seems to understand.  Alcohol abuse: Counseling.  History of nephrolithiasis: CT scan of the abdomen and pelvis showed multiple nonobstructive stones, he will follow-up with urology as an outpatient.  Essential hypertension: Initially antihypertensive medications were held on admission resume during his hospital stay, he will continue with no changes at home.  Hypokalemia: Repleted orally now resolved.    Discharge Instructions  Discharge Instructions    Diet - low sodium heart healthy   Complete by:  As directed    Increase activity slowly   Complete by:  As directed      Allergies as of 05/24/2018      Reactions   Hydrocodone-acetaminophen Palpitations   Shrimp [shellfish  Allergy] Other (See Comments)   Cholesterol level becomes very high.      Medication List    TAKE these medications   acetaminophen 500 MG tablet Commonly known as:  TYLENOL Take 1,000 mg by mouth every 6 (six) hours as needed for mild pain or moderate pain.   amLODipine 10 MG tablet Commonly known as:  NORVASC TAKE 1 TABLET BY MOUTH EVERY DAY   carvedilol 12.5 MG tablet Commonly known as:  COREG TAKE 1 TABLET BY MOUTH TWICE DAILY A MEAL What changed:  See the new instructions.   chlorhexidine 0.12 % solution Commonly known as:  PERIDEX Use as directed 15 mLs in the mouth or throat 2 (two) times daily.   cyclobenzaprine 10 MG tablet Commonly known as:  FLEXERIL Take 1 tablet (10 mg total) by mouth at bedtime.   gabapentin 300 MG capsule Commonly known as:  NEURONTIN Take 2 capsules (600 mg total) by mouth 3 (three) times daily.   hydrOXYzine 25 MG tablet Commonly known as:  ATARAX/VISTARIL Take 1 tablet (25 mg total) by mouth at bedtime.   losartan-hydrochlorothiazide 100-25 MG tablet Commonly known as:  HYZAAR Take 1 tablet by mouth daily.   losartan-hydrochlorothiazide 100-25 MG tablet Commonly known as:  HYZAAR TAKE 1 TABLET BY MOUTH EVERY DAY   Melatonin 5 MG Tabs Take 1 tablet (5 mg total) by mouth daily. What changed:    when to take this  reasons to take this   multivitamin with minerals tablet Take 1 tablet by mouth daily.   naphazoline-pheniramine 0.025-0.3 % ophthalmic solution Commonly known as:  NAPHCON-A 1 drop 4 (four) times daily as needed for eye irritation.   omeprazole  20 MG capsule Commonly known as:  PRILOSEC TAKE 1 CAPSULE BY MOUTH DAILY.   polycarbophil 625 MG tablet Commonly known as:  FIBERCON Take 625 mg by mouth daily.   tetrahydrozoline 0.05 % ophthalmic solution Place 2 drops into both eyes as needed.      Follow-up Information    [facility]. Go on 06/12/2018.   Specialty:  Family  Medicine Why:  9:30 am Contact information: [place] [address] [phone]         Allergies  Allergen Reactions  . Hydrocodone-Acetaminophen Palpitations  . Shrimp [Shellfish Allergy] Other (See Comments)    Cholesterol level becomes very high.    Consultations:  None   Procedures/Studies: Ct Abdomen Pelvis W Contrast  Result Date: 05/19/2018 CLINICAL DATA:  58 year old male with right abdominal pain radiating to the flank since last night. Nausea vomiting. Abdominal distension. EXAM: CT ABDOMEN AND PELVIS WITH CONTRAST TECHNIQUE: Multidetector CT imaging of the abdomen and pelvis was performed using the standard protocol following bolus administration of intravenous contrast. CONTRAST:  139mL OMNIPAQUE IOHEXOL 300 MG/ML  SOLN COMPARISON:  CT Abdomen and Pelvis 10/06/2016 and earlier. FINDINGS: Lower chest: Negative. No pericardial or pleural effusion. Hepatobiliary: Hepatic steatosis. Negative gallbladder. Pancreas: Inflamed pancreatic body and tail (series 3, image 31) with peripancreatic inflammatory stranding. No pancreatic necrosis identified. No associated fluid collection. Spleen: Negative aside from pancreatic inflammation at the splenic hilum. Adrenals/Urinary Tract: Adrenal glands are within normal limits. Bilateral nephrolithiasis, largest stone is 4-5 millimeters in the right lower pole. Symmetric bilateral renal enhancement and contrast excretion. No acute perinephric stranding. Both ureters are within normal limits. Diminutive, unremarkable urinary bladder. Stomach/Bowel: Redundant sigmoid colon which tracks into the left upper quadrant and epigastrium. Mild to moderate sigmoid and descending colon diverticulosis. No associated inflammation. Decompressed transverse colon. Moderate diverticulosis at the hepatic flexure without active inflammation. The cecum is on a lax mesentery. Normal appendix (series 3, image 62). Negative terminal  ileum. No dilated small bowel. Stomach and duodenum remain within normal limits. No free air. No free fluid. Vascular/Lymphatic: Aortoiliac calcified atherosclerosis. Major arterial structures remain patent. The portal venous system is patent. No lymphadenopathy. Reproductive: Negative. Other: No pelvic free fluid. Musculoskeletal: Lower lumbar spine posterior fusion sequelae. No acute osseous abnormality identified. IMPRESSION: 1. Acute pancreatitis. Inflammation about the pancreatic body and tail with no fluid collection or complicating features. 2. Bilateral nephrolithiasis but no obstructive uropathy. 3. Fatty liver disease.  Large bowel diverticulosis. 4. Aortic Atherosclerosis (ICD10-I70.0). Electronically Signed   By: Genevie Ann M.D.   On: 05/19/2018 23:03     Subjective: Tolerating his diet no complaints.  Discharge Exam: Vitals:   05/24/18 0001 05/24/18 0500  BP: 135/81 136/86  Pulse: 63 63  Resp: 16 16  Temp: 99.5 F (37.5 C) 99.1 F (37.3 C)  SpO2: 100% 93%   Vitals:   05/23/18 1344 05/23/18 2048 05/24/18 0001 05/24/18 0500  BP: (!) 133/91 126/80 135/81 136/86  Pulse: 78 73 63 63  Resp: 16 16 16 16   Temp: 98.1 F (36.7 C) 98.6 F (37 C) 99.5 F (37.5 C) 99.1 F (37.3 C)  TempSrc: Oral Oral Oral Oral  SpO2: 100% 97% 100% 93%  Weight:      Height:        General: Pt is alert, awake, not in acute distress Cardiovascular: RRR, S1/S2 +, no rubs, no gallops Respiratory: CTA bilaterally, no wheezing, no rhonchi Abdominal: Soft, NT, ND, bowel sounds +  Extremities: no edema, no cyanosis    The results of significant diagnostics from this hospitalization (including imaging, microbiology, ancillary and laboratory) are listed below for reference.     Microbiology: No results found for this or any previous visit (from the past 240 hour(s)).   Labs: BNP (last 3 results) No results for input(s): BNP in the last 8760 hours. Basic Metabolic Panel: Recent Labs  Lab  05/20/18 0303 05/21/18 0346 05/21/18 0541 05/22/18 0500 05/23/18 0433 05/24/18 0520  NA 138 136  --  138 138 140  K 3.6 2.8*  --  3.2* 3.4* 3.2*  CL 105 101  --  104 104 104  CO2 19* 23  --  25 26 24   GLUCOSE 120* 121*  --  106* 119* 113*  BUN 20 7  --  5* 7 5*  CREATININE 1.23 1.10  --  1.16 1.17 1.20  CALCIUM 9.2 8.8*  --  8.8* 8.6* 8.7*  MG  --   --  1.2*  --   --   --    Liver Function Tests: Recent Labs  Lab 05/19/18 1800 05/20/18 0303 05/21/18 0346  AST 76* 45* 27  ALT 35 36 25  ALKPHOS 34* 32* 33*  BILITOT 1.7* 1.6* 1.5*  PROT RESULTS UNAVAILABLE DUE TO INTERFERING SUBSTANCE 7.5 6.6  ALBUMIN 4.6 4.5 3.7   Recent Labs  Lab 05/19/18 1800 05/21/18 0346  LIPASE 146* 80*   No results for input(s): AMMONIA in the last 168 hours. CBC: Recent Labs  Lab 05/19/18 1800 05/20/18 0303 05/21/18 0346  WBC 12.7* 16.3* 11.3*  HGB 13.1 12.6* 11.8*  HCT 37.1* 37.5* 34.8*  MCV 94.6 93.1 91.6  PLT 308 238 202   Cardiac Enzymes: No results for input(s): CKTOTAL, CKMB, CKMBINDEX, TROPONINI in the last 168 hours. BNP: Invalid input(s): POCBNP CBG: No results for input(s): GLUCAP in the last 168 hours. D-Dimer No results for input(s): DDIMER in the last 72 hours. Hgb A1c No results for input(s): HGBA1C in the last 72 hours. Lipid Profile No results for input(s): CHOL, HDL, LDLCALC, TRIG, CHOLHDL, LDLDIRECT in the last 72 hours. Thyroid function studies No results for input(s): TSH, T4TOTAL, T3FREE, THYROIDAB in the last 72 hours.  Invalid input(s): FREET3 Anemia work up No results for input(s): VITAMINB12, FOLATE, FERRITIN, TIBC, IRON, RETICCTPCT in the last 72 hours. Urinalysis    Component Value Date/Time   COLORURINE YELLOW 05/19/2018 1830   APPEARANCEUR HAZY (A) 05/19/2018 1830   LABSPEC 1.015 05/19/2018 1830   PHURINE 5.0 05/19/2018 1830   GLUCOSEU NEGATIVE 05/19/2018 1830   GLUCOSEU NEGATIVE 06/08/2010 1329   HGBUR MODERATE (A) 05/19/2018 1830    BILIRUBINUR NEGATIVE 05/19/2018 1830   KETONESUR NEGATIVE 05/19/2018 1830   PROTEINUR 30 (A) 05/19/2018 1830   UROBILINOGEN 0.2 05/27/2014 1552   NITRITE NEGATIVE 05/19/2018 1830   LEUKOCYTESUR NEGATIVE 05/19/2018 1830   Sepsis Labs Invalid input(s): PROCALCITONIN,  WBC,  LACTICIDVEN Microbiology No results found for this or any previous visit (from the past 240 hour(s)).   Time coordinating discharge: 40 minutes  SIGNED:   Charlynne Cousins, MD  [facility] 05/24/2018, 9:49 AM Pager   If 7PM-7AM, please contact night-coverage [email or web address] Password [email or web address]

## 2018-05-29 ENCOUNTER — Encounter (HOSPITAL_COMMUNITY): Payer: Self-pay

## 2018-05-29 ENCOUNTER — Ambulatory Visit (HOSPITAL_COMMUNITY)
Admission: EM | Admit: 2018-05-29 | Discharge: 2018-05-29 | Disposition: A | Discharge status: Home / Self Care | Payer: Self-pay | Attending: Family Medicine | Admitting: Family Medicine

## 2018-05-29 DIAGNOSIS — N200 Calculus of kidney: Secondary | ICD-10-CM | POA: Insufficient documentation

## 2018-05-29 LAB — CBC WITH DIFFERENTIAL/PLATELET
Abs Immature Granulocytes: 0.03 10*3/uL (ref 0.00–0.07)
Basophils Absolute: 0.1 10*3/uL (ref 0.0–0.1)
Basophils Relative: 1 %
Eosinophils Absolute: 0.1 10*3/uL (ref 0.0–0.5)
Eosinophils Relative: 1 %
HCT: 38.9 % — ABNORMAL LOW (ref 39.0–52.0)
Hemoglobin: 12.5 g/dL — ABNORMAL LOW (ref 13.0–17.0)
Immature Granulocytes: 0 %
Lymphocytes Relative: 29 %
Lymphs Abs: 2.9 10*3/uL (ref 0.7–4.0)
MCH: 30.4 pg (ref 26.0–34.0)
MCHC: 32.1 g/dL (ref 30.0–36.0)
MCV: 94.6 fL (ref 80.0–100.0)
MONO ABS: 1 10*3/uL (ref 0.1–1.0)
Monocytes Relative: 10 %
Neutro Abs: 5.8 10*3/uL (ref 1.7–7.7)
Neutrophils Relative %: 59 %
Platelets: 383 10*3/uL (ref 150–400)
RBC: 4.11 MIL/uL — AB (ref 4.22–5.81)
RDW: 12.3 % (ref 11.5–15.5)
WBC: 9.8 10*3/uL (ref 4.0–10.5)
nRBC: 0 % (ref 0.0–0.2)

## 2018-05-29 LAB — POCT URINALYSIS DIP (DEVICE)
GLUCOSE, UA: NEGATIVE mg/dL
Hgb urine dipstick: NEGATIVE
Leukocytes, UA: NEGATIVE
Nitrite: NEGATIVE
Protein, ur: 30 mg/dL — AB
Specific Gravity, Urine: 1.025 (ref 1.005–1.030)
Urobilinogen, UA: 1 mg/dL (ref 0.0–1.0)
pH: 5.5 (ref 5.0–8.0)

## 2018-05-29 LAB — BASIC METABOLIC PANEL
Anion gap: 12 (ref 5–15)
BUN: 10 mg/dL (ref 6–20)
CO2: 26 mmol/L (ref 22–32)
Calcium: 10.2 mg/dL (ref 8.9–10.3)
Chloride: 99 mmol/L (ref 98–111)
Creatinine, Ser: 1.69 mg/dL — ABNORMAL HIGH (ref 0.61–1.24)
GFR calc Af Amer: 51 mL/min — ABNORMAL LOW (ref >60)
GFR calc non Af Amer: 44 mL/min — ABNORMAL LOW (ref >60)
Glucose, Bld: 121 mg/dL — ABNORMAL HIGH (ref 70–99)
Potassium: 3.4 mmol/L — ABNORMAL LOW (ref 3.5–5.1)
Sodium: 137 mmol/L (ref 135–145)

## 2018-05-29 LAB — LIPASE, BLOOD: Lipase: 63 U/L — ABNORMAL HIGH (ref 11–51)

## 2018-05-29 MED ORDER — TAMSULOSIN HCL 0.4 MG PO CAPS: 0.4000 mg | ORAL_CAPSULE | Freq: Every day | ORAL | 0 refills | Status: DC

## 2018-05-29 MED ORDER — OXYCODONE-ACETAMINOPHEN 5-325 MG PO TABS: 2.0000 | ORAL_TABLET | ORAL | 0 refills | Status: DC | PRN

## 2018-05-29 NOTE — ED Provider Notes (Signed)
[place]    CSN: [id number] Arrival date & time: 05/29/18  1615     History   Chief Complaint Chief Complaint  Patient presents with  . Nephrolithiasis    HPI [name] is a 58 y.o. male.  Patient was hospitalized last week for pancreatitis.  He presents today with what he is calling kidney stone pain.  CT scan was reviewed from the hospital that showed bilateral nephrolithiasis.  He is due for follow-up CBC and BMP after hospital discharge.  Is also had some recent back surgery that he describes as disc disease.  He does complain of some difficulty with urination   HPI  Past Medical History:  Diagnosis Date  . Alcohol abuse   . Anxiety   . Arthritis   . Diverticulosis of colon (without mention of hemorrhage)   . GERD (gastroesophageal reflux disease)   . History of kidney stones   . Hyperlipemia   . Hypertension    dr Ronalee Belts  norins  . Kidney stones   . Nephrolithiasis   . Pancreatitis    Hx of   . Ureteral stent displacement [facility]) 1990    Patient Active Problem List   Diagnosis Date Noted  . Alcoholic pancreatitis 85/27/7824  . Normal anion gap metabolic acidosis 23/53/6144  . Radiculopathy 03/27/2018  . 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/02/2017  . Renal stone 10/08/2016  . Non-compliant behavior 12/02/2013  . Routine health maintenance 01/27/2012  . Hypertriglyceridemia 01/27/2012  . Knee pain, left 11/12/2011  . BPH (benign prostatic hyperplasia) 11/12/2011  . GERD 08/10/2008  . Essential hypertension 04/14/2008  . Alcohol abuse 10/26/2007  . TRIGGER FINGER, LEFT MIDDLE 05/12/2007  . NEPHROLITHIASIS, HX OF 05/12/2007    Past Surgical History:  Procedure Laterality Date  . A2 pulley flexor sheath cyst 4th finger excisional biopsy     '03  . ANTERIOR CRUCIATE LIGAMENT REPAIR     R knee  . ESOPHAGOGASTRODUODENOSCOPY N/A 07/09/2012   Procedure: ESOPHAGOGASTRODUODENOSCOPY (EGD);  Surgeon: Inda Castle, MD;  Location: [facility];   Service: Endoscopy;  Laterality: N/A;  . KNEE SURGERY  2008  . SHOULDER ARTHROSCOPY WITH SUBACROMIAL DECOMPRESSION AND OPEN ROTATOR C Right 12/26/2012   Procedure: RIGHT SHOULDER ARTHROSCOPY WITH SUBACROMIAL DECOMPRESSION MINI OPEN ROTATOR CUFF REPAIR, OPEN BICEP TENODESIS OPEN DISTAL CLAVICLE RESECTION  ;  Surgeon: Augustin Schooling, MD;  Location: [place];  Service: Orthopedics;  Laterality: Right;  . TRANSFORAMINAL LUMBAR INTERBODY FUSION (TLIF) WITH PEDICLE SCREW FIXATION 1 LEVEL Right 03/27/2018   Procedure: RIGHT-SIDED LUMBAR 4-5 TRANSFORAMINAL LUMBAR INTERBODY FUSION WITH INSTRUMENTATION AND ALLOGRAFT;  Surgeon: Phylliss Bob, MD;  Location: [place];  Service: Orthopedics;  Laterality: Right;  . URETEROSCOPY     and stone retreval       Home Medications    Prior to Admission medications   Medication Sig Start Date End Date Taking? Authorizing Provider  acetaminophen (TYLENOL) 500 MG tablet Take 1,000 mg by mouth every 6 (six) hours as needed for mild pain or moderate pain.    [provider]  amLODipine (NORVASC) 10 MG tablet TAKE 1 TABLET BY MOUTH EVERY DAY Patient taking differently: Take 10 mg by mouth daily.  01/13/18   Clent Demark, PA-C  carvedilol (COREG) 12.5 MG tablet TAKE 1 TABLET BY MOUTH TWICE DAILY A MEAL Patient taking differently: Take 12.5 mg by mouth 2 (two) times daily with a meal.  12/17/17   Clent Demark, PA-C  chlorhexidine (PERIDEX) 0.12 %  solution Use as directed 15 mLs in the mouth or throat 2 (two) times daily. Patient not taking: Reported on 05/19/2018 08/27/17   Clent Demark, PA-C  cyclobenzaprine (FLEXERIL) 10 MG tablet Take 1 tablet (10 mg total) by mouth at bedtime. Patient not taking: Reported on 05/19/2018 08/27/17   Clent Demark, PA-C  gabapentin (NEURONTIN) 300 MG capsule Take 2 capsules (600 mg total) by mouth 3 (three) times daily. Patient not taking: Reported on 05/19/2018 07/02/17   Clent Demark, PA-C  hydrOXYzine  (ATARAX/VISTARIL) 25 MG tablet Take 1 tablet (25 mg total) by mouth at bedtime. Patient not taking: Reported on 05/19/2018 04/24/17   Clent Demark, PA-C  losartan-hydrochlorothiazide [facility]) 100-25 MG tablet Take 1 tablet by mouth daily. Patient not taking: Reported on 05/19/2018 11/14/16   Clent Demark, PA-C  losartan-hydrochlorothiazide (HYZAAR) 100-25 MG tablet TAKE 1 TABLET BY MOUTH EVERY DAY Patient taking differently: Take 1 tablet by mouth daily.  01/13/18   Clent Demark, PA-C  Melatonin 5 MG TABS Take 1 tablet (5 mg total) by mouth daily. Patient taking differently: Take 1 tablet by mouth at bedtime as needed (sleep).  04/24/17   Clent Demark, PA-C  Multiple Vitamins-Minerals (MULTIVITAMIN WITH MINERALS) tablet Take 1 tablet by mouth daily.    [provider]  naphazoline-pheniramine (NAPHCON-A) 0.025-0.3 % ophthalmic solution 1 drop 4 (four) times daily as needed for eye irritation.    [provider]  omeprazole (PRILOSEC) 20 MG capsule TAKE 1 CAPSULE BY MOUTH DAILY. Patient taking differently: Take 20 mg by mouth daily.  09/16/17   Gildardo Pounds, NP  polycarbophil (FIBERCON) 625 MG tablet Take 625 mg by mouth daily.    [provider]  tetrahydrozoline 0.05 % ophthalmic solution Place 2 drops into both eyes as needed.    [provider]    Family History Family History  Problem Relation Age of Onset  . Hypertension Father        X4 siblings  . Heart disease Father   . Diabetes Mother        x2 brothers    Social History Social History   Tobacco Use  . Smoking status: Former Smoker    Types: Cigarettes    Last attempt to quit: 07/10/1998    Years since quitting: 19.8  . Smokeless tobacco: Never Used  Substance Use Topics  . Alcohol use: Yes    Alcohol/week: 6.0 standard drinks    Types: 6 Cans of beer per week    Comment: 1 6 pack a week  . Drug use: No     Allergies   Hydrocodone-acetaminophen and Shrimp  [shellfish allergy]   Review of Systems Review of Systems  Constitutional: Negative.   HENT: Negative.   Respiratory: Negative.   Cardiovascular: Negative.   Genitourinary: Positive for difficulty urinating, flank pain and hematuria.     Physical Exam Triage Vital Signs ED Triage Vitals [05/29/18 1639]  Enc Vitals Group     BP 111/80     Pulse Rate 77     Resp 16     Temp 98.8 F (37.1 C)     Temp Source Oral     SpO2 97 %     Weight      Height      Head Circumference      Peak Flow      Pain Score 8     Pain Loc      Pain Edu?  Excl. in [place]?    No data found.  Updated Vital Signs BP 111/80 (BP Location: Left Arm)   Pulse 77   Temp 98.8 F (37.1 C) (Oral)   Resp 16   SpO2 97%   Visual Acuity Right Eye Distance:   Left Eye Distance:   Bilateral Distance:    Right Eye Near:   Left Eye Near:    Bilateral Near:     Physical Exam Constitutional:      Appearance: Normal appearance. He is obese.  HENT:     Mouth/Throat:     Mouth: Mucous membranes are moist.     Pharynx: Oropharynx is clear.  Cardiovascular:     Rate and Rhythm: Normal rate and regular rhythm.     Heart sounds: Normal heart sounds.  Pulmonary:     Effort: Pulmonary effort is normal.     Breath sounds: Normal breath sounds.  Abdominal:     General: Bowel sounds are normal.     Palpations: Abdomen is soft.  Neurological:     Mental Status: He is alert.      UC Treatments / Results  Labs (all labs ordered are listed, but only abnormal results are displayed) Labs Reviewed  POCT URINALYSIS DIP (DEVICE) - Abnormal; Notable for the following components:      Result Value   Bilirubin Urine SMALL (*)    Ketones, ur TRACE (*)    Protein, ur 30 (*)    All other components within normal limits  BASIC METABOLIC PANEL  CBC WITH DIFFERENTIAL/PLATELET    EKG None  Radiology No results found.  Procedures Procedures (including critical care time)  Medications Ordered in  UC Medications - No data to display  Initial Impression / Assessment and Plan / UC Course  I have reviewed the triage vital signs and the nursing notes.  Pertinent labs & imaging results that were available during my care of the patient were reviewed by me and considered in my medical decision making (see chart for details).     Nephrolithiasis but clinical picture and diagnosis are inconsistent.  Patient is not restless at all and there is no blood in his urine.  CT scan from hospital 1 week ago showed bilateral disease.  We will try him on Flomax to help with urination Final Clinical Impressions(s) / UC Diagnoses   Final diagnoses:  None   Discharge Instructions   None    ED Prescriptions    None     Controlled Substance Prescriptions [name] Controlled Substance Registry consulted? Yes, I have consulted the [place] Controlled Substances Registry for this patient, and feel the risk/benefit ratio today is favorable for proceeding with this prescription for a controlled substance.   Wardell Honour, MD 05/29/18 1740

## 2018-05-29 NOTE — ED Triage Notes (Signed)
Pt C/o kidney stones, he is having a hard time using the bathroom.  Pt states he is having a lot of pressure on his right side lower quadrant area in his back.

## 2018-05-30 MED FILL — TAMSULOSIN HCL 0.4 MG CAP: 0.4 | 30 days supply | Qty: 30 | Fill #0

## 2018-06-04 ENCOUNTER — Telehealth (HOSPITAL_COMMUNITY): Payer: Self-pay | Admitting: Emergency Medicine

## 2018-06-04 NOTE — Telephone Encounter (Signed)
Contacted patient about his test results. Per Dr. Mannie Stabile, tests are improvement from previous labs at hospital visit. Spoke with patient about his results, states he is feeling better from his visit. All questions answered.

## 2018-06-10 ENCOUNTER — Other Ambulatory Visit: Payer: Self-pay | Admitting: Pharmacist

## 2018-06-10 MED FILL — ?OMEPRAZOLE 20 MG CAPSULE D: 20 | 30 days supply | Qty: 30 | Fill #7

## 2018-06-10 NOTE — Telephone Encounter (Signed)
Pt takes Hyzaar for BP control. Last seen by Francee Piccolo 08/27/17. Pt has a follow-up appointment with Dr. Margarita Rana at [facility] on 06/12/2018. Received refill request from [facility] pharmacy for Hyzaar.   Of note, pt's last BMP on 05/29/18 significant for K of 3.4 and Scr 1.69 (up 41% from 1.20 on 05/24/18). Will send to Dr. Margarita Rana for review.

## 2018-06-10 NOTE — Progress Notes (Signed)
Pt takes Hyzaar but is unavailable as combination pill.

## 2018-06-12 ENCOUNTER — Encounter (INDEPENDENT_AMBULATORY_CARE_PROVIDER_SITE_OTHER): Payer: Self-pay | Admitting: Family Medicine

## 2018-06-12 ENCOUNTER — Ambulatory Visit (INDEPENDENT_AMBULATORY_CARE_PROVIDER_SITE_OTHER): Payer: Self-pay | Admitting: Family Medicine

## 2018-06-12 ENCOUNTER — Other Ambulatory Visit: Payer: Self-pay

## 2018-06-12 VITALS — BP 144/96 | HR 75 | Temp 98.2°F | Ht 72.0 in | Wt 250.8 lb

## 2018-06-12 DIAGNOSIS — I10 Essential (primary) hypertension: Secondary | ICD-10-CM

## 2018-06-12 DIAGNOSIS — Z9889 Other specified postprocedural states: Secondary | ICD-10-CM

## 2018-06-12 DIAGNOSIS — N400 Enlarged prostate without lower urinary tract symptoms: Secondary | ICD-10-CM

## 2018-06-12 DIAGNOSIS — K219 Gastro-esophageal reflux disease without esophagitis: Secondary | ICD-10-CM

## 2018-06-12 MED ORDER — OMEPRAZOLE 20 MG PO CPDR: 20.0000 mg | DELAYED_RELEASE_CAPSULE | Freq: Every day | ORAL | 3 refills | Status: DC

## 2018-06-12 MED ORDER — CARVEDILOL 12.5 MG PO TABS: 12.5000 mg | ORAL_TABLET | Freq: Two times a day (BID) | ORAL | 1 refills | Status: DC

## 2018-06-12 MED ORDER — LOSARTAN POTASSIUM-HCTZ 100-25 MG PO TABS: 1.0000 | ORAL_TABLET | Freq: Every day | ORAL | 1 refills | Status: DC

## 2018-06-12 MED ORDER — AMLODIPINE BESYLATE 10 MG PO TABS: 10.0000 mg | ORAL_TABLET | Freq: Every day | ORAL | 1 refills | Status: DC

## 2018-06-12 MED ORDER — TAMSULOSIN HCL 0.4 MG PO CAPS: 0.4000 mg | ORAL_CAPSULE | Freq: Every day | ORAL | 1 refills | Status: DC

## 2018-06-12 MED FILL — AMLODIPINE BESYLATE 10 MG T: 10 | 30 days supply | Qty: 30 | Fill #0

## 2018-06-12 MED FILL — CARVEDILOL 12.5 MG TABLET: 12.5 | 30 days supply | Qty: 60 | Fill #0

## 2018-06-12 NOTE — Progress Notes (Signed)
Subjective:  Patient ID: [name], male    DOB: [birth year]  Age: 58 y.o. MRN: [id number]  CC: Hospitalization Follow-up (acute pancreatitis/ nephrolithiasis)   HPI [name] is a 58 year old male s/p right sided lumbar L4L5 transforaminal lumbar interbody fusion with allograft in 03/2018 who presets today for a follow up visit. He had a hospitalization for acute pancreatitis last month and reports resolution of abdominal pain. He also had an ED visit 2 weeks ago for nephrolithiasis, commenced on Flomax and he reports resolution of symptoms. He is compliant with his antihypertensives. His back pain is stable and he uses his analgesics as needed. Has an upcoming appointment with his surgeon at which time commencement of PT will be determined. Denies acute concerns today.   Past Medical History:  Diagnosis Date  . Alcohol abuse   . Anxiety   . Arthritis   . Diverticulosis of colon (without mention of hemorrhage)   . GERD (gastroesophageal reflux disease)   . History of kidney stones   . Hyperlipemia   . Hypertension    dr Ronalee Belts  norins  . Kidney stones   . Nephrolithiasis   . Pancreatitis    Hx of   . Ureteral stent displacement ([place]) 1990    Past Surgical History:  Procedure Laterality Date  . A2 pulley flexor sheath cyst 4th finger excisional biopsy     '03  . ANTERIOR CRUCIATE LIGAMENT REPAIR     R knee  . ESOPHAGOGASTRODUODENOSCOPY N/A 07/09/2012   Procedure: ESOPHAGOGASTRODUODENOSCOPY (EGD);  Surgeon: Inda Castle, MD;  [facility];  Service: Endoscopy;  Laterality: N/A;  . KNEE SURGERY  2008  . SHOULDER ARTHROSCOPY WITH SUBACROMIAL DECOMPRESSION AND OPEN ROTATOR C Right 12/26/2012   Procedure: RIGHT SHOULDER ARTHROSCOPY WITH SUBACROMIAL DECOMPRESSION MINI OPEN ROTATOR CUFF REPAIR, OPEN BICEP TENODESIS OPEN DISTAL CLAVICLE RESECTION  ;  Surgeon: Augustin Schooling, MD;  Location: [place];  Service: Orthopedics;  Laterality: Right;  . TRANSFORAMINAL LUMBAR  INTERBODY FUSION (TLIF) WITH PEDICLE SCREW FIXATION 1 LEVEL Right 03/27/2018   Procedure: RIGHT-SIDED LUMBAR 4-5 TRANSFORAMINAL LUMBAR INTERBODY FUSION WITH INSTRUMENTATION AND ALLOGRAFT;  Surgeon: Phylliss Bob, MD;  Location: [place];  Service: Orthopedics;  Laterality: Right;  . URETEROSCOPY     and stone retreval    Family History  Problem Relation Age of Onset  . Hypertension Father        X4 siblings  . Heart disease Father   . Diabetes Mother        x2 brothers    Allergies  Allergen Reactions  . Hydrocodone-Acetaminophen Palpitations  . Shrimp [Shellfish Allergy] Other (See Comments)    Cholesterol level becomes very high.    Outpatient Medications Prior to Visit  Medication Sig Dispense Refill  . acetaminophen (TYLENOL) 500 MG tablet Take 1,000 mg by mouth every 6 (six) hours as needed for mild pain or moderate pain.    . Multiple Vitamins-Minerals (MULTIVITAMIN WITH MINERALS) tablet Take 1 tablet by mouth daily.    [name] tetrahydrozoline 0.05 % ophthalmic solution Place 2 drops into both eyes as needed.    [name] amLODipine (NORVASC) 10 MG tablet TAKE 1 TABLET BY MOUTH EVERY DAY (Patient taking differently: Take 10 mg by mouth daily. ) 30 tablet 5  . carvedilol (COREG) 12.5 MG tablet TAKE 1 TABLET BY MOUTH TWICE DAILY A MEAL (Patient taking differently: Take 12.5 mg by mouth 2 (two) times daily with a meal. ) 180 tablet 4  .  omeprazole (PRILOSEC) 20 MG capsule TAKE 1 CAPSULE BY MOUTH DAILY. (Patient taking differently: Take 20 mg by mouth daily. ) 90 capsule 3  . tamsulosin (FLOMAX) 0.4 MG CAPS capsule Take 1 capsule (0.4 mg total) by mouth daily. 30 capsule 0  . naphazoline-pheniramine (NAPHCON-A) 0.025-0.3 % ophthalmic solution 1 drop 4 (four) times daily as needed for eye irritation.    . polycarbophil (FIBERCON) 625 MG tablet Take 625 mg by mouth daily.    . chlorhexidine (PERIDEX) 0.12 % solution Use as directed 15 mLs in the mouth or throat 2 (two) times daily. (Patient not  taking: Reported on 05/19/2018) 473 mL 0  . cyclobenzaprine (FLEXERIL) 10 MG tablet Take 1 tablet (10 mg total) by mouth at bedtime. (Patient not taking: Reported on 05/19/2018) 30 tablet 0  . gabapentin (NEURONTIN) 300 MG capsule Take 2 capsules (600 mg total) by mouth 3 (three) times daily. (Patient not taking: Reported on 05/19/2018) 180 capsule 5  . hydrOXYzine (ATARAX/VISTARIL) 25 MG tablet Take 1 tablet (25 mg total) by mouth at bedtime. (Patient not taking: Reported on 05/19/2018) 30 tablet 2  . Melatonin 5 MG TABS Take 1 tablet (5 mg total) by mouth daily. (Patient taking differently: Take 1 tablet by mouth at bedtime as needed (sleep). ) 30 tablet 2  . oxyCODONE-acetaminophen (PERCOCET/ROXICET) 5-325 MG tablet Take 2 tablets by mouth every 4 (four) hours as needed for severe pain. 15 tablet 0   No facility-administered medications prior to visit.      ROS Review of Systems General: negative for fever, weight loss, appetite change Eyes: no visual symptoms. ENT: no ear symptoms, no sinus tenderness, no nasal congestion or sore throat. Neck: no pain  Respiratory: no wheezing, shortness of breath, cough Cardiovascular: no chest pain, no dyspnea on exertion, no pedal edema, no orthopnea. Gastrointestinal: no abdominal pain, no diarrhea, no constipation Genito-Urinary: no urinary frequency, no dysuria, no polyuria. Hematologic: no bruising Endocrine: no cold or heat intolerance Neurological: no headaches, no seizures, no tremors Musculoskeletal: + back pain, no joint swelling Skin: no pruritus, no rash. Psychological: no depression, no anxiety,    Objective:  BP (!) 144/96 (BP Location: Left Arm, Patient Position: Sitting, Cuff Size: Normal)   Pulse 75   Temp 98.2 F (36.8 C) (Oral)   Ht 6' (1.829 m)   Wt 250 lb 12.8 oz (113.8 kg)   SpO2 96%   BMI 34.01 kg/m   BP/Weight 06/12/2018 05/29/2018 10/31/9483  Systolic BP 462 703 500  Diastolic BP 96 80 83  Wt. (Lbs) 250.8 - -  BMI  34.01 - -      Physical Exam Constitutional: normal appearing,  Eyes: PERRLA HEENT: Head is atraumatic, normal sinuses, normal oropharynx, normal appearing tonsils and palate, tympanic membrane is normal bilaterally. Neck: normal range of motion, no thyromegaly, no JVD Cardiovascular: normal rate and rhythm, normal heart sounds, no murmurs, rub or gallop, no pedal edema Respiratory: clear to auscultation bilaterally, no wheezes, no rales, no rhonchi Abdomen: soft, not tender to palpation, normal bowel sounds, no enlarged organs Extremities: Full ROM, no tenderness in joints Back-healed vertical surgical scar with slight induration.  Slight tenderness to palpation. Skin: warm and dry, no lesions. Neurological: alert, oriented x3, cranial nerves I-XII grossly intact , normal motor strength, normal sensation. Psychological: normal mood.   CMP Latest Ref Rng & Units 05/29/2018 05/24/2018 05/23/2018  Glucose 70 - 99 mg/dL 121(H) 113(H) 119(H)  BUN 6 - 20 mg/dL 10 5(L) 7  Creatinine 0.61 -  1.24 mg/dL 1.69(H) 1.20 1.17  Sodium 135 - 145 mmol/L 137 140 138  Potassium 3.5 - 5.1 mmol/L 3.4(L) 3.2(L) 3.4(L)  Chloride 98 - 111 mmol/L 99 104 104  CO2 22 - 32 mmol/L 26 24 26   Calcium 8.9 - 10.3 mg/dL 10.2 8.7(L) 8.6(L)  Total Protein 6.5 - 8.1 g/dL - - -  Total Bilirubin 0.3 - 1.2 mg/dL - - -  Alkaline Phos 38 - 126 U/L - - -  AST 15 - 41 U/L - - -  ALT 0 - 44 U/L - - -    Lipid Panel     Component Value Date/Time   CHOL 196 01/24/2012 1535   TRIG 302.0 (H) 01/24/2012 1535   HDL 57.00 01/24/2012 1535   CHOLHDL 3 01/24/2012 1535   VLDL 60.4 (H) 01/24/2012 1535   LDLDIRECT 107.5 01/24/2012 1535    CBC    Component Value Date/Time   WBC 9.8 05/29/2018 1700   RBC 4.11 (L) 05/29/2018 1700   HGB 12.5 (L) 05/29/2018 1700   HGB 12.6 (L) 07/10/2016 1502   HCT 38.9 (L) 05/29/2018 1700   HCT 36.7 (L) 07/10/2016 1502   PLT 383 05/29/2018 1700   PLT 258 07/10/2016 1502   MCV 94.6  05/29/2018 1700   MCV 93 07/10/2016 1502   MCH 30.4 05/29/2018 1700   MCHC 32.1 05/29/2018 1700   RDW 12.3 05/29/2018 1700   RDW 13.2 07/10/2016 1502   LYMPHSABS 2.9 05/29/2018 1700   LYMPHSABS 3.5 (H) 07/10/2016 1502   MONOABS 1.0 05/29/2018 1700   EOSABS 0.1 05/29/2018 1700   EOSABS 0.1 07/10/2016 1502   BASOSABS 0.1 05/29/2018 1700   BASOSABS 0.1 07/10/2016 1502    Lab Results  Component Value Date   HGBA1C 5.4 03/02/2014    Assessment & Plan:   1. Hypertension, unspecified type Slightly elevated No regimen change today Counseled on blood pressure goal of less than 130/80, low-sodium, DASH diet, medication compliance, 150 minutes of moderate intensity exercise per week. Discussed medication compliance, adverse effects. - amLODipine (NORVASC) 10 MG tablet; Take 1 tablet (10 mg total) by mouth daily.  Dispense: 90 tablet; Refill: 1 - carvedilol (COREG) 12.5 MG tablet; Take 1 tablet (12.5 mg total) by mouth 2 (two) times daily with a meal.  Dispense: 180 tablet; Refill: 1 - losartan-hydrochlorothiazide (HYZAAR) 100-25 MG tablet; Take 1 tablet by mouth daily.  Dispense: 90 tablet; Refill: 1 - Basic Metabolic Panel  2. History of back surgery Sees surgeon tomorrow and at which time commencing rehab will be discussed  3. Benign prostatic hyperplasia without lower urinary tract symptoms Controlled - tamsulosin (FLOMAX) 0.4 MG CAPS capsule; Take 1 capsule (0.4 mg total) by mouth daily.  Dispense: 90 capsule; Refill: 1  4. Gastroesophageal reflux disease without esophagitis Controlled - omeprazole (PRILOSEC) 20 MG capsule; Take 1 capsule (20 mg total) by mouth daily.  Dispense: 90 capsule; Refill: 3   Meds ordered this encounter  Medications  . amLODipine (NORVASC) 10 MG tablet    Sig: Take 1 tablet (10 mg total) by mouth daily.    Dispense:  90 tablet    Refill:  1  . omeprazole (PRILOSEC) 20 MG capsule    Sig: Take 1 capsule (20 mg total) by mouth daily.    Dispense:   90 capsule    Refill:  3  . carvedilol (COREG) 12.5 MG tablet    Sig: Take 1 tablet (12.5 mg total) by mouth 2 (two) times daily with a  meal.    Dispense:  180 tablet    Refill:  1  . tamsulosin (FLOMAX) 0.4 MG CAPS capsule    Sig: Take 1 capsule (0.4 mg total) by mouth daily.    Dispense:  90 capsule    Refill:  1  . losartan-hydrochlorothiazide (HYZAAR) 100-25 MG tablet    Sig: Take 1 tablet by mouth daily.    Dispense:  90 tablet    Refill:  1    Follow-up: Return in about 3 months (around 09/10/2018) for follow up of chronic medical conditions.   Charlott Rakes MD    Charlott Rakes, MD, FAAFP. [facility] and [facility] [place], [place]   06/12/2018, 10:04 AM

## 2018-06-12 NOTE — Patient Instructions (Signed)

## 2018-06-13 ENCOUNTER — Other Ambulatory Visit: Payer: Self-pay | Admitting: Family Medicine

## 2018-06-13 ENCOUNTER — Encounter (INDEPENDENT_AMBULATORY_CARE_PROVIDER_SITE_OTHER): Payer: Self-pay | Admitting: Family Medicine

## 2018-06-13 LAB — BASIC METABOLIC PANEL
BUN/Creatinine Ratio: 12 (ref 9–20)
BUN: 14 mg/dL (ref 6–24)
CO2: 23 mmol/L (ref 20–29)
Calcium: 10.1 mg/dL (ref 8.7–10.2)
Chloride: 103 mmol/L (ref 96–106)
Creatinine, Ser: 1.14 mg/dL (ref 0.76–1.27)
GFR calc Af Amer: 83 mL/min/{1.73_m2} (ref >59)
GFR calc non Af Amer: 71 mL/min/{1.73_m2} (ref >59)
Glucose: 122 mg/dL — ABNORMAL HIGH (ref 65–99)
Potassium: 3.7 mmol/L (ref 3.5–5.2)
Sodium: 142 mmol/L (ref 134–144)

## 2018-06-13 MED ORDER — LOSARTAN POTASSIUM 100 MG PO TABS: 100.0000 mg | ORAL_TABLET | Freq: Every day | ORAL | 1 refills | Status: DC

## 2018-06-13 MED ORDER — HYDROCHLOROTHIAZIDE 25 MG PO TABS: 25.0000 mg | ORAL_TABLET | Freq: Every day | ORAL | 1 refills | Status: DC

## 2018-06-13 MED FILL — LOSARTAN POTASSIUM 100 MG T: 100 | 30 days supply | Qty: 30 | Fill #0

## 2018-06-18 ENCOUNTER — Telehealth (INDEPENDENT_AMBULATORY_CARE_PROVIDER_SITE_OTHER): Payer: Self-pay | Admitting: Physician Assistant

## 2018-06-18 NOTE — Telephone Encounter (Signed)
Patient called to check on the status of their lab results. Please follow up.

## 2018-06-18 NOTE — Telephone Encounter (Signed)
Patient is aware that results were sent to his Mychart. He was verbally informed that all labs are stable. Penny Arrambide Latrelle Dodrill

## 2018-06-20 ENCOUNTER — Ambulatory Visit: Payer: Self-pay | Attending: Family Medicine

## 2018-06-25 ENCOUNTER — Ambulatory Visit: Payer: Self-pay

## 2018-07-08 MED FILL — ?OMEPRAZOLE 20 MG CAPSULE D: 20 | 30 days supply | Qty: 30 | Fill #8

## 2018-07-08 MED FILL — LOSARTAN POTASSIUM 100 MG T: 100 | 30 days supply | Qty: 30 | Fill #1

## 2018-07-22 MED FILL — AMLODIPINE BESYLATE 10 MG T: 10 | 30 days supply | Qty: 30 | Fill #1

## 2018-08-05 MED FILL — ?CARVEDILOL 12.5 MG TABLET: 12.5 | 90 days supply | Qty: 180 | Fill #1

## 2018-08-05 MED FILL — ?OMEPRAZOLE 20 MG CAPSULE D: 20 | 90 days supply | Qty: 90 | Fill #9

## 2018-08-11 MED FILL — AMLODIPINE BESYLATE 10 MG T: 10 | 30 days supply | Qty: 30 | Fill #2

## 2018-08-11 MED FILL — LOSARTAN POTASSIUM 100 MG T: 100 | 30 days supply | Qty: 30 | Fill #2

## 2018-09-05 MED FILL — LOSARTAN POTASSIUM 100 MG T: 100 | 30 days supply | Qty: 30 | Fill #3

## 2018-09-06 MED FILL — ?AMLODIPINE BESYLATE 10 MG: 10 | 30 days supply | Qty: 30 | Fill #3

## 2018-09-11 ENCOUNTER — Ambulatory Visit: Payer: Self-pay | Admitting: Primary Care

## 2018-09-15 ENCOUNTER — Ambulatory Visit: Payer: Self-pay | Attending: Primary Care | Admitting: Primary Care

## 2018-09-15 ENCOUNTER — Other Ambulatory Visit: Payer: Self-pay

## 2018-09-15 ENCOUNTER — Encounter: Payer: Self-pay | Admitting: *Deleted

## 2018-09-15 DIAGNOSIS — E876 Hypokalemia: Secondary | ICD-10-CM

## 2018-09-15 DIAGNOSIS — R350 Frequency of micturition: Secondary | ICD-10-CM

## 2018-09-15 DIAGNOSIS — Z0000 Encounter for general adult medical examination without abnormal findings: Secondary | ICD-10-CM

## 2018-09-15 DIAGNOSIS — K219 Gastro-esophageal reflux disease without esophagitis: Secondary | ICD-10-CM

## 2018-09-15 DIAGNOSIS — M5410 Radiculopathy, site unspecified: Secondary | ICD-10-CM

## 2018-09-15 NOTE — Progress Notes (Signed)
Med refill and chronic medical condition

## 2018-09-15 NOTE — Progress Notes (Signed)
Virtual Visit via Telephone Note  I connected with [name] on 09/15/18 at  3:40 PM EDT by telephone and verified that I am speaking with the correct person using two identifiers.   I discussed the limitations, risks, security and privacy concerns of performing an evaluation and management service by telephone and the availability of in person appointments. I also discussed with the patient that there may be a patient responsible charge related to this service. The patient expressed understanding and agreed to proceed.   History of Present Illness: [name] is being seen today via tele meds  For medication refills. He is s/p right sided lumbar L4L5 transforaminal lumbar interbody fusion with allograft 6 month ago he is still in PT and states doing well. He has a PMH :Diverticulosis of colon (without mention of hemorrhage)History of kidney stones- feels like one is present now), HTN, and arthritis.   Observations/Objective: Review of Systems  Constitutional: Negative.   HENT: Negative.   Eyes: Negative.   Respiratory: Positive for cough.   Cardiovascular: Negative.   Gastrointestinal: Positive for heartburn.  Genitourinary: Negative.   Musculoskeletal: Negative.   Skin: Negative.     Assessment and Plan: [name] was seen today for medication refill and chronic medical condition.  Diagnoses and all orders for this visit:  Gastroesophageal Reflux  He complains of belching, coughing and heartburn. This is a recurrent problem. The current episode started in the past 7 days. The problem occurs frequently. The problem has been unchanged. The heartburn duration is several minutes. The heartburn is located in the substernum and abdomen. The heartburn is of moderate intensity. The heartburn wakes him from sleep. The heartburn does not limit his activity. The heartburn changes with position. The symptoms are aggravated by lying down.    Radiculopathy, unspecified spinal region  back surgery  from [facility] presently still in PT feels better   Routine health maintenance Medication Refill  This is a recurrent problem. The current episode started more than 1 month ago. The problem occurs 2 to 4 times per day. The problem has been gradually worsening. Associated symptoms include coughing. The symptoms are aggravated by swallowing and eating. He has tried nothing for the symptoms. The treatment provided mild relief.    Hypokalemia Patient to come in for labs befor refill K+  Follow Up Instructions:    I discussed the assessment and treatment plan with the patient. The patient was provided an opportunity to ask questions and all were answered. The patient agreed with the plan and demonstrated an understanding of the instructions.   The patient was advised to call back or seek an in-person evaluation if the symptoms worsen or if the condition fails to improve as anticipated.  I provided 20 minutes of non-face-to-face time during this encounter.   [name], NP

## 2018-09-16 ENCOUNTER — Other Ambulatory Visit: Payer: Self-pay

## 2018-09-16 DIAGNOSIS — R350 Frequency of micturition: Secondary | ICD-10-CM

## 2018-09-16 NOTE — Addendum Note (Signed)
Addended by: Jacqualine Mau on: 09/16/2018 04:19 PM   Modules accepted: Orders

## 2018-09-17 LAB — COMPREHENSIVE METABOLIC PANEL
ALT: 16 IU/L (ref 0–44)
AST: 16 IU/L (ref 0–40)
Albumin/Globulin Ratio: 1.6 (ref 1.2–2.2)
Albumin: 4.3 g/dL (ref 3.8–4.9)
Alkaline Phosphatase: 46 IU/L (ref 39–117)
BUN/Creatinine Ratio: 14 (ref 9–20)
BUN: 15 mg/dL (ref 6–24)
Bilirubin Total: 0.4 mg/dL (ref 0.0–1.2)
CO2: 24 mmol/L (ref 20–29)
Calcium: 9.3 mg/dL (ref 8.7–10.2)
Chloride: 104 mmol/L (ref 96–106)
Creatinine, Ser: 1.07 mg/dL (ref 0.76–1.27)
GFR calc Af Amer: 89 mL/min/{1.73_m2} (ref >59)
GFR calc non Af Amer: 77 mL/min/{1.73_m2} (ref >59)
Globulin, Total: 2.7 g/dL (ref 1.5–4.5)
Glucose: 94 mg/dL (ref 65–99)
Potassium: 4 mmol/L (ref 3.5–5.2)
Sodium: 144 mmol/L (ref 134–144)
Total Protein: 7 g/dL (ref 6.0–8.5)

## 2018-09-17 LAB — HIV ANTIBODY (ROUTINE TESTING W REFLEX): HIV Screen 4th Generation wRfx: NONREACTIVE

## 2018-09-20 LAB — URINE CYTOLOGY ANCILLARY ONLY: Candida vaginitis: NEGATIVE

## 2018-10-07 MED FILL — LOSARTAN POTASSIUM 100 MG T: 100 | 30 days supply | Qty: 30 | Fill #4

## 2018-10-07 MED FILL — ?AMLODIPINE BESYLATE 10 MG: 10 | 30 days supply | Qty: 30 | Fill #4

## 2018-10-29 MED FILL — ?CARVEDILOL 12.5MG TABLET: 12.5 | 30 days supply | Qty: 60 | Fill #2

## 2018-10-29 MED FILL — ?OMEPRAZOLE 20 MG CAPSULE D: 20 | 30 days supply | Qty: 30 | Fill #0

## 2018-10-31 MED FILL — ?AMLODIPINE BESYLATE 10 MG: 10 | 30 days supply | Qty: 30 | Fill #5

## 2018-10-31 MED FILL — LOSARTAN POTASSIUM 100 MG T: 100 | 30 days supply | Qty: 30 | Fill #5

## 2018-11-01 ENCOUNTER — Ambulatory Visit (HOSPITAL_COMMUNITY)
Admission: EM | Admit: 2018-11-01 | Discharge: 2018-11-01 | Disposition: A | Discharge status: Home / Self Care | Payer: Self-pay | Attending: Family Medicine | Admitting: Family Medicine

## 2018-11-01 ENCOUNTER — Encounter (HOSPITAL_COMMUNITY): Payer: Self-pay | Admitting: Emergency Medicine

## 2018-11-01 ENCOUNTER — Other Ambulatory Visit: Payer: Self-pay

## 2018-11-01 DIAGNOSIS — M545 Low back pain: Secondary | ICD-10-CM

## 2018-11-01 DIAGNOSIS — G8929 Other chronic pain: Secondary | ICD-10-CM

## 2018-11-01 MED ORDER — METHYLPREDNISOLONE SODIUM SUCC 125 MG IJ SOLR
INTRAMUSCULAR | Status: AC
  Filled 2018-11-01: qty 2

## 2018-11-01 MED ORDER — PREDNISONE 20 MG PO TABS: ORAL_TABLET | ORAL | 0 refills | Status: DC

## 2018-11-01 MED ORDER — KETOROLAC TROMETHAMINE 60 MG/2ML IM SOLN
INTRAMUSCULAR | Status: AC
  Filled 2018-11-01: qty 2

## 2018-11-01 MED ORDER — KETOROLAC TROMETHAMINE 60 MG/2ML IM SOLN
60.0000 mg | Freq: Once | INTRAMUSCULAR | Status: AC
  Administered 2018-11-01: 18:00:00 60 mg via INTRAMUSCULAR

## 2018-11-01 MED ORDER — TRAMADOL HCL 50 MG PO TABS: 50.0000 mg | ORAL_TABLET | Freq: Four times a day (QID) | ORAL | 0 refills | Status: DC | PRN

## 2018-11-01 MED ORDER — METHYLPREDNISOLONE SODIUM SUCC 125 MG IJ SOLR
125.0000 mg | Freq: Once | INTRAMUSCULAR | Status: AC
  Administered 2018-11-01: 125 mg via INTRAMUSCULAR

## 2018-11-01 NOTE — Discharge Instructions (Addendum)
Start prednisone tomorrow. Prednisone 20 mg,  in mornings with breakfast as follows:  Take 3 pills for 3 days, Take 2 pills for 3 days, and Take 1 pill for 3 days.  Complete all medication.  Pain will trial a short course of tramadol take as directed. Continue with home muscle relaxer. Follow-up with primary care provider and or specialist if symptoms worsen or do not improve with prescribed regimen. You received a Toradol and Solu-Medrol injection today to reduce inflammation.

## 2018-11-01 NOTE — ED Provider Notes (Signed)
[facility]    CSN: [id number] Arrival date & time: 11/01/18  1557      History   Chief Complaint Chief Complaint  Patient presents with  . Back Pain    HPI [name] is a 58 y.o. male.   HPI  Patient with a history chronic low back pain, presents today with a complaint of 3 days of bilateral lower back pain. Denies injury.  He takes cyclobenzaprine and methocarbamol for back pain although medication has not been effective in relieving current pain. He is also taken Tylenol without significant relief of back pain.  He reports pain and difficulty ambulating and changing positions.  He denies any symptoms significant for caudae equina.   Past Medical History:  Diagnosis Date  . Alcohol abuse   . Anxiety   . Arthritis   . Diverticulosis of colon (without mention of hemorrhage)   . GERD (gastroesophageal reflux disease)   . History of kidney stones   . Hyperlipemia   . Hypertension    dr Ronalee Belts  norins  . Kidney stones   . Nephrolithiasis   . Pancreatitis    Hx of   . 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 Alcoholic pancreatitis 54/00/8676  . Normal anion gap metabolic acidosis 19/50/9326  . Radiculopathy 03/27/2018  . 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/02/2017  . Renal stone 10/08/2016  . Non-compliant behavior 12/02/2013  . Routine health maintenance 01/27/2012  . Hypertriglyceridemia 01/27/2012  . Knee pain, left 11/12/2011  . BPH (benign prostatic hyperplasia) 11/12/2011  . GERD 08/10/2008  . Essential hypertension 04/14/2008  . Alcohol abuse 10/26/2007  . TRIGGER FINGER, LEFT MIDDLE 05/12/2007  . NEPHROLITHIASIS, HX OF 05/12/2007    Past Surgical History:  Procedure Laterality Date  . A2 pulley flexor sheath cyst 4th finger excisional biopsy     '03  . ANTERIOR CRUCIATE LIGAMENT REPAIR     R knee  . ESOPHAGOGASTRODUODENOSCOPY N/A 07/09/2012   Procedure:  ESOPHAGOGASTRODUODENOSCOPY (EGD);  Surgeon: Inda Castle, MD;  Location: [facility];  Service: Endoscopy;  Laterality: N/A;  . KNEE SURGERY  2008  . SHOULDER ARTHROSCOPY WITH SUBACROMIAL DECOMPRESSION AND OPEN ROTATOR C Right 12/26/2012   Procedure: RIGHT SHOULDER ARTHROSCOPY WITH SUBACROMIAL DECOMPRESSION MINI OPEN ROTATOR CUFF REPAIR, OPEN BICEP TENODESIS OPEN DISTAL CLAVICLE RESECTION  ;  Surgeon: Augustin Schooling, MD;  Location: [place];  Service: Orthopedics;  Laterality: Right;  . TRANSFORAMINAL LUMBAR INTERBODY FUSION (TLIF) WITH PEDICLE SCREW FIXATION 1 LEVEL Right 03/27/2018   Procedure: RIGHT-SIDED LUMBAR 4-5 TRANSFORAMINAL LUMBAR INTERBODY FUSION WITH INSTRUMENTATION AND ALLOGRAFT;  Surgeon: Phylliss Bob, MD;  Location: [place];  Service: Orthopedics;  Laterality: Right;  . URETEROSCOPY     and stone retreval    Home Medications    Prior to Admission medications   Medication Sig Start Date End Date Taking? Authorizing Provider  acetaminophen (TYLENOL) 500 MG tablet Take 1,000 mg by mouth every 6 (six) hours as needed for mild pain or moderate pain.    [provider]  amLODipine (NORVASC) 10 MG tablet Take 1 tablet (10 mg total) by mouth daily. 06/12/18   Charlott Rakes, MD  carvedilol (COREG) 12.5 MG tablet Take 1 tablet (12.5 mg total) by mouth 2 (two) times daily with a meal. 06/12/18   Charlott Rakes, MD  hydrochlorothiazide (HYDRODIURIL) 25 MG tablet Take 1 tablet (25 mg total) by mouth daily. 06/13/18   Newlin,  Enobong, MD  losartan (COZAAR) 100 MG tablet Take 1 tablet (100 mg total) by mouth daily. 06/13/18   Charlott Rakes, MD  Multiple Vitamins-Minerals (MULTIVITAMIN WITH MINERALS) tablet Take 1 tablet by mouth daily.    [provider]  naphazoline-pheniramine (NAPHCON-A) 0.025-0.3 % ophthalmic solution 1 drop 4 (four) times daily as needed for eye irritation.    [provider]  omeprazole (PRILOSEC) 20 MG capsule Take 1 capsule (20 mg total) by mouth  daily. 06/12/18   Charlott Rakes, MD  polycarbophil (FIBERCON) 625 MG tablet Take 625 mg by mouth daily.    [provider]  POTASSIUM CITRATE PO Take by mouth.    [provider]  tamsulosin (FLOMAX) 0.4 MG CAPS capsule Take 1 capsule (0.4 mg total) by mouth daily. 06/12/18   Charlott Rakes, MD  tetrahydrozoline 0.05 % ophthalmic solution Place 2 drops into both eyes as needed.    [provider]    Family History Family History  Problem Relation Age of Onset  . Hypertension Father        X4 siblings  . Heart disease Father   . Diabetes Mother        x2 brothers    Social History Social History   Tobacco Use  . Smoking status: Former Smoker    Types: Cigarettes    Quit date: 07/10/1998    Years since quitting: 20.3  . Smokeless tobacco: Never Used  Substance Use Topics  . Alcohol use: Yes    Alcohol/week: 6.0 standard drinks    Types: 6 Cans of beer per week    Comment: 1 6 pack a week  . Drug use: No     Allergies   Hydrocodone-acetaminophen and Shrimp [shellfish allergy]   Review of Systems Review of Systems Pertinent negatives listed in HPI Physical Exam Triage Vital Signs ED Triage Vitals  Enc Vitals Group     BP 11/01/18 1619 (!) 122/91     Pulse Rate 11/01/18 1619 90     Resp 11/01/18 1619 18     Temp 11/01/18 1619 98.6 F (37 C)     Temp Source 11/01/18 1619 Oral     SpO2 11/01/18 1619 96 %     Weight --      Height --      Head Circumference --      Peak Flow --      Pain Score 11/01/18 1620 10     Pain Loc --      Pain Edu? --      Excl. in [place]? --    No data found.  Updated Vital Signs BP (!) 122/91 (BP Location: Left Arm)   Pulse 90   Temp 98.6 F (37 C) (Oral)   Resp 18   SpO2 96%   Visual Acuity Right Eye Distance:   Left Eye Distance:   Bilateral Distance:    Right Eye Near:   Left Eye Near:    Bilateral Near:     Physical Exam Constitutional:      Appearance: Normal appearance.  Cardiovascular:      Rate and Rhythm: Normal rate.  Musculoskeletal:     Lumbar back: He exhibits decreased range of motion and tenderness.  Neurological:     Mental Status: He is alert.     Gait: Gait abnormal.     Comments: Antalgic gait    UC Treatments / Results  Labs (all labs ordered are listed, but only abnormal results are displayed) Labs  Reviewed - No data to display  EKG None  Radiology No results found.  Procedures Procedures (including critical care time)  Medications Ordered in UC Medications - No data to display  Initial Impression / Assessment and Plan / UC Course  I have reviewed the triage vital signs and the nursing notes.  Pertinent labs & imaging results that were available during my care of the patient were reviewed by me and considered in my medical decision making (see chart for details).   Patient presents with bilateral lower lumbar back pain and has a history of chronic back pain. Ordered Toradol and Solumedrol to reduce inflammation. Tomorrow start prednisone taper. Continue home muscle relaxants. Prescribed brief course of tramadol for acute pain. Recommended follow-up with orthopedic specialist. Final Clinical Impressions(s) / UC Diagnoses   Final diagnoses:  Chronic bilateral low back pain without sciatica     Discharge Instructions     Start prednisone tomorrow. Prednisone 20 mg,  in mornings with breakfast as follows:  Take 3 pills for 3 days, Take 2 pills for 3 days, and Take 1 pill for 3 days.  Complete all medication.  Pain will trial a short course of tramadol take as directed. Continue with home muscle relaxer. Follow-up with primary care provider and or specialist if symptoms worsen or do not improve with prescribed regimen. You received a Toradol and Solu-Medrol injection today to reduce inflammation.    ED Prescriptions    Medication Sig Dispense Auth. Provider   predniSONE (DELTASONE) 20 MG tablet Take 3 PO QAM x3days, 2 PO QAM x3days, 1 PO  QAM x3days 18 tablet Scot Jun, FNP   traMADol (ULTRAM) 50 MG tablet Take 1-2 tablets (50-100 mg total) by mouth every 6 (six) hours as needed. 16 tablet Scot Jun, FNP     Controlled Substance Prescriptions [place] Controlled Substance Registry consulted? Yes, I have consulted the [place] Controlled Substances Registry for this patient, and feel the risk/benefit ratio today is favorable for proceeding with this prescription for a controlled substance.   Scot Jun, FNP 11/01/18 [id number]

## 2018-11-01 NOTE — ED Triage Notes (Signed)
Pt here with chronic back pain worse than normal x 3 days

## 2018-11-25 ENCOUNTER — Ambulatory Visit (HOSPITAL_COMMUNITY)
Admission: EM | Admit: 2018-11-25 | Discharge: 2018-11-25 | Disposition: A | Discharge status: Home / Self Care | Payer: Self-pay | Attending: Family Medicine | Admitting: Family Medicine

## 2018-11-25 ENCOUNTER — Other Ambulatory Visit: Payer: Self-pay

## 2018-11-25 ENCOUNTER — Encounter (HOSPITAL_COMMUNITY): Payer: Self-pay

## 2018-11-25 DIAGNOSIS — S6010XA Contusion of unspecified finger with damage to nail, initial encounter: Secondary | ICD-10-CM

## 2018-11-25 DIAGNOSIS — W231XXA Caught, crushed, jammed, or pinched between stationary objects, initial encounter: Secondary | ICD-10-CM

## 2018-11-25 DIAGNOSIS — I10 Essential (primary) hypertension: Secondary | ICD-10-CM

## 2018-11-25 DIAGNOSIS — S60111A Contusion of right thumb with damage to nail, initial encounter: Secondary | ICD-10-CM

## 2018-11-25 MED ORDER — CEPHALEXIN 500 MG PO CAPS: 500.0000 mg | ORAL_CAPSULE | Freq: Four times a day (QID) | ORAL | 0 refills | Status: DC

## 2018-11-25 NOTE — Discharge Instructions (Signed)
Soak in warm soapy water 2 times a day Apply your antifungal treatment to the base Take antibiotic for 5 days Expect improvement gradually over time

## 2018-11-25 NOTE — ED Provider Notes (Signed)
[place]    CSN: [id number] Arrival date & time: 11/25/18  [place]      History   Chief Complaint Chief Complaint  Patient presents with  . Appointment  . Finger Injury    HPI [name] is a 58 y.o. male.   HPI  Patient smashed his thumb nail a couple months ago.  He had a large subungual hematoma.  Abscess is growing out the distal nail is lifting up.  About three quarters of the nail has purple discoloration and is detached.  There is only a small amount attached at the nail base.  Couple days ago he caught the nail on something and it lifted up and blood.  Now he has redness and pain at the edges.  He thinks he has a nail infection.  Past Medical History:  Diagnosis Date  . Alcohol abuse   . Anxiety   . Arthritis   . Diverticulosis of colon (without mention of hemorrhage)   . GERD (gastroesophageal reflux disease)   . History of kidney stones   . Hyperlipemia   . Hypertension    dr Ronalee Belts  norins  . Kidney stones   . Nephrolithiasis   . Pancreatitis    Hx of   . 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 Alcoholic pancreatitis 57/32/2025  . Normal anion gap metabolic acidosis 42/70/6237  . Radiculopathy 03/27/2018  . 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/02/2017  . Renal stone 10/08/2016  . Non-compliant behavior 12/02/2013  . Routine health maintenance 01/27/2012  . Hypertriglyceridemia 01/27/2012  . Knee pain, left 11/12/2011  . BPH (benign prostatic hyperplasia) 11/12/2011  . GERD 08/10/2008  . Essential hypertension 04/14/2008  . Alcohol abuse 10/26/2007  . TRIGGER FINGER, LEFT MIDDLE 05/12/2007  . NEPHROLITHIASIS, HX OF 05/12/2007    Past Surgical History:  Procedure Laterality Date  . A2 pulley flexor sheath cyst 4th finger excisional biopsy     '03  . ANTERIOR CRUCIATE LIGAMENT REPAIR     R knee  . ESOPHAGOGASTRODUODENOSCOPY N/A 07/09/2012   Procedure:  ESOPHAGOGASTRODUODENOSCOPY (EGD);  Surgeon: Inda Castle, MD;  Location: [facility];  Service: Endoscopy;  Laterality: N/A;  . KNEE SURGERY  2008  . SHOULDER ARTHROSCOPY WITH SUBACROMIAL DECOMPRESSION AND OPEN ROTATOR C Right 12/26/2012   Procedure: RIGHT SHOULDER ARTHROSCOPY WITH SUBACROMIAL DECOMPRESSION MINI OPEN ROTATOR CUFF REPAIR, OPEN BICEP TENODESIS OPEN DISTAL CLAVICLE RESECTION  ;  Surgeon: Augustin Schooling, MD;  Location: [place];  Service: Orthopedics;  Laterality: Right;  . TRANSFORAMINAL LUMBAR INTERBODY FUSION (TLIF) WITH PEDICLE SCREW FIXATION 1 LEVEL Right 03/27/2018   Procedure: RIGHT-SIDED LUMBAR 4-5 TRANSFORAMINAL LUMBAR INTERBODY FUSION WITH INSTRUMENTATION AND ALLOGRAFT;  Surgeon: Phylliss Bob, MD;  Location: [place];  Service: Orthopedics;  Laterality: Right;  . URETEROSCOPY     and stone retreval       Home Medications    Prior to Admission medications   Medication Sig Start Date End Date Taking? Authorizing Provider  acetaminophen (TYLENOL) 500 MG tablet Take 1,000 mg by mouth every 6 (six) hours as needed for mild pain or moderate pain.    [provider]  amLODipine (NORVASC) 10 MG tablet Take 1 tablet (10 mg total) by mouth daily. 06/12/18   Charlott Rakes, MD  carvedilol (COREG) 12.5 MG tablet Take 1 tablet (12.5 mg total) by mouth 2 (two) times daily with a meal. 06/12/18   Charlott Rakes, MD  cephALEXin (KEFLEX) 500 MG capsule Take 1 capsule (500 mg total) by mouth 4 (four) times daily. 11/25/18   Raylene Everts, MD  hydrochlorothiazide (HYDRODIURIL) 25 MG tablet Take 1 tablet (25 mg total) by mouth daily. 06/13/18   Charlott Rakes, MD  losartan (COZAAR) 100 MG tablet Take 1 tablet (100 mg total) by mouth daily. 06/13/18   Charlott Rakes, MD  Multiple Vitamins-Minerals (MULTIVITAMIN WITH MINERALS) tablet Take 1 tablet by mouth daily.    [provider]  omeprazole (PRILOSEC) 20 MG capsule Take 1 capsule (20 mg total) by mouth daily. 06/12/18    Charlott Rakes, MD  POTASSIUM CITRATE PO Take by mouth.    [provider]    Family History Family History  Problem Relation Age of Onset  . Hypertension Father        X4 siblings  . Heart disease Father   . Diabetes Mother        x2 brothers    Social History Social History   Tobacco Use  . Smoking status: Former Smoker    Types: Cigarettes    Quit date: 07/10/1998    Years since quitting: 20.3  . Smokeless tobacco: Never Used  Substance Use Topics  . Alcohol use: Yes    Alcohol/week: 6.0 standard drinks    Types: 6 Cans of beer per week    Comment: 1 6 pack a week  . Drug use: No     Allergies   Hydrocodone-acetaminophen and Shrimp [shellfish allergy]   Review of Systems Review of Systems  Constitutional: Negative for chills and fever.  HENT: Negative for ear pain and sore throat.   Eyes: Negative for pain and visual disturbance.  Respiratory: Negative for cough and shortness of breath.   Cardiovascular: Negative for chest pain and palpitations.  Gastrointestinal: Negative for abdominal pain and vomiting.  Genitourinary: Negative for dysuria and hematuria.  Musculoskeletal: Negative for arthralgias and back pain.  Skin: Positive for wound. Negative for color change and rash.  Neurological: Negative for seizures and syncope.  All other systems reviewed and are negative.    Physical Exam Triage Vital Signs ED Triage Vitals  Enc Vitals Group     BP 11/25/18 1858 (!) 133/96     Pulse Rate 11/25/18 1858 96     Resp 11/25/18 1858 16     Temp 11/25/18 1858 98.6 F (37 C)     Temp Source 11/25/18 1858 Oral     SpO2 11/25/18 1858 99 %     Weight --      Height --      Head Circumference --      Peak Flow --      Pain Score 11/25/18 1855 3     Pain Loc --      Pain Edu? --      Excl. in [place]? --    No data found.  Updated Vital Signs BP (!) 133/96 (BP Location: Left Arm)   Pulse 96   Temp 98.6 F (37 C) (Oral)   Resp 16   SpO2 99%      Physical Exam Constitutional:      General: He is not in acute distress.    Appearance: He is well-developed. He is obese.  HENT:     Head: Normocephalic and atraumatic.  Eyes:     Conjunctiva/sclera: Conjunctivae normal.     Pupils: Pupils are equal, round, and reactive to light.  Neck:     Musculoskeletal: Normal range of  motion.  Cardiovascular:     Rate and Rhythm: Normal rate.  Pulmonary:     Effort: Pulmonary effort is normal. No respiratory distress.  Abdominal:     General: There is no distension.     Palpations: Abdomen is soft.  Musculoskeletal: Normal range of motion.  Skin:    General: Skin is warm and dry.     Comments: Thumbnail the right hand subungual hematoma under the distal two thirds of the nail.  It is lifting up.  There is redness of the surrounding skin.  I soak the nail for 15 minutes in soapy water.  Trimmed away the excess nail.  The nail bed looks irregular.  He may have fungus in this area.  If there is difficulty with the nail growing out he should consult with his PCP.  Neurological:     Mental Status: He is alert.      UC Treatments / Results  Labs (all labs ordered are listed, but only abnormal results are displayed) Labs Reviewed - No data to display  EKG   Radiology No results found.  Procedures Procedures (including critical care time)  Medications Ordered in UC Medications - No data to display  Initial Impression / Assessment and Plan / UC Course  I have reviewed the triage vital signs and the nursing notes.  Pertinent labs & imaging results that were available during my care of the patient were reviewed by me and considered in my medical decision making (see chart for details).      Final Clinical Impressions(s) / UC Diagnoses   Final diagnoses:  Subungual hematoma of digit of hand, initial encounter     Discharge Instructions     Soak in warm soapy water 2 times a day Apply your antifungal treatment to the base  Take antibiotic for 5 days Expect improvement gradually over time   ED Prescriptions    Medication Sig Dispense Auth. Provider   cephALEXin (KEFLEX) 500 MG capsule Take 1 capsule (500 mg total) by mouth 4 (four) times daily. 20 capsule Raylene Everts, MD     Controlled Substance Prescriptions [place] Controlled Substance Registry consulted? Not Applicable   Raylene Everts, MD 11/25/18 [id number]

## 2018-11-25 NOTE — ED Triage Notes (Signed)
Patient presents to Urgent Care with complaints of right thumb discoloration (black) since jamming it while rolling up the window in his truck. Patient reports the fingernail is barely attached, pt is able to lift it.

## 2018-12-04 ENCOUNTER — Other Ambulatory Visit (INDEPENDENT_AMBULATORY_CARE_PROVIDER_SITE_OTHER): Payer: Self-pay | Admitting: Family Medicine

## 2018-12-04 ENCOUNTER — Other Ambulatory Visit: Payer: Self-pay | Admitting: Family Medicine

## 2018-12-04 DIAGNOSIS — I10 Essential (primary) hypertension: Secondary | ICD-10-CM

## 2018-12-04 MED FILL — LOSARTAN POTASSIUM 100 MG T: 100 | 30 days supply | Qty: 30 | Fill #0

## 2018-12-04 MED FILL — ?OMEPRAZOLE 20 MG CAPSULE D: 20 | 30 days supply | Qty: 30 | Fill #1

## 2018-12-04 MED FILL — ?CARVEDILOL 12.5 MG TABLET: 12.5 | 30 days supply | Qty: 60 | Fill #3

## 2018-12-04 MED FILL — ?AMLODIPINE BESYLATE 10 MG: 10 | 30 days supply | Qty: 30 | Fill #5

## 2019-01-13 ENCOUNTER — Other Ambulatory Visit: Payer: Self-pay | Admitting: Family Medicine

## 2019-01-13 MED FILL — ?CARVEDILOL 12.5 MG TABLET: 12.5 | 30 days supply | Qty: 60 | Fill #2

## 2019-01-13 MED FILL — LOSARTAN POTASSIUM 100 MG T: 100 | 30 days supply | Qty: 30 | Fill #0

## 2019-01-13 MED FILL — ?AMLODIPINE BESYLATE 10 MG: 10 | 30 days supply | Qty: 30 | Fill #0

## 2019-01-13 MED FILL — ?OMEPRAZOLE 20 MG CAPSULE D: 20 | 30 days supply | Qty: 30 | Fill #2

## 2019-01-22 ENCOUNTER — Telehealth (INDEPENDENT_AMBULATORY_CARE_PROVIDER_SITE_OTHER): Payer: Self-pay | Admitting: Primary Care

## 2019-01-28 ENCOUNTER — Ambulatory Visit (INDEPENDENT_AMBULATORY_CARE_PROVIDER_SITE_OTHER): Payer: Self-pay

## 2019-01-28 ENCOUNTER — Ambulatory Visit
Admission: EM | Admit: 2019-01-28 | Discharge: 2019-01-28 | Disposition: A | Discharge status: Home / Self Care | Payer: Self-pay | Attending: Physician Assistant | Admitting: Physician Assistant

## 2019-01-28 DIAGNOSIS — M25531 Pain in right wrist: Secondary | ICD-10-CM

## 2019-01-28 DIAGNOSIS — M7021 Olecranon bursitis, right elbow: Secondary | ICD-10-CM

## 2019-01-28 DIAGNOSIS — W19XXXA Unspecified fall, initial encounter: Secondary | ICD-10-CM

## 2019-01-28 MED ORDER — MELOXICAM 7.5 MG PO TABS: 7.5000 mg | ORAL_TABLET | Freq: Every day | ORAL | 0 refills | Status: DC

## 2019-01-28 NOTE — ED Provider Notes (Signed)
[facility]    CSN: [id number] Arrival date & time: 01/28/19  1452      History   Chief Complaint Chief Complaint  Patient presents with  . Wrist Pain    HPI [name] is a 58 y.o. male.   58 yo male present with 12 day hx of right wrist and elbow pain following a fall. He caught himself with his right hand dorsiflex against the refrigerator and then fell and landed with him right elbow against the tile floor. He reports the pain in his elbow is much better. Now only occurs with certain movement. He has swelling to his right elbow now that has gotten larger. Right wrist with pain to the radial aspect that is worsening, though with improvement in swelling. He reports decreased grip strength of his right hand and feels like his "thumb is going to give out". Denies fever or spreading erythema. Denies numbness/tingling. Has tried ice/heat compress, tylenol.      Past Medical History:  Diagnosis Date  . Alcohol abuse   . Anxiety   . Arthritis   . Diverticulosis of colon (without mention of hemorrhage)   . GERD (gastroesophageal reflux disease)   . History of kidney stones   . Hyperlipemia   . Hypertension    dr Ronalee Belts  norins  . Kidney stones   . Nephrolithiasis   . Pancreatitis    Hx of   . Ureteral stent displacement [facility]) 1990    Patient Active Problem List   Diagnosis Date Noted  . Alcoholic pancreatitis 123456  . Normal anion gap metabolic acidosis 123456  . Radiculopathy 03/27/2018  . 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/02/2017  . Renal stone 10/08/2016  . Non-compliant behavior 12/02/2013  . Routine health maintenance 01/27/2012  . Hypertriglyceridemia 01/27/2012  . Knee pain, left 11/12/2011  . BPH (benign prostatic hyperplasia) 11/12/2011  . GERD 08/10/2008  . Essential hypertension 04/14/2008  . Alcohol abuse 10/26/2007  . TRIGGER FINGER, LEFT MIDDLE 05/12/2007  . NEPHROLITHIASIS, HX OF 05/12/2007    Past Surgical History:   Procedure Laterality Date  . A2 pulley flexor sheath cyst 4th finger excisional biopsy     '03  . ANTERIOR CRUCIATE LIGAMENT REPAIR     R knee  . ESOPHAGOGASTRODUODENOSCOPY N/A 07/09/2012   Procedure: ESOPHAGOGASTRODUODENOSCOPY (EGD);  Surgeon: Inda Castle, MD;  Location: [facility];  Service: Endoscopy;  Laterality: N/A;  . KNEE SURGERY  2008  . SHOULDER ARTHROSCOPY WITH SUBACROMIAL DECOMPRESSION AND OPEN ROTATOR C Right 12/26/2012   Procedure: RIGHT SHOULDER ARTHROSCOPY WITH SUBACROMIAL DECOMPRESSION MINI OPEN ROTATOR CUFF REPAIR, OPEN BICEP TENODESIS OPEN DISTAL CLAVICLE RESECTION  ;  Surgeon: Augustin Schooling, MD;  Location: [place];  Service: Orthopedics;  Laterality: Right;  . TRANSFORAMINAL LUMBAR INTERBODY FUSION (TLIF) WITH PEDICLE SCREW FIXATION 1 LEVEL Right 03/27/2018   Procedure: RIGHT-SIDED LUMBAR 4-5 TRANSFORAMINAL LUMBAR INTERBODY FUSION WITH INSTRUMENTATION AND ALLOGRAFT;  Surgeon: Phylliss Bob, MD;  Location: [place];  Service: Orthopedics;  Laterality: Right;  . URETEROSCOPY     and stone retreval       Home Medications    Prior to Admission medications   Medication Sig Start Date End Date Taking? Authorizing Provider  acetaminophen (TYLENOL) 500 MG tablet Take 1,000 mg by mouth every 6 (six) hours as needed for mild pain or moderate pain.    [provider]  amLODipine (NORVASC) 10 MG tablet Take 1 tablet (10 mg total) by mouth daily. MUST MAKE APPT FOR  FURTHER REFILLS 12/04/18   Charlott Rakes, MD  carvedilol (COREG) 12.5 MG tablet Take 1 tablet (12.5 mg total) by mouth 2 (two) times daily with a meal. 06/12/18   Newlin, Enobong, MD  losartan (COZAAR) 100 MG tablet TAKE 1 TABLET (100 MG TOTAL) BY MOUTH DAILY. 01/13/19   Charlott Rakes, MD  meloxicam (MOBIC) 7.5 MG tablet Take 1 tablet (7.5 mg total) by mouth daily. 01/28/19   Tasia Catchings, Lovina Zuver V, PA-C  Multiple Vitamins-Minerals (MULTIVITAMIN WITH MINERALS) tablet Take 1 tablet by mouth daily.    [provider]   omeprazole (PRILOSEC) 20 MG capsule Take 1 capsule (20 mg total) by mouth daily. 06/12/18   Charlott Rakes, MD  POTASSIUM CITRATE PO Take by mouth.    [provider]    Family History Family History  Problem Relation Age of Onset  . Hypertension Father        X4 siblings  . Heart disease Father   . Diabetes Mother        x2 brothers    Social History Social History   Tobacco Use  . Smoking status: Former Smoker    Types: Cigarettes    Quit date: 07/10/1998    Years since quitting: 20.5  . Smokeless tobacco: Never Used  Substance Use Topics  . Alcohol use: Yes    Alcohol/week: 6.0 standard drinks    Types: 6 Cans of beer per week    Comment: 1 6 pack a week  . Drug use: No     Allergies   Hydrocodone-acetaminophen and Shrimp [shellfish allergy]   Review of Systems Review of Systems  See HPI as above.    Physical Exam Triage Vital Signs ED Triage Vitals  Enc Vitals Group     BP 01/28/19 1501 (!) 123/101     Pulse Rate 01/28/19 1501 92     Resp 01/28/19 1501 18     Temp 01/28/19 1501 99 F (37.2 C)     Temp Source 01/28/19 1501 Oral     SpO2 01/28/19 1501 98 %     Weight --      Height --      Head Circumference --      Peak Flow --      Pain Score 01/28/19 1502 6     Pain Loc --      Pain Edu? --      Excl. in [place]? --    No data found.  Updated Vital Signs BP (!) 123/101 (BP Location: Left Arm)   Pulse 92   Temp 99 F (37.2 C) (Oral)   Resp 18   SpO2 98%   Physical Exam Constitutional:      General: He is not in acute distress.    Appearance: Normal appearance. He is not ill-appearing, toxic-appearing or diaphoretic.  HENT:     Head: Normocephalic and atraumatic.  Pulmonary:     Effort: Pulmonary effort is normal. No respiratory distress.  Musculoskeletal:     Comments: Right elbow: Soft, round fluid pocket at olecranon. No erythema or warmth. Focal tenderness to palpation above and below fluid pocket. Full ROM of elbow.   Right  wrist: Mild swelling. No erythema or warmth. Focal tenderness to radial wrist. Tenderness to palpation along 1st MCP. Full ROM and strength of wrist. Decreased strength of thumb. Sensation intact. Radial pulse 2+.   Skin:    General: Skin is warm and dry.     Capillary Refill: Capillary refill takes less than 2  seconds.  Neurological:     Mental Status: He is alert and oriented to person, place, and time. Mental status is at baseline.  Psychiatric:        Mood and Affect: Mood normal.        Behavior: Behavior normal.     UC Treatments / Results  Labs (all labs ordered are listed, but only abnormal results are displayed) Labs Reviewed - No data to display  EKG   Radiology Dg Elbow Complete Right  Result Date: 01/28/2019 CLINICAL DATA:  Right elbow pain and swelling.  Recent fall. EXAM: RIGHT ELBOW - COMPLETE 3+ VIEW COMPARISON:  None. FINDINGS: Right elbow is located without a fracture or large joint effusion. Spurring involving the olecranon. There is focal soft tissue swelling in the region of the olecranon bursa. Evidence for incomplete ossification or loose body involving the medial epicondyle. Spurring at the coronoid process of the ulna. IMPRESSION: 1. No acute bone abnormality to the right elbow. 2. Focal soft tissue swelling at the posterior elbow. Findings could be related to olecranon bursitis. Electronically Signed   By: Markus Daft M.D.   On: 01/28/2019 16:01   Dg Wrist Complete Right  Result Date: 01/28/2019 CLINICAL DATA:  Injury, swelling EXAM: RIGHT WRIST - COMPLETE 3+ VIEW COMPARISON:  None. FINDINGS: No fracture or dislocation of the right wrist. The carpus is normally aligned. Mild radiocarpal arthrosis. Moderate arthrosis of the thumb basal joints. Soft tissue edema over the dorsum of the wrist. IMPRESSION: 1. No fracture or dislocation of the right wrist. The carpus is normally aligned. 2. Mild radiocarpal arthrosis. Moderate arthrosis of the thumb basal joints. 3.   Soft tissue edema over the dorsum of the wrist. Electronically Signed   By: Eddie Candle M.D.   On: 01/28/2019 15:59    Procedures Procedures (including critical care time)  Medications Ordered in UC Medications - No data to display  Initial Impression / Assessment and Plan / UC Course  I have reviewed the triage vital signs and the nursing notes.  Pertinent labs & imaging results that were available during my care of the patient were reviewed by me and considered in my medical decision making (see chart for details).    Xray negative for fracture or dislocation. Xray and exam for elbow consistent with right olecranon bursitis, likely due to fall. No erythema/warmth, low suspicion for infectious causes. NSAIDs, ice compress, rest, thumb spica during activity. Patient to follow up with sports medicine if symptoms not improving. Return precautions given.  Final Clinical Impressions(s) / UC Diagnoses   Final diagnoses:  Right wrist pain  Olecranon bursitis of right elbow    ED Prescriptions    Medication Sig Dispense Auth. Provider   meloxicam (MOBIC) 7.5 MG tablet Take 1 tablet (7.5 mg total) by mouth daily. 15 tablet Ok Edwards, PA-C     PDMP not reviewed this encounter.   Ok Edwards, PA-C 01/28/19 [phone]

## 2019-01-28 NOTE — ED Triage Notes (Signed)
Pt states fell 12 days ago catching his fall with his rt hand. C/o pain to rt hand/wrist and swelling to rt elbow.

## 2019-01-28 NOTE — Discharge Instructions (Addendum)
Xray negative for fracture or dislocation. Start Mobic. Do not take ibuprofen (motrin/advil)/ naproxen (aleve) while on mobic. Ice compress, rest, wrist splint during activity. Follow up with sports medicine if symptoms not improving.

## 2019-02-10 ENCOUNTER — Other Ambulatory Visit: Payer: Self-pay | Admitting: Family Medicine

## 2019-02-10 DIAGNOSIS — I10 Essential (primary) hypertension: Secondary | ICD-10-CM

## 2019-02-10 MED FILL — CARVEDILOL 12.5 MG TABLET: 12.5 | 30 days supply | Qty: 60 | Fill #3

## 2019-02-10 MED FILL — OMEPRAZOLE 20 MG CAP: 20 | 30 days supply | Qty: 30 | Fill #3

## 2019-03-11 ENCOUNTER — Other Ambulatory Visit (INDEPENDENT_AMBULATORY_CARE_PROVIDER_SITE_OTHER): Payer: Self-pay | Admitting: Family Medicine

## 2019-03-11 DIAGNOSIS — I10 Essential (primary) hypertension: Secondary | ICD-10-CM

## 2019-03-11 MED FILL — ?OMEPRAZOLE 20MG CAP DR: 20 | 30 days supply | Qty: 30 | Fill #4

## 2019-03-19 ENCOUNTER — Encounter (INDEPENDENT_AMBULATORY_CARE_PROVIDER_SITE_OTHER): Payer: Self-pay | Admitting: Primary Care

## 2019-03-19 ENCOUNTER — Ambulatory Visit (INDEPENDENT_AMBULATORY_CARE_PROVIDER_SITE_OTHER): Payer: Self-pay | Admitting: Primary Care

## 2019-03-19 ENCOUNTER — Other Ambulatory Visit: Payer: Self-pay

## 2019-03-19 VITALS — BP 129/87 | HR 102 | Temp 97.3°F | Ht 72.0 in | Wt 249.8 lb

## 2019-03-19 DIAGNOSIS — E669 Obesity, unspecified: Secondary | ICD-10-CM

## 2019-03-19 DIAGNOSIS — I10 Essential (primary) hypertension: Secondary | ICD-10-CM

## 2019-03-19 DIAGNOSIS — Z760 Encounter for issue of repeat prescription: Secondary | ICD-10-CM

## 2019-03-19 DIAGNOSIS — Z6833 Body mass index (BMI) 33.0-33.9, adult: Secondary | ICD-10-CM

## 2019-03-19 DIAGNOSIS — M7021 Olecranon bursitis, right elbow: Secondary | ICD-10-CM

## 2019-03-19 MED ORDER — AMLODIPINE BESYLATE 10 MG PO TABS: 10.0000 mg | ORAL_TABLET | Freq: Every day | ORAL | 1 refills | Status: DC

## 2019-03-19 MED ORDER — LOSARTAN POTASSIUM 100 MG PO TABS: 100.0000 mg | ORAL_TABLET | Freq: Every day | ORAL | 1 refills | Status: DC

## 2019-03-19 MED ORDER — CARVEDILOL 12.5 MG PO TABS: 12.5000 mg | ORAL_TABLET | Freq: Two times a day (BID) | ORAL | 1 refills | Status: DC

## 2019-03-19 MED FILL — LOSARTAN POTASSIUM 100 MG T: 100 | 30 days supply | Qty: 30 | Fill #0

## 2019-03-19 MED FILL — ?AMLODIPINE BESYLATE 10 MG: 10 | 30 days supply | Qty: 30 | Fill #0

## 2019-03-19 MED FILL — ?CARVEDILOL 12.5 MG TABLET: 12.5 | 30 days supply | Qty: 60 | Fill #0

## 2019-03-19 NOTE — Progress Notes (Signed)
Established Patient Office Visit  Subjective:  Patient ID: [name], male    DOB: [birth year]  Age: 58 y.o. MRN: [id number]  CC: No chief complaint on file.   HPI [name] presents for medication refills last seen was May 2020. He is out of several of his blood pressure medications and requesting refills. He denies shortness of breath, headaches, chest pain or lower extremity edema.   Past Medical History:  Diagnosis Date  . Alcohol abuse   . Anxiety   . Arthritis   . Diverticulosis of colon (without mention of hemorrhage)   . GERD (gastroesophageal reflux disease)   . History of kidney stones   . Hyperlipemia   . Hypertension    dr Ronalee Belts  norins  . Kidney stones   . Nephrolithiasis   . Pancreatitis    Hx of   . Ureteral stent displacement ([place]) 1990    Past Surgical History:  Procedure Laterality Date  . A2 pulley flexor sheath cyst 4th finger excisional biopsy     '03  . ANTERIOR CRUCIATE LIGAMENT REPAIR     R knee  . ESOPHAGOGASTRODUODENOSCOPY N/A 07/09/2012   Procedure: ESOPHAGOGASTRODUODENOSCOPY (EGD);  Surgeon: Inda Castle, MD;  Location: [facility];  Service: Endoscopy;  Laterality: N/A;  . KNEE SURGERY  2008  . SHOULDER ARTHROSCOPY WITH SUBACROMIAL DECOMPRESSION AND OPEN ROTATOR C Right 12/26/2012   Procedure: RIGHT SHOULDER ARTHROSCOPY WITH SUBACROMIAL DECOMPRESSION MINI OPEN ROTATOR CUFF REPAIR, OPEN BICEP TENODESIS OPEN DISTAL CLAVICLE RESECTION  ;  Surgeon: Augustin Schooling, MD;  Location: [place];  Service: Orthopedics;  Laterality: Right;  . TRANSFORAMINAL LUMBAR INTERBODY FUSION (TLIF) WITH PEDICLE SCREW FIXATION 1 LEVEL Right 03/27/2018   Procedure: RIGHT-SIDED LUMBAR 4-5 TRANSFORAMINAL LUMBAR INTERBODY FUSION WITH INSTRUMENTATION AND ALLOGRAFT;  Surgeon: Phylliss Bob, MD;  Location: [place];  Service: Orthopedics;  Laterality: Right;  . URETEROSCOPY     and stone retreval    Family History  Problem Relation Age of Onset  . Hypertension Father         X4 siblings  . Heart disease Father   . Diabetes Mother        x2 brothers    Social History   Socioeconomic History  . Marital status: Married    Spouse name: Not on file  . Number of children: 3  . Years of education: Not on file  . Highest education level: Not on file  Occupational History    Employer: [employer]    Comment: Estate agent   Social Needs  . Financial resource strain: Not on file  . Food insecurity    Worry: Not on file    Inability: Not on file  . Transportation needs    Medical: Not on file    Non-medical: Not on file  Tobacco Use  . Smoking status: Former Smoker    Types: Cigarettes    Quit date: 07/10/1998    Years since quitting: 20.7  . Smokeless tobacco: Never Used  Substance and Sexual Activity  . Alcohol use: Yes    Alcohol/week: 6.0 standard drinks    Types: 6 Cans of beer per week    Comment: 1 6 pack a week  . Drug use: No  . Sexual activity: Yes  Lifestyle  . Physical activity    Days per week: Not on file    Minutes per session: Not on file  . Stress: Not on file  Relationships  . Social  connections    Talks on phone: Not on file    Gets together: Not on file    Attends religious service: Not on file    Active member of club or organization: Not on file    Attends meetings of clubs or organizations: Not on file    Relationship status: Not on file  . Intimate partner violence    Fear of current or ex partner: Not on file    Emotionally abused: Not on file    Physically abused: Not on file    Forced sexual activity: Not on file  Other Topics Concern  . Not on file  Social History Narrative   HS[employer]   [employer] groundskeeper   Difficult domestic situation, Married '87- separated '01   2 sons- '82, '91; 1 daughter '91   His niece is 49 with metastatic breast cancer (feb '12)    Outpatient Medications Prior to Visit  Medication Sig Dispense Refill  . Multiple Vitamins-Minerals (MULTIVITAMIN WITH MINERALS) tablet Take  1 tablet by mouth daily.    [name] omeprazole (PRILOSEC) 20 MG capsule Take 1 capsule (20 mg total) by mouth daily. 90 capsule 3  . POTASSIUM CITRATE PO Take by mouth.    [name] amLODipine (NORVASC) 10 MG tablet TAKE 1 TABLET (10 MG TOTAL) BY MOUTH DAILY. MUST MAKE APPT FOR FURTHER REFILLS 30 tablet 0  . acetaminophen (TYLENOL) 500 MG tablet Take 1,000 mg by mouth every 6 (six) hours as needed for mild pain or moderate pain.    . carvedilol (COREG) 12.5 MG tablet Take 1 tablet (12.5 mg total) by mouth 2 (two) times daily with a meal. (Patient not taking: Reported on 03/19/2019) 180 tablet 1  . losartan (COZAAR) 100 MG tablet Take 1 tablet (100 mg total) by mouth daily. Must have office visit for refills (Patient not taking: Reported on 03/19/2019) 30 tablet 0  . meloxicam (MOBIC) 7.5 MG tablet Take 1 tablet (7.5 mg total) by mouth daily. 15 tablet 0   No facility-administered medications prior to visit.     Allergies  Allergen Reactions  . Hydrocodone-Acetaminophen Palpitations  . Shrimp [Shellfish Allergy] Other (See Comments)    Cholesterol level becomes very high.    ROS Review of Systems  Respiratory: Positive for shortness of breath.        Just started walking  Musculoskeletal: Positive for back pain.       Intermittent 03/27/2018 had surgery  All other systems reviewed and are negative.     Objective:    Physical Exam  Constitutional: He appears well-developed and well-nourished.  HENT:  Head: Normocephalic.  Eyes: Pupils are equal, round, and reactive to light.  Neck: Normal range of motion. Neck supple.  Cardiovascular: Normal rate and regular rhythm.  Pulmonary/Chest: Effort normal and breath sounds normal.  Abdominal: Soft. Bowel sounds are normal.  Musculoskeletal: Normal range of motion.  Psychiatric: He has a normal mood and affect. His behavior is normal. Thought content normal.    BP 129/87 (BP Location: Left Arm, Patient Position: Sitting, Cuff Size: Normal)    Pulse (!) 102   Temp (!) 97.3 F (36.3 C) (Temporal)   Ht 6' (1.829 m)   Wt 249 lb 12.8 oz (113.3 kg)   SpO2 94%   BMI 33.88 kg/m  Wt Readings from Last 3 Encounters:  03/19/19 249 lb 12.8 oz (113.3 kg)  06/12/18 250 lb 12.8 oz (113.8 kg)  05/20/18 245 lb 9.5 oz (111.4 kg)     Health  Maintenance Due  Topic Date Due  . INFLUENZA VACCINE  12/06/2018    There are no preventive care reminders to display for this patient.  Lab Results  Component Value Date   TSH 0.693 07/10/2016   Lab Results  Component Value Date   WBC 9.8 05/29/2018   HGB 12.5 (L) 05/29/2018   HCT 38.9 (L) 05/29/2018   MCV 94.6 05/29/2018   PLT 383 05/29/2018   Lab Results  Component Value Date   NA 144 09/16/2018   K 4.0 09/16/2018   CO2 24 09/16/2018   GLUCOSE 94 09/16/2018   BUN 15 09/16/2018   CREATININE 1.07 09/16/2018   BILITOT 0.4 09/16/2018   ALKPHOS 46 09/16/2018   AST 16 09/16/2018   ALT 16 09/16/2018   PROT 7.0 09/16/2018   ALBUMIN 4.3 09/16/2018   CALCIUM 9.3 09/16/2018   ANIONGAP 12 05/29/2018   GFR 100.87 03/11/2014   Lab Results  Component Value Date   CHOL 196 01/24/2012   Lab Results  Component Value Date   HDL 57.00 01/24/2012   No results found for: [facility] Lab Results  Component Value Date   TRIG 302.0 (H) 01/24/2012   Lab Results  Component Value Date   CHOLHDL 3 01/24/2012   Lab Results  Component Value Date   HGBA1C 5.4 03/02/2014      Assessment & Plan:  Diagnoses and all orders for this visit:  Encounter for medication refill Patient last visit was May 2020 and for refills and labs for medication refills.  Hypertension, unspecified type Blood pressure today appears to be stable 129/87 no change in medications refilled blood pressure medications as below.  Discussed continue with decreasing sodium in diet encouraging exercising vigorously 30 minutes daily or 150 minutes weekly.  Weight loss may also assist in maintaining control blood pressure -      carvedilol (COREG) 12.5 MG tablet; Take 1 tablet (12.5 mg total) by mouth 2 (two) times daily with a meal. -     amLODipine (NORVASC) 10 MG tablet; Take 1 tablet (10 mg total) by mouth daily. MUST MAKE APPT FOR FURTHER REFILLS  Adult BMI 33.0-33.9 kg/sq m Obesity is 30-39 indicating an excess in caloric intake or underlining conditions. This may lead to other co-morbidities muscle skeletal pain, cardiovascular disease, and diabetes. Lifestyle modifications of diet and exercise may reduce obesity.   Olecranon bursitis of right elbow   Consult with orthopedics this can be surgically removed however likely to return. Other orders -     losartan (COZAAR) 100 MG tablet; Take 1 tablet (100 mg total) by mouth daily. Must have office visit for refills   Meds ordered this encounter  Medications  . losartan (COZAAR) 100 MG tablet    Sig: Take 1 tablet (100 mg total) by mouth daily. Must have office visit for refills    Dispense:  90 tablet    Refill:  1    Must have office visit for refills  . carvedilol (COREG) 12.5 MG tablet    Sig: Take 1 tablet (12.5 mg total) by mouth 2 (two) times daily with a meal.    Dispense:  180 tablet    Refill:  1  . amLODipine (NORVASC) 10 MG tablet    Sig: Take 1 tablet (10 mg total) by mouth daily. MUST MAKE APPT FOR FURTHER REFILLS    Dispense:  90 tablet    Refill:  1    Follow-up: Return in about 6 months (around 09/16/2019) for HTN in  person.    [name], NP

## 2019-03-19 NOTE — Progress Notes (Signed)
249.8  

## 2019-03-19 NOTE — Patient Instructions (Signed)
° °Calorie Counting for Weight Loss °Calories are units of energy. Your body needs a certain amount of calories from food to keep you going throughout the day. When you eat more calories than your body needs, your body stores the extra calories as fat. When you eat fewer calories than your body needs, your body burns fat to get the energy it needs. °Calorie counting means keeping track of how many calories you eat and drink each day. Calorie counting can be helpful if you need to lose weight. If you make sure to eat fewer calories than your body needs, you should lose weight. Ask your health care provider what a healthy weight is for you. °For calorie counting to work, you will need to eat the right number of calories in a day in order to lose a healthy amount of weight per week. A dietitian can help you determine how many calories you need in a day and will give you suggestions on how to reach your calorie goal. °· A healthy amount of weight to lose per week is usually 1-2 lb (0.5-0.9 kg). This usually means that your daily calorie intake should be reduced by 500-750 calories. °· Eating 1,200 - 1,500 calories per day can help most women lose weight. °· Eating 1,500 - 1,800 calories per day can help most men lose weight. °What is my plan? °My goal is to have __________ calories per day. °If I have this many calories per day, I should lose around __________ pounds per week. °What do I need to know about calorie counting? °In order to meet your daily calorie goal, you will need to: °· Find out how many calories are in each food you would like to eat. Try to do this before you eat. °· Decide how much of the food you plan to eat. °· Write down what you ate and how many calories it had. Doing this is called keeping a food log. °To successfully lose weight, it is important to balance calorie counting with a healthy lifestyle that includes regular activity. Aim for 150 minutes of moderate exercise (such as walking) or 75  minutes of vigorous exercise (such as running) each week. °Where do I find calorie information? ° °The number of calories in a food can be found on a Nutrition Facts label. If a food does not have a Nutrition Facts label, try to look up the calories online or ask your dietitian for help. °Remember that calories are listed per serving. If you choose to have more than one serving of a food, you will have to multiply the calories per serving by the amount of servings you plan to eat. For example, the label on a package of bread might say that a serving size is 1 slice and that there are 90 calories in a serving. If you eat 1 slice, you will have eaten 90 calories. If you eat 2 slices, you will have eaten 180 calories. °How do I keep a food log? °Immediately after each meal, record the following information in your food log: °· What you ate. Don't forget to include toppings, sauces, and other extras on the food. °· How much you ate. This can be measured in cups, ounces, or number of items. °· How many calories each food and drink had. °· The total number of calories in the meal. °Keep your food log near you, such as in a small notebook in your pocket, or use a mobile app or website. Some programs will   calculate calories for you and show you how many calories you have left for the day to meet your goal. °What are some calorie counting tips? ° °· Use your calories on foods and drinks that will fill you up and not leave you hungry: °? Some examples of foods that fill you up are nuts and nut butters, vegetables, lean proteins, and high-fiber foods like whole grains. High-fiber foods are foods with more than 5 g fiber per serving. °? Drinks such as sodas, specialty coffee drinks, alcohol, and juices have a lot of calories, yet do not fill you up. °· Eat nutritious foods and avoid empty calories. Empty calories are calories you get from foods or beverages that do not have many vitamins or protein, such as candy, sweets, and  soda. It is better to have a nutritious high-calorie food (such as an avocado) than a food with few nutrients (such as a bag of chips). °· Know how many calories are in the foods you eat most often. This will help you calculate calorie counts faster. °· Pay attention to calories in drinks. Low-calorie drinks include water and unsweetened drinks. °· Pay attention to nutrition labels for "low fat" or "fat free" foods. These foods sometimes have the same amount of calories or more calories than the full fat versions. They also often have added sugar, starch, or salt, to make up for flavor that was removed with the fat. °· Find a way of tracking calories that works for you. Get creative. Try different apps or programs if writing down calories does not work for you. °What are some portion control tips? °· Know how many calories are in a serving. This will help you know how many servings of a certain food you can have. °· Use a measuring cup to measure serving sizes. You could also try weighing out portions on a kitchen scale. With time, you will be able to estimate serving sizes for some foods. °· Take some time to put servings of different foods on your favorite plates, bowls, and cups so you know what a serving looks like. °· Try not to eat straight from a bag or box. Doing this can lead to overeating. Put the amount you would like to eat in a cup or on a plate to make sure you are eating the right portion. °· Use smaller plates, glasses, and bowls to prevent overeating. °· Try not to multitask (for example, watch TV or use your computer) while eating. If it is time to eat, sit down at a table and enjoy your food. This will help you to know when you are full. It will also help you to be aware of what you are eating and how much you are eating. °What are tips for following this plan? °Reading food labels °· Check the calorie count compared to the serving size. The serving size may be smaller than what you are used to  eating. °· Check the source of the calories. Make sure the food you are eating is high in vitamins and protein and low in saturated and trans fats. °Shopping °· Read nutrition labels while you shop. This will help you make healthy decisions before you decide to purchase your food. °· Make a grocery list and stick to it. °Cooking °· Try to cook your favorite foods in a healthier way. For example, try baking instead of frying. °· Use low-fat dairy products. °Meal planning °· Use more fruits and vegetables. Half of your plate should be   fruits and vegetables. °· Include lean proteins like poultry and fish. °How do I count calories when eating out? °· Ask for smaller portion sizes. °· Consider sharing an entree and sides instead of getting your own entree. °· If you get your own entree, eat only half. Ask for a box at the beginning of your meal and put the rest of your entree in it so you are not tempted to eat it. °· If calories are listed on the menu, choose the lower calorie options. °· Choose dishes that include vegetables, fruits, whole grains, low-fat dairy products, and lean protein. °· Choose items that are boiled, broiled, grilled, or steamed. Stay away from items that are buttered, battered, fried, or served with cream sauce. Items labeled "crispy" are usually fried, unless stated otherwise. °· Choose water, low-fat milk, unsweetened iced tea, or other drinks without added sugar. If you want an alcoholic beverage, choose a lower calorie option such as a glass of wine or light beer. °· Ask for dressings, sauces, and syrups on the side. These are usually high in calories, so you should limit the amount you eat. °· If you want a salad, choose a garden salad and ask for grilled meats. Avoid extra toppings like bacon, cheese, or fried items. Ask for the dressing on the side, or ask for olive oil and vinegar or lemon to use as dressing. °· Estimate how many servings of a food you are given. For example, a serving of  cooked rice is ½ cup or about the size of half a baseball. Knowing serving sizes will help you be aware of how much food you are eating at restaurants. The list below tells you how big or small some common portion sizes are based on everyday objects: °? 1 oz--4 stacked dice. °? 3 oz--1 deck of cards. °? 1 tsp--1 die. °? 1 Tbsp--½ a ping-pong ball. °? 2 Tbsp--1 ping-pong ball. °? ½ cup--½ baseball. °? 1 cup--1 baseball. °Summary °· Calorie counting means keeping track of how many calories you eat and drink each day. If you eat fewer calories than your body needs, you should lose weight. °· A healthy amount of weight to lose per week is usually 1-2 lb (0.5-0.9 kg). This usually means reducing your daily calorie intake by 500-750 calories. °· The number of calories in a food can be found on a Nutrition Facts label. If a food does not have a Nutrition Facts label, try to look up the calories online or ask your dietitian for help. °· Use your calories on foods and drinks that will fill you up, and not on foods and drinks that will leave you hungry. °· Use smaller plates, glasses, and bowls to prevent overeating. °This information is not intended to replace advice given to you by your health care provider. Make sure you discuss any questions you have with your health care provider. °Document Released: 04/23/2005 Document Revised: 01/10/2018 Document Reviewed: 03/23/2016 °Elsevier Patient Education © 2020 [employer] ° °

## 2019-03-25 ENCOUNTER — Telehealth (INDEPENDENT_AMBULATORY_CARE_PROVIDER_SITE_OTHER): Payer: Self-pay | Admitting: Primary Care

## 2019-04-15 MED FILL — LOSARTAN POTASSIUM 100 MG T: 100 | 30 days supply | Qty: 30 | Fill #1

## 2019-04-15 MED FILL — ?OMEPRAZOLE 20MG CAP DR: 20 | 30 days supply | Qty: 30 | Fill #5

## 2019-04-15 MED FILL — ?AMLODIPINE BESYLATE 10 MG: 10 | 30 days supply | Qty: 30 | Fill #1

## 2019-04-15 MED FILL — ?CARVEDILOL 12.5 MG TABLET: 12.5 | 30 days supply | Qty: 60 | Fill #1

## 2019-04-27 ENCOUNTER — Telehealth (INDEPENDENT_AMBULATORY_CARE_PROVIDER_SITE_OTHER): Payer: Self-pay

## 2019-04-27 NOTE — Telephone Encounter (Signed)
Patient called to inform PCP that he is having lower right side stomach pain and continues to go to his back. Patient states that he use to have kidney stones and feels like its the same pain. Patient states when he has to urinate it comes out slowly and its a dark color. Patient states he is drinking water, apple, and cranberry juice. Patient states when he eats the right side of his stomach hurts.   Patient schedule an appointment for May 04, 2019 @ 1:30pm.   Please advice [phone] or [phone]

## 2019-04-27 NOTE — Telephone Encounter (Signed)
Please advise. If patient needs appointment sooner than what is scheduled

## 2019-04-27 NOTE — Telephone Encounter (Signed)
If any cancellation we can see him earlier if condition becomes worst proceed to urgent care or emergency room

## 2019-04-28 NOTE — Telephone Encounter (Signed)
Rescheduled patient for 12/23

## 2019-04-29 ENCOUNTER — Other Ambulatory Visit: Payer: Self-pay

## 2019-04-29 ENCOUNTER — Encounter (INDEPENDENT_AMBULATORY_CARE_PROVIDER_SITE_OTHER): Payer: Self-pay | Admitting: Primary Care

## 2019-04-29 ENCOUNTER — Ambulatory Visit (INDEPENDENT_AMBULATORY_CARE_PROVIDER_SITE_OTHER): Payer: Self-pay | Admitting: Primary Care

## 2019-04-29 VITALS — BP 142/88 | HR 83 | Temp 92.3°F | Ht 72.0 in | Wt 251.4 lb

## 2019-04-29 DIAGNOSIS — Z760 Encounter for issue of repeat prescription: Secondary | ICD-10-CM

## 2019-04-29 DIAGNOSIS — I10 Essential (primary) hypertension: Secondary | ICD-10-CM

## 2019-04-29 DIAGNOSIS — R1031 Right lower quadrant pain: Secondary | ICD-10-CM

## 2019-04-29 MED ORDER — CARVEDILOL 12.5 MG PO TABS: 12.5000 mg | ORAL_TABLET | Freq: Two times a day (BID) | ORAL | 1 refills | Status: DC

## 2019-04-29 MED ORDER — LOSARTAN POTASSIUM 100 MG PO TABS: 100.0000 mg | ORAL_TABLET | Freq: Every day | ORAL | 1 refills | Status: DC

## 2019-04-29 MED ORDER — AMLODIPINE BESYLATE 10 MG PO TABS: 10.0000 mg | ORAL_TABLET | Freq: Every day | ORAL | 1 refills | Status: DC

## 2019-04-29 NOTE — Progress Notes (Signed)
Pt believes pain to be associated with kidney stones

## 2019-04-29 NOTE — Progress Notes (Signed)
Acute Office Visit  Subjective:    Patient ID: [name], male    DOB: May 27, 1960, 58 y.o.   MRN: [id number]  Chief Complaint  Patient presents with  . Pain    right side     HPI Patient is in today for abdominal pain describes feels like when I had kidney stones unable to afford to go to the urologist states been drinking a lot of water urine has become lighter and decrease in pain. Initially unable to urinate last 3 days. Elevated Blood pressure can be associated with abdominal pain. Unable to follow up with urology due to unpaid balance and can not return until pays on his bill. He explained how they treat his kidney stones explained I am not a urologist and unaware of the procedures. He also states he has not had a drink/beer in a week.  Past Medical History:  Diagnosis Date  . Alcohol abuse   . Anxiety   . Arthritis   . Diverticulosis of colon (without mention of hemorrhage)   . GERD (gastroesophageal reflux disease)   . History of kidney stones   . Hyperlipemia   . Hypertension    dr Ronalee Belts  norins  . Kidney stones   . Nephrolithiasis   . Pancreatitis    Hx of   . Ureteral stent displacement ([place]) 1990    Past Surgical History:  Procedure Laterality Date  . A2 pulley flexor sheath cyst 4th finger excisional biopsy     '03  . ANTERIOR CRUCIATE LIGAMENT REPAIR     R knee  . ESOPHAGOGASTRODUODENOSCOPY N/A 07/09/2012   Procedure: ESOPHAGOGASTRODUODENOSCOPY (EGD);  Surgeon: Inda Castle, MD;  Location: [facility];  Service: Endoscopy;  Laterality: N/A;  . KNEE SURGERY  2008  . SHOULDER ARTHROSCOPY WITH SUBACROMIAL DECOMPRESSION AND OPEN ROTATOR C Right 12/26/2012   Procedure: RIGHT SHOULDER ARTHROSCOPY WITH SUBACROMIAL DECOMPRESSION MINI OPEN ROTATOR CUFF REPAIR, OPEN BICEP TENODESIS OPEN DISTAL CLAVICLE RESECTION  ;  Surgeon: Augustin Schooling, MD;  Location: [facility];  Service: Orthopedics;  Laterality: Right;  . TRANSFORAMINAL LUMBAR INTERBODY FUSION (TLIF) WITH  PEDICLE SCREW FIXATION 1 LEVEL Right 03/27/2018   Procedure: RIGHT-SIDED LUMBAR 4-5 TRANSFORAMINAL LUMBAR INTERBODY FUSION WITH INSTRUMENTATION AND ALLOGRAFT;  Surgeon: Phylliss Bob, MD;  Location: [facility];  Service: Orthopedics;  Laterality: Right;  . URETEROSCOPY     and stone retreval    Family History  Problem Relation Age of Onset  . Hypertension Father        X4 siblings  . Heart disease Father   . Diabetes Mother        x2 brothers    Social History   Socioeconomic History  . Marital status: Married    Spouse name: Not on file  . Number of children: 3  . Years of education: Not on file  . Highest education level: Not on file  Occupational History    Employer: [employer]    Comment: Grounds Keeper   Tobacco Use  . Smoking status: Former Smoker    Types: Cigarettes    Quit date: 07/10/1998    Years since quitting: 20.8  . Smokeless tobacco: Never Used  Substance and Sexual Activity  . Alcohol use: Yes    Alcohol/week: 6.0 standard drinks    Types: 6 Cans of beer per week    Comment: 1 6 pack a week  . Drug use: No  . Sexual activity: Yes  Other Topics Concern  .  Not on file  Social History Narrative   [employer]   [employer] groundskeeper   Difficult domestic situation, Married '87- separated '01   2 sons- '82, '91; 1 daughter '91   His niece is 18 with metastatic breast cancer (feb '12)   Social Determinants of Health   Financial Resource Strain:   . Difficulty of Paying Living Expenses: Not on file  Food Insecurity:   . Worried About Charity fundraiser in the Last Year: Not on file  . Ran Out of Food in the Last Year: Not on file  Transportation Needs:   . Lack of Transportation (Medical): Not on file  . Lack of Transportation (Non-Medical): Not on file  Physical Activity:   . Days of Exercise per Week: Not on file  . Minutes of Exercise per Session: Not on file  Stress:   . Feeling of Stress : Not on file  Social Connections:   . Frequency of  Communication with Friends and Family: Not on file  . Frequency of Social Gatherings with Friends and Family: Not on file  . Attends Religious Services: Not on file  . Active Member of Clubs or Organizations: Not on file  . Attends Archivist Meetings: Not on file  . Marital Status: Not on file  Intimate Partner Violence:   . Fear of Current or Ex-Partner: Not on file  . Emotionally Abused: Not on file  . Physically Abused: Not on file  . Sexually Abused: Not on file    Outpatient Medications Prior to Visit  Medication Sig Dispense Refill  . acetaminophen (TYLENOL) 500 MG tablet Take 1,000 mg by mouth every 6 (six) hours as needed for mild pain or moderate pain.    . Multiple Vitamins-Minerals (MULTIVITAMIN WITH MINERALS) tablet Take 1 tablet by mouth daily.    [name] omeprazole (PRILOSEC) 20 MG capsule Take 1 capsule (20 mg total) by mouth daily. 90 capsule 3  . POTASSIUM CITRATE PO Take by mouth.    [name] amLODipine (NORVASC) 10 MG tablet Take 1 tablet (10 mg total) by mouth daily. MUST MAKE APPT FOR FURTHER REFILLS 90 tablet 1  . carvedilol (COREG) 12.5 MG tablet Take 1 tablet (12.5 mg total) by mouth 2 (two) times daily with a meal. 180 tablet 1  . losartan (COZAAR) 100 MG tablet Take 1 tablet (100 mg total) by mouth daily. Must have office visit for refills 90 tablet 1   No facility-administered medications prior to visit.    Allergies  Allergen Reactions  . Hydrocodone-Acetaminophen Palpitations  . Shrimp [Shellfish Allergy] Other (See Comments)    Cholesterol level becomes very high.    Review of Systems  Gastrointestinal: Positive for abdominal distention and abdominal pain.  Genitourinary: Positive for enuresis.  Musculoskeletal: Positive for back pain.  All other systems reviewed and are negative.      Objective:    Physical Exam Vitals reviewed.  Constitutional:      Appearance: He is obese.  HENT:     Head: Normocephalic.  Cardiovascular:     Rate and  Rhythm: Normal rate and regular rhythm.  Pulmonary:     Effort: Pulmonary effort is normal.     Breath sounds: Normal breath sounds.  Abdominal:     General: Bowel sounds are normal.     Palpations: Abdomen is soft.  Musculoskeletal:        General: Normal range of motion.  Neurological:     General: No focal deficit present.  Mental Status: He is alert and oriented to person, place, and time.  Psychiatric:        Mood and Affect: Mood normal.        Thought Content: Thought content normal.     BP (!) 142/88 (BP Location: Left Arm, Patient Position: Sitting, Cuff Size: Normal)   Pulse 83   Temp (!) 92.3 F (33.5 C) (Temporal)   Ht 6' (1.829 m)   Wt 251 lb 6.4 oz (114 kg)   SpO2 97%   BMI 34.10 kg/m  Wt Readings from Last 3 Encounters:  04/29/19 251 lb 6.4 oz (114 kg)  03/19/19 249 lb 12.8 oz (113.3 kg)  06/12/18 250 lb 12.8 oz (113.8 kg)    There are no preventive care reminders to display for this patient.  There are no preventive care reminders to display for this patient.   Lab Results  Component Value Date   TSH 0.693 07/10/2016   Lab Results  Component Value Date   WBC 9.8 05/29/2018   HGB 12.5 (L) 05/29/2018   HCT 38.9 (L) 05/29/2018   MCV 94.6 05/29/2018   PLT 383 05/29/2018   Lab Results  Component Value Date   NA 144 09/16/2018   K 4.0 09/16/2018   CO2 24 09/16/2018   GLUCOSE 94 09/16/2018   BUN 15 09/16/2018   CREATININE 1.07 09/16/2018   BILITOT 0.4 09/16/2018   ALKPHOS 46 09/16/2018   AST 16 09/16/2018   ALT 16 09/16/2018   PROT 7.0 09/16/2018   ALBUMIN 4.3 09/16/2018   CALCIUM 9.3 09/16/2018   ANIONGAP 12 05/29/2018   GFR 100.87 03/11/2014   Lab Results  Component Value Date   CHOL 196 01/24/2012   Lab Results  Component Value Date   HDL 57.00 01/24/2012   No results found for: [facility] Lab Results  Component Value Date   TRIG 302.0 (H) 01/24/2012   Lab Results  Component Value Date   CHOLHDL 3 01/24/2012   Lab  Results  Component Value Date   HGBA1C 5.4 03/02/2014       Assessment & Plan:  Milner was seen today for pain.  Diagnoses and all orders for this visit:  Hypertension, unspecified type Uncontrolled today 142/88 Goal </= 130/80 medication compliance , exercise and diet medication decase sodium intake. -     amLODipine (NORVASC) 10 MG tablet; Take 1 tablet (10 mg total) by mouth daily. MUST MAKE APPT FOR FURTHER REFILLS -     carvedilol (COREG) 12.5 MG tablet; Take 1 tablet (12.5 mg total) by mouth 2 (two) times daily with a meal. -     Lipid panel -     Complete Metabolic Panel with GFR -     CBC with Differential  Right lower quadrant abdominal pain Hx of pancreatitis unable to define or associate causes what improves or makes works. Generally epigastric or hepatic are causative factors . Labs CMP determine liver function   Other orders/Medication Refill -     losartan (COZAAR) 100 MG tablet; Take 1 tablet (100 mg total) by mouth daily. Must have office visit for refills   Meds ordered this encounter  Medications  . amLODipine (NORVASC) 10 MG tablet    Sig: Take 1 tablet (10 mg total) by mouth daily. MUST MAKE APPT FOR FURTHER REFILLS    Dispense:  90 tablet    Refill:  1  . losartan (COZAAR) 100 MG tablet    Sig: Take 1 tablet (100 mg total) by mouth  daily. Must have office visit for refills    Dispense:  90 tablet    Refill:  1    Must have office visit for refills  . carvedilol (COREG) 12.5 MG tablet    Sig: Take 1 tablet (12.5 mg total) by mouth 2 (two) times daily with a meal.    Dispense:  180 tablet    Refill:  1     [name], NP

## 2019-04-29 NOTE — Patient Instructions (Signed)
Dietary Guidelines to Help Prevent Kidney Stones Kidney stones are deposits of minerals and salts that form inside your kidneys. Your risk of developing kidney stones may be greater depending on your diet, your lifestyle, the medicines you take, and whether you have certain medical conditions. Most people can reduce their chances of developing kidney stones by following the instructions below. Depending on your overall health and the type of kidney stones you tend to develop, your dietitian may give you more specific instructions. What are tips for following this plan? Reading food labels  Choose foods with "no salt added" or "low-salt" labels. Limit your sodium intake to less than 1500 mg per day.  Choose foods with calcium for each meal and snack. Try to eat about 300 mg of calcium at each meal. Foods that contain 200-500 mg of calcium per serving include: ? 8 oz (237 ml) of milk, fortified nondairy milk, and fortified fruit juice. ? 8 oz (237 ml) of kefir, yogurt, and soy yogurt. ? 4 oz (118 ml) of tofu. ? 1 oz of cheese. ? 1 cup (300 g) of dried figs. ? 1 cup (91 g) of cooked broccoli. ? 1-3 oz can of sardines or mackerel.  Most people need 1000 to 1500 mg of calcium each day. Talk to your dietitian about how much calcium is recommended for you. Shopping  Buy plenty of fresh fruits and vegetables. Most people do not need to avoid fruits and vegetables, even if they contain nutrients that may contribute to kidney stones.  When shopping for convenience foods, choose: ? Whole pieces of fruit. ? Premade salads with dressing on the side. ? Low-fat fruit and yogurt smoothies.  Avoid buying frozen meals or prepared deli foods.  Look for foods with live cultures, such as yogurt and kefir. Cooking  Do not add salt to food when cooking. Place a salt shaker on the table and allow each person to add his or her own salt to taste.  Use vegetable protein, such as beans, textured vegetable  protein (TVP), or tofu instead of meat in pasta, casseroles, and soups. Meal planning   Eat less salt, if told by your dietitian. To do this: ? Avoid eating processed or premade food. ? Avoid eating fast food.  Eat less animal protein, including cheese, meat, poultry, or fish, if told by your dietitian. To do this: ? Limit the number of times you have meat, poultry, fish, or cheese each week. Eat a diet free of meat at least 2 days a week. ? Eat only one serving each day of meat, poultry, fish, or seafood. ? When you prepare animal protein, cut pieces into small portion sizes. For most meat and fish, one serving is about the size of one deck of cards.  Eat at least 5 servings of fresh fruits and vegetables each day. To do this: ? Keep fruits and vegetables on hand for snacks. ? Eat 1 piece of fruit or a handful of berries with breakfast. ? Have a salad and fruit at lunch. ? Have two kinds of vegetables at dinner.  Limit foods that are high in a substance called oxalate. These include: ? Spinach. ? Rhubarb. ? Beets. ? Potato chips and french fries. ? Nuts.  If you regularly take a diuretic medicine, make sure to eat at least 1-2 fruits or vegetables high in potassium each day. These include: ? Avocado. ? Banana. ? Orange, prune, carrot, or tomato juice. ? Baked potato. ? Cabbage. ? Beans and split   peas. General instructions   Drink enough fluid to keep your urine clear or pale yellow. This is the most important thing you can do.  Talk to your health care provider and dietitian about taking daily supplements. Depending on your health and the cause of your kidney stones, you may be advised: ? Not to take supplements with vitamin C. ? To take a calcium supplement. ? To take a daily probiotic supplement. ? To take other supplements such as magnesium, fish oil, or vitamin B6.  Take all medicines and supplements as told by your health care provider.  Limit alcohol intake to no  more than 1 drink a day for nonpregnant women and 2 drinks a day for men. One drink equals 12 oz of beer, 5 oz of wine, or 1 oz of hard liquor.  Lose weight if told by your health care provider. Work with your dietitian to find strategies and an eating plan that works best for you. What foods are not recommended? Limit your intake of the following foods, or as told by your dietitian. Talk to your dietitian about specific foods you should avoid based on the type of kidney stones and your overall health. Grains Breads. Bagels. Rolls. Baked goods. Salted crackers. Cereal. Pasta. Vegetables Spinach. Rhubarb. Beets. Canned vegetables. Angie Fava. Olives. Meats and other protein foods Nuts. Nut butters. Large portions of meat, poultry, or fish. Salted or cured meats. Deli meats. Hot dogs. Sausages. Dairy Cheese. Beverages Regular soft drinks. Regular vegetable juice. Seasonings and other foods Seasoning blends with salt. Salad dressings. Canned soups. Soy sauce. Ketchup. Barbecue sauce. Canned pasta sauce. Casseroles. Pizza. Lasagna. Frozen meals. Potato chips. [place] fries. Summary  You can reduce your risk of kidney stones by making changes to your diet.  The most important thing you can do is drink enough fluid. You should drink enough fluid to keep your urine clear or pale yellow.  Ask your health care provider or dietitian how much protein from animal sources you should eat each day, and also how much salt and calcium you should have each day. This information is not intended to replace advice given to you by your health care provider. Make sure you discuss any questions you have with your health care provider. Document Released: 08/18/2010 Document Revised: 08/13/2018 Document Reviewed: 04/03/2016 Elsevier Patient Education  2020 [employer].

## 2019-04-30 LAB — LIPID PANEL
Chol/HDL Ratio: 3.2 ratio (ref 0.0–5.0)
Cholesterol, Total: 212 mg/dL — ABNORMAL HIGH (ref 100–199)
HDL: 66 mg/dL (ref >39)
LDL Chol Calc (NIH): 111 mg/dL — ABNORMAL HIGH (ref 0–99)
Triglycerides: 205 mg/dL — ABNORMAL HIGH (ref 0–149)
VLDL Cholesterol Cal: 35 mg/dL (ref 5–40)

## 2019-04-30 LAB — CMP14+EGFR
ALT: 45 IU/L — ABNORMAL HIGH (ref 0–44)
AST: 33 IU/L (ref 0–40)
Albumin/Globulin Ratio: 2 (ref 1.2–2.2)
Albumin: 4.9 g/dL (ref 3.8–4.9)
Alkaline Phosphatase: 66 IU/L (ref 39–117)
BUN/Creatinine Ratio: 12 (ref 9–20)
BUN: 15 mg/dL (ref 6–24)
Bilirubin Total: 0.6 mg/dL (ref 0.0–1.2)
CO2: 24 mmol/L (ref 20–29)
Calcium: 9.7 mg/dL (ref 8.7–10.2)
Chloride: 102 mmol/L (ref 96–106)
Creatinine, Ser: 1.26 mg/dL (ref 0.76–1.27)
GFR calc Af Amer: 73 mL/min/{1.73_m2} (ref >59)
GFR calc non Af Amer: 63 mL/min/{1.73_m2} (ref >59)
Globulin, Total: 2.5 g/dL (ref 1.5–4.5)
Glucose: 123 mg/dL — ABNORMAL HIGH (ref 65–99)
Potassium: 4.2 mmol/L (ref 3.5–5.2)
Sodium: 140 mmol/L (ref 134–144)
Total Protein: 7.4 g/dL (ref 6.0–8.5)

## 2019-04-30 LAB — CBC WITH DIFFERENTIAL/PLATELET
Basophils Absolute: 0.1 10*3/uL (ref 0.0–0.2)
Basos: 1 %
EOS (ABSOLUTE): 0.1 10*3/uL (ref 0.0–0.4)
Eos: 1 %
Hematocrit: 37.5 % (ref 37.5–51.0)
Hemoglobin: 13.2 g/dL (ref 13.0–17.7)
Immature Grans (Abs): 0 10*3/uL (ref 0.0–0.1)
Immature Granulocytes: 0 %
Lymphocytes Absolute: 2.9 10*3/uL (ref 0.7–3.1)
Lymphs: 29 %
MCH: 32.2 pg (ref 26.6–33.0)
MCHC: 35.2 g/dL (ref 31.5–35.7)
MCV: 92 fL (ref 79–97)
Monocytes Absolute: 1 10*3/uL — ABNORMAL HIGH (ref 0.1–0.9)
Monocytes: 10 %
Neutrophils Absolute: 6.2 10*3/uL (ref 1.4–7.0)
Neutrophils: 59 %
Platelets: 264 10*3/uL (ref 150–450)
RBC: 4.1 x10E6/uL — ABNORMAL LOW (ref 4.14–5.80)
RDW: 11.7 % (ref 11.6–15.4)
WBC: 10.3 10*3/uL (ref 3.4–10.8)

## 2019-05-04 ENCOUNTER — Ambulatory Visit (INDEPENDENT_AMBULATORY_CARE_PROVIDER_SITE_OTHER): Payer: Self-pay | Admitting: Primary Care

## 2019-05-05 ENCOUNTER — Other Ambulatory Visit (INDEPENDENT_AMBULATORY_CARE_PROVIDER_SITE_OTHER): Payer: Self-pay | Admitting: Primary Care

## 2019-05-05 DIAGNOSIS — E785 Hyperlipidemia, unspecified: Secondary | ICD-10-CM

## 2019-05-05 MED ORDER — PRAVASTATIN SODIUM 40 MG PO TABS: 40.0000 mg | ORAL_TABLET | Freq: Every day | ORAL | 3 refills | Status: DC

## 2019-05-05 MED FILL — PRAVASTATIN NA 40 MG TAB: 40 | 30 days supply | Qty: 30 | Fill #0

## 2019-05-12 MED FILL — LOSARTAN POTASSIUM 100 MG T: 100 | 30 days supply | Qty: 30 | Fill #2

## 2019-05-12 MED FILL — AMLODIPINE BESYLATE 10 MG T: 10 | 30 days supply | Qty: 30 | Fill #2

## 2019-05-12 MED FILL — OMEPRAZOLE 20 MG CAP: 20 | 30 days supply | Qty: 30 | Fill #6

## 2019-05-12 MED FILL — CARVEDILOL 12.5 MG TABLET: 12.5 | 30 days supply | Qty: 60 | Fill #2

## 2019-06-17 ENCOUNTER — Other Ambulatory Visit (INDEPENDENT_AMBULATORY_CARE_PROVIDER_SITE_OTHER): Payer: Self-pay | Admitting: Family Medicine

## 2019-06-17 DIAGNOSIS — K219 Gastro-esophageal reflux disease without esophagitis: Secondary | ICD-10-CM

## 2019-06-17 MED FILL — AMLODIPINE BESYLATE 10 MG T: 10 | 30 days supply | Qty: 30 | Fill #3

## 2019-06-17 MED FILL — LOSARTAN POTASSIUM 100 MG T: 100 | 30 days supply | Qty: 30 | Fill #3

## 2019-06-17 MED FILL — CARVEDILOL 12.5 MG TABLET: 12.5 | 30 days supply | Qty: 60 | Fill #3

## 2019-06-17 MED FILL — PRAVASTATIN NA 40 MG TAB: 40 | 30 days supply | Qty: 30 | Fill #1

## 2019-06-18 ENCOUNTER — Other Ambulatory Visit: Payer: Self-pay

## 2019-06-18 ENCOUNTER — Ambulatory Visit
Admission: EM | Admit: 2019-06-18 | Discharge: 2019-06-18 | Disposition: A | Discharge status: Home / Self Care | Payer: Self-pay | Attending: Family Medicine | Admitting: Family Medicine

## 2019-06-18 DIAGNOSIS — R0789 Other chest pain: Secondary | ICD-10-CM

## 2019-06-18 DIAGNOSIS — R0602 Shortness of breath: Secondary | ICD-10-CM

## 2019-06-18 DIAGNOSIS — I10 Essential (primary) hypertension: Secondary | ICD-10-CM

## 2019-06-18 MED FILL — OMEPRAZOLE 20 MG CAP: 20 | 30 days supply | Qty: 30 | Fill #0

## 2019-06-18 NOTE — Discharge Instructions (Addendum)
Follow-up with primary care physician to discuss possibility of stress test

## 2019-06-18 NOTE — ED Triage Notes (Signed)
Pt c/o pain to lt side/lt chest/lt shoulder/lt neck/lt arm x2 days. States using a heat pad with some relief. States had a few episodes of SOB and lightheaded.

## 2019-06-18 NOTE — ED Provider Notes (Signed)
[facility]    CSN: [id number] Arrival date & time: 06/18/19  1734      History   Chief Complaint Chief Complaint  Patient presents with  . Chest Pain    HPI DEMON HOTOP is a 59 y.o. male.   59 year old man with left-sided chest pain for the past 2 days.  Pain is described as left-sided sharp.  There is no associated breathing difficulties 1 time after going up some stairs he felt a little more tired than usual.  Risk factors for heart disease include hypertension and lipids.  Previously smoked.  Denies diabetes.  No cough or congestion.  HPI  Past Medical History:  Diagnosis Date  . Alcohol abuse   . Anxiety   . Arthritis   . Diverticulosis of colon (without mention of hemorrhage)   . GERD (gastroesophageal reflux disease)   . History of kidney stones   . Hyperlipemia   . Hypertension    dr Ronalee Belts  norins  . Kidney stones   . Nephrolithiasis   . Pancreatitis    Hx of   . Ureteral stent displacement [facility]) 1990    Patient Active Problem List   Diagnosis Date Noted  . Alcoholic pancreatitis 123456  . Normal anion gap metabolic acidosis 123456  . Radiculopathy 03/27/2018  . 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/02/2017  . Renal stone 10/08/2016  . Non-compliant behavior 12/02/2013  . Routine health maintenance 01/27/2012  . Hypertriglyceridemia 01/27/2012  . Knee pain, left 11/12/2011  . BPH (benign prostatic hyperplasia) 11/12/2011  . GERD 08/10/2008  . Essential hypertension 04/14/2008  . Alcohol abuse 10/26/2007  . TRIGGER FINGER, LEFT MIDDLE 05/12/2007  . NEPHROLITHIASIS, HX OF 05/12/2007    Past Surgical History:  Procedure Laterality Date  . A2 pulley flexor sheath cyst 4th finger excisional biopsy     '03  . ANTERIOR CRUCIATE LIGAMENT REPAIR     R knee  . ESOPHAGOGASTRODUODENOSCOPY N/A 07/09/2012   Procedure: ESOPHAGOGASTRODUODENOSCOPY (EGD);  Surgeon: Inda Castle, MD;  Location: [facility];  Service: Endoscopy;   Laterality: N/A;  . KNEE SURGERY  2008  . SHOULDER ARTHROSCOPY WITH SUBACROMIAL DECOMPRESSION AND OPEN ROTATOR C Right 12/26/2012   Procedure: RIGHT SHOULDER ARTHROSCOPY WITH SUBACROMIAL DECOMPRESSION MINI OPEN ROTATOR CUFF REPAIR, OPEN BICEP TENODESIS OPEN DISTAL CLAVICLE RESECTION  ;  Surgeon: Augustin Schooling, MD;  Location: [place];  Service: Orthopedics;  Laterality: Right;  . TRANSFORAMINAL LUMBAR INTERBODY FUSION (TLIF) WITH PEDICLE SCREW FIXATION 1 LEVEL Right 03/27/2018   Procedure: RIGHT-SIDED LUMBAR 4-5 TRANSFORAMINAL LUMBAR INTERBODY FUSION WITH INSTRUMENTATION AND ALLOGRAFT;  Surgeon: Phylliss Bob, MD;  Location: [place];  Service: Orthopedics;  Laterality: Right;  . URETEROSCOPY     and stone retreval       Home Medications    Prior to Admission medications   Medication Sig Start Date End Date Taking? Authorizing Provider  acetaminophen (TYLENOL) 500 MG tablet Take 1,000 mg by mouth every 6 (six) hours as needed for mild pain or moderate pain.    [provider]  amLODipine (NORVASC) 10 MG tablet Take 1 tablet (10 mg total) by mouth daily. MUST MAKE APPT FOR FURTHER REFILLS 04/29/19   [name], NP  carvedilol (COREG) 12.5 MG tablet Take 1 tablet (12.5 mg total) by mouth 2 (two) times daily with a meal. 04/29/19   [name], NP  losartan (COZAAR) 100 MG tablet Take 1 tablet (100 mg total) by mouth daily. Must have office visit for  refills 04/29/19   [name], NP  Multiple Vitamins-Minerals (MULTIVITAMIN WITH MINERALS) tablet Take 1 tablet by mouth daily.    [provider]  omeprazole (PRILOSEC) 20 MG capsule TAKE 1 CAPSULE (20 MG TOTAL) BY MOUTH DAILY. 06/17/19   Charlott Rakes, MD  POTASSIUM CITRATE PO Take by mouth.    [provider]  pravastatin (PRAVACHOL) 40 MG tablet Take 1 tablet (40 mg total) by mouth daily. 05/05/19   [name], NP    Family History Family History  Problem Relation Age of Onset  .  Hypertension Father        X4 siblings  . Heart disease Father   . Diabetes Mother        x2 brothers    Social History Social History   Tobacco Use  . Smoking status: Former Smoker    Types: Cigarettes    Quit date: 07/10/1998    Years since quitting: 20.9  . Smokeless tobacco: Never Used  Substance Use Topics  . Alcohol use: Yes    Alcohol/week: 6.0 standard drinks    Types: 6 Cans of beer per week    Comment: 1 6 pack a week  . Drug use: No     Allergies   Hydrocodone-acetaminophen and Shrimp [shellfish allergy]   Review of Systems Review of Systems  Respiratory: Positive for shortness of breath.   Cardiovascular: Positive for chest pain.  All other systems reviewed and are negative.    Physical Exam Triage Vital Signs ED Triage Vitals  Enc Vitals Group     BP 06/18/19 1753 (!) 162/109     Pulse Rate 06/18/19 1753 96     Resp 06/18/19 1753 18     Temp 06/18/19 1753 98.9 F (37.2 C)     Temp Source 06/18/19 1753 Oral     SpO2 06/18/19 1753 97 %     Weight --      Height --      Head Circumference --      Peak Flow --      Pain Score 06/18/19 1754 4     Pain Loc --      Pain Edu? --      Excl. in [place]? --    No data found.  Updated Vital Signs BP (!) 162/109 (BP Location: Left Arm)   Pulse 96   Temp 98.9 F (37.2 C) (Oral)   Resp 18   SpO2 97%   Visual Acuity Right Eye Distance:   Left Eye Distance:   Bilateral Distance:    Right Eye Near:   Left Eye Near:    Bilateral Near:     Physical Exam Vitals and nursing note reviewed.  Constitutional:      Appearance: He is well-developed. He is obese.  Cardiovascular:     Heart sounds: Normal heart sounds.  Pulmonary:     Effort: Pulmonary effort is normal.     Breath sounds: Normal breath sounds.  Abdominal:     General: Bowel sounds are normal.     Palpations: Abdomen is soft.  Neurological:     General: No focal deficit present.     Mental Status: He is alert.      UC Treatments /  Results  Labs (all labs ordered are listed, but only abnormal results are displayed) Labs Reviewed - No data to display  EKG EKG shows some nonspecific T wave changes that were not present on the last study from 2019.  Radiology No results found.  Procedures Procedures (including critical care time)  Medications Ordered in UC Medications - No data to display  Initial Impression / Assessment and Plan / UC Course  I have reviewed the triage vital signs and the nursing notes.  Pertinent labs & imaging results that were available during my care of the patient were reviewed by me and considered in my medical decision making (see chart for details).     Atypical chest pain.  Mild EKG changes but with risk factors.  Have recommended he follow-up with his PCP to entertain the possibility of a stress test Final Clinical Impressions(s) / UC Diagnoses   Final diagnoses:  None   Discharge Instructions   None    ED Prescriptions    None     PDMP not reviewed this encounter.   Wardell Honour, MD 06/18/19 [phone]

## 2019-06-19 ENCOUNTER — Telehealth (INDEPENDENT_AMBULATORY_CARE_PROVIDER_SITE_OTHER): Payer: Self-pay

## 2019-06-19 NOTE — Telephone Encounter (Signed)
Patient made aware and schedule an appointment for Feb 24 @ 1:50pm.

## 2019-06-19 NOTE — Telephone Encounter (Signed)
Patient called to inform PCP that he went to the urgent care yesterday and was advice to call PCP and see if she can order a stress test.  Please look at urgent care notes from Feb 11.   Please advice patient [phone] (cell) [phone] (home)

## 2019-06-19 NOTE — Telephone Encounter (Signed)
Patient was told to have follow up with PCP to discuss possibility of stress test. Please schedule patient sooner that march to discuss chest pain.

## 2019-07-01 ENCOUNTER — Encounter (INDEPENDENT_AMBULATORY_CARE_PROVIDER_SITE_OTHER): Payer: Self-pay | Admitting: Primary Care

## 2019-07-01 ENCOUNTER — Ambulatory Visit (INDEPENDENT_AMBULATORY_CARE_PROVIDER_SITE_OTHER): Payer: Self-pay | Admitting: Primary Care

## 2019-07-01 ENCOUNTER — Other Ambulatory Visit: Payer: Self-pay

## 2019-07-01 VITALS — BP 139/93 | HR 74 | Temp 97.2°F | Ht 72.0 in | Wt 250.8 lb

## 2019-07-01 DIAGNOSIS — R0789 Other chest pain: Secondary | ICD-10-CM

## 2019-07-01 DIAGNOSIS — F5101 Primary insomnia: Secondary | ICD-10-CM

## 2019-07-01 DIAGNOSIS — Z09 Encounter for follow-up examination after completed treatment for conditions other than malignant neoplasm: Secondary | ICD-10-CM

## 2019-07-01 DIAGNOSIS — E785 Hyperlipidemia, unspecified: Secondary | ICD-10-CM

## 2019-07-01 DIAGNOSIS — I10 Essential (primary) hypertension: Secondary | ICD-10-CM

## 2019-07-01 DIAGNOSIS — M7021 Olecranon bursitis, right elbow: Secondary | ICD-10-CM

## 2019-07-01 MED ORDER — AMLODIPINE BESYLATE 10 MG PO TABS: 10.0000 mg | ORAL_TABLET | Freq: Every day | ORAL | 1 refills | Status: DC

## 2019-07-01 MED ORDER — PRAVASTATIN SODIUM 40 MG PO TABS: 40.0000 mg | ORAL_TABLET | Freq: Every day | ORAL | 3 refills | Status: DC

## 2019-07-01 MED ORDER — LOSARTAN POTASSIUM 100 MG PO TABS: 100.0000 mg | ORAL_TABLET | Freq: Every day | ORAL | 1 refills | Status: DC

## 2019-07-01 MED ORDER — CARVEDILOL 12.5 MG PO TABS: 12.5000 mg | ORAL_TABLET | Freq: Two times a day (BID) | ORAL | 1 refills | Status: DC

## 2019-07-01 NOTE — Patient Instructions (Signed)

## 2019-07-01 NOTE — Progress Notes (Signed)
Established Patient Office Visit  Subjective:  Patient ID: [name], male    DOB: 06/16/1960  Age: 59 y.o. MRN: [id number]  CC:  Chief Complaint  Patient presents with  . Chest Pain    seen at urgent care   . Medication Refill    HPI [name] presents for follow up from emergency room visit for left sided chest pain that had been taking place for 2 days radiated to neck and shoulder. He was not sure if he pulled a muscle but chest pain since it did not resolve he presented to the ED.  Past Medical History:  Diagnosis Date  . Alcohol abuse   . Anxiety   . Arthritis   . Diverticulosis of colon (without mention of hemorrhage)   . GERD (gastroesophageal reflux disease)   . History of kidney stones   . Hyperlipemia   . Hypertension    dr Ronalee Belts  norins  . Kidney stones   . Nephrolithiasis   . Pancreatitis    Hx of   . Ureteral stent displacement ([place]) 1990    Past Surgical History:  Procedure Laterality Date  . A2 pulley flexor sheath cyst 4th finger excisional biopsy     '03  . ANTERIOR CRUCIATE LIGAMENT REPAIR     R knee  . ESOPHAGOGASTRODUODENOSCOPY N/A 07/09/2012   Procedure: ESOPHAGOGASTRODUODENOSCOPY (EGD);  Surgeon: Inda Castle, MD;  Location: [facility];  Service: Endoscopy;  Laterality: N/A;  . KNEE SURGERY  2008  . SHOULDER ARTHROSCOPY WITH SUBACROMIAL DECOMPRESSION AND OPEN ROTATOR C Right 12/26/2012   Procedure: RIGHT SHOULDER ARTHROSCOPY WITH SUBACROMIAL DECOMPRESSION MINI OPEN ROTATOR CUFF REPAIR, OPEN BICEP TENODESIS OPEN DISTAL CLAVICLE RESECTION  ;  Surgeon: Augustin Schooling, MD;  Location: [place];  Service: Orthopedics;  Laterality: Right;  . TRANSFORAMINAL LUMBAR INTERBODY FUSION (TLIF) WITH PEDICLE SCREW FIXATION 1 LEVEL Right 03/27/2018   Procedure: RIGHT-SIDED LUMBAR 4-5 TRANSFORAMINAL LUMBAR INTERBODY FUSION WITH INSTRUMENTATION AND ALLOGRAFT;  Surgeon: Phylliss Bob, MD;  Location: [place];  Service: Orthopedics;  Laterality: Right;  .  URETEROSCOPY     and stone retreval    Family History  Problem Relation Age of Onset  . Hypertension Father        X4 siblings  . Heart disease Father   . Diabetes Mother        x2 brothers    Social History   Socioeconomic History  . Marital status: Married    Spouse name: Not on file  . Number of children: 3  . Years of education: Not on file  . Highest education level: Not on file  Occupational History    Employer: [employer]    Comment: Grounds Keeper   Tobacco Use  . Smoking status: Former Smoker    Types: Cigarettes    Quit date: 07/10/1998    Years since quitting: 20.9  . Smokeless tobacco: Never Used  Substance and Sexual Activity  . Alcohol use: Yes    Alcohol/week: 6.0 standard drinks    Types: 6 Cans of beer per week    Comment: 1 6 pack a week  . Drug use: No  . Sexual activity: Yes  Other Topics Concern  . Not on file  Social History Narrative   HS[employer]   [employer] groundskeeper   Difficult domestic situation, Married '87- separated '01   2 sons- '82, '91; 1 daughter '91   His niece is 55 with metastatic breast cancer (feb '12)  Social Determinants of Health   Financial Resource Strain:   . Difficulty of Paying Living Expenses: Not on file  Food Insecurity:   . Worried About Charity fundraiser in the Last Year: Not on file  . Ran Out of Food in the Last Year: Not on file  Transportation Needs:   . Lack of Transportation (Medical): Not on file  . Lack of Transportation (Non-Medical): Not on file  Physical Activity:   . Days of Exercise per Week: Not on file  . Minutes of Exercise per Session: Not on file  Stress:   . Feeling of Stress : Not on file  Social Connections:   . Frequency of Communication with Friends and Family: Not on file  . Frequency of Social Gatherings with Friends and Family: Not on file  . Attends Religious Services: Not on file  . Active Member of Clubs or Organizations: Not on file  . Attends Archivist  Meetings: Not on file  . Marital Status: Not on file  Intimate Partner Violence:   . Fear of Current or Ex-Partner: Not on file  . Emotionally Abused: Not on file  . Physically Abused: Not on file  . Sexually Abused: Not on file    Outpatient Medications Prior to Visit  Medication Sig Dispense Refill  . acetaminophen (TYLENOL) 500 MG tablet Take 1,000 mg by mouth every 6 (six) hours as needed for mild pain or moderate pain.    [name] amLODipine (NORVASC) 10 MG tablet Take 1 tablet (10 mg total) by mouth daily. MUST MAKE APPT FOR FURTHER REFILLS 90 tablet 1  . carvedilol (COREG) 12.5 MG tablet Take 1 tablet (12.5 mg total) by mouth 2 (two) times daily with a meal. 180 tablet 1  . losartan (COZAAR) 100 MG tablet Take 1 tablet (100 mg total) by mouth daily. Must have office visit for refills 90 tablet 1  . Multiple Vitamins-Minerals (MULTIVITAMIN WITH MINERALS) tablet Take 1 tablet by mouth daily.    [name] omeprazole (PRILOSEC) 20 MG capsule TAKE 1 CAPSULE (20 MG TOTAL) BY MOUTH DAILY. 30 capsule 3  . POTASSIUM CITRATE PO Take by mouth.    . pravastatin (PRAVACHOL) 40 MG tablet Take 1 tablet (40 mg total) by mouth daily. 90 tablet 3   No facility-administered medications prior to visit.    Allergies  Allergen Reactions  . Hydrocodone-Acetaminophen Palpitations  . Shrimp [Shellfish Allergy] Other (See Comments)    Cholesterol level becomes very high.    ROS Review of Systems  Psychiatric/Behavioral: Positive for sleep disturbance.  All other systems reviewed and are negative.     Objective:    Physical Exam  Constitutional: He is oriented to person, place, and time. He appears well-developed and well-nourished.  HENT:  Head: Normocephalic.  Eyes: Pupils are equal, round, and reactive to light. EOM are normal.  Cardiovascular: Normal rate and regular rhythm.  Pulmonary/Chest: Effort normal and breath sounds normal.  Abdominal: Soft. Bowel sounds are normal.  Musculoskeletal:         General: Normal range of motion.     Cervical back: Normal range of motion and neck supple.  Neurological: He is alert and oriented to person, place, and time.  Skin: Skin is warm and dry.  Psychiatric: He has a normal mood and affect. His behavior is normal. Judgment and thought content normal.    BP (!) 139/93 (BP Location: Left Arm, Patient Position: Sitting, Cuff Size: Normal)   Pulse 74   Temp [name])  97.2 F (36.2 C) (Temporal)   Ht 6' (1.829 m)   Wt 250 lb 12.8 oz (113.8 kg)   SpO2 94%   BMI 34.01 kg/m  Wt Readings from Last 3 Encounters:  07/01/19 250 lb 12.8 oz (113.8 kg)  04/29/19 251 lb 6.4 oz (114 kg)  03/19/19 249 lb 12.8 oz (113.3 kg)     There are no preventive care reminders to display for this patient.  There are no preventive care reminders to display for this patient.  Lab Results  Component Value Date   TSH 0.693 07/10/2016   Lab Results  Component Value Date   WBC 10.3 04/29/2019   HGB 13.2 04/29/2019   HCT 37.5 04/29/2019   MCV 92 04/29/2019   PLT 264 04/29/2019   Lab Results  Component Value Date   NA 140 04/29/2019   K 4.2 04/29/2019   CO2 24 04/29/2019   GLUCOSE 123 (H) 04/29/2019   BUN 15 04/29/2019   CREATININE 1.26 04/29/2019   BILITOT 0.6 04/29/2019   ALKPHOS 66 04/29/2019   AST 33 04/29/2019   ALT 45 (H) 04/29/2019   PROT 7.4 04/29/2019   ALBUMIN 4.9 04/29/2019   CALCIUM 9.7 04/29/2019   ANIONGAP 12 05/29/2018   GFR 100.87 03/11/2014   Lab Results  Component Value Date   CHOL 212 (H) 04/29/2019   Lab Results  Component Value Date   HDL 66 04/29/2019   Lab Results  Component Value Date   LDLCALC 111 (H) 04/29/2019   Lab Results  Component Value Date   TRIG 205 (H) 04/29/2019   Lab Results  Component Value Date   CHOLHDL 3.2 04/29/2019   Lab Results  Component Value Date   HGBA1C 5.4 03/02/2014      Assessment & Plan:  Zayvon was seen today for chest pain and medication refill.  Diagnoses and all orders for  this visit:  Essential hypertension Elevated at this visit but improving. He is taking all his prescribe medications for bp. Discussed goal blood pressure goal 130/80 , low-sodium, DASH diet, medication compliance, 150 minutes of moderate intensity exercise per week.  Primary insomnia Patient informed that he loves candy and eats candy right up to going to bed and in the bed. Ask would you give a child candy before bed-"no" why it will make them hyper. Same concept . Maybe stop eating candy 2-3 hours before bed. Try Melatonin 5mg  as needed at bed time  Olecranon bursitis of right elbow Improving smaller but remains feels it it ugly. If remove it more than likely return. Consulted with orthopedist   Hypertension, unspecified type Blood pressure not at goal 130/80 but significant improvement. Eat healthy heart low-sodium, DASH diet, medication compliance, 150 minutes of moderate intensity exercise per week. -     amLODipine (NORVASC) 10 MG tablet; Take 1 tablet (10 mg total) by mouth daily. MUST MAKE APPT FOR FURTHER REFILLS -     carvedilol (COREG) 12.5 MG tablet; Take 1 tablet (12.5 mg total) by mouth 2 (two) times daily with a meal.  Hyperlipidemia, unspecified hyperlipidemia type -     pravastatin (PRAVACHOL) 40 MG tablet; Take 1 tablet (40 mg total) by mouth daily.  Other chest pain Seen in ED on 06/18/2019 EKG shows some nonspecific T wave changes that were not present on the last study from 2019. -     Ambulatory referral to Cardiology  Hospital discharge follow-up Interpretation from ED encounter Atypical chest pain.  Mild EKG changes but with  risk factors.  Have recommended he follow-up with his PCP to entertain the possibility of a stress test Refer to cardiology Other orders -     losartan (COZAAR) 100 MG tablet; Take 1 tablet (100 mg total) by mouth daily. Must have office visit for refills    No orders of the defined types were placed in this encounter.   Follow-up: No  follow-ups on file.    [name], NP

## 2019-07-03 ENCOUNTER — Ambulatory Visit: Payer: Self-pay | Admitting: Cardiology

## 2019-07-03 NOTE — Progress Notes (Deleted)
CARDIOLOGY CONSULT NOTE       Patient ID: [name] MRN: [id number] DOB/AGE: 06-Sep-1960 59 y.o.  Referring Physician: Oletta Lamas Primary Physician: Juluis Mire NP Primary Cardiologist: New Reason for Consultation: Chest pain    HPI:  59 y.o. referred by NP Edwards for chest pain Seen at [facility] 06/18/19 with atypical muscular pain. Duration 2 days sharp left sided pain. Radiated to shoulder and neck No pleuritic component No associated dyspnea, palpitations or syncope  CRF include HLD and HTN.  Previous smoker quit in March of 2000  He also has a history of ETOH abuse with pancreatitis He r/o ECG reported as nonspecific changes but not available in Epic and f/u ECG 06/30/19 from primary not readable when copied. He had a normal nuclear stress test in 2014   *** ROS All other systems reviewed and negative except as noted above  Past Medical History:  Diagnosis Date  . Alcohol abuse   . Anxiety   . Arthritis   . Diverticulosis of colon (without mention of hemorrhage)   . GERD (gastroesophageal reflux disease)   . History of kidney stones   . Hyperlipemia   . Hypertension    dr Ronalee Belts  norins  . Kidney stones   . Nephrolithiasis   . Pancreatitis    Hx of   . Ureteral stent displacement ([place]) 1990    Family History  Problem Relation Age of Onset  . Hypertension Father        X4 siblings  . Heart disease Father   . Diabetes Mother        x2 brothers    Social History   Socioeconomic History  . Marital status: Married    Spouse name: Not on file  . Number of children: 3  . Years of education: Not on file  . Highest education level: Not on file  Occupational History    Employer: [employer]    Comment: Grounds Keeper   Tobacco Use  . Smoking status: Former Smoker    Types: Cigarettes    Quit date: 07/10/1998    Years since quitting: 20.9  . Smokeless tobacco: Never Used  Substance and Sexual Activity  . Alcohol use: Yes    Alcohol/week:  6.0 standard drinks    Types: 6 Cans of beer per week    Comment: 1 6 pack a week  . Drug use: No  . Sexual activity: Yes  Other Topics Concern  . Not on file  Social History Narrative   HSG   [employer] groundskeeper   Difficult domestic situation, Married '87- separated '01   2 sons- '82, '91; 1 daughter '91   His niece is 60 with metastatic breast cancer (feb '12)   Social Determinants of Health   Financial Resource Strain:   . Difficulty of Paying Living Expenses: Not on file  Food Insecurity:   . Worried About Charity fundraiser in the Last Year: Not on file  . Ran Out of Food in the Last Year: Not on file  Transportation Needs:   . Lack of Transportation (Medical): Not on file  . Lack of Transportation (Non-Medical): Not on file  Physical Activity:   . Days of Exercise per Week: Not on file  . Minutes of Exercise per Session: Not on file  Stress:   . Feeling of Stress : Not on file  Social Connections:   . Frequency of Communication with Friends and Family: Not on file  .  Frequency of Social Gatherings with Friends and Family: Not on file  . Attends Religious Services: Not on file  . Active Member of Clubs or Organizations: Not on file  . Attends Archivist Meetings: Not on file  . Marital Status: Not on file  Intimate Partner Violence:   . Fear of Current or Ex-Partner: Not on file  . Emotionally Abused: Not on file  . Physically Abused: Not on file  . Sexually Abused: Not on file    Past Surgical History:  Procedure Laterality Date  . A2 pulley flexor sheath cyst 4th finger excisional biopsy     '03  . ANTERIOR CRUCIATE LIGAMENT REPAIR     R knee  . ESOPHAGOGASTRODUODENOSCOPY N/A 07/09/2012   Procedure: ESOPHAGOGASTRODUODENOSCOPY (EGD);  Surgeon: Inda Castle, MD;  Location: [facility];  Service: Endoscopy;  Laterality: N/A;  . KNEE SURGERY  2008  . SHOULDER ARTHROSCOPY WITH SUBACROMIAL DECOMPRESSION AND OPEN ROTATOR C Right 12/26/2012   Procedure:  RIGHT SHOULDER ARTHROSCOPY WITH SUBACROMIAL DECOMPRESSION MINI OPEN ROTATOR CUFF REPAIR, OPEN BICEP TENODESIS OPEN DISTAL CLAVICLE RESECTION  ;  Surgeon: Augustin Schooling, MD;  Location: [facility];  Service: Orthopedics;  Laterality: Right;  . TRANSFORAMINAL LUMBAR INTERBODY FUSION (TLIF) WITH PEDICLE SCREW FIXATION 1 LEVEL Right 03/27/2018   Procedure: RIGHT-SIDED LUMBAR 4-5 TRANSFORAMINAL LUMBAR INTERBODY FUSION WITH INSTRUMENTATION AND ALLOGRAFT;  Surgeon: Phylliss Bob, MD;  Location: [facility];  Service: Orthopedics;  Laterality: Right;  . URETEROSCOPY     and stone retreval      Current Outpatient Medications:  .  acetaminophen (TYLENOL) 500 MG tablet, Take 1,000 mg by mouth every 6 (six) hours as needed for mild pain or moderate pain., Disp: , Rfl:  .  amLODipine (NORVASC) 10 MG tablet, Take 1 tablet (10 mg total) by mouth daily. MUST MAKE APPT FOR FURTHER REFILLS, Disp: 90 tablet, Rfl: 1 .  carvedilol (COREG) 12.5 MG tablet, Take 1 tablet (12.5 mg total) by mouth 2 (two) times daily with a meal., Disp: 180 tablet, Rfl: 1 .  losartan (COZAAR) 100 MG tablet, Take 1 tablet (100 mg total) by mouth daily. Must have office visit for refills, Disp: 90 tablet, Rfl: 1 .  Multiple Vitamins-Minerals (MULTIVITAMIN WITH MINERALS) tablet, Take 1 tablet by mouth daily., Disp: , Rfl:  .  omeprazole (PRILOSEC) 20 MG capsule, TAKE 1 CAPSULE (20 MG TOTAL) BY MOUTH DAILY., Disp: 30 capsule, Rfl: 3 .  POTASSIUM CITRATE PO, Take by mouth., Disp: , Rfl:  .  pravastatin (PRAVACHOL) 40 MG tablet, Take 1 tablet (40 mg total) by mouth daily., Disp: 90 tablet, Rfl: 3    Physical Exam: There were no vitals taken for this visit.    Affect appropriate Healthy:  appears stated age 59: normal Neck supple with no adenopathy JVP normal no bruits no thyromegaly Lungs clear with no wheezing and good diaphragmatic motion Heart:  S1/S2 no murmur, no rub, gallop or click PMI normal Abdomen: benighn, BS positve, no  tenderness, no AAA no bruit.  No HSM or HJR Distal pulses intact with no bruits No edema Neuro non-focal Skin warm and dry No muscular weakness   Labs:   Lab Results  Component Value Date   WBC 10.3 04/29/2019   HGB 13.2 04/29/2019   HCT 37.5 04/29/2019   MCV 92 04/29/2019   PLT 264 04/29/2019   No results for input(s): NA, K, CL, CO2, BUN, CREATININE, CALCIUM, PROT, BILITOT, ALKPHOS, ALT, AST, GLUCOSE in the last 168 hours.  Invalid input(s): LABALBU No results found for: CKTOTAL, CKMB, CKMBINDEX, TROPONINI  Lab Results  Component Value Date   CHOL 212 (H) 04/29/2019   CHOL 196 01/24/2012   CHOL 219 (H) 06/08/2010   Lab Results  Component Value Date   HDL 66 04/29/2019   HDL 57.00 01/24/2012   HDL 67.70 06/08/2010   Lab Results  Component Value Date   LDLCALC 111 (H) 04/29/2019   Lab Results  Component Value Date   TRIG 205 (H) 04/29/2019   TRIG 302.0 (H) 01/24/2012   TRIG 186.0 (H) 06/08/2010   Lab Results  Component Value Date   CHOLHDL 3.2 04/29/2019   CHOLHDL 3 01/24/2012   CHOLHDL 3 06/08/2010   Lab Results  Component Value Date   LDLDIRECT 107.5 01/24/2012   LDLDIRECT 143.6 06/08/2010      Radiology: No results found.  EKG: ***   ASSESSMENT AND PLAN:   1. Chest pain atypical previously normal nuclear stress test 2014 Nonspecific ECG changes favor cardiac CTA for further risk stratification   2. HTN:  Well controlled.  Continue current medications and low sodium Dash type diet.    3. HLD:  Continue statin labs with primary  4. ETOH:  ***  Signed: Jenkins Rouge 07/03/2019, 1:05 PM

## 2019-07-07 ENCOUNTER — Ambulatory Visit: Payer: Self-pay | Admitting: Cardiovascular Disease

## 2019-07-14 ENCOUNTER — Encounter: Payer: Self-pay | Admitting: Primary Care

## 2019-07-16 MED FILL — AMLODIPINE BESYLATE 10 MG T: 10 | 30 days supply | Qty: 30 | Fill #4

## 2019-07-16 MED FILL — PRAVASTATIN NA 40 MG TAB: 40 | 30 days supply | Qty: 30 | Fill #2

## 2019-07-16 MED FILL — OMEPRAZOLE 20 MG CAP: 20 | 30 days supply | Qty: 30 | Fill #1

## 2019-07-16 MED FILL — CARVEDILOL 12.5 MG TABLET: 12.5 | 30 days supply | Qty: 60 | Fill #4

## 2019-07-16 MED FILL — LOSARTAN POTASSIUM 100 MG T: 100 | 30 days supply | Qty: 30 | Fill #4

## 2019-07-24 ENCOUNTER — Other Ambulatory Visit: Payer: Self-pay

## 2019-07-24 ENCOUNTER — Ambulatory Visit (INDEPENDENT_AMBULATORY_CARE_PROVIDER_SITE_OTHER): Payer: Self-pay | Admitting: Cardiovascular Disease

## 2019-07-24 ENCOUNTER — Encounter: Payer: Self-pay | Admitting: Cardiovascular Disease

## 2019-07-24 VITALS — BP 162/102 | HR 89 | Ht 72.0 in | Wt 246.4 lb

## 2019-07-24 DIAGNOSIS — I10 Essential (primary) hypertension: Secondary | ICD-10-CM

## 2019-07-24 DIAGNOSIS — E781 Pure hyperglyceridemia: Secondary | ICD-10-CM

## 2019-07-24 DIAGNOSIS — R079 Chest pain, unspecified: Secondary | ICD-10-CM

## 2019-07-24 DIAGNOSIS — R072 Precordial pain: Secondary | ICD-10-CM

## 2019-07-24 MED ORDER — METOPROLOL TARTRATE 50 MG PO TABS: ORAL_TABLET | ORAL | 0 refills | Status: DC

## 2019-07-24 MED ORDER — PREDNISONE 50 MG PO TABS: ORAL_TABLET | ORAL | 0 refills | Status: DC

## 2019-07-24 MED ORDER — CARVEDILOL 25 MG PO TABS: 25.0000 mg | ORAL_TABLET | Freq: Two times a day (BID) | ORAL | 3 refills | Status: DC

## 2019-07-24 MED FILL — METOPROLOL TARTRATE 50 MG T: 50 | 1 days supply | Qty: 1 | Fill #0

## 2019-07-24 MED FILL — predniSONE 10 MG TABS: 10 | 1 days supply | Qty: 15 | Fill #0

## 2019-07-24 MED FILL — CARVEDILOL 25 MG TABLET: 25 | 30 days supply | Qty: 60 | Fill #0

## 2019-07-24 NOTE — Assessment & Plan Note (Signed)
New onset chest pain over the last several weeks to somewhat atypical characteristics of both reflux and angina.  He also feels palpitations.  I am going to increase his carvedilol from 12.5 to 25 mg p.o. twice daily and obtain a coronary CTA to further evaluate.

## 2019-07-24 NOTE — Assessment & Plan Note (Signed)
History of essential hypertension a blood pressure measured today at 162/102.  He is on carvedilol and losartan as well as amlodipine.  I am going to increase his carvedilol from 12.5 to 25 mg p.o. twice daily.

## 2019-07-24 NOTE — Progress Notes (Signed)
07/24/2019 [name]   [birth year]  OR:[id number]  Primary Physician [name], NP Primary Cardiologist: Lorretta Harp MD Lupe Carney, [place]  HPI:  [name] is a 59 y.o. moderately overweight separated African-American male father of 72, grandfather of 3 grandchildren who is currently disabled from being a Manufacturing engineer at [employer] .  He had back issues and back surgery.  He is referred by Juluis Mire, NP for evaluation of chest pain.  His risk factors include remote tobacco abuse, treated hypertension and hyperlipidemia as well as family history with a brother who had stents.  He is never had a heart tach or stroke.  He does not admit to drinking excessive alcohol.  He was recently started on pravastatin by his PCP.  He has new onset chest pain over the last several weeks occurring several times a week with some characteristics of GERD and some atypical characteristics as well.  These are also associated with palpitations.   Current Meds  Medication Sig  . acetaminophen (TYLENOL) 500 MG tablet Take 1,000 mg by mouth every 6 (six) hours as needed for mild pain or moderate pain.  [name] amLODipine (NORVASC) 10 MG tablet Take 1 tablet (10 mg total) by mouth daily. MUST MAKE APPT FOR FURTHER REFILLS  . aspirin (ASPIRIN EC) 81 MG EC tablet Take 81 mg by mouth daily. Swallow whole.  . carvedilol (COREG) 25 MG tablet Take 1 tablet (25 mg total) by mouth 2 (two) times daily with a meal.  . losartan (COZAAR) 100 MG tablet Take 1 tablet (100 mg total) by mouth daily. Must have office visit for refills  . Multiple Vitamins-Minerals (MULTIVITAMIN WITH MINERALS) tablet Take 1 tablet by mouth daily.  [name] omeprazole (PRILOSEC) 20 MG capsule TAKE 1 CAPSULE (20 MG TOTAL) BY MOUTH DAILY.  [name] POTASSIUM CITRATE PO Take by mouth.  . pravastatin (PRAVACHOL) 40 MG tablet Take 1 tablet (40 mg total) by mouth daily.  . [DISCONTINUED] carvedilol (COREG) 12.5 MG tablet Take 1 tablet (12.5 mg  total) by mouth 2 (two) times daily with a meal.     Allergies  Allergen Reactions  . Hydrocodone-Acetaminophen Palpitations  . Shrimp [Shellfish Allergy] Other (See Comments)    Cholesterol level becomes very high.    Social History   Socioeconomic History  . Marital status: Married    Spouse name: Not on file  . Number of children: 3  . Years of education: Not on file  . Highest education level: Not on file  Occupational History    Employer: [employer]    Comment: Grounds Keeper   Tobacco Use  . Smoking status: Former Smoker    Types: Cigarettes    Quit date: 07/10/1998    Years since quitting: 21.0  . Smokeless tobacco: Never Used  Substance and Sexual Activity  . Alcohol use: Yes    Alcohol/week: 6.0 standard drinks    Types: 6 Cans of beer per week    Comment: 1 6 pack a week  . Drug use: No  . Sexual activity: Yes  Other Topics Concern  . Not on file  Social History Narrative   HSG   [employer] groundskeeper   Difficult domestic situation, Married '87- separated '01   2 sons- '82, '91; 1 daughter '91   His niece is 51 with metastatic breast cancer (feb '12)   Social Determinants of Health   Financial Resource Strain:   . Difficulty  of Paying Living Expenses:   Food Insecurity:   . Worried About Charity fundraiser in the Last Year:   . Arboriculturist in the Last Year:   Transportation Needs:   . Film/video editor (Medical):   [name] Lack of Transportation (Non-Medical):   Physical Activity:   . Days of Exercise per Week:   . Minutes of Exercise per Session:   Stress:   . Feeling of Stress :   Social Connections:   . Frequency of Communication with Friends and Family:   . Frequency of Social Gatherings with Friends and Family:   . Attends Religious Services:   . Active Member of Clubs or Organizations:   . Attends Archivist Meetings:   [name] Marital Status:   Intimate Partner Violence:   . Fear of Current or Ex-Partner:   . Emotionally  Abused:   [name] Physically Abused:   . Sexually Abused:      Review of Systems: General: negative for chills, fever, night sweats or weight changes.  Cardiovascular: negative for chest pain, dyspnea on exertion, edema, orthopnea, palpitations, paroxysmal nocturnal dyspnea or shortness of breath Dermatological: negative for rash Respiratory: negative for cough or wheezing Urologic: negative for hematuria Abdominal: negative for nausea, vomiting, diarrhea, bright red blood per rectum, melena, or hematemesis Neurologic: negative for visual changes, syncope, or dizziness All other systems reviewed and are otherwise negative except as noted above.    Blood pressure (!) 162/102, pulse 89, height 6' (1.829 m), weight 246 lb 6.4 oz (111.8 kg), SpO2 97 %.  General appearance: alert and no distress Neck: no adenopathy, no carotid bruit, no JVD, supple, symmetrical, trachea midline and thyroid not enlarged, symmetric, no tenderness/mass/nodules Lungs: clear to auscultation bilaterally Heart: regular rate and rhythm, S1, S2 normal, no murmur, click, rub or gallop Extremities: extremities normal, atraumatic, no cyanosis or edema Pulses: 2+ and symmetric Skin: Skin color, texture, turgor normal. No rashes or lesions Neurologic: Alert and oriented X 3, normal strength and tone. Normal symmetric reflexes. Normal coordination and gait  EKG sinus rhythm at 89 without ST or T wave changes.  I personally reviewed this EKG.  ASSESSMENT AND PLAN:   Essential hypertension History of essential hypertension a blood pressure measured today at 162/102.  He is on carvedilol and losartan as well as amlodipine.  I am going to increase his carvedilol from 12.5 to 25 mg p.o. twice daily.  Hypertriglyceridemia History of hypertriglyceridemia on Pravachol with lipid profile performed 04/29/2019 revealing total cholesterol 212 with triglyceride level of 205.  Chest pain of uncertain etiology New onset chest pain over  the last several weeks to somewhat atypical characteristics of both reflux and angina.  He also feels palpitations.  I am going to increase his carvedilol from 12.5 to 25 mg p.o. twice daily and obtain a coronary CTA to further evaluate.      Lorretta Harp MD FACP,FACC,FAHA, [facility] 07/24/2019 3:32 PM

## 2019-07-24 NOTE — Patient Instructions (Signed)
Medication Instructions:  INCREASE CARVEDILOL TO 25 MG TWICE DAILY= 2 OF THE 12.5 MG TABLETS TWICE DAILY  *If you need a refill on your cardiac medications before your next appointment, please call your pharmacy*   Lab Work: If you have labs (blood work) drawn today and your tests are completely normal, you will receive your results only by: [name] MyChart Message (if you have MyChart) OR . A paper copy in the mail If you have any lab test that is abnormal or we need to change your treatment, we will call you to review the results.   Testing/Procedures: Your cardiac CT will be scheduled at one of the below locations:   [facility] [address] [place], [place] [address] [phone]  [place] [address] [place], [place] [address] [phone]  If scheduled at [facility], please arrive at the [facility] main entrance of [facility] 30 minutes prior to test start time. Proceed to the [facility] Radiology Department (first floor) to check-in and test prep.  If scheduled at [facility], please arrive 15 mins early for check-in and test prep.  Please follow these instructions carefully (unless otherwise directed):  Hold all erectile dysfunction medications at least 3 days (72 hrs) prior to test.  On the Night Before the Test: . Be sure to Drink plenty of water. . Do not consume any caffeinated/decaffeinated beverages or chocolate 12 hours prior to your test. . Do not take any antihistamines 12 hours prior to your test. . If you take Metformin do not take 24 hours prior to test. . If the patient has contrast allergy: ? Patient will need a prescription for Prednisone and very clear instructions (as follows): 1. Prednisone 50 mg - take 13 hours prior to test 2. Take another Prednisone 50 mg 7 hours prior to test 3. Take another Prednisone 50 mg 1 hour prior to  test 4. Take Benadryl 50 mg 1 hour prior to test . Patient must complete all four doses of above prophylactic medications. . Patient will need a ride after test due to Benadryl.  On the Day of the Test: . Drink plenty of water. Do not drink any water within one hour of the test. . Do not eat any food 4 hours prior to the test. . You may take your regular medications prior to the test.  . Take metoprolol 50 MG (Lopressor) two hours prior to test. . HOLD Furosemide/Hydrochlorothiazide morning of the test. . FEMALES- please wear underwire-free bra if available       After the Test: . Drink plenty of water. . After receiving IV contrast, you may experience a mild flushed feeling. This is normal. . On occasion, you may experience a mild rash up to 24 hours after the test. This is not dangerous. If this occurs, you can take Benadryl 25 mg and increase your fluid intake. . If you experience trouble breathing, this can be serious. If it is severe call 911 IMMEDIATELY. If it is mild, please call our office. . If you take any of these medications: Glipizide/Metformin, Avandament, Glucavance, please do not take 48 hours after completing test unless otherwise instructed.   Once we have confirmed authorization from your insurance company, we will call you to set up a date and time for your test.   For non-scheduling related questions, please contact the cardiac imaging nurse navigator should you have any questions/concerns: Marchia Bond,  RN Navigator Cardiac Imaging [facility] [phone] office  For scheduling needs, including cancellations and rescheduling, please call [phone].      Follow-Up: At [facility], you and your health needs are our priority.  As part of our continuing mission to provide you with exceptional heart care, we have created designated Provider Care Teams.  These Care Teams include your primary Cardiologist (physician) and Advanced  Practice Providers (APPs -  Physician Assistants and Nurse Practitioners) who all work together to provide you with the care you need, when you need it.  We recommend signing up for the patient portal called "MyChart".  Sign up information is provided on this After Visit Summary.  MyChart is used to connect with patients for Virtual Visits (Telemedicine).  Patients are able to view lab/test results, encounter notes, upcoming appointments, etc.  Non-urgent messages can be sent to your provider as well.   To learn more about what you can do with MyChart, go to [email or web address].    Your next appointment:   1 month(s)  The format for your next appointment:   In Person  Provider:   Quay Burow, MD

## 2019-07-24 NOTE — Assessment & Plan Note (Signed)
History of hypertriglyceridemia on Pravachol with lipid profile performed 04/29/2019 revealing total cholesterol 212 with triglyceride level of 205.

## 2019-07-27 ENCOUNTER — Telehealth: Payer: Self-pay | Admitting: Cardiovascular Disease

## 2019-07-27 ENCOUNTER — Telehealth (HOSPITAL_COMMUNITY): Payer: Self-pay | Admitting: Emergency Medicine

## 2019-07-27 NOTE — Telephone Encounter (Signed)
Error

## 2019-07-27 NOTE — Telephone Encounter (Signed)
Spoke to patient Dr.Berry's advice given.Advised to hold coreg in am and take metoprolol 50 mg 2 hours before coronary ct.Advised ok to take other B/P meds as prescribed.

## 2019-07-27 NOTE — Telephone Encounter (Signed)
Spoke to patient he stated he fell asleep last night and forgot to take Carvedilol 25 mg.Stated when he woke up at 3:00 am this morning he took 25mg .Stated he wanted to know when to take the next dose.Advised to take 25 mg this afternoon at his normal time.Stated he is suppose to take Metoprolol 50 mg 2 hours before Coronary CT tomorrow.Advised I will send message to Dr.Berry to see if you are to take Metoprolol or Coreg 2 hours before Coronary CT.

## 2019-07-27 NOTE — Telephone Encounter (Signed)
Pt c/o medication issue:  1. Name of Medication: carvedilol (COREG) 25 MG tablet  2. How are you currently taking this medication (dosage and times per day)? one tablet at night and one in the morning  3. Are you having a reaction (difficulty breathing--STAT)? no  4. What is your medication issue? Patient states he took one tablet at 3am this morning would like to know how long before he should take his second tablet. Please advise.

## 2019-07-27 NOTE — Telephone Encounter (Signed)
Calling to clarify with the patient that prednisone and benadryl are not necessary for shellfish allergy as it relates to diagnostic contrast media.  Pt verbalized understanding that he will still need to pick up his one time dose of metoprolol from pharm and take 2 hr prior to procedure.  Will be in touch with patient closer to CCTA appt date/time.  Marchia Bond RN Navigator Cardiac Imaging [facility] [phone] Office  [phone] Cell

## 2019-07-27 NOTE — Telephone Encounter (Signed)
OK to take the metop as directed

## 2019-07-28 ENCOUNTER — Ambulatory Visit (INDEPENDENT_AMBULATORY_CARE_PROVIDER_SITE_OTHER): Payer: Self-pay | Admitting: Primary Care

## 2019-07-28 ENCOUNTER — Ambulatory Visit (HOSPITAL_COMMUNITY)
Admission: RE | Admit: 2019-07-28 | Discharge: 2019-07-28 | Disposition: A | Discharge status: Home / Self Care | Payer: Self-pay | Source: Ambulatory Visit | Attending: Cardiovascular Disease | Admitting: Cardiovascular Disease

## 2019-07-28 ENCOUNTER — Encounter (INDEPENDENT_AMBULATORY_CARE_PROVIDER_SITE_OTHER): Payer: Self-pay | Admitting: Primary Care

## 2019-07-28 ENCOUNTER — Other Ambulatory Visit: Payer: Self-pay

## 2019-07-28 DIAGNOSIS — R072 Precordial pain: Secondary | ICD-10-CM | POA: Insufficient documentation

## 2019-07-28 DIAGNOSIS — I10 Essential (primary) hypertension: Secondary | ICD-10-CM | POA: Insufficient documentation

## 2019-07-28 MED ORDER — METOPROLOL TARTRATE 5 MG/5ML IV SOLN: 5.0000 mg | INTRAVENOUS | Status: DC | PRN

## 2019-07-28 MED ORDER — NITROGLYCERIN 0.4 MG SL SUBL: 0.8000 mg | SUBLINGUAL_TABLET | Freq: Once | SUBLINGUAL | Status: DC

## 2019-07-28 MED ORDER — METOPROLOL TARTRATE 5 MG/5ML IV SOLN
INTRAVENOUS | Status: AC
  Filled 2019-07-28: qty 10

## 2019-07-28 MED ORDER — IOHEXOL 350 MG/ML SOLN
80.0000 mL | Freq: Once | INTRAVENOUS | Status: AC | PRN
  Administered 2019-07-28: 80 mL via INTRAVENOUS

## 2019-07-28 MED ORDER — NITROGLYCERIN 0.4 MG SL SUBL
SUBLINGUAL_TABLET | SUBLINGUAL | Status: AC
  Filled 2019-07-28: qty 2

## 2019-07-28 NOTE — Progress Notes (Signed)
Virtual Visit via Telephone Note  I connected with [name] on 07/28/19 at  3:10 PM EDT by telephone and verified that I am speaking with the correct person using two identifiers.   I discussed the limitations, risks, security and privacy concerns of performing an evaluation and management service by telephone and the availability of in person appointments. I also discussed with the patient that there may be a patient responsible charge related to this service. The patient expressed understanding and agreed to proceed.   History of Present Illness: [name] is having a tele visit for blood pressure follow up systolic [id number] and diastolic ranges from 0000000. He is followed by Dr. Gwenlyn Found will defer all hypertension changes to cardiologist.  Patient was informing that he had a procedure today needed a pill under his tongue and coreg was increased from 12.5 to 25mg  twice daily. Past Medical History:  Diagnosis Date  . Alcohol abuse   . Anxiety   . Arthritis   . Diverticulosis of colon (without mention of hemorrhage)   . GERD (gastroesophageal reflux disease)   . History of kidney stones   . Hyperlipemia   . Hypertension    dr Ronalee Belts  norins  . Kidney stones   . Nephrolithiasis   . Pancreatitis    Hx of   . Ureteral stent displacement ([place]) 1990   Current Outpatient Medications on File Prior to Visit  Medication Sig Dispense Refill  . acetaminophen (TYLENOL) 500 MG tablet Take 1,000 mg by mouth every 6 (six) hours as needed for mild pain or moderate pain.    [name] amLODipine (NORVASC) 10 MG tablet Take 1 tablet (10 mg total) by mouth daily. MUST MAKE APPT FOR FURTHER REFILLS 90 tablet 1  . aspirin (ASPIRIN EC) 81 MG EC tablet Take 81 mg by mouth daily. Swallow whole.    . carvedilol (COREG) 25 MG tablet Take 1 tablet (25 mg total) by mouth 2 (two) times daily with a meal. 180 tablet 3  . losartan (COZAAR) 100 MG tablet Take 1 tablet (100 mg total) by mouth daily. Must have office visit  for refills 90 tablet 1  . metoprolol tartrate (LOPRESSOR) 50 MG tablet TAKE 2 HOURS PRIOR TO CT SCAN 1 tablet 0  . Multiple Vitamins-Minerals (MULTIVITAMIN WITH MINERALS) tablet Take 1 tablet by mouth daily.    [name] omeprazole (PRILOSEC) 20 MG capsule TAKE 1 CAPSULE (20 MG TOTAL) BY MOUTH DAILY. 30 capsule 3  . POTASSIUM CITRATE PO Take by mouth.    . pravastatin (PRAVACHOL) 40 MG tablet Take 1 tablet (40 mg total) by mouth daily. 90 tablet 3   No current facility-administered medications on file prior to visit.    Observations/Objective: Review of Systems  All other systems reviewed and are negative.   Assessment and Plan: [name] was seen today for blood pressure check.  Diagnoses and all orders for this visit:  Essential hypertension Managed by cardiology procedure competed to day feeling better.   Follow Up Instructions:    I discussed the assessment and treatment plan with the patient. The patient was provided an opportunity to ask questions and all were answered. The patient agreed with the plan and demonstrated an understanding of the instructions.   The patient was advised to call back or seek an in-person evaluation if the symptoms worsen or if the condition fails to improve as anticipated.  I provided 8 minutes of non-face-to-face time during this encounter.   [name], NP

## 2019-07-28 NOTE — Progress Notes (Signed)
Bp at time of Ct scan 15?/90  Pt has not checked Bp himself today  Pt has taken antihypertensives  Had pt relax and check Bp. Reading at 3:11 was 135/90

## 2019-07-29 ENCOUNTER — Ambulatory Visit (HOSPITAL_COMMUNITY)
Admission: RE | Admit: 2019-07-29 | Discharge: 2019-07-29 | Disposition: A | Discharge status: Home / Self Care | Payer: Self-pay | Source: Ambulatory Visit | Attending: Cardiovascular Disease | Admitting: Cardiovascular Disease

## 2019-07-29 ENCOUNTER — Telehealth: Payer: Self-pay | Admitting: Cardiovascular Disease

## 2019-07-29 DIAGNOSIS — R072 Precordial pain: Secondary | ICD-10-CM

## 2019-07-29 DIAGNOSIS — I10 Essential (primary) hypertension: Secondary | ICD-10-CM

## 2019-07-29 NOTE — Telephone Encounter (Signed)
Spoke to patient appointment scheduled with Dr.Berry Friday 07/31/19 at 11:30 am to discuss coronary ct.

## 2019-07-29 NOTE — Telephone Encounter (Signed)
  Pt is returning call regarding CT results. Advised Dr. Gwenlyn Found will discuss it on his next available appt. Pt said he would rather discuss it with the nurse who's calling him today, he said to call him on his mobile or home phone number.  Please call

## 2019-07-31 ENCOUNTER — Telehealth: Payer: Self-pay | Admitting: Cardiovascular Disease

## 2019-07-31 ENCOUNTER — Encounter: Payer: Self-pay | Admitting: Cardiovascular Disease

## 2019-07-31 ENCOUNTER — Other Ambulatory Visit: Payer: Self-pay

## 2019-07-31 ENCOUNTER — Ambulatory Visit (INDEPENDENT_AMBULATORY_CARE_PROVIDER_SITE_OTHER): Payer: Self-pay | Admitting: Cardiovascular Disease

## 2019-07-31 VITALS — BP 142/92 | HR 74 | Ht 72.0 in | Wt 248.4 lb

## 2019-07-31 DIAGNOSIS — Z01812 Encounter for preprocedural laboratory examination: Secondary | ICD-10-CM

## 2019-07-31 DIAGNOSIS — R079 Chest pain, unspecified: Secondary | ICD-10-CM

## 2019-07-31 NOTE — Telephone Encounter (Signed)
Patient states he is requesting to speak with a nurse for clarification in regards to heart cath scheduled for 08/10/19. Please return call to discuss.

## 2019-07-31 NOTE — Patient Instructions (Signed)
Medication Instructions:  Your Physician recommend you continue on your current medication as directed.    *If you need a refill on your cardiac medications before your next appointment, please call your pharmacy*   Lab Work: Your physician recommends that you return for lab work today (CBC, BMP)  If you have labs (blood work) drawn today and your tests are completely normal, you will receive your results only by: [name] MyChart Message (if you have MyChart) OR . A paper copy in the mail If you have any lab test that is abnormal or we need to change your treatment, we will call you to review the results.   Testing/Procedures: Your physician has requested that you have a cardiac catheterization. Cardiac catheterization is used to diagnose and/or treat various heart conditions. Doctors may recommend this procedure for a number of different reasons. The most common reason is to evaluate chest pain. Chest pain can be a symptom of coronary artery disease (CAD), and cardiac catheterization can show whether plaque is narrowing or blocking your heart's arteries. This procedure is also used to evaluate the valves, as well as measure the blood flow and oxygen levels in different parts of your heart. For further information please visit [email or web address]. Please follow instruction sheet, as given. [place] will need to have the coronavirus test completed prior to your procedure. An appointment has been made at  1:10 pm on April, 1. This is a Drive Up Visit at the [facility] [address] Someone will direct you to the appropriate testing line. Please tell them that you are there for procedure testing. Stay in your car and someone will be with you shortly. Please make sure to have all other labs completed before this test because you will need to stay quarantined until your procedure.     Follow-Up: At [facility], you and your health needs are our priority.  As part of  our continuing mission to provide you with exceptional heart care, we have created designated Provider Care Teams.  These Care Teams include your primary Cardiologist (physician) and Advanced Practice Providers (APPs -  Physician Assistants and Nurse Practitioners) who all work together to provide you with the care you need, when you need it.  We recommend signing up for the patient portal called "MyChart".  Sign up information is provided on this After Visit Summary.  MyChart is used to connect with patients for Virtual Visits (Telemedicine).  Patients are able to view lab/test results, encounter notes, upcoming appointments, etc.  Non-urgent messages can be sent to your provider as well.   To learn more about what you can do with MyChart, go to [email or web address].    Your next appointment:   4 week(s)  The format for your next appointment:   In Person  Provider:   Quay Burow, MD     [place] [address] Dept: [phone] Loc: [place]  07/31/2019  You are scheduled for a Cardiac Catheterization on Monday, April 5 with Dr. Quay Burow.  1. Please arrive at the [facility] (Main Entrance A) at [facility]: [address] [place], [place] [address] at 7:30 AM (This time is two hours before your procedure to ensure your preparation). Free valet parking service is available.   Special note: Every effort is made to have your procedure done on time. Please understand that emergencies sometimes  delay scheduled procedures.  2. Diet: Do not eat solid foods after midnight.  The patient may have clear liquids until 5am upon the day of the procedure.  3. Labs: You will need to have blood drawn today (CBC, BMP)  4. Medication instructions in preparation for your procedure:   Contrast Allergy: No   On the morning of your procedure, take  your Aspirin and any morning medicines NOT listed above.  You may use sips of water.  5. Plan for one night stay--bring personal belongings. 6. Bring a current list of your medications and current insurance cards. 7. You MUST have a responsible person to drive you home. 8. Someone MUST be with you the first 24 hours after you arrive home or your discharge will be delayed. 9. Please wear clothes that are easy to get on and off and wear slip-on shoes.  Thank you for allowing [place] to care for you!   -- [facility]

## 2019-07-31 NOTE — Progress Notes (Signed)
[name] returns today for follow-up of his coronary CTA performed 2 days ago that suggested physiologically significant lesion in his proximal LAD.  Based on this, we decided to proceed with outpatient diagnostic coronary angiography via the right radial approach on 08/10/2019. The patient understands that risks included but are not limited to stroke (1 in 1000), death (1 in 68), kidney failure [usually temporary] (1 in 500), bleeding (1 in 200), allergic reaction [possibly serious] (1 in 200). The patient understands and agrees to proceed  Lorretta Harp, M.D., [place], [facility], [place], [place] [address] [place], [place]  [address]  [phone] 07/31/2019 12:26 PM

## 2019-07-31 NOTE — Telephone Encounter (Signed)
Pt called for clarification in regards to cath procedure. Nurse re-explained procedure. Pt verbalized understanding.

## 2019-07-31 NOTE — H&P (View-Only) (Signed)
Mr. Lanoux returns today for follow-up of his coronary CTA performed 2 days ago that suggested physiologically significant lesion in his proximal LAD.  Based on this, we decided to proceed with outpatient diagnostic coronary angiography via the right radial approach on 08/10/2019. The patient understands that risks included but are not limited to stroke (1 in 1000), death (1 in 34), kidney failure [usually temporary] (1 in 500), bleeding (1 in 200), allergic reaction [possibly serious] (1 in 200). The patient understands and agrees to proceed  Lorretta Harp, M.D., [place], [facility], [place], [place] [address] [place],   [address]  [phone] 07/31/2019 12:26 PM

## 2019-08-01 LAB — BASIC METABOLIC PANEL WITH GFR
BUN/Creatinine Ratio: 15 (ref 9–20)
BUN: 17 mg/dL (ref 6–24)
CO2: 21 mmol/L (ref 20–29)
Calcium: 9.3 mg/dL (ref 8.7–10.2)
Chloride: 103 mmol/L (ref 96–106)
Creatinine, Ser: 1.12 mg/dL (ref 0.76–1.27)
GFR calc Af Amer: 84 mL/min/1.73
GFR calc non Af Amer: 73 mL/min/1.73
Glucose: 113 mg/dL — ABNORMAL HIGH (ref 65–99)
Potassium: 4.1 mmol/L (ref 3.5–5.2)
Sodium: 139 mmol/L (ref 134–144)

## 2019-08-01 LAB — CBC
Hematocrit: 37.5 % (ref 37.5–51.0)
Hemoglobin: 12.9 g/dL — ABNORMAL LOW (ref 13.0–17.7)
MCH: 31.6 pg (ref 26.6–33.0)
MCHC: 34.4 g/dL (ref 31.5–35.7)
MCV: 92 fL (ref 79–97)
Platelets: 273 10*3/uL (ref 150–450)
RBC: 4.08 x10E6/uL — ABNORMAL LOW (ref 4.14–5.80)
RDW: 12.3 % (ref 11.6–15.4)
WBC: 9.8 10*3/uL (ref 3.4–10.8)

## 2019-08-06 ENCOUNTER — Telehealth: Payer: Self-pay | Admitting: *Deleted

## 2019-08-06 ENCOUNTER — Inpatient Hospital Stay (HOSPITAL_COMMUNITY)
Admission: RE | Admit: 2019-08-06 | Discharge: 2019-08-06 | Disposition: A | Discharge status: Home / Self Care | Payer: Self-pay | Source: Ambulatory Visit

## 2019-08-06 NOTE — Progress Notes (Signed)
Pt states that he tested positive for COVD-19 mid January of 2021. Pt states that he has the paperwork to prove that he was positive. Pt will not be tested today because he has been positive in the last 90-days. Pt will bring a copy of the positive results to the hospital on day of procedure. Pt verbalizes understanding.   Jacqlyn Larsen, RN

## 2019-08-06 NOTE — Telephone Encounter (Signed)
Pt contacted pre-catheterization scheduled at [facility] for: Monday August 10, 2019 9:30 AM Verified arrival time and place: [facility]) at: 7:30 AM   No solid food after midnight prior to cath, clear liquids until 5 AM day of procedure.   AM meds can be  taken pre-cath with sip of water including: ASA 81 mg   Confirmed patient has responsible adult to drive home post procedure and observe 24 hours after arriving home: yes  Currently, due to Covid-19 pandemic, only one person will be allowed with patient. Must be the same person for patient's entire stay and will be required to wear a mask. They will be asked to wait in the waiting room for the duration of the patient's stay.  Patients are required to wear a mask when they enter the hospital.      COVID-19 Pre-Screening Questions:  . In the past 7 to 10 days have you had a cough,  shortness of breath, headache, congestion, fever (100 or greater) body aches, chills, sore throat, or sudden loss of taste or sense of smell? no . Have you been around anyone with known Covid 19 in the past 7-10 days? No . Have you been around anyone who has mentioned symptoms of Covid 19 within the past 7 to 10 days? No   Reviewed procedure/mask/visitor instructions, COVID-19 screening questions with patient.

## 2019-08-07 ENCOUNTER — Telehealth: Payer: Self-pay | Admitting: Cardiovascular Disease

## 2019-08-07 NOTE — Telephone Encounter (Signed)
New message  Patient is calling in to speak with Dr. Gwenlyn Found or his nurse. Patient states that he was not tested for COVID on yesterday 08/06/19. States that he didn't bring the paper with him to the test site as well as he was tested positive for COVID 19 on 01/18, 2021. Patient wants to know what needs to happen now. Please give patient a call back to assist.

## 2019-08-07 NOTE — Telephone Encounter (Signed)
Contacted patient- he states that he tested positive back in January- and when he went yesterday he states they would not test him again. I did call over to the [place] and get clarification, she states that if they are within the 90 days, if they were to test again he could still show positive, so they just require that he bring in his documentation that he had it within the 90 days, and they will still do the procedure. I called patient back and advised of this, he will continue his plan for Monday.   Patient verbalized understanding.

## 2019-08-10 ENCOUNTER — Encounter (HOSPITAL_COMMUNITY)
Admission: RE | Disposition: A | Discharge status: Home / Self Care | Payer: Self-pay | Source: Home / Self Care | Attending: Cardiovascular Disease

## 2019-08-10 ENCOUNTER — Other Ambulatory Visit: Payer: Self-pay

## 2019-08-10 ENCOUNTER — Ambulatory Visit (HOSPITAL_COMMUNITY)
Admission: RE | Admit: 2019-08-10 | Discharge: 2019-08-10 | Disposition: A | Discharge status: Home / Self Care | Payer: Self-pay | Attending: Cardiovascular Disease | Admitting: Cardiovascular Disease

## 2019-08-10 ENCOUNTER — Encounter (HOSPITAL_COMMUNITY): Payer: Self-pay | Admitting: Cardiovascular Disease

## 2019-08-10 DIAGNOSIS — Z7982 Long term (current) use of aspirin: Secondary | ICD-10-CM | POA: Insufficient documentation

## 2019-08-10 DIAGNOSIS — E781 Pure hyperglyceridemia: Secondary | ICD-10-CM | POA: Insufficient documentation

## 2019-08-10 DIAGNOSIS — R079 Chest pain, unspecified: Secondary | ICD-10-CM | POA: Diagnosis present

## 2019-08-10 DIAGNOSIS — I2510 Atherosclerotic heart disease of native coronary artery without angina pectoris: Secondary | ICD-10-CM | POA: Insufficient documentation

## 2019-08-10 DIAGNOSIS — Z01812 Encounter for preprocedural laboratory examination: Secondary | ICD-10-CM

## 2019-08-10 DIAGNOSIS — Z79899 Other long term (current) drug therapy: Secondary | ICD-10-CM | POA: Insufficient documentation

## 2019-08-10 DIAGNOSIS — Z87891 Personal history of nicotine dependence: Secondary | ICD-10-CM | POA: Insufficient documentation

## 2019-08-10 DIAGNOSIS — I10 Essential (primary) hypertension: Secondary | ICD-10-CM | POA: Insufficient documentation

## 2019-08-10 HISTORY — PX: LEFT HEART CATH AND CORONARY ANGIOGRAPHY: CATH118249

## 2019-08-10 SURGERY — LEFT HEART CATH AND CORONARY ANGIOGRAPHY
Anesthesia: LOCAL

## 2019-08-10 MED ORDER — METHYLPREDNISOLONE SODIUM SUCC 125 MG IJ SOLR
INTRAMUSCULAR | Status: DC | PRN
  Administered 2019-08-10: 125 mg via INTRAVENOUS

## 2019-08-10 MED ORDER — SODIUM CHLORIDE 0.9 % IV SOLN: 250.0000 mL | INTRAVENOUS | Status: DC | PRN

## 2019-08-10 MED ORDER — SODIUM CHLORIDE 0.9 % WEIGHT BASED INFUSION
3.0000 mL/kg/h | INTRAVENOUS | Status: AC
  Administered 2019-08-10: 3 mL/kg/h via INTRAVENOUS

## 2019-08-10 MED ORDER — NITROGLYCERIN 1 MG/10 ML FOR IR/CATH LAB
INTRA_ARTERIAL | Status: AC
  Filled 2019-08-10: qty 10

## 2019-08-10 MED ORDER — DIPHENHYDRAMINE HCL 50 MG/ML IJ SOLN
INTRAMUSCULAR | Status: AC
  Filled 2019-08-10: qty 1

## 2019-08-10 MED ORDER — HEPARIN SODIUM (PORCINE) 1000 UNIT/ML IJ SOLN
INTRAMUSCULAR | Status: AC
  Filled 2019-08-10: qty 1

## 2019-08-10 MED ORDER — MIDAZOLAM HCL 2 MG/2ML IJ SOLN
INTRAMUSCULAR | Status: DC | PRN
  Administered 2019-08-10: 1 mg via INTRAVENOUS

## 2019-08-10 MED ORDER — SODIUM CHLORIDE 0.9% FLUSH: 3.0000 mL | INTRAVENOUS | Status: DC | PRN

## 2019-08-10 MED ORDER — FENTANYL CITRATE (PF) 100 MCG/2ML IJ SOLN
INTRAMUSCULAR | Status: DC | PRN
  Administered 2019-08-10: 25 ug via INTRAVENOUS

## 2019-08-10 MED ORDER — LIDOCAINE HCL (PF) 1 % IJ SOLN
INTRAMUSCULAR | Status: DC | PRN
  Administered 2019-08-10: 2 mL

## 2019-08-10 MED ORDER — HYDRALAZINE HCL 20 MG/ML IJ SOLN: 10.0000 mg | INTRAMUSCULAR | Status: DC | PRN

## 2019-08-10 MED ORDER — SODIUM CHLORIDE 0.9 % WEIGHT BASED INFUSION: 1.0000 mL/kg/h | INTRAVENOUS | Status: DC

## 2019-08-10 MED ORDER — ASPIRIN 81 MG PO CHEW: 81.0000 mg | CHEWABLE_TABLET | Freq: Every day | ORAL | Status: DC

## 2019-08-10 MED ORDER — DIPHENHYDRAMINE HCL 50 MG/ML IJ SOLN
INTRAMUSCULAR | Status: DC | PRN
  Administered 2019-08-10: 50 mg via INTRAVENOUS

## 2019-08-10 MED ORDER — MIDAZOLAM HCL 2 MG/2ML IJ SOLN
INTRAMUSCULAR | Status: AC
  Filled 2019-08-10: qty 2

## 2019-08-10 MED ORDER — SODIUM CHLORIDE 0.9 % IV SOLN: INTRAVENOUS | Status: DC

## 2019-08-10 MED ORDER — HEPARIN SODIUM (PORCINE) 1000 UNIT/ML IJ SOLN
INTRAMUSCULAR | Status: DC | PRN
  Administered 2019-08-10: 5000 [IU] via INTRAVENOUS

## 2019-08-10 MED ORDER — ACETAMINOPHEN 325 MG PO TABS: 650.0000 mg | ORAL_TABLET | ORAL | Status: DC | PRN

## 2019-08-10 MED ORDER — ONDANSETRON HCL 4 MG/2ML IJ SOLN: 4.0000 mg | Freq: Four times a day (QID) | INTRAMUSCULAR | Status: DC | PRN

## 2019-08-10 MED ORDER — VERAPAMIL HCL 2.5 MG/ML IV SOLN
INTRAVENOUS | Status: AC
  Filled 2019-08-10: qty 2

## 2019-08-10 MED ORDER — IOHEXOL 350 MG/ML SOLN
INTRAVENOUS | Status: DC | PRN
  Administered 2019-08-10: 11:00:00 50 mL

## 2019-08-10 MED ORDER — SODIUM CHLORIDE 0.9% FLUSH: 3.0000 mL | Freq: Two times a day (BID) | INTRAVENOUS | Status: DC

## 2019-08-10 MED ORDER — HEPARIN (PORCINE) IN NACL 1000-0.9 UT/500ML-% IV SOLN
INTRAVENOUS | Status: AC
  Filled 2019-08-10: qty 500

## 2019-08-10 MED ORDER — METHYLPREDNISOLONE SODIUM SUCC 125 MG IJ SOLR
INTRAMUSCULAR | Status: AC
  Filled 2019-08-10: qty 2

## 2019-08-10 MED ORDER — VERAPAMIL HCL 2.5 MG/ML IV SOLN
INTRA_ARTERIAL | Status: DC | PRN
  Administered 2019-08-10: 10:00:00 10 mL via INTRA_ARTERIAL

## 2019-08-10 MED ORDER — FENTANYL CITRATE (PF) 100 MCG/2ML IJ SOLN
INTRAMUSCULAR | Status: AC
  Filled 2019-08-10: qty 2

## 2019-08-10 MED ORDER — HEPARIN (PORCINE) IN NACL 1000-0.9 UT/500ML-% IV SOLN
INTRAVENOUS | Status: DC | PRN
  Administered 2019-08-10 (×2): 500 mL

## 2019-08-10 MED ORDER — LABETALOL HCL 5 MG/ML IV SOLN: 10.0000 mg | INTRAVENOUS | Status: DC | PRN

## 2019-08-10 MED ORDER — LIDOCAINE HCL (PF) 1 % IJ SOLN
INTRAMUSCULAR | Status: AC
  Filled 2019-08-10: qty 30

## 2019-08-10 SURGICAL SUPPLY — 11 items
CATH INFINITI 5 FR JL3.5 (CATHETERS) ×1 IMPLANT
CATH OPTITORQUE TIG 4.0 5F (CATHETERS) ×1 IMPLANT
DEVICE RAD COMP TR BAND LRG (VASCULAR PRODUCTS) ×2 IMPLANT
GLIDESHEATH SLEND A-KIT 6F 22G (SHEATH) ×1 IMPLANT
GUIDEWIRE INQWIRE 1.5J.035X260 (WIRE) IMPLANT
INQWIRE 1.5J .035X260CM (WIRE) ×2
KIT HEART LEFT (KITS) ×2 IMPLANT
PACK CARDIAC CATHETERIZATION (CUSTOM PROCEDURE TRAY) ×2 IMPLANT
TRANSDUCER W/STOPCOCK (MISCELLANEOUS) ×2 IMPLANT
TUBING CIL FLEX 10 FLL-RA (TUBING) ×2 IMPLANT
WIRE HI TORQ VERSACORE-J 145CM (WIRE) ×1 IMPLANT

## 2019-08-10 NOTE — Discharge Instructions (Signed)
 Radial Site Care  This sheet gives you information about how to care for yourself after your procedure. Your health care provider may also give you more specific instructions. If you have problems or questions, contact your health care provider. What can I expect after the procedure? After the procedure, it is common to have:  Bruising and tenderness at the catheter insertion area. Follow these instructions at home: Medicines  Take over-the-counter and prescription medicines only as told by your health care provider. Insertion site care  Follow instructions from your health care provider about how to take care of your insertion site. Make sure you: ? Wash your hands with soap and water before you change your bandage (dressing). If soap and water are not available, use hand sanitizer. ? Change your dressing as told by your health care provider. ? Leave stitches (sutures), skin glue, or adhesive strips in place. These skin closures may need to stay in place for 2 weeks or longer. If adhesive strip edges start to loosen and curl up, you may trim the loose edges. Do not remove adhesive strips completely unless your health care provider tells you to do that.  Check your insertion site every day for signs of infection. Check for: ? Redness, swelling, or pain. ? Fluid or blood. ? Pus or a bad smell. ? Warmth.  Do not take baths, swim, or use a hot tub until your health care provider approves.  You may shower 24-48 hours after the procedure, or as directed by your health care provider. ? Remove the dressing and gently wash the site with plain soap and water. ? Pat the area dry with a clean towel. ? Do not rub the site. That could cause bleeding.  Do not apply powder or lotion to the site. Activity   For 24 hours after the procedure, or as directed by your health care provider: ? Do not flex or bend the affected arm. ? Do not push or pull heavy objects with the affected arm. ? Do not  drive yourself home from the hospital or clinic. You may drive 24 hours after the procedure unless your health care provider tells you not to. ? Do not operate machinery or power tools.  Do not lift anything that is heavier than 10 lb (4.5 kg), or the limit that you are told, until your health care provider says that it is safe.  Ask your health care provider when it is okay to: ? Return to work or school. ? Resume usual physical activities or sports. ? Resume sexual activity. General instructions  If the catheter site starts to bleed, raise your arm and put firm pressure on the site. If the bleeding does not stop, get help right away. This is a medical emergency.  If you went home on the same day as your procedure, a responsible adult should be with you for the first 24 hours after you arrive home.  Keep all follow-up visits as told by your health care provider. This is important. Contact a health care provider if:  You have a fever.  You have redness, swelling, or yellow drainage around your insertion site. Get help right away if:  You have unusual pain at the radial site.  The catheter insertion area swells very fast.  The insertion area is bleeding, and the bleeding does not stop when you hold steady pressure on the area.  Your arm or hand becomes pale, cool, tingly, or numb. These symptoms may represent a serious   problem that is an emergency. Do not wait to see if the symptoms will go away. Get medical help right away. Call your local emergency services (911 in the [place] Do not drive yourself to the hospital. Summary  After the procedure, it is common to have bruising and tenderness at the site.  Follow instructions from your health care provider about how to take care of your radial site wound. Check the wound every day for signs of infection.  Do not lift anything that is heavier than 10 lb (4.5 kg), or the limit that you are told, until your health care provider says  that it is safe. This information is not intended to replace advice given to you by your health care provider. Make sure you discuss any questions you have with your health care provider. Document Revised: 05/29/2017 Document Reviewed: 05/29/2017 Elsevier Patient Education  2020 Elsevier Inc.        Moderate Conscious Sedation, Adult, Care After These instructions provide you with information about caring for yourself after your procedure. Your health care provider may also give you more specific instructions. Your treatment has been planned according to current medical practices, but problems sometimes occur. Call your health care provider if you have any problems or questions after your procedure. What can I expect after the procedure? After your procedure, it is common:  To feel sleepy for several hours.  To feel clumsy and have poor balance for several hours.  To have poor judgment for several hours.  To vomit if you eat too soon. Follow these instructions at home: For at least 24 hours after the procedure:   Do not: ? Participate in activities where you could fall or become injured. ? Drive. ? Use heavy machinery. ? Drink alcohol. ? Take sleeping pills or medicines that cause drowsiness. ? Make important decisions or sign legal documents. ? Take care of children on your own.  Rest. Eating and drinking  Follow the diet recommended by your health care provider.  If you vomit: ? Drink water, juice, or soup when you can drink without vomiting. ? Make sure you have little or no nausea before eating solid foods. General instructions  Have a responsible adult stay with you until you are awake and alert.  Take over-the-counter and prescription medicines only as told by your health care provider.  If you smoke, do not smoke without supervision.  Keep all follow-up visits as told by your health care provider. This is important. Contact a health care provider  if:  You keep feeling nauseous or you keep vomiting.  You feel light-headed.  You develop a rash.  You have a fever. Get help right away if:  You have trouble breathing. This information is not intended to replace advice given to you by your health care provider. Make sure you discuss any questions you have with your health care provider. Document Revised: 04/05/2017 Document Reviewed: 08/13/2015 Elsevier Patient Education  2020 Elsevier Inc.  

## 2019-08-10 NOTE — Interval H&P Note (Signed)
Cath Lab Visit (complete for each Cath Lab visit)  Clinical Evaluation Leading to the Procedure:   ACS: No.  Non-ACS:    Anginal Classification: CCS II  Anti-ischemic medical therapy: Minimal Therapy (1 class of medications)  Non-Invasive Test Results: Intermediate-risk stress test findings: cardiac mortality 1-3%/year  Prior CABG: No previous CABG      History and Physical Interval Note:  08/10/2019 10:17 AM  [name]  has presented today for surgery, with the diagnosis of abn cta.  The various methods of treatment have been discussed with the patient and family. After consideration of risks, benefits and other options for treatment, the patient has consented to  Procedure(s): LEFT HEART CATH AND CORONARY ANGIOGRAPHY (N/A) as a surgical intervention.  The patient's history has been reviewed, patient examined, no change in status, stable for surgery.  I have reviewed the patient's chart and labs.  Questions were answered to the patient's satisfaction.     Quay Burow

## 2019-08-11 ENCOUNTER — Telehealth: Payer: Self-pay

## 2019-08-11 ENCOUNTER — Telehealth: Payer: Self-pay | Admitting: Cardiovascular Disease

## 2019-08-11 NOTE — Telephone Encounter (Signed)
Pt calling stating that he had a procedure on yesterday, 08/10/19 and has questions and would like Dr. Kennon Holter nurse to give him a call back. Please address

## 2019-08-11 NOTE — Telephone Encounter (Signed)
New Message:   Pt had a Cath yesterday and it was good. He wants to know how soon can he take the COVID Vaccine?

## 2019-08-11 NOTE — Telephone Encounter (Signed)
YOU CAN TAKE THE VACCINE WHEN EVERY YOU HAVE CAN GET AN APPOINTMENT.  PATIENT VERBALIZED UNDERSTANDING.

## 2019-08-11 NOTE — Telephone Encounter (Signed)
Spoke to patient   - he would like to know  when he can remove take bandage off radial cath site.  Patient states it has been 24 hours .  RN informed patient he may take  Bandage off - can place a bandaid but not necessary    patient states he has been a little lighthead to day when he get sup this week .  RN recommend drinking some fluids.  Patient also takes all medication - amlodipine ,losartan and first dose of carvedilol .   RN recommend taking morning medication carvedilol , amlodipine at same time  And then take losartan in the evening with 2nd dose carvedilil  Patient voiced understanding.

## 2019-08-11 NOTE — Telephone Encounter (Signed)
Will route to triage nurses to assist.

## 2019-08-12 MED FILL — AMLODIPINE BESYLATE 10 MG T: 10 | 30 days supply | Qty: 30 | Fill #5

## 2019-08-12 MED FILL — OMEPRAZOLE 20 MG CAP: 20 | 30 days supply | Qty: 30 | Fill #2

## 2019-08-12 MED FILL — LOSARTAN POTASSIUM 100 MG T: 100 | 30 days supply | Qty: 30 | Fill #5

## 2019-08-12 MED FILL — PRAVASTATIN NA 40 MG TAB: 40 | 30 days supply | Qty: 30 | Fill #3

## 2019-08-26 ENCOUNTER — Ambulatory Visit: Payer: Self-pay | Admitting: Cardiovascular Disease

## 2019-09-01 ENCOUNTER — Ambulatory Visit: Payer: Self-pay | Admitting: Cardiovascular Disease

## 2019-09-08 ENCOUNTER — Ambulatory Visit (INDEPENDENT_AMBULATORY_CARE_PROVIDER_SITE_OTHER): Payer: Self-pay | Admitting: Family Medicine

## 2019-09-08 ENCOUNTER — Other Ambulatory Visit (INDEPENDENT_AMBULATORY_CARE_PROVIDER_SITE_OTHER): Payer: Self-pay | Admitting: Family Medicine

## 2019-09-08 ENCOUNTER — Other Ambulatory Visit: Payer: Self-pay

## 2019-09-08 ENCOUNTER — Encounter (INDEPENDENT_AMBULATORY_CARE_PROVIDER_SITE_OTHER): Payer: Self-pay | Admitting: Family Medicine

## 2019-09-08 VITALS — BP 123/83 | HR 90 | Temp 97.2°F | Ht 72.0 in | Wt 249.0 lb

## 2019-09-08 DIAGNOSIS — I10 Essential (primary) hypertension: Secondary | ICD-10-CM

## 2019-09-08 DIAGNOSIS — K219 Gastro-esophageal reflux disease without esophagitis: Secondary | ICD-10-CM

## 2019-09-08 DIAGNOSIS — R072 Precordial pain: Secondary | ICD-10-CM

## 2019-09-08 DIAGNOSIS — Z760 Encounter for issue of repeat prescription: Secondary | ICD-10-CM

## 2019-09-08 DIAGNOSIS — R21 Rash and other nonspecific skin eruption: Secondary | ICD-10-CM

## 2019-09-08 DIAGNOSIS — R7309 Other abnormal glucose: Secondary | ICD-10-CM

## 2019-09-08 DIAGNOSIS — E785 Hyperlipidemia, unspecified: Secondary | ICD-10-CM

## 2019-09-08 LAB — POCT GLYCOSYLATED HEMOGLOBIN (HGB A1C): Hemoglobin A1C: 5.3 % (ref 4.0–5.6)

## 2019-09-08 MED ORDER — LOSARTAN POTASSIUM 100 MG PO TABS: 100.0000 mg | ORAL_TABLET | Freq: Every day | ORAL | 1 refills | Status: DC

## 2019-09-08 MED ORDER — OMEPRAZOLE 20 MG PO CPDR: 20.0000 mg | DELAYED_RELEASE_CAPSULE | Freq: Every day | ORAL | 3 refills | Status: DC

## 2019-09-08 MED ORDER — PRAVASTATIN SODIUM 40 MG PO TABS: 40.0000 mg | ORAL_TABLET | Freq: Every day | ORAL | 3 refills | Status: DC

## 2019-09-08 MED ORDER — AMLODIPINE BESYLATE 10 MG PO TABS: 10.0000 mg | ORAL_TABLET | Freq: Every day | ORAL | 1 refills | Status: DC

## 2019-09-08 MED ORDER — TRIAMCINOLONE ACETONIDE 0.1 % EX CREA: 1.0000 "application " | TOPICAL_CREAM | Freq: Two times a day (BID) | CUTANEOUS | 0 refills | Status: DC

## 2019-09-08 MED ORDER — CARVEDILOL 25 MG PO TABS: 25.0000 mg | ORAL_TABLET | Freq: Two times a day (BID) | ORAL | 3 refills | Status: DC

## 2019-09-08 MED FILL — LOSARTAN POTASSIUM 100 MG T: 100 | 30 days supply | Qty: 30 | Fill #0

## 2019-09-08 MED FILL — CARVEDILOL 25 MG TABLET: 25 | 30 days supply | Qty: 60 | Fill #0

## 2019-09-08 MED FILL — TRIAMCINOLONE ACETONIDE 0.1: 0.1 | 30 days supply | Qty: 60 | Fill #0

## 2019-09-08 MED FILL — AMLODIPINE BESYLATE 10 MG T: 10 | 30 days supply | Qty: 30 | Fill #0

## 2019-09-08 MED FILL — PRAVASTATIN NA 40 MG TAB: 40 | 30 days supply | Qty: 30 | Fill #0

## 2019-09-08 MED FILL — OMEPRAZOLE 20 MG CAP: 20 | 30 days supply | Qty: 30 | Fill #0

## 2019-09-08 NOTE — Patient Instructions (Signed)

## 2019-09-08 NOTE — Progress Notes (Signed)
Established Patient Office Visit  Subjective:  Patient ID: [name], male    DOB: 17-Apr-1961  Age: 59 y.o. MRN: [id number]  CC:  Chief Complaint  Patient presents with  . Hypertension    HPI [name] presents for hypertension follow-up and evaluation of rash x 3 days.  Hypertension, patient has been monitoring BP at home. Readings have been controlled at home. He is exercising inconsistently,however makes efforts to walk. He recently had a heart cath which was negative for any significant blockages. He complains of fatigue and palpitations since starting BP medication and wonders if medication causes these symptoms.   Patient complains of 3 days of rash which he is concern are related bug bites. He denies any infestation problem at home and he has not pets. He frequently visit with a friend that has a pet and has been outdoors at night. The itching is interfering with sleep. Denies any drainage from lesions. Lesions are on localized to arms and back only. Lesion are itchy. Past Medical History:  Diagnosis Date  . Alcohol abuse   . Anxiety   . Arthritis   . Diverticulosis of colon (without mention of hemorrhage)   . GERD (gastroesophageal reflux disease)   . History of kidney stones   . Hyperlipemia   . Hypertension    dr Ronalee Belts  norins  . Kidney stones   . Nephrolithiasis   . Pancreatitis    Hx of   . Ureteral stent displacement ([place]) 1990    Past Surgical History:  Procedure Laterality Date  . A2 pulley flexor sheath cyst 4th finger excisional biopsy     '03  . ANTERIOR CRUCIATE LIGAMENT REPAIR     R knee  . ESOPHAGOGASTRODUODENOSCOPY N/A 07/09/2012   Procedure: ESOPHAGOGASTRODUODENOSCOPY (EGD);  Surgeon: Inda Castle, MD;  Location: [facility];  Service: Endoscopy;  Laterality: N/A;  . KNEE SURGERY  2008  . LEFT HEART CATH AND CORONARY ANGIOGRAPHY N/A 08/10/2019   Procedure: LEFT HEART CATH AND CORONARY ANGIOGRAPHY;  Surgeon: Lorretta Harp, MD;  Location: [facility];  Service: Cardiovascular;  Laterality: N/A;  . SHOULDER ARTHROSCOPY WITH SUBACROMIAL DECOMPRESSION AND OPEN ROTATOR C Right 12/26/2012   Procedure: RIGHT SHOULDER ARTHROSCOPY WITH SUBACROMIAL DECOMPRESSION MINI OPEN ROTATOR CUFF REPAIR, OPEN BICEP TENODESIS OPEN DISTAL CLAVICLE RESECTION  ;  Surgeon: Augustin Schooling, MD;  Location: [place];  Service: Orthopedics;  Laterality: Right;  . TRANSFORAMINAL LUMBAR INTERBODY FUSION (TLIF) WITH PEDICLE SCREW FIXATION 1 LEVEL Right 03/27/2018   Procedure: RIGHT-SIDED LUMBAR 4-5 TRANSFORAMINAL LUMBAR INTERBODY FUSION WITH INSTRUMENTATION AND ALLOGRAFT;  Surgeon: Phylliss Bob, MD;  Location: [place];  Service: Orthopedics;  Laterality: Right;  . URETEROSCOPY     and stone retreval    Family History  Problem Relation Age of Onset  . Hypertension Father        X4 siblings  . Heart disease Father   . Diabetes Mother        x2 brothers    Social History   Socioeconomic History  . Marital status: Married    Spouse name: Not on file  . Number of children: 3  . Years of education: Not on file  . Highest education level: Not on file  Occupational History    Employer: [employer]    Comment: Grounds Keeper   Tobacco Use  . Smoking status: Former Smoker    Types: Cigarettes    Quit date: 07/10/1998  Years since quitting: 21.1  . Smokeless tobacco: Never Used  Substance and Sexual Activity  . Alcohol use: Yes    Alcohol/week: 6.0 standard drinks    Types: 6 Cans of beer per week    Comment: 1 6 pack a week  . Drug use: No  . Sexual activity: Yes  Other Topics Concern  . Not on file  Social History Narrative   HSG   [employer] groundskeeper   Difficult domestic situation, Married '87- separated '01   2 sons- '82, '91; 1 daughter '91   His niece is 36 with metastatic breast cancer (feb '12)   Social Determinants of Health   Financial Resource Strain:   . Difficulty of Paying Living Expenses:   Food Insecurity:   . Worried  About Charity fundraiser in the Last Year:   . Arboriculturist in the Last Year:   Transportation Needs:   . Film/video editor (Medical):   [name] Lack of Transportation (Non-Medical):   Physical Activity:   . Days of Exercise per Week:   . Minutes of Exercise per Session:   Stress:   . Feeling of Stress :   Social Connections:   . Frequency of Communication with Friends and Family:   . Frequency of Social Gatherings with Friends and Family:   . Attends Religious Services:   . Active Member of Clubs or Organizations:   . Attends Archivist Meetings:   [name] Marital Status:   Intimate Partner Violence:   . Fear of Current or Ex-Partner:   . Emotionally Abused:   [name] Physically Abused:   . Sexually Abused:     Outpatient Medications Prior to Visit  Medication Sig Dispense Refill  . acetaminophen (TYLENOL) 500 MG tablet Take 1,000 mg by mouth every 6 (six) hours as needed for mild pain or moderate pain.    [name] aspirin (ASPIRIN EC) 81 MG EC tablet Take 81 mg by mouth daily. Swallow whole.    [name] aspirin-acetaminophen-caffeine (EXCEDRIN MIGRAINE) 250-250-65 MG tablet Take 2 tablets by mouth every 6 (six) hours as needed for headache.    [name] FIBER PO Take 1 capsule by mouth daily.    . Menthol, Topical Analgesic, (BIOFREEZE EX) Apply 1 application topically daily as needed (back pain).    . Multiple Vitamins-Minerals (MULTIVITAMIN WITH MINERALS) tablet Take 1 tablet by mouth daily.    . Tetrahydrozoline HCl (VISINE OP) Place 1 drop into both eyes daily as needed (redness).    [name] amLODipine (NORVASC) 10 MG tablet Take 1 tablet (10 mg total) by mouth daily. MUST MAKE APPT FOR FURTHER REFILLS 90 tablet 1  . carvedilol (COREG) 25 MG tablet Take 1 tablet (25 mg total) by mouth 2 (two) times daily with a meal. 180 tablet 3  . losartan (COZAAR) 100 MG tablet Take 1 tablet (100 mg total) by mouth daily. Must have office visit for refills 90 tablet 1  . omeprazole (PRILOSEC) 20 MG capsule TAKE 1  CAPSULE (20 MG TOTAL) BY MOUTH DAILY. 30 capsule 3  . pravastatin (PRAVACHOL) 40 MG tablet Take 1 tablet (40 mg total) by mouth daily. 90 tablet 3  . diphenhydrAMINE HCl (ZZZQUIL) 50 MG/30ML LIQD Take 50 mg by mouth at bedtime as needed (sleep).    . metoprolol tartrate (LOPRESSOR) 50 MG tablet TAKE 2 HOURS PRIOR TO CT SCAN (Patient not taking: Reported on 08/03/2019) 1 tablet 0   No facility-administered medications prior to visit.    Allergies  Allergen Reactions  . Hydrocodone-Acetaminophen Palpitations  . Shrimp [Shellfish Allergy] Other (See Comments)    Cholesterol level becomes very high. Patient reports sweating and itching.    ROS Review of Systems    Pertinent negatives listed in HPI Objective:    Physical Exam  BP 123/83 (BP Location: Left Arm, Patient Position: Sitting, Cuff Size: Large)   Pulse 90   Temp (!) 97.2 F (36.2 C) (Temporal)   Ht 6' (1.829 m)   Wt 249 lb (112.9 kg)   SpO2 93%   BMI 33.77 kg/m    Constitutional: Patient appears well-developed and well-nourished. No distress. HENT: Normocephalic, atraumatic, External right and left ear normal. Oropharynx is clear and moist.  Eyes: Conjunctivae and EOM are normal. PERRLA, no scleral icterus. Neck: Normal ROM. Neck supple. No JVD. No tracheal deviation. No thyromegaly. CVS: RRR, S1/S2 +, no murmurs, no gallops, no carotid bruit.  Pulmonary: Effort and breath sounds normal, no stridor, rhonchi, wheezes, rales.  Abdominal: Soft. BS +, no distension, tenderness, rebound or guarding.  Musculoskeletal: Normal range of motion. No edema and no tenderness.  Lymphadenopathy: No lymphadenopathy noted, cervical, inguinal or axillary Neuro: Alert. Normal reflexes, muscle tone coordination. No cranial nerve deficit. Skin: Skin is warm and dry. No rash noted. Not diaphoretic. No erythema. No pallor. Psychiatric: Normal mood and affect. Behavior, judgment, thought content normal.  Filed Weights   09/08/19 1339   Weight: 249 lb (112.9 kg)     Health Maintenance Due  Topic Date Due  . COVID-19 Vaccine (1) Never done    There are no preventive care reminders to display for this patient.  Lab Results  Component Value Date   TSH 0.693 07/10/2016   Lab Results  Component Value Date   WBC 9.8 07/31/2019   HGB 12.9 (L) 07/31/2019   HCT 37.5 07/31/2019   MCV 92 07/31/2019   PLT 273 07/31/2019   Lab Results  Component Value Date   NA 139 07/31/2019   K 4.1 07/31/2019   CO2 21 07/31/2019   GLUCOSE 113 (H) 07/31/2019   BUN 17 07/31/2019   CREATININE 1.12 07/31/2019   BILITOT 0.6 04/29/2019   ALKPHOS 66 04/29/2019   AST 33 04/29/2019   ALT 45 (H) 04/29/2019   PROT 7.4 04/29/2019   ALBUMIN 4.9 04/29/2019   CALCIUM 9.3 07/31/2019   ANIONGAP 12 05/29/2018   GFR 100.87 03/11/2014   Lab Results  Component Value Date   CHOL 212 (H) 04/29/2019   Lab Results  Component Value Date   HDL 66 04/29/2019   Lab Results  Component Value Date   LDLCALC 111 (H) 04/29/2019   Lab Results  Component Value Date   TRIG 205 (H) 04/29/2019   Lab Results  Component Value Date   CHOLHDL 3.2 04/29/2019   Lab Results  Component Value Date   HGBA1C 5.3 09/08/2019      Assessment & Plan:  1. Elevated glucose - POCT glycosylated hemoglobin (Hb A1C), 5.3, normal today. Contin   2. Hypertension, unspecified type, controlled today Continue medications. Limit sodium.  Encourage routine consistent daily exercise - carvedilol (COREG) 25 MG tablet; Take 1 tablet (25 mg total) by mouth 2 (two) times daily with a meal.  Dispense: 180 tablet; Refill: 3 - losartan (COZAAR) 100 MG tablet; Take 1 tablet (100 mg total) by mouth daily. Must have office visit for refills  Dispense: 90 tablet; Refill: 1 - amLODipine (NORVASC) 10 MG tablet; Take 1 tablet (10 mg total) by mouth  daily. MUST MAKE APPT FOR FURTHER REFILLS  Dispense: 90 tablet; Refill: 1  3. Medication refill - omeprazole (PRILOSEC) 20 MG  capsule; Take 1 capsule (20 mg total) by mouth daily.  Dispense: 30 capsule; Refill: 3 - pravastatin (PRAVACHOL) 40 MG tablet; Take 1 tablet (40 mg total) by mouth daily.  Dispense: 90 tablet; Refill: 3  4. Rash and nonspecific skin eruption - triamcinolone cream (KENALOG) 0.1 %; Apply 1 application topically 2 (two) times daily.  Dispense: 60 g; Refill: 0  Meds ordered this encounter  Medications  . carvedilol (COREG) 25 MG tablet    Sig: Take 1 tablet (25 mg total) by mouth 2 (two) times daily with a meal.    Dispense:  180 tablet    Refill:  3  . omeprazole (PRILOSEC) 20 MG capsule    Sig: Take 1 capsule (20 mg total) by mouth daily.    Dispense:  30 capsule    Refill:  3  . pravastatin (PRAVACHOL) 40 MG tablet    Sig: Take 1 tablet (40 mg total) by mouth daily.    Dispense:  90 tablet    Refill:  3  . losartan (COZAAR) 100 MG tablet    Sig: Take 1 tablet (100 mg total) by mouth daily. Must have office visit for refills    Dispense:  90 tablet    Refill:  1    Must have office visit for refills  . amLODipine (NORVASC) 10 MG tablet    Sig: Take 1 tablet (10 mg total) by mouth daily. MUST MAKE APPT FOR FURTHER REFILLS    Dispense:  90 tablet    Refill:  1  . triamcinolone cream (KENALOG) 0.1 %    Sig: Apply 1 application topically 2 (two) times daily.    Dispense:  60 g    Refill:  0    Follow-up: 6 months for HTN and Hyperlipidemia follow-up  Molli Barrows, FNP

## 2019-09-10 ENCOUNTER — Ambulatory Visit (INDEPENDENT_AMBULATORY_CARE_PROVIDER_SITE_OTHER): Payer: Self-pay | Admitting: Primary Care

## 2019-09-16 ENCOUNTER — Encounter: Payer: Self-pay | Admitting: Cardiovascular Disease

## 2019-09-16 ENCOUNTER — Other Ambulatory Visit: Payer: Self-pay

## 2019-09-16 ENCOUNTER — Ambulatory Visit (INDEPENDENT_AMBULATORY_CARE_PROVIDER_SITE_OTHER): Payer: Self-pay | Admitting: Cardiovascular Disease

## 2019-09-16 VITALS — BP 140/90 | HR 78 | Ht 72.0 in | Wt 250.2 lb

## 2019-09-16 DIAGNOSIS — R079 Chest pain, unspecified: Secondary | ICD-10-CM

## 2019-09-16 DIAGNOSIS — I10 Essential (primary) hypertension: Secondary | ICD-10-CM

## 2019-09-16 DIAGNOSIS — E781 Pure hyperglyceridemia: Secondary | ICD-10-CM

## 2019-09-16 MED ORDER — ATORVASTATIN CALCIUM 40 MG PO TABS: 40.0000 mg | ORAL_TABLET | Freq: Every day | ORAL | 3 refills | Status: DC

## 2019-09-16 NOTE — Assessment & Plan Note (Signed)
History of atypical chest pain with a coronary CTA performed 07/29/2019 suggesting a proximal LAD lesion.  Based on this I performed outpatient radial diagnostic coronary angiography 08/10/2019 which revealed at most a 40% proximal LAD lesion suggesting that his CTA was falsely positive and his chest pain was most likely noncardiac.  Since his calf his chest pain is since resolved and his right radial puncture site is healed.

## 2019-09-16 NOTE — Patient Instructions (Signed)
Medication Instructions:  STOP PRAVASTATIN  START ATORVASTATIN 40 MG ONCE DAILY  *If you need a refill on your cardiac medications before your next appointment, please call your pharmacy*   Lab Work: Your physician recommends that you return for lab work in: 2 [address] [place]  If you have labs (blood work) drawn today and your tests are completely normal, you will receive your results only by: [name] MyChart Message (if you have MyChart) OR . A paper copy in the mail If you have any lab test that is abnormal or we need to change your treatment, we will call you to review the results.   Follow-Up: At [facility], you and your health needs are our priority.  As part of our continuing mission to provide you with exceptional heart care, we have created designated Provider Care Teams.  These Care Teams include your primary Cardiologist (physician) and Advanced Practice Providers (APPs -  Physician Assistants and Nurse Practitioners) who all work together to provide you with the care you need, when you need it.  We recommend signing up for the patient portal called "MyChart".  Sign up information is provided on this After Visit Summary.  MyChart is used to connect with patients for Virtual Visits (Telemedicine).  Patients are able to view lab/test results, encounter notes, upcoming appointments, etc.  Non-urgent messages can be sent to your provider as well.   To learn more about what you can do with MyChart, go to [email or web address].    Your next appointment:    Your physician wants you to follow-up in: [place] will receive a reminder letter in the mail two months in advance. If you don't receive a letter, please call our office to schedule the follow-up appointment.   Your physician wants you to follow-up in: [place] will receive a reminder letter in the mail two months in advance. If you don't receive a letter, please call our office to schedule the  follow-up appointment.

## 2019-09-16 NOTE — Assessment & Plan Note (Signed)
History of hypertriglyceridemia on pravastatin.  Last lipid profile performed 04/29/2019 revealed cholesterol 212, LDL 111 HDL 66.  I am going to change him to atorvastatin 40 mg a day and we will recheck lipid liver in 2 months.

## 2019-09-16 NOTE — Assessment & Plan Note (Signed)
History of essential hypertension with blood pressure measured today 140/90.  He is on amlodipine, carvedilol and losartan.  He avoid salt.  Blood pressures have been better at home.

## 2019-09-16 NOTE — Progress Notes (Signed)
09/16/2019 [name]   1961/02/18  [id number]  Primary Physician [name], NP Primary Cardiologist: Lorretta Harp MD [name], [place]  HPI:  [name] is a 59 y.o.  moderately overweight separated African-American male father of 34, grandfather of 3 grandchildren who is currently disabled from being a Manufacturing engineer at [employer] .    I last saw him in the office 07/24/2019.  He had back issues and back surgery.  He is referred by Juluis Mire, NP for evaluation of chest pain.  His risk factors include remote tobacco abuse, treated hypertension and hyperlipidemia as well as family history with a brother who had stents.  He is never had a heart tach or stroke.  He does not admit to drinking excessive alcohol.  He was recently started on pravastatin by his PCP.  He has new onset chest pain over the last several weeks occurring several times a week with some characteristics of GERD and some atypical characteristics as well.  These are also associated with palpitations.  I performed a coronary CTA on him revealing proximal LAD lesion on 07/30/2019 that is led to outpatient diagnostic coronary angiogram via the right radial approach 08/10/2019 that revealed noncritical CAD with most 40% proximal LAD lesion thought not to be physiologically significant.  Since that time his chest pain has since resolved.   Current Meds  Medication Sig  . acetaminophen (TYLENOL) 500 MG tablet Take 1,000 mg by mouth every 6 (six) hours as needed for mild pain or moderate pain.  [name] amLODipine (NORVASC) 10 MG tablet Take 1 tablet (10 mg total) by mouth daily. MUST MAKE APPT FOR FURTHER REFILLS  . aspirin (ASPIRIN EC) 81 MG EC tablet Take 81 mg by mouth daily. Swallow whole.  [name] aspirin-acetaminophen-caffeine (EXCEDRIN MIGRAINE) 250-250-65 MG tablet Take 2 tablets by mouth every 6 (six) hours as needed for headache.  . carvedilol (COREG) 25 MG tablet Take 1 tablet (25 mg total) by mouth 2 (two)  times daily with a meal.  . FIBER PO Take 1 capsule by mouth daily.  [name] losartan (COZAAR) 100 MG tablet Take 1 tablet (100 mg total) by mouth daily. Must have office visit for refills  . Menthol, Topical Analgesic, (BIOFREEZE EX) Apply 1 application topically daily as needed (back pain).  . Multiple Vitamins-Minerals (MULTIVITAMIN WITH MINERALS) tablet Take 1 tablet by mouth daily.  [name] omeprazole (PRILOSEC) 20 MG capsule Take 1 capsule (20 mg total) by mouth daily.  . Tetrahydrozoline HCl (VISINE OP) Place 1 drop into both eyes daily as needed (redness).  . triamcinolone cream (KENALOG) 0.1 % Apply 1 application topically 2 (two) times daily.  . [DISCONTINUED] pravastatin (PRAVACHOL) 40 MG tablet Take 1 tablet (40 mg total) by mouth daily.     Allergies  Allergen Reactions  . Hydrocodone-Acetaminophen Palpitations  . Shrimp [Shellfish Allergy] Other (See Comments)    Cholesterol level becomes very high. Patient reports sweating and itching.    Social History   Socioeconomic History  . Marital status: Married    Spouse name: Not on file  . Number of children: 3  . Years of education: Not on file  . Highest education level: Not on file  Occupational History    Employer: [employer]    Comment: Grounds Keeper   Tobacco Use  . Smoking status: Former Smoker    Types: Cigarettes    Quit date: 07/10/1998    Years since  quitting: 21.2  . Smokeless tobacco: Never Used  Substance and Sexual Activity  . Alcohol use: Yes    Alcohol/week: 6.0 standard drinks    Types: 6 Cans of beer per week    Comment: 1 6 pack a week  . Drug use: No  . Sexual activity: Yes  Other Topics Concern  . Not on file  Social History Narrative   HSG   [employer] groundskeeper   Difficult domestic situation, Married '87- separated '01   2 sons- '82, '91; 1 daughter '91   His niece is 12 with metastatic breast cancer (feb '12)   Social Determinants of Health   Financial Resource Strain:   . Difficulty of  Paying Living Expenses:   Food Insecurity:   . Worried About Charity fundraiser in the Last Year:   . Arboriculturist in the Last Year:   Transportation Needs:   . Film/video editor (Medical):   [name] Lack of Transportation (Non-Medical):   Physical Activity:   . Days of Exercise per Week:   . Minutes of Exercise per Session:   Stress:   . Feeling of Stress :   Social Connections:   . Frequency of Communication with Friends and Family:   . Frequency of Social Gatherings with Friends and Family:   . Attends Religious Services:   . Active Member of Clubs or Organizations:   . Attends Archivist Meetings:   [name] Marital Status:   Intimate Partner Violence:   . Fear of Current or Ex-Partner:   . Emotionally Abused:   [name] Physically Abused:   . Sexually Abused:      Review of Systems: General: negative for chills, fever, night sweats or weight changes.  Cardiovascular: negative for chest pain, dyspnea on exertion, edema, orthopnea, palpitations, paroxysmal nocturnal dyspnea or shortness of breath Dermatological: negative for rash Respiratory: negative for cough or wheezing Urologic: negative for hematuria Abdominal: negative for nausea, vomiting, diarrhea, bright red blood per rectum, melena, or hematemesis Neurologic: negative for visual changes, syncope, or dizziness All other systems reviewed and are otherwise negative except as noted above.    Blood pressure 140/90, pulse 78, height 6' (1.829 m), weight 250 lb 3.2 oz (113.5 kg), SpO2 97 %.  General appearance: alert and no distress Neck: no adenopathy, no carotid bruit, no JVD, supple, symmetrical, trachea midline and thyroid not enlarged, symmetric, no tenderness/mass/nodules Lungs: clear to auscultation bilaterally Heart: regular rate and rhythm, S1, S2 normal, no murmur, click, rub or gallop Extremities: extremities normal, atraumatic, no cyanosis or edema Pulses: 2+ and symmetric Skin: Skin color, texture,  turgor normal. No rashes or lesions Neurologic: Alert and oriented X 3, normal strength and tone. Normal symmetric reflexes. Normal coordination and gait  EKG not performed today  ASSESSMENT AND PLAN:   Essential hypertension History of essential hypertension with blood pressure measured today 140/90.  He is on amlodipine, carvedilol and losartan.  He avoid salt.  Blood pressures have been better at home.  Hypertriglyceridemia History of hypertriglyceridemia on pravastatin.  Last lipid profile performed 04/29/2019 revealed cholesterol 212, LDL 111 HDL 66.  I am going to change him to atorvastatin 40 mg a day and we will recheck lipid liver in 2 months.  Chest pain of uncertain etiology History of atypical chest pain with a coronary CTA performed 07/29/2019 suggesting a proximal LAD lesion.  Based on this I performed outpatient radial diagnostic coronary angiography 08/10/2019 which revealed at most a 40% proximal LAD  lesion suggesting that his CTA was falsely positive and his chest pain was most likely noncardiac.  Since his calf his chest pain is since resolved and his right radial puncture site is healed.      Lorretta Harp MD FACP,FACC,FAHA, [facility] 09/16/2019 3:13 PM

## 2019-09-21 ENCOUNTER — Other Ambulatory Visit (INDEPENDENT_AMBULATORY_CARE_PROVIDER_SITE_OTHER): Payer: Self-pay | Admitting: Primary Care

## 2019-09-21 DIAGNOSIS — I10 Essential (primary) hypertension: Secondary | ICD-10-CM

## 2019-09-28 ENCOUNTER — Ambulatory Visit (INDEPENDENT_AMBULATORY_CARE_PROVIDER_SITE_OTHER): Payer: Self-pay | Admitting: Primary Care

## 2019-10-15 ENCOUNTER — Telehealth (INDEPENDENT_AMBULATORY_CARE_PROVIDER_SITE_OTHER): Payer: Self-pay | Admitting: Primary Care

## 2019-10-15 NOTE — Telephone Encounter (Signed)
Please advise if patient is ok to take seamoss supplement.

## 2019-10-15 NOTE — Telephone Encounter (Signed)
Patient called saying that he would like to take seamoss 600 mg supplement. Patient would like to know if it's fine to take the supplement with his other medications. Please f/u

## 2019-10-15 NOTE — Telephone Encounter (Signed)
Its natural advise patient to check with pharmacist who fills his medication for any interactions

## 2019-10-16 NOTE — Telephone Encounter (Signed)
Patient is aware of advise per PCP.

## 2019-10-21 MED FILL — AMLODIPINE BESYLATE 10 MG T: 10 | 30 days supply | Qty: 30 | Fill #1

## 2019-11-13 MED FILL — LOSARTAN POTASSIUM 100 MG T: 100 | 30 days supply | Qty: 30 | Fill #1

## 2019-11-13 MED FILL — OMEPRAZOLE 20 MG CAP: 20 | 30 days supply | Qty: 30 | Fill #2

## 2019-11-13 MED FILL — ATORVASTATIN CALCIUM 40 MG: 40 | 30 days supply | Qty: 30 | Fill #2

## 2019-11-20 MED FILL — AMLODIPINE BESYLATE 10 MG T: 10 | 30 days supply | Qty: 30 | Fill #2

## 2019-11-20 MED FILL — CARVEDILOL 25 MG TABLET: 25 | 30 days supply | Qty: 60 | Fill #2

## 2019-11-23 ENCOUNTER — Telehealth: Payer: Self-pay | Admitting: Primary Care

## 2019-11-23 NOTE — Telephone Encounter (Signed)
Copied from [place] [id number]. Topic: Referral - Request for Referral >> Nov 18, 2019  4:48 PM Percell Belt A wrote: Has patient seen PCP for this complaint? Yes.   *If NO, is insurance requiring patient see PCP for this issue before PCP can refer them? Referral for which specialty: Pain management  Preferred provider/office: any office that dr recommends Reason for referral: pt stated that he had back surgery in 2019 and now he is having leg pain and back pain.  OTC meds are not helping at all.  Please advise  Best number -[phone] >> Nov 23, 2019  3:07 PM Oneta Rack wrote: Patient would like a follow up call today regarding 11/19/2019 referral request

## 2019-11-25 ENCOUNTER — Other Ambulatory Visit (INDEPENDENT_AMBULATORY_CARE_PROVIDER_SITE_OTHER): Payer: Self-pay | Admitting: Primary Care

## 2019-11-25 DIAGNOSIS — M48061 Spinal stenosis, lumbar region without neurogenic claudication: Secondary | ICD-10-CM

## 2019-11-25 NOTE — Telephone Encounter (Signed)
Order placed for pain clinic

## 2019-12-17 MED FILL — AMLODIPINE BESYLATE 10 MG T: 10 | 30 days supply | Qty: 30 | Fill #3

## 2019-12-17 MED FILL — ATORVASTATIN CALCIUM 40 MG: 40 | 30 days supply | Qty: 30 | Fill #3

## 2019-12-17 MED FILL — OMEPRAZOLE 20 MG CAP: 20 | 30 days supply | Qty: 30 | Fill #3

## 2019-12-23 MED FILL — LOSARTAN POTASSIUM 100 MG T: 100 | 30 days supply | Qty: 30 | Fill #2

## 2019-12-23 MED FILL — CARVEDILOL 25 MG TABLET: 25 | 30 days supply | Qty: 60 | Fill #3

## 2020-01-14 ENCOUNTER — Other Ambulatory Visit (INDEPENDENT_AMBULATORY_CARE_PROVIDER_SITE_OTHER): Payer: Self-pay | Admitting: Primary Care

## 2020-01-14 ENCOUNTER — Telehealth (INDEPENDENT_AMBULATORY_CARE_PROVIDER_SITE_OTHER): Payer: Self-pay | Admitting: Primary Care

## 2020-01-14 DIAGNOSIS — K029 Dental caries, unspecified: Secondary | ICD-10-CM

## 2020-01-14 MED ORDER — CHLORHEXIDINE GLUCONATE 0.12 % MT SOLN: 15.0000 mL | Freq: Two times a day (BID) | OROMUCOSAL | 0 refills | Status: DC

## 2020-01-14 NOTE — Telephone Encounter (Signed)
Pt called in and stated that he was prescibed a solution /mouth wash a while back for the swelling of his gums/ Pt is having this swelling again and states he doesn't have a dentist and can not afford it at this time/ he asked if Sharyn Lull can send in an Rx for this mouthwash to [facility]/ pt can not remember the name of the wash / please advise asap

## 2020-01-14 NOTE — Telephone Encounter (Signed)
Please advise and send medication if appropriate.

## 2020-01-15 MED FILL — OMEPRAZOLE 20 MG CAP: 20 | 30 days supply | Qty: 30 | Fill #3

## 2020-01-15 MED FILL — CHLORHEXIDINE 0.12% RINSE: 0.12 | 16 days supply | Qty: 473 | Fill #0

## 2020-01-15 MED FILL — AMLODIPINE BESYLATE 10 MG T: 10 | 30 days supply | Qty: 30 | Fill #4

## 2020-01-15 MED FILL — ATORVASTATIN CALCIUM 40 MG: 40 | 30 days supply | Qty: 30 | Fill #4

## 2020-01-15 NOTE — Telephone Encounter (Signed)
Patient is aware that oral rinse has been sent and a dental referral has been placed.

## 2020-01-29 MED FILL — CARVEDILOL 25 MG TABLET: 25 | 30 days supply | Qty: 60 | Fill #4

## 2020-02-08 ENCOUNTER — Telehealth: Payer: Self-pay | Admitting: General Practice

## 2020-02-08 ENCOUNTER — Encounter: Payer: Self-pay | Admitting: General Practice

## 2020-02-08 ENCOUNTER — Telehealth (INDEPENDENT_AMBULATORY_CARE_PROVIDER_SITE_OTHER): Payer: Self-pay | Admitting: General Practice

## 2020-02-08 VITALS — BP 130/90 | Wt 250.0 lb

## 2020-02-08 DIAGNOSIS — E781 Pure hyperglyceridemia: Secondary | ICD-10-CM

## 2020-02-08 DIAGNOSIS — I10 Essential (primary) hypertension: Secondary | ICD-10-CM

## 2020-02-08 DIAGNOSIS — I2510 Atherosclerotic heart disease of native coronary artery without angina pectoris: Secondary | ICD-10-CM

## 2020-02-08 NOTE — Patient Instructions (Signed)
Medication Instructions:  The current medical regimen is effective;  continue present plan and medications as directed. Please refer to the Current Medication list given to you today.  *If you need a refill on your cardiac medications before your next appointment, please call your pharmacy*  Lab Work:   Testing/Procedures:  NONE    NONE  Special Instructions TAKE AND LOG YOUR BLOOD PRESSURE AT LEAST TWICE WEEKLY  PLEASE READ AND FOLLOW SALTY 6-ATTACHED  PLEASE INCREASE PHYSICAL ACTIVITY AS TOLERATED  Follow-Up: Your next appointment:  6 month(s) In Person with Quay Burow, MD -Acacia Villas, FNP-C   At [facility], you and your health needs are our priority.  As part of our continuing mission to provide you with exceptional heart care, we have created designated Provider Care Teams.  These Care Teams include your primary Cardiologist (physician) and Advanced Practice Providers (APPs -  Physician Assistants and Nurse Practitioners) who all work together to provide you with the care you need, when you need it.

## 2020-02-08 NOTE — Telephone Encounter (Signed)
Patient states he has received two calls from [place].  I did not see any notes in his charts.

## 2020-02-08 NOTE — Progress Notes (Signed)
Virtual Visit via Telephone Note   This visit type was conducted due to national recommendations for restrictions regarding the COVID-19 Pandemic (e.g. social distancing) in an effort to limit this patient's exposure and mitigate transmission in our community.  Due to his co-morbid illnesses, this patient is at least at moderate risk for complications without adequate follow up.  This format is felt to be most appropriate for this patient at this time.  The patient did not have access to video technology/had technical difficulties with video requiring transitioning to audio format only (telephone).  All issues noted in this document were discussed and addressed.  No physical exam could be performed with this format.  Please refer to the patient's chart for his  consent to telehealth for [facility].  Evaluation Performed:  Follow-up visit  This visit type was conducted due to national recommendations for restrictions regarding the COVID-19 Pandemic (e.g. social distancing).  This format is felt to be most appropriate for this patient at this time.  All issues noted in this document were discussed and addressed.  No physical exam was performed (except for noted visual exam findings with Video Visits).  Please refer to the patient's chart (MyChart message for video visits and phone note for telephone visits) for the patient's consent to telehealth for [place]  Date:  02/08/2020   ID:  [name], DOB [birth year], MRN [id number]  Patient Location:  [address] [place] [address]   Provider location:     [place] [address] Office [phone] Fax [phone]   PCP:  [name], NP  Cardiologist:  Quay Burow, MD  Electrophysiologist:  None   Chief Complaint:  Follow-up for chest pain, hypertension, and hyperlipidemia  History of Present Illness:    [name] is a 59 y.o. male who presents via  audio/video conferencing for a telehealth visit today.  Patient verified DOB and address.  He has a PMH of hypertension, hyperlipidemia, tobacco abuse, pancreatitis, GERD, anxiety, arthritis, and coronary artery disease.  He was referred by his PCP for evaluation of his chest pain 07/24/2019.  His chest pain was felt to be atypical.  He underwent coronary CTA on 07/30/2019 which showed proximal LAD lesion.  He then underwent LHC 08/10/2019 which showed a noncritical 40% proximal LAD lesion and medical management was recommended.  It was felt that the lesion was not contributing to his chest discomfort because he was asymptomatic at the time of his cath.  He is seen today virtually and states he feels well. He does state that his diet could be better. However, he is having some difficulty tolerating different types of foods. He is currently trying to take probiotics to help with his stomach issues. He states that he continues to be physically active walking 1.5 miles daily and occasionally playing golf. He states that he tries to stay away from high salt food options. We will give him the salty 6 diet sheet, have him increase his physical activity as tolerated, give him a blood pressure log and have him return for follow-up in 6 months.  Today he denies chest pain, shortness of breath, lower extremity edema, fatigue, palpitations, melena, hematuria, hemoptysis, diaphoresis, weakness, presyncope, syncope, orthopnea, and PND.   The patient does not symptoms concerning for COVID-19 infection (fever, chills, cough, or new SHORTNESS OF BREATH).    Prior CV studies:   The following studies were reviewed today:  Coronary  CTA 07/28/2019 IMPRESSION: 1. Severe CAD in proximal LAD, CADRADS = 4. CT FFR will be performed and reported separately.  2. Coronary artery calcium score is 207, which places the patient in the 94 percentile for age and sex matched control.  3. Normal coronary origins with right  dominance.  4. Mild ascending aorta dilation, 41 mm at mid ascending aorta.   Electronically Signed   By: Cherlynn Kaiser  Cardiac catheterization 08/10/2019  Prox LAD to Mid LAD lesion is 40% stenosed.  The left ventricular systolic function is normal.  LV end diastolic pressure is normal.   [name] is a 59 y.o. male  Diagnostic Dominance: Right  Intervention    Past Medical History:  Diagnosis Date  . Alcohol abuse   . Anxiety   . Arthritis   . Diverticulosis of colon (without mention of hemorrhage)   . GERD (gastroesophageal reflux disease)   . History of kidney stones   . Hyperlipemia   . Hypertension    dr Ronalee Belts  norins  . Kidney stones   . Nephrolithiasis   . Pancreatitis    Hx of   . Ureteral stent displacement ([place]) 1990   Past Surgical History:  Procedure Laterality Date  . A2 pulley flexor sheath cyst 4th finger excisional biopsy     '03  . ANTERIOR CRUCIATE LIGAMENT REPAIR     R knee  . ESOPHAGOGASTRODUODENOSCOPY N/A 07/09/2012   Procedure: ESOPHAGOGASTRODUODENOSCOPY (EGD);  Surgeon: Inda Castle, MD;  Location: [facility];  Service: Endoscopy;  Laterality: N/A;  . KNEE SURGERY  2008  . LEFT HEART CATH AND CORONARY ANGIOGRAPHY N/A 08/10/2019   Procedure: LEFT HEART CATH AND CORONARY ANGIOGRAPHY;  Surgeon: Lorretta Harp, MD;  Location: [facility];  Service: Cardiovascular;  Laterality: N/A;  . SHOULDER ARTHROSCOPY WITH SUBACROMIAL DECOMPRESSION AND OPEN ROTATOR C Right 12/26/2012   Procedure: RIGHT SHOULDER ARTHROSCOPY WITH SUBACROMIAL DECOMPRESSION MINI OPEN ROTATOR CUFF REPAIR, OPEN BICEP TENODESIS OPEN DISTAL CLAVICLE RESECTION  ;  Surgeon: Augustin Schooling, MD;  Location: [facility];  Service: Orthopedics;  Laterality: Right;  . TRANSFORAMINAL LUMBAR INTERBODY FUSION (TLIF) WITH PEDICLE SCREW FIXATION 1 LEVEL Right 03/27/2018   Procedure: RIGHT-SIDED LUMBAR 4-5 TRANSFORAMINAL LUMBAR INTERBODY FUSION WITH INSTRUMENTATION AND  ALLOGRAFT;  Surgeon: Phylliss Bob, MD;  Location: [facility];  Service: Orthopedics;  Laterality: Right;  . URETEROSCOPY     and stone retreval     Current Meds  Medication Sig  . acetaminophen (TYLENOL) 500 MG tablet Take 1,000 mg by mouth every 6 (six) hours as needed for mild pain or moderate pain.  [name] amLODipine (NORVASC) 10 MG tablet Take 1 tablet (10 mg total) by mouth daily. MUST MAKE APPT FOR FURTHER REFILLS  . aspirin (ASPIRIN EC) 81 MG EC tablet Take 81 mg by mouth daily. Swallow whole.  [name] aspirin-acetaminophen-caffeine (EXCEDRIN MIGRAINE) 250-250-65 MG tablet Take 2 tablets by mouth every 6 (six) hours as needed for headache.  [name] atorvastatin (LIPITOR) 40 MG tablet Take 1 tablet (40 mg total) by mouth daily.  . carvedilol (COREG) 25 MG tablet Take 1 tablet (25 mg total) by mouth 2 (two) times daily with a meal.  . chlorhexidine (PERIDEX) 0.12 % solution Use as directed 15 mLs in the mouth or throat 2 (two) times daily.  [name] FIBER PO Take 1 capsule by mouth daily.  [name] losartan (COZAAR) 100 MG tablet Take 1 tablet (100 mg total) by mouth daily. Must have office visit for refills  .  Menthol, Topical Analgesic, (BIOFREEZE EX) Apply 1 application topically daily as needed (back pain).  . Multiple Vitamins-Minerals (MULTIVITAMIN WITH MINERALS) tablet Take 1 tablet by mouth daily.  [name] omeprazole (PRILOSEC) 20 MG capsule Take 1 capsule (20 mg total) by mouth daily.  . Tetrahydrozoline HCl (VISINE OP) Place 1 drop into both eyes daily as needed (redness).  . triamcinolone cream (KENALOG) 0.1 % Apply 1 application topically 2 (two) times daily.     Allergies:   Hydrocodone-acetaminophen and Shrimp [shellfish allergy]   Social History   Tobacco Use  . Smoking status: Former Smoker    Types: Cigarettes    Quit date: 07/10/1998    Years since quitting: 21.5  . Smokeless tobacco: Never Used  Vaping Use  . Vaping Use: Never used  Substance Use Topics  . Alcohol use: Yes    Alcohol/week: 6.0  standard drinks    Types: 6 Cans of beer per week    Comment: 1 6 pack a week  . Drug use: No     Family Hx: The patient's family history includes Diabetes in his mother; Heart disease in his father; Hypertension in his father.  ROS:   Please see the history of present illness.     All other systems reviewed and are negative.   Labs/Other Tests and Data Reviewed:    Recent Labs: 04/29/2019: ALT 45 07/31/2019: BUN 17; Creatinine, Ser 1.12; Hemoglobin 12.9; Platelets 273; Potassium 4.1; Sodium 139   Recent Lipid Panel Lab Results  Component Value Date/Time   CHOL 212 (H) 04/29/2019 03:49 PM   TRIG 205 (H) 04/29/2019 03:49 PM   HDL 66 04/29/2019 03:49 PM   CHOLHDL 3.2 04/29/2019 03:49 PM   CHOLHDL 3 01/24/2012 03:35 PM   LDLCALC 111 (H) 04/29/2019 03:49 PM   LDLDIRECT 107.5 01/24/2012 03:35 PM    Wt Readings from Last 3 Encounters:  02/08/20 250 lb (113.4 kg)  09/16/19 250 lb 3.2 oz (113.5 kg)  09/08/19 249 lb (112.9 kg)     Exam:    Vital Signs:  BP 130/90   Wt 250 lb (113.4 kg)   BMI 33.91 kg/m    Well nourished, well developed male in no  acute distress.   ASSESSMENT & PLAN:    1.  Nonobstructive coronary artery disease-no chest pain today.  Underwent cardiac catheterization 08/10/2019 which showed a 40% LAD lesion.  Medical management was recommended. Continue amlodipine, aspirin, atorvastatin, carvedilol, losartan Heart healthy low-sodium diet-salty 6 given Increase physical activity as tolerated  Essential hypertension-BP today 130/90.  Well-controlled at home. Continue carvedilol, losartan, amlodipine Heart healthy low-sodium diet-salty 6 given Increase physical activity as tolerated Blood pressure log  Hyperlipidemia-04/29/2019: Cholesterol, Total 212; HDL 66; LDL Chol Calc (NIH) 111; Triglycerides 205 Continue atorvastatin Heart healthy low-sodium high-fiber diet Increase physical activity as tolerated  Disposition: Follow-up with me or Dr. Gwenlyn Found  in 6 months.  COVID-19 Education: The signs and symptoms of COVID-19 were discussed with the patient and how to seek care for testing (follow up with PCP or arrange E-visit).  The importance of social distancing was discussed today.  Patient Risk:   After full review of this patients clinical status, I feel that they are at least moderate risk at this time.  Time:   Today, I have spent 14:47 minutes with the patient with telehealth technology discussing blood pressure, medication, supplements, physical activity. I spent greater than 20 minutes reviewing his past medical history, cardiac tests, and medications.   Medication Adjustments/Labs and  Tests Ordered: Current medicines are reviewed at length with the patient today.  Concerns regarding medicines are outlined above.   Tests Ordered: No orders of the defined types were placed in this encounter.  Medication Changes: No orders of the defined types were placed in this encounter.   Disposition:  in 6 month(s)  Signed, Jossie Ng. Jazyah Butsch NP-C    12/09/2018 11:58 AM    [place] [address] Office [phone] Fax [phone]

## 2020-02-08 NOTE — Progress Notes (Unsigned)
{Choose 1 Note Type (Telehealth Visit or Telephone Visit):[phone]}  Evaluation Performed:  Follow-up visit  This visit type was conducted due to national recommendations for restrictions regarding the COVID-19 Pandemic (e.g. social distancing).  This format is felt to be most appropriate for this patient at this time.  All issues noted in this document were discussed and addressed.  No physical exam was performed (except for noted visual exam findings with Video Visits).  Please refer to the patient's chart (MyChart message for video visits and phone note for telephone visits) for the patient's consent to telehealth for [name]  Date:  02/08/2020   ID:  [name], DOB Dec 05, 1960, MRN [id number]  Patient Location: *** [address] [place] [address]   Provider location:     [place] [address] Office [phone] Fax [phone]   PCP:  [name], NP  Cardiologist:  Quay Burow, MD *** Electrophysiologist:  None   Chief Complaint: Follow-up for chest pain, hypertension, and hyperlipidemia  History of Present Illness:    [name] is a 59 y.o. male who presents via audio/video conferencing for a telehealth visit today.  Patient verified DOB and address.  He has a PMH of hypertension, hyperlipidemia, tobacco abuse, pancreatitis, GERD, anxiety, arthritis, and coronary artery disease.  He was referred by his PCP for evaluation of his chest pain 07/24/2019.  His chest pain was felt to be atypical.  He underwent coronary CTA on 07/30/2019 which showed proximal LAD lesion.  He then underwent LHC 08/10/2019 which showed a noncritical 40% proximal LAD lesion and medical management was recommended.  It was felt that the lesion was not contributing to his chest discomfort because he was asymptomatic at the time of his cath.  He is seen today virtually and states***  *** denies chest pain, shortness of  breath, lower extremity edema, fatigue, palpitations, melena, hematuria, hemoptysis, diaphoresis, weakness, presyncope, syncope, orthopnea, and PND.    The patient {does/does not:200015} symptoms concerning for COVID-19 infection (fever, chills, cough, or new SHORTNESS OF BREATH).    Prior CV studies:   The following studies were reviewed today:   Coronary CTA 07/28/2019 IMPRESSION: 1. Severe CAD in proximal LAD, CADRADS = 4. CT FFR will be performed and reported separately.  2. Coronary artery calcium score is 207, which places the patient in the 54 percentile for age and sex matched control.  3. Normal coronary origins with right dominance.  4. Mild ascending aorta dilation, 41 mm at mid ascending aorta.   Electronically Signed   By: Cherlynn Kaiser  Cardiac catheterization 08/10/2019  Prox LAD to Mid LAD lesion is 40% stenosed.  The left ventricular systolic function is normal.  LV end diastolic pressure is normal.   [name] is a 59 y.o. male  Diagnostic Dominance: Right  Intervention     Past Medical History:  Diagnosis Date  . Alcohol abuse   . Anxiety   . Arthritis   . Diverticulosis of colon (without mention of hemorrhage)   . GERD (gastroesophageal reflux disease)   . History of kidney stones   . Hyperlipemia   . Hypertension    dr Ronalee Belts  norins  . Kidney stones   . Nephrolithiasis   . Pancreatitis    Hx of   . Ureteral stent displacement ([place]) 1990   Past Surgical History:  Procedure Laterality Date  . A2 pulley flexor sheath cyst 4th finger excisional  biopsy     '03  . ANTERIOR CRUCIATE LIGAMENT REPAIR     R knee  . ESOPHAGOGASTRODUODENOSCOPY N/A 07/09/2012   Procedure: ESOPHAGOGASTRODUODENOSCOPY (EGD);  Surgeon: Inda Castle, MD;  Location: [facility];  Service: Endoscopy;  Laterality: N/A;  . KNEE SURGERY  2008  . LEFT HEART CATH AND CORONARY ANGIOGRAPHY N/A 08/10/2019   Procedure: LEFT HEART CATH AND CORONARY ANGIOGRAPHY;   Surgeon: Lorretta Harp, MD;  Location: [facility];  Service: Cardiovascular;  Laterality: N/A;  . SHOULDER ARTHROSCOPY WITH SUBACROMIAL DECOMPRESSION AND OPEN ROTATOR C Right 12/26/2012   Procedure: RIGHT SHOULDER ARTHROSCOPY WITH SUBACROMIAL DECOMPRESSION MINI OPEN ROTATOR CUFF REPAIR, OPEN BICEP TENODESIS OPEN DISTAL CLAVICLE RESECTION  ;  Surgeon: Augustin Schooling, MD;  Location: [facility];  Service: Orthopedics;  Laterality: Right;  . TRANSFORAMINAL LUMBAR INTERBODY FUSION (TLIF) WITH PEDICLE SCREW FIXATION 1 LEVEL Right 03/27/2018   Procedure: RIGHT-SIDED LUMBAR 4-5 TRANSFORAMINAL LUMBAR INTERBODY FUSION WITH INSTRUMENTATION AND ALLOGRAFT;  Surgeon: Phylliss Bob, MD;  Location: [facility];  Service: Orthopedics;  Laterality: Right;  . URETEROSCOPY     and stone retreval     No outpatient medications have been marked as taking for the 02/08/20 encounter (Appointment) with Deberah Pelton, NP.     Allergies:   Hydrocodone-acetaminophen and Shrimp [shellfish allergy]   Social History   Tobacco Use  . Smoking status: Former Smoker    Types: Cigarettes    Quit date: 07/10/1998    Years since quitting: 21.5  . Smokeless tobacco: Never Used  Vaping Use  . Vaping Use: Never used  Substance Use Topics  . Alcohol use: Yes    Alcohol/week: 6.0 standard drinks    Types: 6 Cans of beer per week    Comment: 1 6 pack a week  . Drug use: No     Family Hx: The patient's family history includes Diabetes in his mother; Heart disease in his father; Hypertension in his father.  ROS:   Please see the history of present illness.     All other systems reviewed and are negative.   Labs/Other Tests and Data Reviewed:    Recent Labs: 04/29/2019: ALT 45 07/31/2019: BUN 17; Creatinine, Ser 1.12; Hemoglobin 12.9; Platelets 273; Potassium 4.1; Sodium 139   Recent Lipid Panel Lab Results  Component Value Date/Time   CHOL 212 (H) 04/29/2019 03:49 PM   TRIG 205 (H) 04/29/2019 03:49 PM   HDL 66  04/29/2019 03:49 PM   CHOLHDL 3.2 04/29/2019 03:49 PM   CHOLHDL 3 01/24/2012 03:35 PM   LDLCALC 111 (H) 04/29/2019 03:49 PM   LDLDIRECT 107.5 01/24/2012 03:35 PM    Wt Readings from Last 3 Encounters:  09/16/19 250 lb 3.2 oz (113.5 kg)  09/08/19 249 lb (112.9 kg)  08/10/19 252 lb (114.3 kg)     Exam:    Vital Signs:  There were no vitals taken for this visit.   Well nourished, well developed male in no  acute distress.   ASSESSMENT & PLAN:    1.  Nonobstructive coronary artery disease-no chest pain today.  Underwent cardiac catheterization 08/10/2019 which showed a 40% LAD lesion.  Medical management was recommended. Continue amlodipine, aspirin, atorvastatin, carvedilol, losartan Heart healthy low-sodium diet-salty 6 given Increase physical activity as tolerated  Essential hypertension-BP today***.  Well-controlled on Continue carvedilol, losartan, amlodipine Heart healthy low-sodium diet-salty 6 given Increase physical activity as tolerated   Hyperlipidemia-04/29/2019: Cholesterol, Total 212; HDL 66; LDL Chol Calc (NIH) 111; Triglycerides  205 Continue atorvastatin Heart healthy low-sodium high-fiber diet Increase physical activity as tolerated  Disposition: Follow-up with me or Dr. Gwenlyn Found in 6 months.  COVID-19 Education: The signs and symptoms of COVID-19 were discussed with the patient and how to seek care for testing (follow up with PCP or arrange E-visit).  The importance of social distancing was discussed today.  Patient Risk:   After full review of this patients clinical status, I feel that they are at least moderate risk at this time.  Time:   Today, I have spent *** minutes with the patient with telehealth technology discussing ***.     Medication Adjustments/Labs and Tests Ordered: Current medicines are reviewed at length with the patient today.  Concerns regarding medicines are outlined above.   Tests Ordered: No orders of the defined types were placed  in this encounter.  Medication Changes: No orders of the defined types were placed in this encounter.   Disposition:  {follow up:15908}  Signed, Jossie Ng. Antion Andres NP-C    12/09/2018 11:58 AM    [place] [address] Office [phone] Fax [phone]

## 2020-02-10 MED FILL — LOSARTAN POTASSIUM 100 MG T: 100 | 30 days supply | Qty: 30 | Fill #3

## 2020-02-10 NOTE — Telephone Encounter (Signed)
Spoke with pt to review AVS

## 2020-02-11 ENCOUNTER — Other Ambulatory Visit (INDEPENDENT_AMBULATORY_CARE_PROVIDER_SITE_OTHER): Payer: Self-pay | Admitting: Primary Care

## 2020-02-11 ENCOUNTER — Other Ambulatory Visit (INDEPENDENT_AMBULATORY_CARE_PROVIDER_SITE_OTHER): Payer: Self-pay | Admitting: Family Medicine

## 2020-02-11 DIAGNOSIS — Z760 Encounter for issue of repeat prescription: Secondary | ICD-10-CM

## 2020-02-11 MED FILL — OMEPRAZOLE 20 MG CAP: 20 | 30 days supply | Qty: 30 | Fill #0

## 2020-02-11 MED FILL — ATORVASTATIN CALCIUM 40 MG: 40 | 30 days supply | Qty: 30 | Fill #5

## 2020-02-11 MED FILL — AMLODIPINE BESYLATE 10 MG T: 10 | 30 days supply | Qty: 30 | Fill #5

## 2020-02-11 NOTE — Telephone Encounter (Signed)
Approved per protocol.  Requested Prescriptions  Pending Prescriptions Disp Refills  . omeprazole (PRILOSEC) 20 MG capsule [Pharmacy Med Name: OMEPRAZOLE 20 MG CAP 20 Capsule] 30 capsule 2    Sig: TAKE 1 CAPSULE (20 MG TOTAL) BY MOUTH DAILY.     Gastroenterology: Proton Pump Inhibitors Passed - 02/11/2020 12:38 PM      Passed - Valid encounter within last 12 months    Recent Outpatient Visits          5 months ago Elevated glucose   [place] Scot Jun, FNP   6 months ago Essential hypertension   [place], Michelle P, NP   7 months ago Essential hypertension   [facility] [name], NP   9 months ago Hypertension, unspecified type   [place] [name], NP   10 months ago Encounter for medication refill   [place], [place], NP      Future Appointments            In 4 weeks Oletta Lamas [place] Cage, NP [place]

## 2020-03-04 MED FILL — CARVEDILOL 25 MG TABLET: 25 | 30 days supply | Qty: 60 | Fill #5

## 2020-03-07 MED FILL — CARVEDILOL 25 MG TABLET: 25 | 30 days supply | Qty: 60 | Fill #5

## 2020-03-10 ENCOUNTER — Other Ambulatory Visit: Payer: Self-pay

## 2020-03-10 ENCOUNTER — Other Ambulatory Visit (INDEPENDENT_AMBULATORY_CARE_PROVIDER_SITE_OTHER): Payer: Self-pay | Admitting: Primary Care

## 2020-03-10 ENCOUNTER — Encounter (INDEPENDENT_AMBULATORY_CARE_PROVIDER_SITE_OTHER): Payer: Self-pay | Admitting: Primary Care

## 2020-03-10 ENCOUNTER — Ambulatory Visit (INDEPENDENT_AMBULATORY_CARE_PROVIDER_SITE_OTHER): Payer: 59 | Admitting: Primary Care

## 2020-03-10 VITALS — BP 149/94 | HR 86 | Temp 97.5°F | Ht 72.0 in | Wt 247.6 lb

## 2020-03-10 DIAGNOSIS — G43509 Persistent migraine aura without cerebral infarction, not intractable, without status migrainosus: Secondary | ICD-10-CM | POA: Diagnosis not present

## 2020-03-10 DIAGNOSIS — F5101 Primary insomnia: Secondary | ICD-10-CM | POA: Diagnosis not present

## 2020-03-10 MED ORDER — SUMATRIPTAN SUCCINATE 50 MG PO TABS: 50.0000 mg | ORAL_TABLET | ORAL | 0 refills | Status: DC | PRN

## 2020-03-10 MED ORDER — TRAZODONE HCL 100 MG PO TABS: 50.0000 mg | ORAL_TABLET | Freq: Every day | ORAL | 0 refills | Status: DC

## 2020-03-10 MED FILL — TRAZODONE HCL 100 MG TABS: 100 | 90 days supply | Qty: 45 | Fill #0

## 2020-03-10 MED FILL — SUMATRIPTAN SUCC 50 MG TAB: 50 | 30 days supply | Qty: 9 | Fill #0

## 2020-03-10 NOTE — Patient Instructions (Signed)

## 2020-03-10 NOTE — Progress Notes (Signed)
Established Patient Office Visit  Subjective:  Patient ID: [name], male    DOB: [birth year]  Age: 59 y.o. MRN: [id number]  CC:  Chief Complaint  Patient presents with  . Hypertension  . Headache    HPI [name] is a 59 year old male  presents for management of hypertension reviewed chart and is managed by cardiology . Bp is elevated today admits to missing Bp meds occasionally. Main concern is headaches.  Past Medical History:  Diagnosis Date  . Alcohol abuse   . Anxiety   . Arthritis   . Diverticulosis of colon (without mention of hemorrhage)   . GERD (gastroesophageal reflux disease)   . History of kidney stones   . Hyperlipemia   . Hypertension    dr Ronalee Belts  norins  . Kidney stones   . Nephrolithiasis   . Pancreatitis    Hx of   . Ureteral stent displacement ([facility]) 1990    Past Surgical History:  Procedure Laterality Date  . A2 pulley flexor sheath cyst 4th finger excisional biopsy     '03  . ANTERIOR CRUCIATE LIGAMENT REPAIR     R knee  . ESOPHAGOGASTRODUODENOSCOPY N/A 07/09/2012   Procedure: ESOPHAGOGASTRODUODENOSCOPY (EGD);  Surgeon: Inda Castle, MD;  Location: [facility];  Service: Endoscopy;  Laterality: N/A;  . KNEE SURGERY  2008  . LEFT HEART CATH AND CORONARY ANGIOGRAPHY N/A 08/10/2019   Procedure: LEFT HEART CATH AND CORONARY ANGIOGRAPHY;  Surgeon: Lorretta Harp, MD;  Location: [facility];  Service: Cardiovascular;  Laterality: N/A;  . SHOULDER ARTHROSCOPY WITH SUBACROMIAL DECOMPRESSION AND OPEN ROTATOR C Right 12/26/2012   Procedure: RIGHT SHOULDER ARTHROSCOPY WITH SUBACROMIAL DECOMPRESSION MINI OPEN ROTATOR CUFF REPAIR, OPEN BICEP TENODESIS OPEN DISTAL CLAVICLE RESECTION  ;  Surgeon: Augustin Schooling, MD;  Location: [facility];  Service: Orthopedics;  Laterality: Right;  . TRANSFORAMINAL LUMBAR INTERBODY FUSION (TLIF) WITH PEDICLE SCREW FIXATION 1 LEVEL Right 03/27/2018   Procedure: RIGHT-SIDED LUMBAR 4-5 TRANSFORAMINAL LUMBAR  INTERBODY FUSION WITH INSTRUMENTATION AND ALLOGRAFT;  Surgeon: Phylliss Bob, MD;  Location: [facility];  Service: Orthopedics;  Laterality: Right;  . URETEROSCOPY     and stone retreval    Family History  Problem Relation Age of Onset  . Hypertension Father        X4 siblings  . Heart disease Father   . Diabetes Mother        x2 brothers    Social History   Socioeconomic History  . Marital status: Married    Spouse name: Not on file  . Number of children: 3  . Years of education: Not on file  . Highest education level: Not on file  Occupational History    Employer: [employer]    Comment: Grounds Keeper   Tobacco Use  . Smoking status: Former Smoker    Types: Cigarettes    Quit date: 07/10/1998    Years since quitting: 21.6  . Smokeless tobacco: Never Used  Vaping Use  . Vaping Use: Never used  Substance and Sexual Activity  . Alcohol use: Yes    Alcohol/week: 6.0 standard drinks    Types: 6 Cans of beer per week    Comment: 1 6 pack a week  . Drug use: No  . Sexual activity: Yes  Other Topics Concern  . Not on file  Social History Narrative   [employer]   [employer] groundskeeper   Difficult domestic situation, Married '87-  separated '01   2 sons- '82, '91; 1 daughter '91   His niece is 21 with metastatic breast cancer (feb '12)   Social Determinants of Health   Financial Resource Strain:   . Difficulty of Paying Living Expenses: Not on file  Food Insecurity:   . Worried About Charity fundraiser in the Last Year: Not on file  . Ran Out of Food in the Last Year: Not on file  Transportation Needs:   . Lack of Transportation (Medical): Not on file  . Lack of Transportation (Non-Medical): Not on file  Physical Activity:   . Days of Exercise per Week: Not on file  . Minutes of Exercise per Session: Not on file  Stress:   . Feeling of Stress : Not on file  Social Connections:   . Frequency of Communication with Friends and Family: Not on file  . Frequency of Social  Gatherings with Friends and Family: Not on file  . Attends Religious Services: Not on file  . Active Member of Clubs or Organizations: Not on file  . Attends Archivist Meetings: Not on file  . Marital Status: Not on file  Intimate Partner Violence:   . Fear of Current or Ex-Partner: Not on file  . Emotionally Abused: Not on file  . Physically Abused: Not on file  . Sexually Abused: Not on file    Outpatient Medications Prior to Visit  Medication Sig Dispense Refill  . acetaminophen (TYLENOL) 500 MG tablet Take 1,000 mg by mouth every 6 (six) hours as needed for mild pain or moderate pain.    [name] amLODipine (NORVASC) 10 MG tablet Take 1 tablet (10 mg total) by mouth daily. MUST MAKE APPT FOR FURTHER REFILLS 90 tablet 1  . aspirin (ASPIRIN EC) 81 MG EC tablet Take 81 mg by mouth daily. Swallow whole.    . carvedilol (COREG) 25 MG tablet Take 1 tablet (25 mg total) by mouth 2 (two) times daily with a meal. 180 tablet 3  . chlorhexidine (PERIDEX) 0.12 % solution Use as directed 15 mLs in the mouth or throat 2 (two) times daily. 120 mL 0  . FIBER PO Take 1 capsule by mouth daily.    [name] losartan (COZAAR) 100 MG tablet Take 1 tablet (100 mg total) by mouth daily. Must have office visit for refills 90 tablet 1  . Menthol, Topical Analgesic, (BIOFREEZE EX) Apply 1 application topically daily as needed (back pain).    . Multiple Vitamins-Minerals (MULTIVITAMIN WITH MINERALS) tablet Take 1 tablet by mouth daily.    [name] omeprazole (PRILOSEC) 20 MG capsule TAKE 1 CAPSULE (20 MG TOTAL) BY MOUTH DAILY. 30 capsule 2  . Tetrahydrozoline HCl (VISINE OP) Place 1 drop into both eyes daily as needed (redness).    . triamcinolone cream (KENALOG) 0.1 % Apply 1 application topically 2 (two) times daily. 60 g 0  . aspirin-acetaminophen-caffeine (EXCEDRIN MIGRAINE) 250-250-65 MG tablet Take 2 tablets by mouth every 6 (six) hours as needed for headache.    [name] atorvastatin (LIPITOR) 40 MG tablet Take 1 tablet  (40 mg total) by mouth daily. 90 tablet 3   No facility-administered medications prior to visit.    Allergies  Allergen Reactions  . Hydrocodone-Acetaminophen Palpitations  . Shrimp [Shellfish Allergy] Other (See Comments)    Cholesterol level becomes very high. Patient reports sweating and itching.    ROS Review of Systems  Neurological: Positive for headaches.  All other systems reviewed and are negative.  Objective:    Physical Exam Vitals reviewed.  Constitutional:      Appearance: He is well-developed. He is obese.  Cardiovascular:     Rate and Rhythm: Normal rate and regular rhythm.  Pulmonary:     Effort: Pulmonary effort is normal.     Breath sounds: Normal breath sounds.  Abdominal:     General: Bowel sounds are normal.     Palpations: Abdomen is soft.  Musculoskeletal:        General: Normal range of motion.     Cervical back: Normal range of motion and neck supple.  Skin:    General: Skin is warm and dry.  Neurological:     Mental Status: He is alert.  Psychiatric:        Mood and Affect: Mood normal.        Behavior: Behavior normal.     BP (!) 149/94 (BP Location: Left Arm, Patient Position: Sitting, Cuff Size: Large)   Pulse 86   Temp (!) 97.5 F (36.4 C) (Temporal)   Ht 6' (1.829 m)   Wt 247 lb 9.6 oz (112.3 kg)   SpO2 96%   BMI 33.58 kg/m  Wt Readings from Last 3 Encounters:  03/10/20 247 lb 9.6 oz (112.3 kg)  02/08/20 250 lb (113.4 kg)  09/16/19 250 lb 3.2 oz (113.5 kg)     Health Maintenance Due  Topic Date Due  . COVID-19 Vaccine (2 - Pfizer 2-dose series) 02/19/2020    There are no preventive care reminders to display for this patient.  Lab Results  Component Value Date   TSH 0.693 07/10/2016   Lab Results  Component Value Date   WBC 9.8 07/31/2019   HGB 12.9 (L) 07/31/2019   HCT 37.5 07/31/2019   MCV 92 07/31/2019   PLT 273 07/31/2019   Lab Results  Component Value Date   NA 139 07/31/2019   K 4.1 07/31/2019    CO2 21 07/31/2019   GLUCOSE 113 (H) 07/31/2019   BUN 17 07/31/2019   CREATININE 1.12 07/31/2019   BILITOT 0.6 04/29/2019   ALKPHOS 66 04/29/2019   AST 33 04/29/2019   ALT 45 (H) 04/29/2019   PROT 7.4 04/29/2019   ALBUMIN 4.9 04/29/2019   CALCIUM 9.3 07/31/2019   ANIONGAP 12 05/29/2018   GFR 100.87 03/11/2014   Lab Results  Component Value Date   CHOL 212 (H) 04/29/2019   Lab Results  Component Value Date   HDL 66 04/29/2019   Lab Results  Component Value Date   LDLCALC 111 (H) 04/29/2019   Lab Results  Component Value Date   TRIG 205 (H) 04/29/2019   Lab Results  Component Value Date   CHOLHDL 3.2 04/29/2019   Lab Results  Component Value Date   HGBA1C 5.3 09/08/2019      Assessment & Plan:   [name] was seen today for hypertension and headache.  Diagnoses and all orders for this visit:  Persistent migraine aura without cerebral infarction and without status migrainosus, not intractable Patient also complains of migraine for 6 hours.  He localizes his pain to the temporal left side head.  He describes the pain as constant and throbbing in character.  He has tried OTC Excerdrin migraine without relief.  His symptoms are made worse with light   He does not described this as the worst headache of her life. 10/10   Denies fever, chills, nausea, vomiting rhinorrhea, watery eyes, chest pain, SOB, abdominal pain, weakness, numbness or tingling.  Aura light sensitivity and nausea -     Ambulatory referral to Neurology -     SUMAtriptan (IMITREX) 50 MG tablet; Take 1 tablet (50 mg total) by mouth every 2 (two) hours as needed for migraine. May repeat in 2 hours if headache persists or recurs.  -   Primary insomnia Insomnia is frequent trouble falling and/or staying asleep. Insomnia can be a long term problem or a short term problem. Both are common. Insomnia can be a short term problem when the wakefulness is related to a certain stress or worry. Long term insomnia is  often related to ongoing stress during waking hours and/or poor sleeping habits. Overtime, sleep deprivation itself can make the problem worse. Every little thing feels more severe because you are overtired and your ability to cope is decreased.   traZODone (DESYREL) 100 MG tablet; Take 0.5 tablets (50 mg total) by mouth at bedtime.    Meds ordered this encounter  Medications  . SUMAtriptan (IMITREX) 50 MG tablet    Sig: Take 1 tablet (50 mg total) by mouth every 2 (two) hours as needed for migraine. May repeat in 2 hours if headache persists or recurs.    Dispense:  10 tablet    Refill:  0  . traZODone (DESYREL) 100 MG tablet    Sig: Take 0.5 tablets (50 mg total) by mouth at bedtime.    Dispense:  90 tablet    Refill:  0    Follow-up: Return in about 2 months (around 05/10/2020), or if symptoms worsen or fail to improve, for insomnia .    [name], NP

## 2020-03-18 MED FILL — OMEPRAZOLE 20 MG CAP: 20 | 30 days supply | Qty: 30 | Fill #1

## 2020-03-18 MED FILL — LOSARTAN POTASSIUM 100 MG T: 100 | 30 days supply | Qty: 30 | Fill #4

## 2020-03-18 MED FILL — ATORVASTATIN CALCIUM 40 MG: 40 | 30 days supply | Qty: 30 | Fill #6

## 2020-03-18 MED FILL — AMLODIPINE BESYLATE 10 MG T: 10 | 30 days supply | Qty: 30 | Fill #0

## 2020-03-22 ENCOUNTER — Ambulatory Visit: Payer: Self-pay

## 2020-03-22 ENCOUNTER — Other Ambulatory Visit (INDEPENDENT_AMBULATORY_CARE_PROVIDER_SITE_OTHER): Payer: Self-pay | Admitting: Primary Care

## 2020-03-22 DIAGNOSIS — I10 Essential (primary) hypertension: Secondary | ICD-10-CM

## 2020-03-22 MED ORDER — CARVEDILOL 25 MG PO TABS: 25.0000 mg | ORAL_TABLET | Freq: Two times a day (BID) | ORAL | 0 refills | Status: DC

## 2020-03-22 NOTE — Telephone Encounter (Signed)
Summary: COVID Vaccine Shot   Patient last seen 03/10/2020 for head aches, patient states symptoms have improved and would like to know if he should receive the second COVID Vaccine shot. Patient did state still experiencing slight left side head ache, please advise      Pt. Being treated for migraines. States he is feeling better.Advised he can take the COVID 19 vaccination.

## 2020-03-22 NOTE — Telephone Encounter (Signed)
Patient requesting carvedilol (COREG) 25 MG tablet , informed please allow 48 to 72 hour turn around time. Patient contacted pharmacy last week.     [place], [place] [facility] Phone:  [phone]  Fax:  [phone]

## 2020-04-03 ENCOUNTER — Emergency Department (HOSPITAL_COMMUNITY)
Admission: EM | Admit: 2020-04-03 | Discharge: 2020-04-04 | Disposition: A | Discharge status: Home / Self Care | Payer: 59 | Attending: Emergency Medicine | Admitting: Emergency Medicine

## 2020-04-03 ENCOUNTER — Encounter (HOSPITAL_COMMUNITY): Payer: Self-pay

## 2020-04-03 ENCOUNTER — Emergency Department (HOSPITAL_COMMUNITY): Payer: 59

## 2020-04-03 ENCOUNTER — Other Ambulatory Visit: Payer: Self-pay

## 2020-04-03 DIAGNOSIS — Z79899 Other long term (current) drug therapy: Secondary | ICD-10-CM | POA: Insufficient documentation

## 2020-04-03 DIAGNOSIS — Z7982 Long term (current) use of aspirin: Secondary | ICD-10-CM | POA: Diagnosis not present

## 2020-04-03 DIAGNOSIS — Z20822 Contact with and (suspected) exposure to covid-19: Secondary | ICD-10-CM | POA: Diagnosis not present

## 2020-04-03 DIAGNOSIS — I10 Essential (primary) hypertension: Secondary | ICD-10-CM | POA: Diagnosis not present

## 2020-04-03 DIAGNOSIS — R519 Headache, unspecified: Secondary | ICD-10-CM

## 2020-04-03 DIAGNOSIS — N3000 Acute cystitis without hematuria: Secondary | ICD-10-CM

## 2020-04-03 DIAGNOSIS — Z87891 Personal history of nicotine dependence: Secondary | ICD-10-CM | POA: Insufficient documentation

## 2020-04-03 LAB — RESP PANEL BY RT-PCR (FLU A&B, COVID) ARPGX2
Influenza A by PCR: NEGATIVE
Influenza B by PCR: NEGATIVE
SARS Coronavirus 2 by RT PCR: NEGATIVE

## 2020-04-03 LAB — COMPREHENSIVE METABOLIC PANEL
ALT: 19 U/L (ref 0–44)
AST: 17 U/L (ref 15–41)
Albumin: 4.6 g/dL (ref 3.5–5.0)
Alkaline Phosphatase: 36 U/L — ABNORMAL LOW (ref 38–126)
Anion gap: 10 (ref 5–15)
BUN: 12 mg/dL (ref 6–20)
CO2: 26 mmol/L (ref 22–32)
Calcium: 9.5 mg/dL (ref 8.9–10.3)
Chloride: 104 mmol/L (ref 98–111)
Creatinine, Ser: 0.92 mg/dL (ref 0.61–1.24)
GFR, Estimated: >60 mL/min (ref >60)
Glucose, Bld: 129 mg/dL — ABNORMAL HIGH (ref 70–99)
Potassium: 3.4 mmol/L — ABNORMAL LOW (ref 3.5–5.1)
Sodium: 140 mmol/L (ref 135–145)
Total Bilirubin: 0.7 mg/dL (ref 0.3–1.2)
Total Protein: 7.9 g/dL (ref 6.5–8.1)

## 2020-04-03 LAB — CBC WITH DIFFERENTIAL/PLATELET
Abs Immature Granulocytes: 0.04 10*3/uL (ref 0.00–0.07)
Basophils Absolute: 0.1 10*3/uL (ref 0.0–0.1)
Basophils Relative: 1 %
Eosinophils Absolute: 0.2 10*3/uL (ref 0.0–0.5)
Eosinophils Relative: 1 %
HCT: 39.1 % (ref 39.0–52.0)
Hemoglobin: 12.7 g/dL — ABNORMAL LOW (ref 13.0–17.0)
Immature Granulocytes: 0 %
Lymphocytes Relative: 26 %
Lymphs Abs: 3.7 10*3/uL (ref 0.7–4.0)
MCH: 30.8 pg (ref 26.0–34.0)
MCHC: 32.5 g/dL (ref 30.0–36.0)
MCV: 94.7 fL (ref 80.0–100.0)
Monocytes Absolute: 1.2 10*3/uL — ABNORMAL HIGH (ref 0.1–1.0)
Monocytes Relative: 9 %
Neutro Abs: 8.9 10*3/uL — ABNORMAL HIGH (ref 1.7–7.7)
Neutrophils Relative %: 63 %
Platelets: 307 10*3/uL (ref 150–400)
RBC: 4.13 MIL/uL — ABNORMAL LOW (ref 4.22–5.81)
RDW: 12.1 % (ref 11.5–15.5)
WBC: 14.1 10*3/uL — ABNORMAL HIGH (ref 4.0–10.5)
nRBC: 0 % (ref 0.0–0.2)

## 2020-04-03 LAB — URINALYSIS, ROUTINE W REFLEX MICROSCOPIC
Bilirubin Urine: NEGATIVE
Glucose, UA: NEGATIVE mg/dL
Hgb urine dipstick: NEGATIVE
Ketones, ur: NEGATIVE mg/dL
Nitrite: NEGATIVE
Protein, ur: NEGATIVE mg/dL
Specific Gravity, Urine: 1.005 (ref 1.005–1.030)
pH: 6 (ref 5.0–8.0)

## 2020-04-03 LAB — SEDIMENTATION RATE: Sed Rate: 30 mm/hr — ABNORMAL HIGH (ref 0–16)

## 2020-04-03 LAB — PROTIME-INR
INR: 1 (ref 0.8–1.2)
Prothrombin Time: 13.1 seconds (ref 11.4–15.2)

## 2020-04-03 LAB — TSH: TSH: 1.173 u[IU]/mL (ref 0.350–4.500)

## 2020-04-03 MED ORDER — SODIUM CHLORIDE (PF) 0.9 % IJ SOLN
INTRAMUSCULAR | Status: AC
  Filled 2020-04-03: qty 50

## 2020-04-03 MED ORDER — METOCLOPRAMIDE HCL 5 MG/ML IJ SOLN
10.0000 mg | Freq: Once | INTRAMUSCULAR | Status: AC
  Administered 2020-04-03: 10 mg via INTRAVENOUS
  Filled 2020-04-03: qty 2

## 2020-04-03 MED ORDER — LACTATED RINGERS IV BOLUS
500.0000 mL | Freq: Once | INTRAVENOUS | Status: AC
  Administered 2020-04-03: 500 mL via INTRAVENOUS

## 2020-04-03 MED ORDER — IOHEXOL 350 MG/ML SOLN
80.0000 mL | Freq: Once | INTRAVENOUS | Status: AC | PRN
  Administered 2020-04-03: 80 mL via INTRAVENOUS

## 2020-04-03 MED ORDER — DIPHENHYDRAMINE HCL 50 MG/ML IJ SOLN
25.0000 mg | Freq: Once | INTRAMUSCULAR | Status: AC
  Administered 2020-04-03: 25 mg via INTRAVENOUS
  Filled 2020-04-03: qty 1

## 2020-04-03 MED ORDER — CEPHALEXIN 500 MG PO CAPS
1000.0000 mg | ORAL_CAPSULE | Freq: Once | ORAL | Status: AC
  Administered 2020-04-03: 1000 mg via ORAL
  Filled 2020-04-03: qty 2

## 2020-04-03 NOTE — ED Notes (Signed)
Pt reports being lightheadness and dizziness upon changing positions and light sensitivity.

## 2020-04-03 NOTE — ED Triage Notes (Signed)
Pt presents with c/o headache. Pt reports he initially went to his MD for the headache a few weeks ago and was given medication for migraines and for sleep. Pt reports the medication does not seem to be working.

## 2020-04-03 NOTE — ED Provider Notes (Signed)
[facility] DEPT Provider Note   CSN: [id number] Arrival date & time: 04/03/20  1654     History Chief Complaint  Patient presents with  . Headache    [name] is a 59 y.o. male.  HPI Patient reports has had a headache for several weeks.  He reports often it is left-sided.  However, he also is experiencing a generalized headache.  He has experienced light sensitivity of the eyes.  He has not had any fever or chills.  No body aches or general weakness.  No nausea or vomiting.  No double vision or blurred vision.  Patient reports that in the past if he occasionally got a headache it will go away with some over-the-counter medications.  He reports his headache has been persistent.  He was seen by his PCP and given Imitrex to try.  He reports that Imitrex initially worked a little bit and gave him an hour so relief of the headache would come back again.  He reports now the Imitrex does not seem to be helping at all.  Patient reports that he did try drinking some caffeine and that also gave some mild relief but the headache keeps rebounding.  Patient has been vaccinated for Covid.  He has not had any new medications or medication changes aside from being started on the Imitrex and nighttime trazodone for sleep.  Patient reports he does have a referral to neurology but it is not until January.  He reports that this headache is persisting and worsening and he is concerned about possibly having an aneurysm.  Patient denies sinus symptoms with congestion drainage or sore throat.  He reports he does have problems with his gums and has been prescribed Peridex but does not use it daily.    Past Medical History:  Diagnosis Date  . Alcohol abuse   . Anxiety   . Arthritis   . Diverticulosis of colon (without mention of hemorrhage)   . GERD (gastroesophageal reflux disease)   . History of kidney stones   . Hyperlipemia   . Hypertension    dr Ronalee Belts  norins  . Kidney  stones   . Nephrolithiasis   . Pancreatitis    Hx of   . Ureteral stent displacement [facility]) 1990    Patient Active Problem List   Diagnosis Date Noted  . Chest pain of uncertain etiology 02/63/7858  . Alcoholic pancreatitis 85/06/7739  . Normal anion gap metabolic acidosis 28/78/6767  . Radiculopathy 03/27/2018  . 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/02/2017  . Renal stone 10/08/2016  . Non-compliant behavior 12/02/2013  . Routine health maintenance 01/27/2012  . Hypertriglyceridemia 01/27/2012  . Knee pain, left 11/12/2011  . BPH (benign prostatic hyperplasia) 11/12/2011  . GERD 08/10/2008  . Essential hypertension 04/14/2008  . Alcohol abuse 10/26/2007  . TRIGGER FINGER, LEFT MIDDLE 05/12/2007  . NEPHROLITHIASIS, HX OF 05/12/2007    Past Surgical History:  Procedure Laterality Date  . A2 pulley flexor sheath cyst 4th finger excisional biopsy     '03  . ANTERIOR CRUCIATE LIGAMENT REPAIR     R knee  . ESOPHAGOGASTRODUODENOSCOPY N/A 07/09/2012   Procedure: ESOPHAGOGASTRODUODENOSCOPY (EGD);  Surgeon: Inda Castle, MD;  Location: [facility];  Service: Endoscopy;  Laterality: N/A;  . KNEE SURGERY  2008  . LEFT HEART CATH AND CORONARY ANGIOGRAPHY N/A 08/10/2019   Procedure: LEFT HEART CATH AND CORONARY ANGIOGRAPHY;  Surgeon: Lorretta Harp, MD;  Location: [facility];  Service: Cardiovascular;  Laterality: N/A;  . SHOULDER ARTHROSCOPY WITH SUBACROMIAL DECOMPRESSION AND OPEN ROTATOR C Right 12/26/2012   Procedure: RIGHT SHOULDER ARTHROSCOPY WITH SUBACROMIAL DECOMPRESSION MINI OPEN ROTATOR CUFF REPAIR, OPEN BICEP TENODESIS OPEN DISTAL CLAVICLE RESECTION  ;  Surgeon: Augustin Schooling, MD;  Location: [facility];  Service: Orthopedics;  Laterality: Right;  . TRANSFORAMINAL LUMBAR INTERBODY FUSION (TLIF) WITH PEDICLE SCREW FIXATION 1 LEVEL Right 03/27/2018   Procedure: RIGHT-SIDED LUMBAR 4-5 TRANSFORAMINAL LUMBAR INTERBODY FUSION WITH INSTRUMENTATION AND ALLOGRAFT;  Surgeon:  Phylliss Bob, MD;  Location: [facility];  Service: Orthopedics;  Laterality: Right;  . URETEROSCOPY     and stone retreval       Family History  Problem Relation Age of Onset  . Hypertension Father        X4 siblings  . Heart disease Father   . Diabetes Mother        x2 brothers    Social History   Tobacco Use  . Smoking status: Former Smoker    Types: Cigarettes    Quit date: 07/10/1998    Years since quitting: 21.7  . Smokeless tobacco: Never Used  Vaping Use  . Vaping Use: Never used  Substance Use Topics  . Alcohol use: Yes    Alcohol/week: 6.0 standard drinks    Types: 6 Cans of beer per week    Comment: 1 6 pack a week  . Drug use: No    Home Medications Prior to Admission medications   Medication Sig Start Date End Date Taking? Authorizing Provider  acetaminophen (TYLENOL) 500 MG tablet Take 1,000 mg by mouth every 6 (six) hours as needed for mild pain or moderate pain.    [provider]  acetaminophen (TYLENOL) 500 MG tablet Take 2 tablets (1,000 mg total) by mouth every 6 (six) hours as needed. 04/04/20   Charlesetta Shanks, MD  amLODipine (NORVASC) 10 MG tablet Take 1 tablet (10 mg total) by mouth daily. MUST MAKE APPT FOR FURTHER REFILLS 09/08/19   Scot Jun, FNP  aspirin (ASPIRIN EC) 81 MG EC tablet Take 81 mg by mouth daily. Swallow whole.    [provider]  atorvastatin (LIPITOR) 40 MG tablet Take 1 tablet (40 mg total) by mouth daily. 09/16/19 02/08/20  Lorretta Harp, MD  carvedilol (COREG) 25 MG tablet Take 1 tablet (25 mg total) by mouth 2 (two) times daily with a meal. 03/22/20   [name], NP  cephALEXin (KEFLEX) 500 MG capsule Take 2 capsules (1,000 mg total) by mouth 2 (two) times daily. 04/04/20   Charlesetta Shanks, MD  chlorhexidine (PERIDEX) 0.12 % solution Use as directed 15 mLs in the mouth or throat 2 (two) times daily. 01/14/20   [name], NP  diphenhydrAMINE (BENADRYL) 25 MG tablet Take 1 tablet (25 mg  total) by mouth every 6 (six) hours as needed. 04/04/20   Charlesetta Shanks, MD  FIBER PO Take 1 capsule by mouth daily.    [provider]  losartan (COZAAR) 100 MG tablet Take 1 tablet (100 mg total) by mouth daily. Must have office visit for refills 09/08/19   Scot Jun, FNP  Menthol, Topical Analgesic, (BIOFREEZE EX) Apply 1 application topically daily as needed (back pain).    [provider]  metoCLOPramide (REGLAN) 10 MG tablet Take 1 tablet (10 mg total) by mouth every 6 (six) hours as needed for nausea (nausea/headache). Take this tablet with 25 mg of Benadryl and two extra strength Tylenol for headache. 04/04/20  Charlesetta Shanks, MD  Multiple Vitamins-Minerals (MULTIVITAMIN WITH MINERALS) tablet Take 1 tablet by mouth daily.    [provider]  omeprazole (PRILOSEC) 20 MG capsule TAKE 1 CAPSULE (20 MG TOTAL) BY MOUTH DAILY. 02/11/20   [name], NP  SUMAtriptan (IMITREX) 50 MG tablet Take 1 tablet (50 mg total) by mouth every 2 (two) hours as needed for migraine. May repeat in 2 hours if headache persists or recurs. 03/10/20   [name], NP  Tetrahydrozoline HCl (VISINE OP) Place 1 drop into both eyes daily as needed (redness).    [provider]  traZODone (DESYREL) 100 MG tablet Take 0.5 tablets (50 mg total) by mouth at bedtime. 03/10/20   [name], NP  triamcinolone cream (KENALOG) 0.1 % Apply 1 application topically 2 (two) times daily. 09/08/19   Scot Jun, FNP    Allergies    Hydrocodone-acetaminophen and Shrimp [shellfish allergy]  Review of Systems   Review of Systems 10 systems reviewed and negative except as per HPI Physical Exam Updated Vital Signs BP (!) 145/130   Pulse 77   Temp 98 F (36.7 C) (Oral)   Resp 16   Ht 6' (1.829 m)   Wt 111.1 kg   SpO2 94%   BMI 33.23 kg/m   Physical Exam Constitutional:      Comments: Alert nontoxic clinically well in appearance.  HENT:     Head:  Normocephalic and atraumatic.     Comments: No rashes or lesions on the head or scalp.  Patient has a shaved head so scalp was easily examined.  Mildly tender over the left temple.  No sinus tenderness.  No rashes.    Ears:     Comments: Patient does have some cerumen in each ear canal but the TMs are visible without erythema or bulging.    Nose: Nose normal.     Mouth/Throat:     Comments: Posterior airway is widely patent.  Patient has had dental work but does have areas of significant decay.  He does have gum hypertrophy and several areas.  Probable gingivitis. Eyes:     Extraocular Movements: Extraocular movements intact.     Conjunctiva/sclera: Conjunctivae normal.     Pupils: Pupils are equal, round, and reactive to light.  Cardiovascular:     Rate and Rhythm: Normal rate and regular rhythm.  Pulmonary:     Effort: Pulmonary effort is normal.     Breath sounds: Normal breath sounds.  Abdominal:     General: There is no distension.     Palpations: Abdomen is soft.     Tenderness: There is no abdominal tenderness. There is no guarding.  Musculoskeletal:        General: No swelling or tenderness. Normal range of motion.     Cervical back: Neck supple. No rigidity.     Right lower leg: No edema.     Left lower leg: No edema.  Lymphadenopathy:     Cervical: No cervical adenopathy.  Skin:    General: Skin is warm and dry.     Findings: No rash.  Neurological:     General: No focal deficit present.     Mental Status: He is oriented to person, place, and time.     Cranial Nerves: No cranial nerve deficit.     Sensory: No sensory deficit.     Motor: No weakness.     Coordination: Coordination normal.  Psychiatric:        Mood  and Affect: Mood normal.     ED Results / Procedures / Treatments   Labs (all labs ordered are listed, but only abnormal results are displayed) Labs Reviewed  COMPREHENSIVE METABOLIC PANEL - Abnormal; Notable for the following components:      Result  Value   Potassium 3.4 (*)    Glucose, Bld 129 (*)    Alkaline Phosphatase 36 (*)    All other components within normal limits  CBC WITH DIFFERENTIAL/PLATELET - Abnormal; Notable for the following components:   WBC 14.1 (*)    RBC 4.13 (*)    Hemoglobin 12.7 (*)    Neutro Abs 8.9 (*)    Monocytes Absolute 1.2 (*)    All other components within normal limits  URINALYSIS, ROUTINE W REFLEX MICROSCOPIC - Abnormal; Notable for the following components:   Leukocytes,Ua LARGE (*)    Bacteria, UA MANY (*)    All other components within normal limits  SEDIMENTATION RATE - Abnormal; Notable for the following components:   Sed Rate 30 (*)    All other components within normal limits  RESP PANEL BY RT-PCR (FLU A&B, COVID) ARPGX2  URINE CULTURE  PROTIME-INR  TSH    EKG None  Radiology CT Angio Head W/Cm &/Or Wo Cm  Result Date: 04/03/2020 CLINICAL DATA:  Headache with dizziness EXAM: CT ANGIOGRAPHY HEAD AND NECK TECHNIQUE: Multidetector CT imaging of the head and neck was performed using the standard protocol during bolus administration of intravenous contrast. Multiplanar CT image reconstructions and MIPs were obtained to evaluate the vascular anatomy. Carotid stenosis measurements (when applicable) are obtained utilizing NASCET criteria, using the distal internal carotid diameter as the denominator. CONTRAST:  106mL OMNIPAQUE IOHEXOL 350 MG/ML SOLN COMPARISON:  None. FINDINGS: CTA NECK FINDINGS SKELETON: There is no bony spinal canal stenosis. No lytic or blastic lesion. OTHER NECK: Normal pharynx, larynx and major salivary glands. No cervical lymphadenopathy. Unremarkable thyroid gland. UPPER CHEST: No pneumothorax or pleural effusion. No nodules or masses. AORTIC ARCH: There is no calcific atherosclerosis of the aortic arch. There is no aneurysm, dissection or hemodynamically significant stenosis of the visualized portion of the aorta. Conventional 3 vessel aortic branching pattern. The  visualized proximal subclavian arteries are widely patent. RIGHT CAROTID SYSTEM: Normal without aneurysm, dissection or stenosis. LEFT CAROTID SYSTEM: Normal without aneurysm, dissection or stenosis. VERTEBRAL ARTERIES: Codominant configuration. Both origins are clearly patent. There is no dissection, occlusion or flow-limiting stenosis to the skull base (V1-V3 segments). CTA HEAD FINDINGS POSTERIOR CIRCULATION: --Vertebral arteries: Normal V4 segments. --Inferior cerebellar arteries: Normal. --Basilar artery: Normal. --Superior cerebellar arteries: Normal. --Posterior cerebral arteries (PCA): Normal. ANTERIOR CIRCULATION: --Intracranial internal carotid arteries: Normal. --Anterior cerebral arteries (ACA): Normal. Both A1 segments are present. Patent anterior communicating artery (a-comm). --Middle cerebral arteries (MCA): Normal. VENOUS SINUSES: As permitted by contrast timing, patent. ANATOMIC VARIANTS: None Review of the MIP images confirms the above findings. IMPRESSION: Normal CTA of the head and neck. Electronically Signed   By: Ulyses Jarred M.D.   On: 04/03/2020 23:39   CT Head Wo Contrast  Result Date: 04/03/2020 CLINICAL DATA:  Headache EXAM: CT HEAD WITHOUT CONTRAST TECHNIQUE: Contiguous axial images were obtained from the base of the skull through the vertex without intravenous contrast. COMPARISON:  CT head dated 02/18/2009 FINDINGS: Brain: No evidence of acute infarction, hemorrhage, hydrocephalus, extra-axial collection or mass lesion/mass effect. Vascular: There are vascular calcifications in the carotid siphons. Skull: Normal. Negative for fracture or focal lesion. Sinuses/Orbits: No acute finding. Other: None. IMPRESSION:  No acute intracranial process. Electronically Signed   By: Zerita Boers M.D.   On: 04/03/2020 19:51   CT Angio Neck W and/or Wo Contrast  Result Date: 04/03/2020 CLINICAL DATA:  Headache with dizziness EXAM: CT ANGIOGRAPHY HEAD AND NECK TECHNIQUE: Multidetector CT  imaging of the head and neck was performed using the standard protocol during bolus administration of intravenous contrast. Multiplanar CT image reconstructions and MIPs were obtained to evaluate the vascular anatomy. Carotid stenosis measurements (when applicable) are obtained utilizing NASCET criteria, using the distal internal carotid diameter as the denominator. CONTRAST:  47mL OMNIPAQUE IOHEXOL 350 MG/ML SOLN COMPARISON:  None. FINDINGS: CTA NECK FINDINGS SKELETON: There is no bony spinal canal stenosis. No lytic or blastic lesion. OTHER NECK: Normal pharynx, larynx and major salivary glands. No cervical lymphadenopathy. Unremarkable thyroid gland. UPPER CHEST: No pneumothorax or pleural effusion. No nodules or masses. AORTIC ARCH: There is no calcific atherosclerosis of the aortic arch. There is no aneurysm, dissection or hemodynamically significant stenosis of the visualized portion of the aorta. Conventional 3 vessel aortic branching pattern. The visualized proximal subclavian arteries are widely patent. RIGHT CAROTID SYSTEM: Normal without aneurysm, dissection or stenosis. LEFT CAROTID SYSTEM: Normal without aneurysm, dissection or stenosis. VERTEBRAL ARTERIES: Codominant configuration. Both origins are clearly patent. There is no dissection, occlusion or flow-limiting stenosis to the skull base (V1-V3 segments). CTA HEAD FINDINGS POSTERIOR CIRCULATION: --Vertebral arteries: Normal V4 segments. --Inferior cerebellar arteries: Normal. --Basilar artery: Normal. --Superior cerebellar arteries: Normal. --Posterior cerebral arteries (PCA): Normal. ANTERIOR CIRCULATION: --Intracranial internal carotid arteries: Normal. --Anterior cerebral arteries (ACA): Normal. Both A1 segments are present. Patent anterior communicating artery (a-comm). --Middle cerebral arteries (MCA): Normal. VENOUS SINUSES: As permitted by contrast timing, patent. ANATOMIC VARIANTS: None Review of the MIP images confirms the above findings.  IMPRESSION: Normal CTA of the head and neck. Electronically Signed   By: Ulyses Jarred M.D.   On: 04/03/2020 23:39    Procedures Procedures (including critical care time)  Medications Ordered in ED Medications  sodium chloride (PF) 0.9 % injection (has no administration in time range)  lactated ringers bolus 500 mL (0 mLs Intravenous Stopped 04/03/20 2158)  metoCLOPramide (REGLAN) injection 10 mg (10 mg Intravenous Given 04/03/20 2115)  diphenhydrAMINE (BENADRYL) injection 25 mg (25 mg Intravenous Given 04/03/20 2114)  iohexol (OMNIPAQUE) 350 MG/ML injection 80 mL (80 mLs Intravenous Contrast Given 04/03/20 2306)  cephALEXin (KEFLEX) capsule 1,000 mg (1,000 mg Oral Given 04/03/20 2325)    ED Course  I have reviewed the triage vital signs and the nursing notes.  Pertinent labs & imaging results that were available during my care of the patient were reviewed by me and considered in my medical decision making (see chart for details).    MDM Rules/Calculators/A&P                          Patient presents as aligned above.  He has had now several weeks of persistent headache.  He does describe the headache as being more one-sided although currently it is generalized.  Patient did have concern for possible aneurysm.  With headache onset several weeks ago sounding fairly severe I had concern as well for possible aneurysm with previous sentinel bleed.  Noncontrast shows no blood and CT angios shows no aneurysm.  His neurologic exam is normal.  No signs of meningitis.  Patient has not had fever.  Incidentally, urinalysis is grossly positive for UTI.  Patient has not been sexually active  for a year.  No apparent risk factors for STD.  At this time, will opt to treat possibility of headache associated with general illness stemming from UTI.  Patient also counseled to daily use his Peridex.  He does have significant gingivitis and some dental decay.  However there is no facial swelling or focus to  suggest abscess.  At this time patient's mental status is clear.  Patient has hypertension but blood pressures appear to be relatively well controlled.  He is instructed on monitoring blood pressures and keeping a journal.  Return precautions and follow-up plan discussed. Final Clinical Impression(s) / ED Diagnoses Final diagnoses:  Bad headache  Acute cystitis without hematuria  Essential hypertension    Rx / DC Orders ED Discharge Orders         Ordered    cephALEXin (KEFLEX) 500 MG capsule  2 times daily        04/04/20 0015    metoCLOPramide (REGLAN) 10 MG tablet  Every 6 hours PRN        04/04/20 0015    diphenhydrAMINE (BENADRYL) 25 MG tablet  Every 6 hours PRN        04/04/20 0015    acetaminophen (TYLENOL) 500 MG tablet  Every 6 hours PRN        04/04/20 0015           Charlesetta Shanks, MD 04/04/20 [phone]

## 2020-04-04 ENCOUNTER — Other Ambulatory Visit (HOSPITAL_COMMUNITY): Payer: Self-pay | Admitting: Emergency Medicine

## 2020-04-04 MED ORDER — DIPHENHYDRAMINE HCL 25 MG PO TABS: 25.0000 mg | ORAL_TABLET | Freq: Four times a day (QID) | ORAL | 0 refills | Status: DC | PRN

## 2020-04-04 MED ORDER — METOCLOPRAMIDE HCL 10 MG PO TABS: 10.0000 mg | ORAL_TABLET | Freq: Four times a day (QID) | ORAL | 0 refills | Status: DC | PRN

## 2020-04-04 MED ORDER — ACETAMINOPHEN 500 MG PO TABS: 1000.0000 mg | ORAL_TABLET | Freq: Four times a day (QID) | ORAL | 0 refills | Status: DC | PRN

## 2020-04-04 MED ORDER — CEPHALEXIN 500 MG PO CAPS: 1000.0000 mg | ORAL_CAPSULE | Freq: Two times a day (BID) | ORAL | 0 refills | Status: DC

## 2020-04-04 MED FILL — CARVEDILOL 25 MG TABLET: 25 | 30 days supply | Qty: 60 | Fill #6

## 2020-04-04 MED FILL — CEPHALEXIN 500 MG CAPSULE: 500 | 7 days supply | Qty: 28 | Fill #0

## 2020-04-04 MED FILL — METOCLOPRAMIDE 10 MG TABLET: 10 | 3 days supply | Qty: 12 | Fill #0

## 2020-04-04 NOTE — Discharge Instructions (Addendum)
1.  You had test done in the emergency department today to make sure you do not have an aneurysm or other immediate life-threatening cause for your headache.  If your headache persist despite treatment, you may need referral to a neurologist for further evaluation. 2.  You do have a urinary tract infection.  You are being placed on an antibiotic called Keflex.  Take this twice daily as prescribed.  You should have a recheck with your doctor to make sure signs of infection have cleared after completing your antibiotics. 3.  Take all of your blood pressure medications as prescribed.  Measure your blood pressure three times a day and keep a journal.  If your blood pressures remain elevated you need adjustments with your medications. Four.  Return to the emergency department if you develop new or concerning symptoms.  Return for problems or changes in your vision, worsening headache, fever, weakness numbness or tingling in your extremities or difficulty with your balance.

## 2020-04-06 LAB — URINE CULTURE: Culture: >=100000 — AB

## 2020-04-07 ENCOUNTER — Telehealth: Payer: Self-pay

## 2020-04-07 NOTE — Telephone Encounter (Signed)
Post ED Visit - Positive Culture Follow-up  Culture report reviewed by antimicrobial stewardship pharmacist: [facility] Team []  Elenor Quinones, Pharm.D. []  Heide Guile, Pharm.D., BCPS AQ-ID []  Parks Neptune, Pharm.D., BCPS []  Alycia Rossetti, Pharm.D., BCPS []  [place], Pharm.D., BCPS, AAHIVP []  Legrand Como, Pharm.D., BCPS, AAHIVP []  Salome Arnt, PharmD, BCPS []  Johnnette Gourd, PharmD, BCPS []  Hughes Better, PharmD, BCPS []  Leeroy Cha, PharmD []  Laqueta Linden, PharmD, BCPS []  Albertina Parr, PharmD  [facility] Team []  Leodis Sias, PharmD []  Lindell Spar, PharmD []  Royetta Asal, PharmD []  Graylin Shiver, Rph []  Rema Fendt) Glennon Mac, PharmD []  Arlyn Dunning, PharmD []  Netta Cedars, PharmD []  Dia Sitter, PharmD []  Leone Haven, PharmD []  Gretta Arab, PharmD []  Theodis Shove, PharmD []  Peggyann Juba, PharmD [x]  Reuel Boom, PharmD   Positive urine culture Treated with Cephalexin, organism sensitive to the same and no further patient follow-up is required at this time.  Genia Del 04/07/2020, 11:36 AM

## 2020-04-18 MED FILL — ATORVASTATIN CALCIUM 40 MG: 40 | 30 days supply | Qty: 30 | Fill #7

## 2020-04-18 MED FILL — AMLODIPINE BESYLATE 10 MG T: 10 | 30 days supply | Qty: 30 | Fill #1

## 2020-04-18 MED FILL — OMEPRAZOLE 20 MG CAP: 20 | 30 days supply | Qty: 30 | Fill #2

## 2020-04-18 MED FILL — LOSARTAN POTASSIUM 100 MG T: 100 | 30 days supply | Qty: 30 | Fill #5

## 2020-04-25 ENCOUNTER — Other Ambulatory Visit (INDEPENDENT_AMBULATORY_CARE_PROVIDER_SITE_OTHER): Payer: Self-pay | Admitting: Primary Care

## 2020-04-25 NOTE — Telephone Encounter (Signed)
Medication Refill - Medication: metoCLOPramide (REGLAN) 10 MG tablet (Patient is still experieicing the migraines and is completely out of medication and would like request  Expeditied.)  Has the patient contacted their pharmacy?yes (Agent: If no, request that the patient contact the pharmacy for the refill.) (Agent: If yes, when and what did the pharmacy advise?)Contact PCP  Preferred Pharmacy (with phone number or street name):  [facility] [place] [address] Phone:  [phone]  Fax:  [phone]       Agent: Please be advised that RX refills may take up to 3 business days. We ask that you follow-up with your pharmacy.

## 2020-04-27 ENCOUNTER — Other Ambulatory Visit (INDEPENDENT_AMBULATORY_CARE_PROVIDER_SITE_OTHER): Payer: Self-pay | Admitting: Primary Care

## 2020-04-27 MED ORDER — METOCLOPRAMIDE HCL 10 MG PO TABS: 10.0000 mg | ORAL_TABLET | Freq: Four times a day (QID) | ORAL | 0 refills | Status: DC | PRN

## 2020-04-28 MED FILL — METOCLOPRAMIDE 10 MG TABLET: 10 | 3 days supply | Qty: 12 | Fill #0

## 2020-05-10 ENCOUNTER — Telehealth (INDEPENDENT_AMBULATORY_CARE_PROVIDER_SITE_OTHER): Payer: 59 | Admitting: Primary Care

## 2020-05-10 ENCOUNTER — Other Ambulatory Visit (INDEPENDENT_AMBULATORY_CARE_PROVIDER_SITE_OTHER): Payer: Self-pay | Admitting: Primary Care

## 2020-05-10 DIAGNOSIS — Z760 Encounter for issue of repeat prescription: Secondary | ICD-10-CM

## 2020-05-10 MED FILL — LOSARTAN POTASSIUM 100 MG T: 100 | 30 days supply | Qty: 30 | Fill #0

## 2020-05-10 MED FILL — ATORVASTATIN CALCIUM 40 MG: 40 | 30 days supply | Qty: 30 | Fill #8

## 2020-05-10 MED FILL — CARVEDILOL 25 MG TABLET: 25 | 30 days supply | Qty: 60 | Fill #7

## 2020-05-10 MED FILL — AMLODIPINE BESYLATE 10 MG T: 10 | 30 days supply | Qty: 30 | Fill #2

## 2020-05-12 MED FILL — OMEPRAZOLE 20 MG CAP: 20 | 30 days supply | Qty: 30 | Fill #0

## 2020-05-27 ENCOUNTER — Ambulatory Visit: Payer: Self-pay | Admitting: Diagnostic Neuroimaging

## 2020-06-17 ENCOUNTER — Other Ambulatory Visit (INDEPENDENT_AMBULATORY_CARE_PROVIDER_SITE_OTHER): Payer: Self-pay | Admitting: Primary Care

## 2020-06-17 ENCOUNTER — Other Ambulatory Visit: Payer: Self-pay

## 2020-06-17 ENCOUNTER — Ambulatory Visit (INDEPENDENT_AMBULATORY_CARE_PROVIDER_SITE_OTHER): Payer: Self-pay | Admitting: *Deleted

## 2020-06-17 ENCOUNTER — Ambulatory Visit
Admission: EM | Admit: 2020-06-17 | Discharge: 2020-06-17 | Disposition: A | Discharge status: Home / Self Care | Payer: 59 | Attending: Internal Medicine | Admitting: Internal Medicine

## 2020-06-17 DIAGNOSIS — I10 Essential (primary) hypertension: Secondary | ICD-10-CM

## 2020-06-17 DIAGNOSIS — Z760 Encounter for issue of repeat prescription: Secondary | ICD-10-CM

## 2020-06-17 DIAGNOSIS — E781 Pure hyperglyceridemia: Secondary | ICD-10-CM

## 2020-06-17 DIAGNOSIS — N10 Acute tubulo-interstitial nephritis: Secondary | ICD-10-CM | POA: Insufficient documentation

## 2020-06-17 LAB — POCT URINALYSIS DIP (MANUAL ENTRY)
Glucose, UA: NEGATIVE mg/dL
Nitrite, UA: POSITIVE — AB
Protein Ur, POC: 100 mg/dL — AB
Spec Grav, UA: >=1.03 — AB (ref 1.010–1.025)
Urobilinogen, UA: 0.2 E.U./dL
pH, UA: 5.5 (ref 5.0–8.0)

## 2020-06-17 MED ORDER — ONDANSETRON 4 MG PO TBDP: 4.0000 mg | ORAL_TABLET | Freq: Three times a day (TID) | ORAL | 0 refills | Status: DC | PRN

## 2020-06-17 MED ORDER — CARVEDILOL 25 MG PO TABS: 25.0000 mg | ORAL_TABLET | Freq: Two times a day (BID) | ORAL | 0 refills | Status: DC

## 2020-06-17 MED ORDER — CIPROFLOXACIN HCL 500 MG PO TABS: 500.0000 mg | ORAL_TABLET | Freq: Two times a day (BID) | ORAL | 0 refills | Status: DC

## 2020-06-17 MED ORDER — IBUPROFEN 600 MG PO TABS: 600.0000 mg | ORAL_TABLET | Freq: Four times a day (QID) | ORAL | 0 refills | Status: DC | PRN

## 2020-06-17 NOTE — Telephone Encounter (Signed)
The pt called with complaints of right lower abdominal pain that radiates to his back; he rates his pain at 7-10 out of 10; he took Tyelenol 500 mg x 2 tablets PM 06/16/20 and 06/17/20 at 0945; he has also applied a heating pad on the site; his pain is cotstant; the pt says his urine is cloudy and dark; the pt also says he has a history of kidney stones and feels like that is what is going on; recommendations made per nurse triage protocol; he verbalized understanding but does not want to go to the ED; the ptt is seen at [facility] by Juluis Mire; contacted [facility] at the office; she says per Juluis Mire the pt should be evaluated at urgent care; pt notified and verbalized understanding.  Reason for Disposition . [1] SEVERE pain (e.g., excruciating) AND [2] present > 1 hour  Answer Assessment - Initial Assessment Questions 1. LOCATION: "Where does it hurt?"      Right lower abdomen 2. RADIATION: "Does the pain shoot anywhere else?" (e.g., chest, back)     back 3. ONSET: "When did the pain begin?" (Minutes, hours or days ago)   2/1/0/22 4. SUDDEN: "Gradual or sudden onset?"  constant 5. PATTERN "Does the pain come and go, or is it constant?"    - If constant: "Is it getting better, staying the same, or worsening?"      (Note: Constant means the pain never goes away completely; most serious pain is constant and it progresses)     - If intermittent: "How long does it last?" "Do you have pain now?"     (Note: Intermittent means the pain goes away completely between bouts)    constant 6. SEVERITY: "How bad is the pain?"  (e.g., Scale 1-10; mild, moderate, or severe)    - MILD (1-3): doesn't interfere with normal activities, abdomen soft and not tender to touch     - MODERATE (4-7): interferes with normal activities or awakens from sleep, tender to touch     - SEVERE (8-10): excruciating pain, doubled over, unable to do any normal activities    7-10 out of pain 7. RECURRENT  SYMPTOM: "Have you ever had this type of stomach pain before?" If Yes, ask: "When was the last time?" and "What happened that time?"      Yes, kidney stone 8. CAUSE: "What do you think is causing the stomach pain?"     ? Kidney stone 9. RELIEVING/AGGRAVATING FACTORS: "What makes it better or worse?" (e.g., movement, antacids, bowel movement)     Tylenol and heating pad has helped 10. OTHER SYMPTOMS: "Has there been any vomiting, diarrhea, constipation, or urine problems?"     no  Protocols used: ABDOMINAL PAIN - MALE-A-AH

## 2020-06-17 NOTE — Telephone Encounter (Signed)
Notes to clinic:  medication filled by other providers  Review for refills   Requested Prescriptions  Pending Prescriptions Disp Refills   amLODipine (NORVASC) 10 MG tablet 90 tablet 1    Sig: Take 1 tablet (10 mg total) by mouth daily. MUST MAKE APPT FOR FURTHER REFILLS      Cardiovascular:  Calcium Channel Blockers Failed - 06/17/2020  9:51 AM      Failed - Last BP in normal range    BP Readings from Last 1 Encounters:  04/04/20 (!) 154/99          Passed - Valid encounter within last 6 months    Recent Outpatient Visits           3 months ago Persistent migraine aura without cerebral infarction and without status migrainosus, not intractable   [facility] [name], NP   9 months ago Elevated glucose   [place] Scot Jun, FNP   10 months ago Essential hypertension   [place] [name], NP   11 months ago Essential hypertension   [place] [name], NP   1 year ago Hypertension, unspecified type   [facility] Juluis Mire P, NP                  atorvastatin (LIPITOR) 40 MG tablet 90 tablet 3    Sig: Take 1 tablet (40 mg total) by mouth daily.      Cardiovascular:  Antilipid - Statins Failed - 06/17/2020  9:51 AM      Failed - Total Cholesterol in normal range and within 360 days    Cholesterol, Total  Date Value Ref Range Status  04/29/2019 212 (H) 100 - 199 mg/dL Final          Failed - LDL in normal range and within 360 days    LDL Chol Calc (NIH)  Date Value Ref Range Status  04/29/2019 111 (H) 0 - 99 mg/dL Final   Direct LDL  Date Value Ref Range Status  01/24/2012 107.5 mg/dL Final    Comment:    Optimal:  <100 mg/dLNear or Above Optimal:  100-129 mg/dLBorderline High:  130-159 mg/dLHigh:  160-189 mg/dLVery High:  >190 mg/dL          Failed - HDL in normal range and within 360  days    HDL  Date Value Ref Range Status  04/29/2019 66 >39 mg/dL Final          Failed - Triglycerides in normal range and within 360 days    Triglycerides  Date Value Ref Range Status  04/29/2019 205 (H) 0 - 149 mg/dL Final          Passed - Patient is not pregnant      Passed - Valid encounter within last 12 months    Recent Outpatient Visits           3 months ago Persistent migraine aura without cerebral infarction and without status migrainosus, not intractable   [facility] [name], NP   9 months ago Elevated glucose   [place] Scot Jun, FNP   10 months ago Essential hypertension   [facility] [name], NP   11 months ago Essential hypertension   [facility] [name], NP   1  year ago Hypertension, unspecified type   [facility] [name], NP                 Signed Prescriptions Disp Refills   carvedilol (COREG) 25 MG tablet 180 tablet 0    Sig: Take 1 tablet (25 mg total) by mouth 2 (two) times daily with a meal.      Cardiovascular:  Beta Blockers Failed - 06/17/2020  9:51 AM      Failed - Last BP in normal range    BP Readings from Last 1 Encounters:  04/04/20 (!) 154/99          Passed - Last Heart Rate in normal range    Pulse Readings from Last 1 Encounters:  04/04/20 66          Passed - Valid encounter within last 6 months    Recent Outpatient Visits           3 months ago Persistent migraine aura without cerebral infarction and without status migrainosus, not intractable   [facility] [name], NP   9 months ago Elevated glucose   [place] Scot Jun, FNP   10 months ago Essential hypertension   [place], Michelle P, NP   11 months ago Essential hypertension   [place] [name], NP   1 year ago Hypertension, unspecified type   [place] [name], NP

## 2020-06-17 NOTE — ED Triage Notes (Signed)
Patient states he has had right lower quadrant pain and right flank pain x 3 days. Pt also states he may have blood in his urine because it is dark red in color. Pt is aox4 and ambulatory.

## 2020-06-17 NOTE — Telephone Encounter (Signed)
Medication: amLODipine (NORVASC) 10 MG tablet [[id number]] , carvedilol (COREG) 25 MG tablet [[id number]], omeprazole (PRILOSEC) 20 MG capsule [[id number]] , atorvastatin (LIPITOR) 40 MG tablet [[id number]]  ENDED  Has the patient contacted their pharmacy? YES  (Agent: If no, request that the patient contact the pharmacy for the refill.) (Agent: If yes, when and what did the pharmacy advise?)  Preferred Pharmacy (with phone number or street name): [place], [place] [address] [place] [address] Phone: [phone] Fax: [phone] Hours: Not open 24 hours    Agent: Please be advised that RX refills may take up to 3 business days. We ask that you follow-up with your pharmacy.

## 2020-06-17 NOTE — Discharge Instructions (Signed)
Increase oral fluid intake Take medications as directed If you have worsening symptoms please return to urgent care to be reevaluated We will call you with lab results if they are abnormal.

## 2020-06-19 LAB — CBC WITH DIFFERENTIAL/PLATELET
Basophils Absolute: 0.1 10*3/uL (ref 0.0–0.2)
Basos: 1 %
EOS (ABSOLUTE): 0.3 10*3/uL (ref 0.0–0.4)
Eos: 3 %
Hematocrit: 39.8 % (ref 37.5–51.0)
Hemoglobin: 13.1 g/dL (ref 13.0–17.7)
Immature Grans (Abs): 0 10*3/uL (ref 0.0–0.1)
Immature Granulocytes: 0 %
Lymphocytes Absolute: 3.5 10*3/uL — ABNORMAL HIGH (ref 0.7–3.1)
Lymphs: 34 %
MCH: 30.8 pg (ref 26.6–33.0)
MCHC: 32.9 g/dL (ref 31.5–35.7)
MCV: 94 fL (ref 79–97)
Monocytes Absolute: 0.8 10*3/uL (ref 0.1–0.9)
Monocytes: 8 %
Neutrophils Absolute: 5.5 10*3/uL (ref 1.4–7.0)
Neutrophils: 54 %
Platelets: 218 10*3/uL (ref 150–450)
RBC: 4.25 x10E6/uL (ref 4.14–5.80)
RDW: 13.4 % (ref 11.6–15.4)
WBC: 10.1 10*3/uL (ref 3.4–10.8)

## 2020-06-19 LAB — BASIC METABOLIC PANEL
BUN/Creatinine Ratio: 8 — ABNORMAL LOW (ref 9–20)
BUN: 11 mg/dL (ref 6–24)
CO2: 18 mmol/L — ABNORMAL LOW (ref 20–29)
Calcium: 8.4 mg/dL — ABNORMAL LOW (ref 8.7–10.2)
Chloride: 101 mmol/L (ref 96–106)
Creatinine, Ser: 1.34 mg/dL — ABNORMAL HIGH (ref 0.76–1.27)
GFR calc Af Amer: 67 mL/min/{1.73_m2} (ref >59)
GFR calc non Af Amer: 58 mL/min/{1.73_m2} — ABNORMAL LOW (ref >59)
Glucose: 105 mg/dL — ABNORMAL HIGH (ref 65–99)
Potassium: 3.2 mmol/L — ABNORMAL LOW (ref 3.5–5.2)
Sodium: 138 mmol/L (ref 134–144)

## 2020-06-20 LAB — URINE CULTURE: Culture: >=100000 — AB

## 2020-06-30 ENCOUNTER — Other Ambulatory Visit: Payer: Self-pay

## 2020-06-30 ENCOUNTER — Other Ambulatory Visit (INDEPENDENT_AMBULATORY_CARE_PROVIDER_SITE_OTHER): Payer: Self-pay | Admitting: Primary Care

## 2020-06-30 ENCOUNTER — Encounter (INDEPENDENT_AMBULATORY_CARE_PROVIDER_SITE_OTHER): Payer: Self-pay | Admitting: Primary Care

## 2020-06-30 ENCOUNTER — Ambulatory Visit (INDEPENDENT_AMBULATORY_CARE_PROVIDER_SITE_OTHER): Payer: 59 | Admitting: Primary Care

## 2020-06-30 VITALS — BP 134/87 | HR 78 | Temp 98.2°F | Ht 72.0 in | Wt 246.4 lb

## 2020-06-30 DIAGNOSIS — F5101 Primary insomnia: Secondary | ICD-10-CM

## 2020-06-30 DIAGNOSIS — I10 Essential (primary) hypertension: Secondary | ICD-10-CM | POA: Diagnosis not present

## 2020-06-30 DIAGNOSIS — Z09 Encounter for follow-up examination after completed treatment for conditions other than malignant neoplasm: Secondary | ICD-10-CM | POA: Diagnosis not present

## 2020-06-30 DIAGNOSIS — E781 Pure hyperglyceridemia: Secondary | ICD-10-CM

## 2020-06-30 DIAGNOSIS — G43509 Persistent migraine aura without cerebral infarction, not intractable, without status migrainosus: Secondary | ICD-10-CM

## 2020-06-30 DIAGNOSIS — E785 Hyperlipidemia, unspecified: Secondary | ICD-10-CM

## 2020-06-30 MED ORDER — TRAZODONE HCL 100 MG PO TABS
100.0000 mg | ORAL_TABLET | Freq: Every day | ORAL | 1 refills | Status: DC
Start: 2020-06-30 — End: 2020-06-30

## 2020-06-30 MED ORDER — ASPIRIN 81 MG PO TBEC
81.0000 mg | DELAYED_RELEASE_TABLET | Freq: Every day | ORAL | 3 refills | Status: DC
Start: 2020-06-30 — End: 2020-06-30

## 2020-06-30 MED ORDER — ATORVASTATIN CALCIUM 40 MG PO TABS: 40.0000 mg | ORAL_TABLET | Freq: Every day | ORAL | 3 refills | Status: DC

## 2020-06-30 MED ORDER — SUMATRIPTAN SUCCINATE 50 MG PO TABS: 50.0000 mg | ORAL_TABLET | ORAL | 0 refills | Status: DC | PRN

## 2020-06-30 MED ORDER — CARVEDILOL 25 MG PO TABS: 25.0000 mg | ORAL_TABLET | Freq: Two times a day (BID) | ORAL | 0 refills | Status: DC

## 2020-06-30 MED ORDER — AMLODIPINE BESYLATE 10 MG PO TABS: 10.0000 mg | ORAL_TABLET | Freq: Every day | ORAL | 1 refills | Status: DC

## 2020-06-30 MED ORDER — TRAZODONE HCL 100 MG PO TABS: 100.0000 mg | ORAL_TABLET | Freq: Every day | ORAL | 1 refills | Status: DC

## 2020-06-30 NOTE — Patient Instructions (Signed)

## 2020-06-30 NOTE — Progress Notes (Signed)
Assessment and Plan:    HPI [name]. Elahi is 60 y.o.male presents for follow up from the Urgent Care on  06/17/20, treatment for cystitis completed antibiotics feels a lot better. Increase water.   Past Medical History:  Diagnosis Date   Alcohol abuse    Anxiety    Arthritis    Diverticulosis of colon (without mention of hemorrhage)    GERD (gastroesophageal reflux disease)    History of kidney stones    Hyperlipemia    Hypertension    dr Ronalee Belts  norins   Kidney stones    Nephrolithiasis    Pancreatitis    Hx of    Ureteral stent displacement (HCC) 1990     Allergies  Allergen Reactions   Hydrocodone-Acetaminophen Palpitations   Shrimp [Shellfish Allergy] Other (See Comments)    Cholesterol level becomes very high. Patient reports sweating and itching.      Current Outpatient Medications on File Prior to Visit  Medication Sig Dispense Refill   acetaminophen (TYLENOL) 500 MG tablet Take 2 tablets (1,000 mg total) by mouth every 6 (six) hours as needed. 30 tablet 0   amLODipine (NORVASC) 10 MG tablet Take 1 tablet (10 mg total) by mouth daily. MUST MAKE APPT FOR FURTHER REFILLS 90 tablet 1   aspirin 81 MG EC tablet Take 81 mg by mouth daily. Swallow whole.     carvedilol (COREG) 25 MG tablet Take 1 tablet (25 mg total) by mouth 2 (two) times daily with a meal. 180 tablet 0   ciprofloxacin (CIPRO) 500 MG tablet Take 1 tablet (500 mg total) by mouth every 12 (twelve) hours. 10 tablet 0   FIBER PO Take 1 capsule by mouth daily.     ibuprofen (ADVIL) 600 MG tablet Take 1 tablet (600 mg total) by mouth every 6 (six) hours as needed. 30 tablet 0   losartan (COZAAR) 100 MG tablet Take 1 tablet (100 mg total) by mouth daily. Must have office visit for refills 90 tablet 1   Multiple Vitamins-Minerals (MULTIVITAMIN WITH MINERALS) tablet Take 1 tablet by mouth daily.     omeprazole (PRILOSEC) 20 MG capsule TAKE 1 CAPSULE (20 MG TOTAL) BY MOUTH DAILY. 30  capsule 2   SUMAtriptan (IMITREX) 50 MG tablet Take 1 tablet (50 mg total) by mouth every 2 (two) hours as needed for migraine. May repeat in 2 hours if headache persists or recurs. 10 tablet 0   Tetrahydrozoline HCl (VISINE OP) Place 1 drop into both eyes daily as needed (redness).     traZODone (DESYREL) 100 MG tablet Take 0.5 tablets (50 mg total) by mouth at bedtime. 90 tablet 0   triamcinolone cream (KENALOG) 0.1 % Apply 1 application topically 2 (two) times daily. 60 g 0   atorvastatin (LIPITOR) 40 MG tablet Take 1 tablet (40 mg total) by mouth daily. 90 tablet 3   [DISCONTINUED] diphenhydrAMINE (BENADRYL) 25 MG tablet Take 1 tablet (25 mg total) by mouth every 6 (six) hours as needed. 30 tablet 0   [DISCONTINUED] metoCLOPramide (REGLAN) 10 MG tablet Take 1 tablet (10 mg total) by mouth every 6 (six) hours as needed for nausea (nausea/headache). Take this tablet with 25 mg of Benadryl and two extra strength Tylenol for headache. 12 tablet 0   No current facility-administered medications on file prior to visit.    ROS: all negative except above.   Physical Exam: Filed Weights   06/30/20 1111  Weight: 246 lb 6.4 oz (111.8 kg)   BP  134/87 (BP Location: Right Arm, Patient Position: Sitting, Cuff Size: Large)    Pulse 78    Temp 98.2 F (36.8 C) (Temporal)    Ht 6' (1.829 m)    Wt 246 lb 6.4 oz (111.8 kg)    SpO2 96%    BMI 33.42 kg/m  General Appearance: Well nourished, obese male in no apparent distress. Eyes: PERRLA, EOMs, conjunctiva no swelling or erythema ENT/Mouth: Ext aud canals clear, TMs without erythema, bulging. Hearing normal.  Neck: Supple, thyroid normal.  Respiratory: Respiratory effort normal, BS equal bilaterally without rales, rhonchi, wheezing or stridor.  Cardio: RRR with no MRGs. Brisk peripheral pulses without edema.  Abdomen: Soft, + BS.  Non tender, no guarding, rebound, hernias, masses. Lymphatics: Non tender without lymphadenopathy.  Musculoskeletal:  Full ROM, 5/5 strength, normal gait.  Skin: Warm, dry without rashes, lesions, ecchymosis.  Neuro: Cranial nerves intact. Normal muscle tone, no cerebellar symptoms. Sensation intact.  Psych: Awake and oriented X 3, normal affect, Insight and Judgment appropriate.    Algie was seen today for follow-up.  Diagnoses and all orders for this visit:  Hospital discharge follow-up Evaluated in emergency room diagnosis was cystitis treated with antibiotics denies any signs and symptoms of urinary problems noted as resolved  Essential hypertension Counseled on blood pressure goal of less than 130/80, blood pressure is close to goal continue to follow a low-sodium, DASH diet, medication compliance, 150 minutes of moderate intensity exercise per week. Discussed medication compliance, adverse effects.   amLODipine (NORVASC) 10 MG tablet; Take 1 tablet (10 mg total) by mouth daily.  -     carvedilol (COREG) 25 MG tablet; Take 1 tablet (25 mg total) by mouth 2 (two) times daily with a meal. -      Primary insomnia -     traZODone (DESYREL) 100 MG tablet; Take 1 tablet (100 mg total) by mouth at bedtime. -    Hyperlipidemia, unspecified hyperlipidemia type Discussed effects of elevated cholesterol which can lead to heart attack and stroke. Encouraged to decrease your fatty foods, red meat, cheese, milk and increase fiber like whole grains and veggies.  -     atorvastatin (LIPITOR) 40 MG tablet; Take 1 tablet (40 mg total) by mouth daily.  Persistent migraine aura without cerebral infarction and without status migrainosus, not intractable -     SUMAtriptan (IMITREX) 50 MG tablet; Take 1 tablet (50 mg total) by mouth every 2 (two) hours as needed for migraine. May repeat in 2 hours if headache persists or recurs.  Other orders -   -     aspirin 81 MG EC tablet; Take 1 tablet (81 mg total) by mouth daily. Swallow whole.   [name], NP 11:28 AM

## 2020-07-08 ENCOUNTER — Other Ambulatory Visit (INDEPENDENT_AMBULATORY_CARE_PROVIDER_SITE_OTHER): Payer: Self-pay | Admitting: Primary Care

## 2020-07-08 DIAGNOSIS — I10 Essential (primary) hypertension: Secondary | ICD-10-CM

## 2020-07-08 NOTE — Telephone Encounter (Signed)
Requested medication (s) are due for refill today:   Yes  Requested medication (s) are on the active medication list:   Yes  Future visit scheduled:   Seen 1 wk ago for hospital f/u by Juluis Mire   Last ordered: 09/08/2019 #90, 1 refill  Clinic note:  Returned because this was prescribed by Molli Barrows, FNP but now pt is under Michelle's care.   Sent for her to review for refill.   Requested Prescriptions  Pending Prescriptions Disp Refills   losartan (COZAAR) 100 MG tablet [Pharmacy Med Name: Losartan Potassium 100 MG Oral Tablet] 90 tablet 0    Sig: Take 1 tablet by mouth once daily      Cardiovascular:  Angiotensin Receptor Blockers Failed - 07/08/2020 12:25 PM      Failed - Cr in normal range and within 180 days    Creatinine, Ser  Date Value Ref Range Status  06/17/2020 1.34 (H) 0.76 - 1.27 mg/dL Final          Failed - K in normal range and within 180 days    Potassium  Date Value Ref Range Status  06/17/2020 3.2 (L) 3.5 - 5.2 mmol/L Final          Passed - Patient is not pregnant      Passed - Last BP in normal range    BP Readings from Last 1 Encounters:  06/30/20 134/87          Passed - Valid encounter within last 6 months    Recent Outpatient Visits           1 week ago Hospital discharge follow-up   [place] Juluis Mire P, NP   4 months ago Persistent migraine aura without cerebral infarction and without status migrainosus, not intractable   [facility] [name], NP   10 months ago Elevated glucose   [place] Scot Jun, FNP   11 months ago Essential hypertension   [place] [name], NP   1 year ago Essential hypertension   [place], NP       Future Appointments             In 2 months Oletta Lamas, [place] Cage, NP [place]

## 2020-08-01 ENCOUNTER — Ambulatory Visit: Payer: Self-pay | Admitting: Diagnostic Neuroimaging

## 2020-08-10 ENCOUNTER — Other Ambulatory Visit (INDEPENDENT_AMBULATORY_CARE_PROVIDER_SITE_OTHER): Payer: Self-pay | Admitting: Primary Care

## 2020-08-10 DIAGNOSIS — I10 Essential (primary) hypertension: Secondary | ICD-10-CM

## 2020-08-10 NOTE — Telephone Encounter (Signed)
Medication Refill - Medication: losartan (COZAAR) 100 MG tablet     Preferred Pharmacy (with phone number or street name):  [place], [address] Phone:  [phone]  Fax:  [phone]       Agent: Please be advised that RX refills may take up to 3 business days. We ask that you follow-up with your pharmacy.

## 2020-08-10 NOTE — Telephone Encounter (Signed)
Pharmacy has script from 07/06/20 and will get ready for patient .

## 2020-08-19 ENCOUNTER — Other Ambulatory Visit (INDEPENDENT_AMBULATORY_CARE_PROVIDER_SITE_OTHER): Payer: Self-pay | Admitting: Primary Care

## 2020-08-19 DIAGNOSIS — Z760 Encounter for issue of repeat prescription: Secondary | ICD-10-CM

## 2020-08-19 MED ORDER — OMEPRAZOLE 20 MG PO CPDR: 20.0000 mg | DELAYED_RELEASE_CAPSULE | Freq: Every day | ORAL | 2 refills | Status: DC

## 2020-08-19 NOTE — Telephone Encounter (Signed)
Requested Prescriptions  Pending Prescriptions Disp Refills  . omeprazole (PRILOSEC) 20 MG capsule 30 capsule 2    Sig: Take 1 capsule (20 mg total) by mouth daily.     Gastroenterology: Proton Pump Inhibitors Passed - 08/19/2020  1:36 PM      Passed - Valid encounter within last 12 months    Recent Outpatient Visits          1 month ago Hospital discharge follow-up   [place] Juluis Mire P, NP   5 months ago Persistent migraine aura without cerebral infarction and without status migrainosus, not intractable   [facility] [name], NP   11 months ago Elevated glucose   [place] Scot Jun, FNP   1 year ago Essential hypertension   [place], Michelle P, NP   1 year ago Essential hypertension   [place], NP      Future Appointments            In 1 month Oletta Lamas, [place] Cage, NP [place]

## 2020-08-19 NOTE — Telephone Encounter (Signed)
Medication Refill - Medication: omeprazole (PRILOSEC) 20 MG capsule. Patient states he will take his last pill on Sunday. Patient also would like PCP to go through his medications and send any other medications that are requiring refills.    Has the patient contacted their pharmacy? Yes.    (Agent: If yes, when and what did the pharmacy advise?) Contact PCP office  Preferred Pharmacy (with phone number or street name):   [place], [address] Phone:  [phone]  Fax:  [phone]       Agent: Please be advised that RX refills may take up to 3 business days. We ask that you follow-up with your pharmacy.

## 2020-09-13 ENCOUNTER — Encounter (INDEPENDENT_AMBULATORY_CARE_PROVIDER_SITE_OTHER): Payer: 59 | Admitting: Primary Care

## 2020-09-18 ENCOUNTER — Other Ambulatory Visit: Payer: Self-pay | Admitting: Cardiovascular Disease

## 2020-09-18 ENCOUNTER — Other Ambulatory Visit (INDEPENDENT_AMBULATORY_CARE_PROVIDER_SITE_OTHER): Payer: Self-pay | Admitting: Primary Care

## 2020-09-18 DIAGNOSIS — I10 Essential (primary) hypertension: Secondary | ICD-10-CM

## 2020-09-18 DIAGNOSIS — E785 Hyperlipidemia, unspecified: Secondary | ICD-10-CM

## 2020-09-18 DIAGNOSIS — E781 Pure hyperglyceridemia: Secondary | ICD-10-CM

## 2020-09-18 NOTE — Telephone Encounter (Signed)
Requested Prescriptions  Pending Prescriptions Disp Refills  . amLODipine (NORVASC) 10 MG tablet [Pharmacy Med Name: amLODIPine Besylate 10 MG Oral Tablet] 90 tablet 0    Sig: Take 1 tablet (10 mg total) by mouth daily.     Cardiovascular:  Calcium Channel Blockers Passed - 09/18/2020 10:51 AM      Passed - Last BP in normal range    BP Readings from Last 1 Encounters:  06/30/20 134/87         Passed - Valid encounter within last 6 months    Recent Outpatient Visits          2 months ago Hospital discharge follow-up   [place] Juluis Mire P, NP   6 months ago Persistent migraine aura without cerebral infarction and without status migrainosus, not intractable   [place] [name], NP   1 year ago Elevated glucose   [place] Scot Jun, FNP   1 year ago Essential hypertension   [place], [place], NP   1 year ago Essential hypertension   [place], [place], NP      Future Appointments            In 2 weeks Oletta Lamas [place] Cage, NP [place]   In 2 months Gwenlyn Found, Pearletha For[place], MD [place], [facility]

## 2020-09-19 ENCOUNTER — Encounter: Payer: Self-pay | Admitting: Diagnostic Neuroimaging

## 2020-09-19 ENCOUNTER — Ambulatory Visit: Payer: 59 | Admitting: Diagnostic Neuroimaging

## 2020-09-19 ENCOUNTER — Telehealth: Payer: Self-pay | Admitting: *Deleted

## 2020-09-19 VITALS — BP 143/90 | HR 81 | Ht 72.0 in | Wt 243.8 lb

## 2020-09-19 DIAGNOSIS — R519 Headache, unspecified: Secondary | ICD-10-CM | POA: Diagnosis not present

## 2020-09-19 DIAGNOSIS — G43109 Migraine with aura, not intractable, without status migrainosus: Secondary | ICD-10-CM

## 2020-09-19 MED ORDER — AIMOVIG 70 MG/ML ~~LOC~~ SOAJ: 70.0000 mg | SUBCUTANEOUS | 4 refills | Status: DC

## 2020-09-19 MED ORDER — NURTEC 75 MG PO TBDP: 75.0000 mg | ORAL_TABLET | Freq: Every day | ORAL | 6 refills | Status: DC | PRN

## 2020-09-19 MED ORDER — RIZATRIPTAN BENZOATE 10 MG PO TBDP: 10.0000 mg | ORAL_TABLET | ORAL | 11 refills | Status: DC | PRN

## 2020-09-19 NOTE — Patient Instructions (Addendum)
NEW ONSET HEADACHE - check MRI brain   MIGRAINE TREATMENT PLAN:  MIGRAINE PREVENTION  LIFESTYLE CHANGES -Stop or avoid smoking -Decrease or avoid caffeine / alcohol -Eat and sleep on a regular schedule -Exercise several times per week - cannot use topiramate (history of kidney stones) - start erenumab (Aimovig) 70mg  monthly (may increase to 140mg  monthly) - fremanezumab (Ajovy) 225mg  monthly (or 675mg  every 3 months) - galazanezumab (Emgality) 240mg  loading dose; then 120mg  monthly   MIGRAINE RESCUE  - ibuprofen, tylenol as needed - rizatriptan (Maxalt) 10mg  as needed for breakthrough headache; may repeat x 1 after 2 hours; max 2 tabs per day or 8 per month - rimegepant (Nurtec) 75mg  as needed for breakthrough headache; max 8 per month   ANXIETY / INSOMNIA / ALCOHOL ABUSE - follow up with PCP; consider psychiatry / psychology follow up

## 2020-09-19 NOTE — Telephone Encounter (Signed)
Nurtec PA, key: [id number], [id number] [facility] is processing your request.

## 2020-09-19 NOTE — Telephone Encounter (Signed)
Clinical questions answered. Your information has been sent to [facility].

## 2020-09-19 NOTE — Telephone Encounter (Signed)
Clinical questions answered. Your information has been sent to [facility]. 
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
## 2020-09-19 NOTE — Telephone Encounter (Signed)
Nurtec approved from 09/19/20 to 03/21/21, max of 6 fills. Faxed approval to pharmacy.

## 2020-09-19 NOTE — Telephone Encounter (Signed)
Aimovig PA, key: [id number], [id number],failed sumtriptan, cannot take Topiramate due to history of kidney stones. [employer] is processing your PA request and will respond shortly with next steps.

## 2020-09-20 MED ORDER — AMITRIPTYLINE HCL 25 MG PO TABS: 25.0000 mg | ORAL_TABLET | Freq: Every day | ORAL | 2 refills | Status: DC

## 2020-09-20 NOTE — Telephone Encounter (Addendum)
Called patient and informed him his insurance denied Aimovig. Dr Leta Baptist recommends he trial Amitriptyline at night. He agreed, had questions which were answered to his stated satisfaction. I advised he call after starting new med with any questions concerns. Patient verbalized understanding, appreciation. Called [facility], spoke with Claiborne Billings advising MD discontinued Aimovig. She verbalized understanding, appreciation.

## 2020-09-20 NOTE — Telephone Encounter (Signed)
Aimovig denied: must try one of following preventatives:  valproic acid/divalproex Na, topiramate, propranolol, timolol, metoprolol, amitriptyline, venlafaxine, atenolol, nadolol, or Botox.  Sent to MD for recommendations.

## 2020-09-20 NOTE — Telephone Encounter (Signed)
Trial of amitriptyline 25mg  at bedtime. -VRP

## 2020-09-20 NOTE — Addendum Note (Signed)
Addended by: Florian Buff C on: 09/20/2020 12:04 PM   Modules accepted: Orders

## 2020-09-20 NOTE — Addendum Note (Signed)
Addended by: Florian Buff C on: 09/20/2020 12:00 PM   Modules accepted: Orders

## 2020-09-27 ENCOUNTER — Ambulatory Visit (INDEPENDENT_AMBULATORY_CARE_PROVIDER_SITE_OTHER): Payer: 59 | Admitting: Family Medicine

## 2020-09-28 IMAGING — CT CT ABD-PELV W/ CM
2 of 5 series · 16 of 46 positions shown, 18 images · IV contrast (APPLIED)
Comparison: CT Abdomen and Pelvis 10/06/2016 and earlier.

CLINICAL DATA: 57-year-old male with right abdominal pain radiating
to the flank since last night. Nausea vomiting. Abdominal
distension.

EXAM:
CT ABDOMEN AND PELVIS WITH CONTRAST
TECHNIQUE: Multidetector CT imaging of the abdomen and pelvis was performed
using the standard protocol following bolus administration of
intravenous contrast.
CONTRAST:  100mL OMNIPAQUE IOHEXOL 300 MG/ML  SOLN

[Series 3: abd/ pelvis 5.0 i30f 2 · axial · 0.98mm/px · z∈[-426,-1]mm · 13 of 97 slices shown, 15 images]
[im 6/97  soft-tissue]
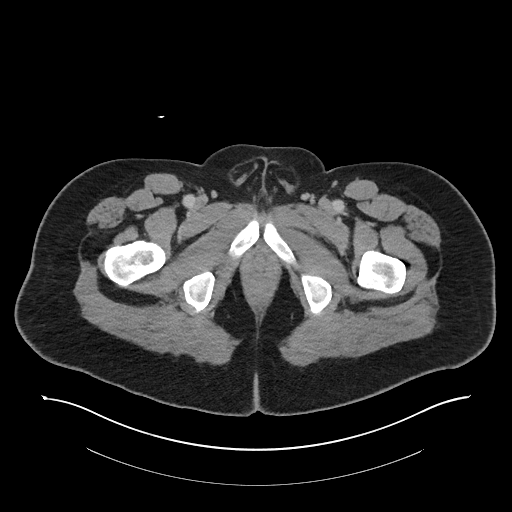
[im 6/97  bone]
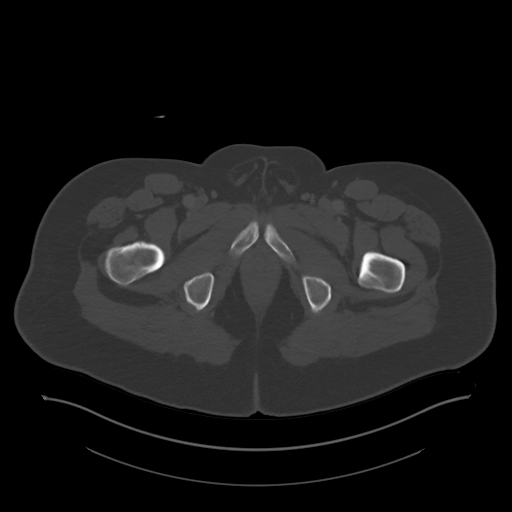
[im 16/97  soft-tissue]
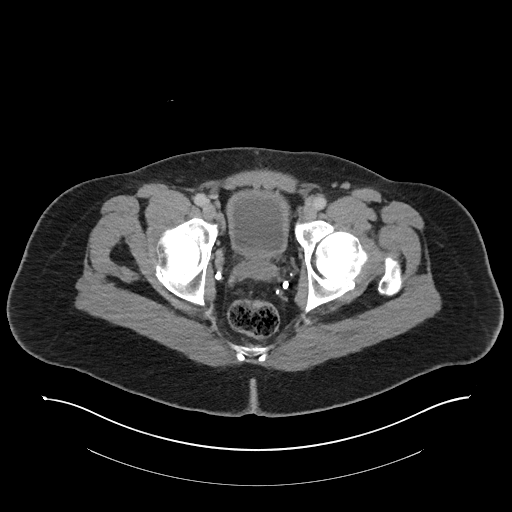
[im 21/97  soft-tissue]
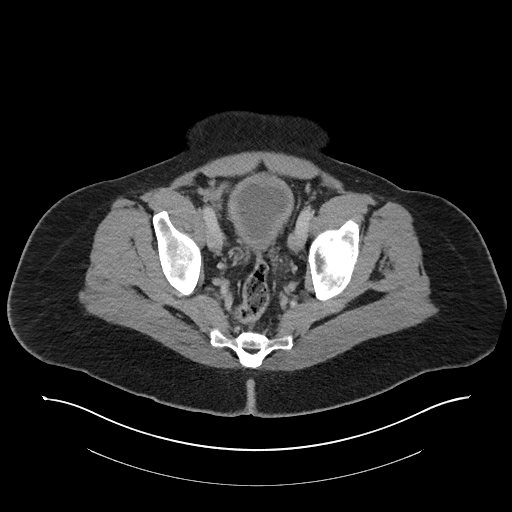
[im 26/97  soft-tissue]
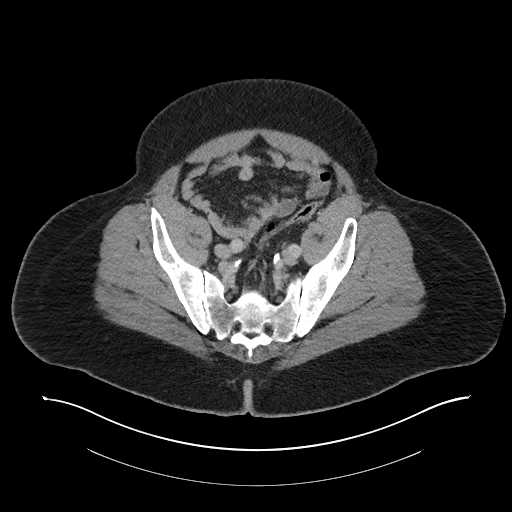
[im 36/97  soft-tissue]
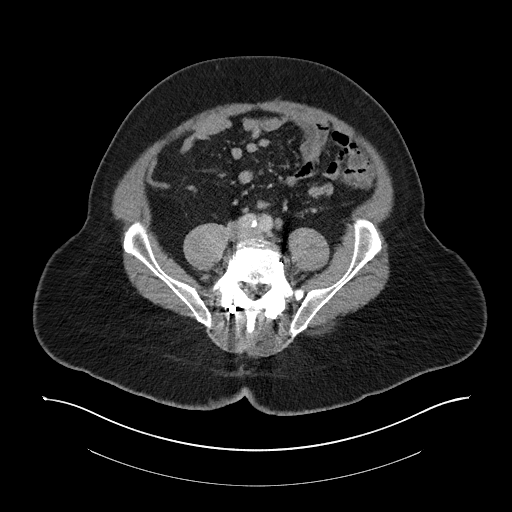
[im 41/97  soft-tissue]
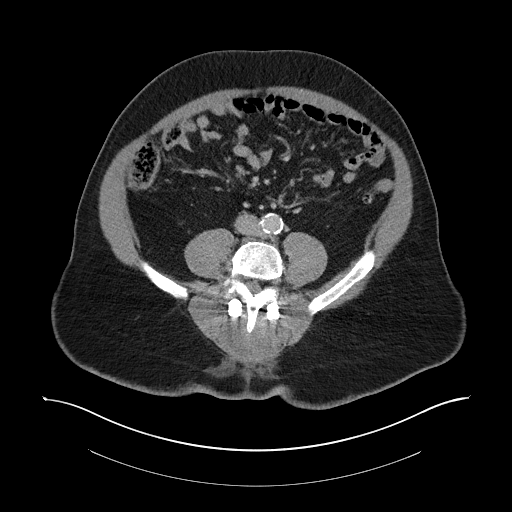
[im 51/97  soft-tissue]
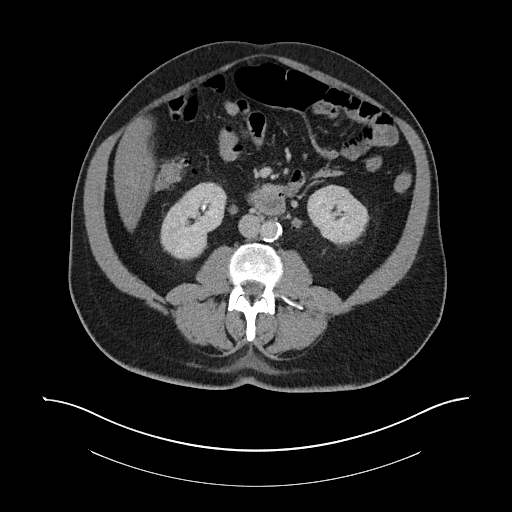
[im 56/97  soft-tissue]
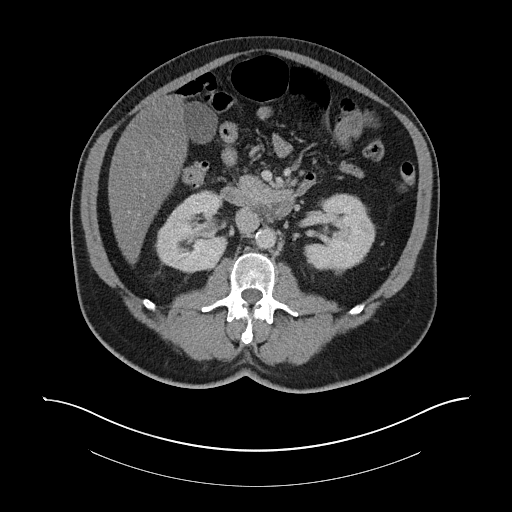
[im 61/97  soft-tissue]
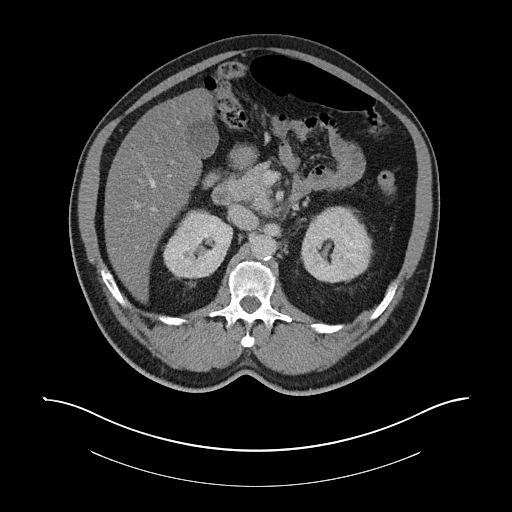
[im 61/97  bone]
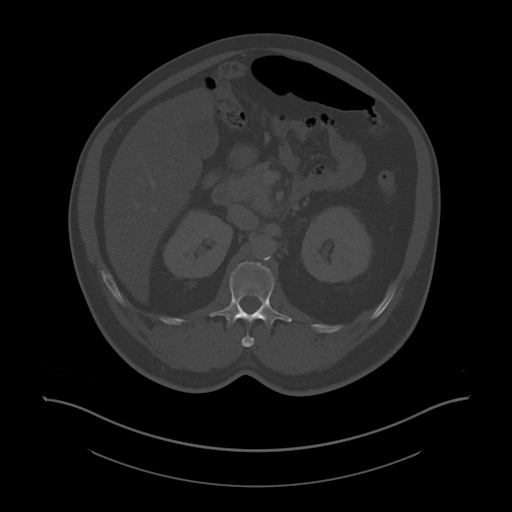
[im 71/97  soft-tissue]
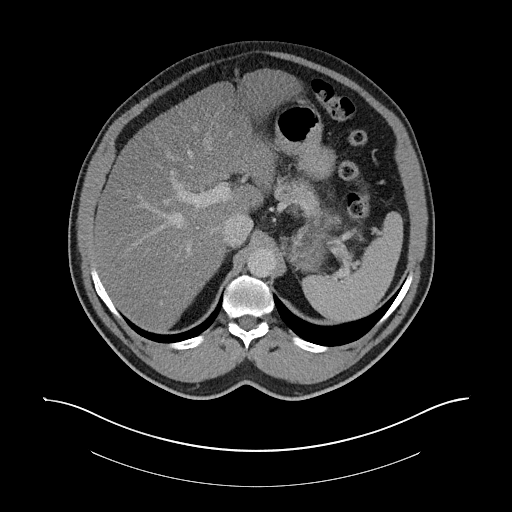
[im 76/97  soft-tissue]
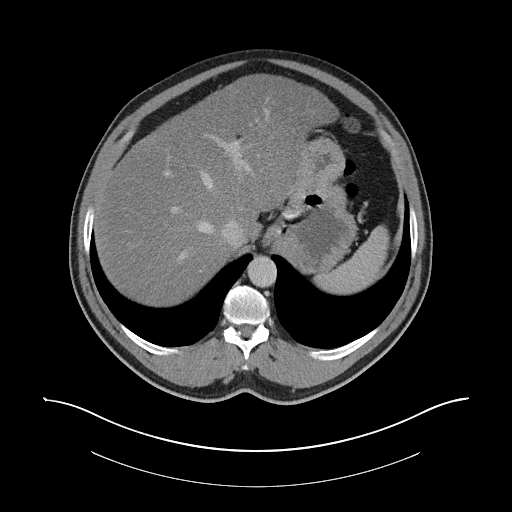
[im 81/97  soft-tissue]
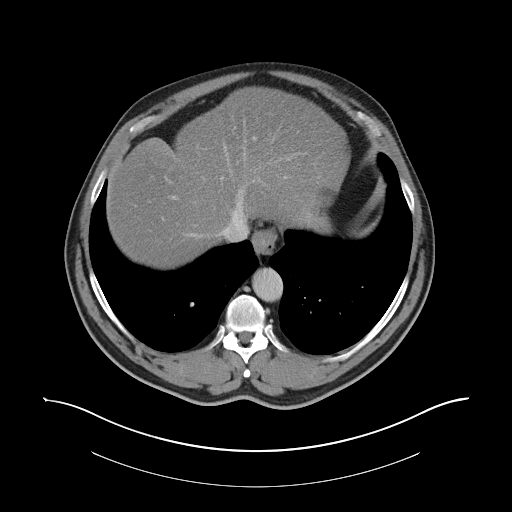
[im 91/97  soft-tissue]
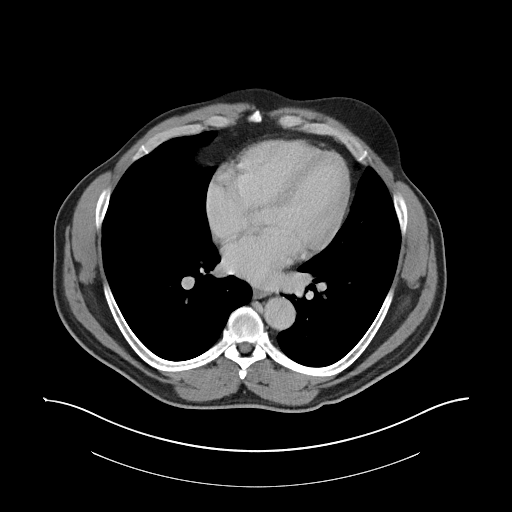

[Series 6: coronal soft tissue · coronal · 0.91mm/px · 3 of 121 slices shown]
[im 41/121  soft-tissue]
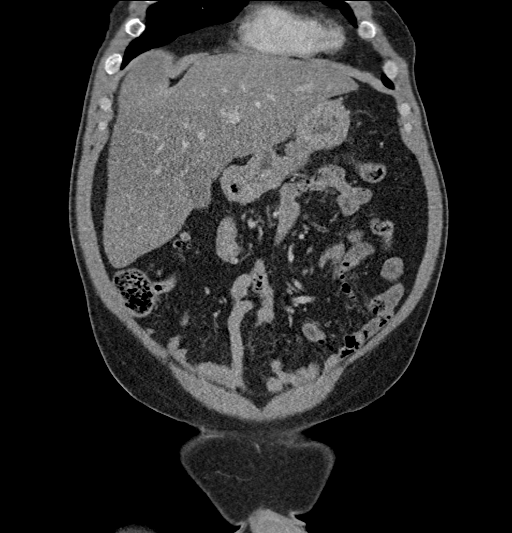
[im 54/121  soft-tissue]
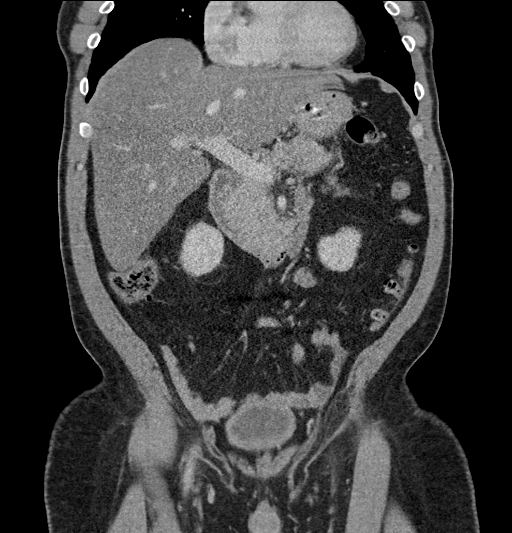
[im 67/121  soft-tissue]
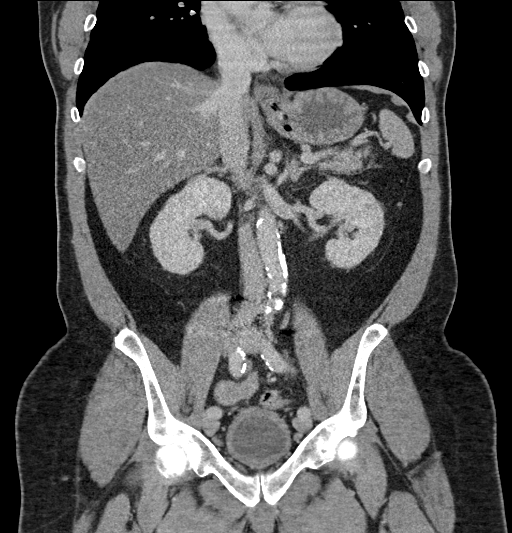

[16 of 46 positions shown; findings below may reference images not displayed]

FINDINGS: Lower chest: Negative. No pericardial or pleural effusion.

Hepatobiliary: Hepatic steatosis. Negative gallbladder.

Pancreas: Inflamed pancreatic body and tail (series 3, image 31)
with peripancreatic inflammatory stranding. No pancreatic necrosis
identified. No associated fluid collection.

Spleen: Negative aside from pancreatic inflammation at the splenic
hilum.

Adrenals/Urinary Tract: Adrenal glands are within normal limits.
Bilateral nephrolithiasis, largest stone is 4-5 millimeters in the
right lower pole. Symmetric bilateral renal enhancement and contrast
excretion. No acute perinephric stranding. Both ureters are within
normal limits.

Diminutive, unremarkable urinary bladder.

Stomach/Bowel: Redundant sigmoid colon which tracks into the left
upper quadrant and epigastrium. Mild to moderate sigmoid and
descending colon diverticulosis. No associated inflammation.
Decompressed transverse colon. Moderate diverticulosis at the
hepatic flexure without active inflammation. The cecum is on a lax
mesentery. Normal appendix (series 3, image 62). Negative terminal
ileum.

No dilated small bowel. Stomach and duodenum remain within normal
limits.

No free air. No free fluid.

Vascular/Lymphatic: Aortoiliac calcified atherosclerosis. Major
arterial structures remain patent. The portal venous system is
patent.

No lymphadenopathy.

Reproductive: Negative.

Other: No pelvic free fluid.

Musculoskeletal: Lower lumbar spine posterior fusion sequelae. No
acute osseous abnormality identified.
IMPRESSION: 1. Acute pancreatitis. Inflammation about the pancreatic body and
tail with no fluid collection or complicating features.
2. Bilateral nephrolithiasis but no obstructive uropathy.
3. Fatty liver disease.  Large bowel diverticulosis.
4. Aortic Atherosclerosis ([id number]).

## 2020-10-04 ENCOUNTER — Ambulatory Visit (INDEPENDENT_AMBULATORY_CARE_PROVIDER_SITE_OTHER): Payer: 59 | Admitting: Primary Care

## 2020-10-04 ENCOUNTER — Other Ambulatory Visit: Payer: Self-pay

## 2020-10-04 ENCOUNTER — Encounter (INDEPENDENT_AMBULATORY_CARE_PROVIDER_SITE_OTHER): Payer: Self-pay | Admitting: Primary Care

## 2020-10-04 VITALS — BP 116/81 | HR 88 | Temp 97.5°F | Ht 72.0 in | Wt 243.6 lb

## 2020-10-04 DIAGNOSIS — M545 Low back pain, unspecified: Secondary | ICD-10-CM

## 2020-10-04 DIAGNOSIS — G8929 Other chronic pain: Secondary | ICD-10-CM | POA: Diagnosis not present

## 2020-10-04 DIAGNOSIS — I10 Essential (primary) hypertension: Secondary | ICD-10-CM

## 2020-10-04 DIAGNOSIS — G894 Chronic pain syndrome: Secondary | ICD-10-CM

## 2020-10-04 NOTE — Progress Notes (Signed)
[facility]  Subjective: CC: back pain PCP: Martin Perna, NP HPI: [name] is a 60 y.o.  Obese male presenting to clinic today for back pain. Concerns today include:  1. Back Pain Patient reports that pain began 2000 h/o back pain.  Pain is a 8/10.  It dose radiate to buttocks .  Standing and bending  worsens pain.  Rest improves pain.  Patient has been taking Tylenol  for pain with little relief.  Patient denies trauma or injury.  Denies dysuria, hematuria, fevers, chills, nausea, vomiting, abdominal pain, renal stones. Aggravating factors: certain movements and prolonged walking/standing. Alleviating factors: rest. Progressive LE weakness or saddle anesthesia: none. Extremity sensation changes or weakness: none. Ambulatory without difficulty. Normal bowel/bladder habits: yes; without urinary retention. Normal PO intake without n/v. No associated abdominal pain/n/v. Self treatment: has OTC analgesics, with minimal relief. Patient denies: urinary retention/incontinence, bowel incontinence, weakness, falls, sensation changes or pain anywhere else. He has a h/o back surgeries 03/2019.    Current Outpatient Medications:  .  acetaminophen (TYLENOL) 500 MG tablet, Take 2 tablets (1,000 mg total) by mouth every 6 (six) hours as needed., Disp: 30 tablet, Rfl: 0 .  amitriptyline (ELAVIL) 25 MG tablet, Take 1 tablet (25 mg total) by mouth at bedtime., Disp: 30 tablet, Rfl: 2 .  amLODipine (NORVASC) 10 MG tablet, Take 1 tablet (10 mg total) by mouth daily., Disp: 90 tablet, Rfl: 0 .  aspirin 81 MG EC tablet, TAKE 1 TABLET (81 MG TOTAL) BY MOUTH DAILY. SWALLOW WHOLE. (Patient taking differently: Take by mouth 5 (five) times daily. SWALLOW WHOLE.), Disp: 90 tablet, Rfl: 3 .  atorvastatin (LIPITOR) 40 MG tablet, Take 1 tablet by mouth once daily, Disp: 90 tablet, Rfl: 2 .  carvedilol (COREG) 25 MG tablet, Take 1 tablet (25 mg total) by mouth 2 (two) times daily with a meal., Disp:  180 tablet, Rfl: 0 .  FIBER PO, Take 1 capsule by mouth daily., Disp: , Rfl:  .  ibuprofen (ADVIL) 600 MG tablet, Take 1 tablet (600 mg total) by mouth every 6 (six) hours as needed., Disp: 30 tablet, Rfl: 0 .  losartan (COZAAR) 100 MG tablet, Take 1 tablet by mouth once daily, Disp: 90 tablet, Rfl: 0 .  Multiple Vitamins-Minerals (MULTIVITAMIN WITH MINERALS) tablet, Take 1 tablet by mouth daily., Disp: , Rfl:  .  omeprazole (PRILOSEC) 20 MG capsule, Take 1 capsule (20 mg total) by mouth daily., Disp: 30 capsule, Rfl: 2 .  Rimegepant Sulfate (NURTEC) 75 MG TBDP, Take 75 mg by mouth daily as needed., Disp: 8 tablet, Rfl: 6 .  rizatriptan (MAXALT-MLT) 10 MG disintegrating tablet, Take 1 tablet (10 mg total) by mouth as needed for migraine. May repeat in 2 hours if needed, Disp: 9 tablet, Rfl: 11 .  Tetrahydrozoline HCl (VISINE OP), Place 1 drop into both eyes daily as needed (redness)., Disp: , Rfl:  .  traZODone (DESYREL) 100 MG tablet, TAKE 1 TABLET (100 MG TOTAL) BY MOUTH AT BEDTIME., Disp: 90 tablet, Rfl: 1 .  triamcinolone cream (KENALOG) 0.1 %, Apply 1 application topically 2 (two) times daily., Disp: 60 g, Rfl: 0 Allergies  Allergen Reactions  . Hydrocodone-Acetaminophen Palpitations  . Shrimp [Shellfish Allergy] Other (See Comments)    Cholesterol level becomes very high. Patient reports sweating and itching.    Past Medical History:  Diagnosis Date  . Alcohol abuse   . Anxiety   . Arthritis   . Diverticulosis of colon (without mention  of hemorrhage)   . GERD (gastroesophageal reflux disease)   . History of kidney stones   . Hyperlipemia   . Hypertension    dr Ronalee Belts  norins  . Kidney stones   . Nephrolithiasis   . Pancreatitis    Hx of   . Ureteral stent displacement ([place]) 1990   Social History   Socioeconomic History  . Marital status: Legally Separated    Spouse name: Not on file  . Number of children: 3  . Years of education: Not on file  . Highest education level:  Not on file  Occupational History    Employer: [employer]    Comment: [employer] , retired  Tobacco Use  . Smoking status: Former Smoker    Types: Cigarettes    Quit date: 07/10/1998    Years since quitting: 22.2  . Smokeless tobacco: Never Used  Vaping Use  . Vaping Use: Never used  Substance and Sexual Activity  . Alcohol use: Yes    Alcohol/week: 6.0 standard drinks    Types: 6 Cans of beer per week    Comment: 1 6 pack a week  . Drug use: No  . Sexual activity: Yes  Other Topics Concern  . Not on file  Social History Narrative   HSG   [employer] grounds[employer]   Difficult domestic situation, Married '87- separated '01   2 sons- '82, '91; 1 daughter '91   His niece is 79 with metastatic breast cancer (feb '12)   Social Determinants of Health   Financial Resource Strain: Not on file  Food Insecurity: Not on file  Transportation Needs: Not on file  Physical Activity: Not on file  Stress: Not on file  Social Connections: Not on file  Intimate Partner Violence: Not on file   Past Surgical History:  Procedure Laterality Date  . A2 pulley flexor sheath cyst 4th finger excisional biopsy     '03  . ANTERIOR CRUCIATE LIGAMENT REPAIR     R knee  . ESOPHAGOGASTRODUODENOSCOPY N/A 07/09/2012   Procedure: ESOPHAGOGASTRODUODENOSCOPY (EGD);  Surgeon: Inda Castle, MD;  Location: [facility];  Service: Endoscopy;  Laterality: N/A;  . KNEE SURGERY  2008  . LEFT HEART CATH AND CORONARY ANGIOGRAPHY N/A 08/10/2019   Procedure: LEFT HEART CATH AND CORONARY ANGIOGRAPHY;  Surgeon: Lorretta Harp, MD;  Location: [facility];  Service: Cardiovascular;  Laterality: N/A;  . SHOULDER ARTHROSCOPY WITH SUBACROMIAL DECOMPRESSION AND OPEN ROTATOR C Right 12/26/2012   Procedure: RIGHT SHOULDER ARTHROSCOPY WITH SUBACROMIAL DECOMPRESSION MINI OPEN ROTATOR CUFF REPAIR, OPEN BICEP TENODESIS OPEN DISTAL CLAVICLE RESECTION  ;  Surgeon: Augustin Schooling, MD;  Location: [facility];  Service:  Orthopedics;  Laterality: Right;  . TRANSFORAMINAL LUMBAR INTERBODY FUSION (TLIF) WITH PEDICLE SCREW FIXATION 1 LEVEL Right 03/27/2018   Procedure: RIGHT-SIDED LUMBAR 4-5 TRANSFORAMINAL LUMBAR INTERBODY FUSION WITH INSTRUMENTATION AND ALLOGRAFT;  Surgeon: Phylliss Bob, MD;  Location: [facility];  Service: Orthopedics;  Laterality: Right;  . URETEROSCOPY     and stone retreval    ROS: per HPI  Objective: Office vital signs reviewed. BP 116/81 (BP Location: Right Arm, Patient Position: Sitting, Cuff Size: Large)   Pulse 88   Temp (!) 97.5 F (36.4 C) (Temporal)   Ht 6' (1.829 m)   Wt 243 lb 9.6 oz (110.5 kg)   SpO2 95%   BMI 33.04 kg/m   Physical Examination:  Vitals:   10/04/20 1506  BP: 116/81  Pulse: 88  Temp: (!) 97.5 F (36.4 C)  TempSrc: Temporal  SpO2: 95%  Weight: 243 lb 9.6 oz (110.5 kg)  Height: 6' (1.829 m)   General: Vital signs reviewed.  Patient is well-developed and well-nourished,obese male  in no acute distress and cooperative with exam.  Head: Normocephalic and atraumatic. Eyes: EOMI, conjunctivae normal, no scleral icterus.  Neck: Supple, trachea midline, normal ROM, no JVD, masses, thyromegaly, or carotid bruit present.  Cardiovascular: RRR, S1 normal, S2 normal, no murmurs, gallops, or rubs. Pulmonary/Chest: Clear to auscultation bilaterally, no wheezes, rales, or rhonchi. Abdominal: Soft, non-tender, non-distended, BS +, no masses, organomegaly, or guarding present.  Musculoskeletal: No joint deformities, erythema, or stiffness, ROM full and nontender. Extremities: No lower extremity edema bilaterally,  pulses symmetric and intact bilaterally. No cyanosis or clubbing. Neurological: A&O x3, Strength is normal and symmetric bilaterally, cranial nerve II-XII are grossly intact, no focal motor deficit, sensory intact to light touch bilaterally.  Skin: Warm, dry and intact. No rashes or erythema. Psychiatric: Normal mood and affect. speech and behavior is  normal. Cognition and memory are normal.   Assessment/ Plan Taeshaun was seen today for annual exam and hypertension.  Diagnoses and all orders for this visit:  Chronic pain syndrome -     Ambulatory referral to Pain Clinic  Chronic bilateral low back pain, unspecified whether sciatica present Work on losing weight to help reduce back pain. May alternate with heat and ice application for pain relief. May also alternate with acetaminophen and Ibuprofen as prescribed for back pain. Other alternatives include massage, acupuncture and water aerobics.  You must stay active and avoid a sedentary lifestyle.   Essential hypertension Blood pressure is at goal  goal of less than 130/80, low-sodium, DASH diet, medication compliance, 150 minutes of moderate intensity exercise per week. Discussed medication compliance, adverse effects.  Clinical ASCVD: Yes  The 10-year ASCVD risk score Mikey Bussing DC Jr., et al., 2013) is: 9.7%   Values used to calculate the score:     Age: 38 years     Sex: Male     Is Non-Hispanic African American: Yes     Diabetic: No     Tobacco smoker: No     Systolic Blood Pressure: 818 mmHg     Is BP treated: Yes     HDL Cholesterol: 66 mg/dL     Total Cholesterol: 212 mg/dL  ASCVD risk factors include- [place]   This note has been created with Surveyor, quantity. Any transcriptional errors are unintentional.   Martin Perna, NP 10/04/2020, 3:26 PM

## 2020-10-04 NOTE — Patient Instructions (Signed)
Chronic Back Pain When back pain lasts longer than 3 months, it is called chronic back pain. Pain may get worse at certain times (flare-ups). There are things you can do at home to manage your pain. Follow these instructions at home: Pay attention to any changes in your symptoms. Take these actions to help with your pain: Managing pain and stiffness  If told, put ice on the painful area. Your doctor may tell you to use ice for 24-48 hours after the flare-up starts. To do this: ? Put ice in a plastic bag. ? Place a towel between your skin and the bag. ? Leave the ice on for 20 minutes, 2-3 times a day.  If told, put heat on the painful area. Do this as often as told by your doctor. Use the heat source that your doctor recommends, such as a moist heat pack or a heating pad. ? Place a towel between your skin and the heat source. ? Leave the heat on for 20-30 minutes. ? Take off the heat if your skin turns bright red. This is especially important if you are unable to feel pain, heat, or cold. You may have a greater risk of getting burned.  Soak in a warm bath. This can help relieve pain.      Activity  Avoid bending and other activities that make pain worse.  When standing: ? Keep your upper back and neck straight. ? Keep your shoulders pulled back. ? Avoid slouching.  When sitting: ? Keep your back straight. ? Relax your shoulders. Do not round your shoulders or pull them backward.  Do not sit or stand in one place for long periods of time.  Take short rest breaks during the day. Lying down or standing is usually better than sitting. Resting can help relieve pain.  When sitting or lying down for a long time, do some mild activity or stretching. This will help to prevent stiffness and pain.  Get regular exercise. Ask your doctor what activities are safe for you.  Do not lift anything that is heavier than 10 lb (4.5 kg) or the limit that you are told, until your doctor says that it  is safe.  To prevent injury when you lift things: ? Bend your knees. ? Keep the weight close to your body. ? Avoid twisting.  Sleep on a firm mattress. Try lying on your side with your knees slightly bent. If you lie on your back, put a pillow under your knees.   Medicines  Treatment may include medicines for pain and swelling taken by mouth or put on the skin, prescription pain medicine, or muscle relaxants.  Take over-the-counter and prescription medicines only as told by your doctor.  Ask your doctor if the medicine prescribed to you: ? Requires you to avoid driving or using machinery. ? Can cause trouble pooping (constipation). You may need to take these actions to prevent or treat trouble pooping:  Drink enough fluid to keep your pee (urine) pale yellow.  Take over-the-counter or prescription medicines.  Eat foods that are high in fiber. These include beans, whole grains, and fresh fruits and vegetables.  Limit foods that are high in fat and sugars. These include fried or sweet foods. General instructions  Do not use any products that contain nicotine or tobacco, such as cigarettes, e-cigarettes, and chewing tobacco. If you need help quitting, ask your doctor.  Keep all follow-up visits as told by your doctor. This is important. Contact a doctor if:    Your pain does not get better with rest or medicine.  Your pain gets worse, or you have new pain.  You have a high fever.  You lose weight very quickly.  You have trouble doing your normal activities. Get help right away if:  One or both of your legs or feet feel weak.  One or both of your legs or feet lose feeling (have numbness).  You have trouble controlling when you poop (have a bowel movement) or pee (urinate).  You have bad back pain and: ? You feel like you may vomit (nauseous), or you vomit. ? You have pain in your belly (abdomen). ? You have shortness of breath. ? You faint. Summary  When back pain  lasts longer than 3 months, it is called chronic back pain.  Pain may get worse at certain times (flare-ups).  Use ice and heat as told by your doctor. Your doctor may tell you to use ice after flare-ups. This information is not intended to replace advice given to you by your health care provider. Make sure you discuss any questions you have with your health care provider. Document Revised: 06/03/2019 Document Reviewed: 06/03/2019 Elsevier Patient Education  2021 Elsevier Inc.  

## 2020-10-06 ENCOUNTER — Other Ambulatory Visit: Payer: Self-pay

## 2020-10-06 ENCOUNTER — Ambulatory Visit
Admission: RE | Admit: 2020-10-06 | Discharge: 2020-10-06 | Disposition: A | Discharge status: Home / Self Care | Payer: 59 | Source: Ambulatory Visit | Attending: Diagnostic Neuroimaging | Admitting: Diagnostic Neuroimaging

## 2020-10-06 DIAGNOSIS — R519 Headache, unspecified: Secondary | ICD-10-CM

## 2020-10-06 MED ORDER — GADOBENATE DIMEGLUMINE 529 MG/ML IV SOLN
20.0000 mL | Freq: Once | INTRAVENOUS | Status: AC | PRN
  Administered 2020-10-06: 20 mL via INTRAVENOUS

## 2020-10-10 ENCOUNTER — Ambulatory Visit (INDEPENDENT_AMBULATORY_CARE_PROVIDER_SITE_OTHER): Payer: 59 | Admitting: Primary Care

## 2020-10-10 ENCOUNTER — Encounter (INDEPENDENT_AMBULATORY_CARE_PROVIDER_SITE_OTHER): Payer: Self-pay | Admitting: Primary Care

## 2020-10-10 ENCOUNTER — Other Ambulatory Visit: Payer: Self-pay

## 2020-10-10 VITALS — BP 126/82 | HR 94 | Temp 97.9°F | Ht 72.0 in | Wt 246.2 lb

## 2020-10-10 DIAGNOSIS — Z6833 Body mass index (BMI) 33.0-33.9, adult: Secondary | ICD-10-CM

## 2020-10-10 DIAGNOSIS — Z0000 Encounter for general adult medical examination without abnormal findings: Secondary | ICD-10-CM | POA: Diagnosis not present

## 2020-10-10 DIAGNOSIS — I10 Essential (primary) hypertension: Secondary | ICD-10-CM

## 2020-10-10 DIAGNOSIS — D1779 Benign lipomatous neoplasm of other sites: Secondary | ICD-10-CM | POA: Diagnosis not present

## 2020-10-10 MED ORDER — LOSARTAN POTASSIUM 100 MG PO TABS: 1.0000 | ORAL_TABLET | Freq: Every day | ORAL | 1 refills | Status: DC

## 2020-10-10 MED ORDER — CARVEDILOL 25 MG PO TABS: 25.0000 mg | ORAL_TABLET | Freq: Two times a day (BID) | ORAL | 0 refills | Status: DC

## 2020-10-10 NOTE — Patient Instructions (Signed)
A lipoma is a lump of fatty tissue that grows just under the skin. Lipomas move easily when you touch them and feel rubbery, not hard. Most lipomas aren't painful and don't cause health problems so they rarely need treatment. If a lipoma is bothering you, your provider can remove it.

## 2020-10-10 NOTE — Progress Notes (Signed)
Patient ID: [name], male    DOB: 1960/12/02  MRN: [id number]   Annual Wellness Visit  [name] is a 60 y.o. Male who presents for an Annual Wellness Visit. ? Patient Active Problem List   Diagnosis Date Noted   Chest pain of uncertain etiology 22/97/9892   Alcoholic pancreatitis 11/94/1740   Normal anion gap metabolic acidosis 81/44/8185   Radiculopathy 03/27/2018   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/02/2017   Renal stone 10/08/2016   Non-compliant behavior 12/02/2013   Routine health maintenance 01/27/2012   Hypertriglyceridemia 01/27/2012   Knee pain, left 11/12/2011   BPH (benign prostatic hyperplasia) 11/12/2011   GERD 08/10/2008   Essential hypertension 04/14/2008   Alcohol abuse 10/26/2007   TRIGGER FINGER, LEFT MIDDLE 05/12/2007   NEPHROLITHIASIS, HX OF 05/12/2007    Current Outpatient Medications on File Prior to Visit  Medication Sig Dispense Refill   acetaminophen (TYLENOL) 500 MG tablet Take 2 tablets (1,000 mg total) by mouth every 6 (six) hours as needed. 30 tablet 0   amitriptyline (ELAVIL) 25 MG tablet Take 1 tablet (25 mg total) by mouth at bedtime. 30 tablet 2   amLODipine (NORVASC) 10 MG tablet Take 1 tablet (10 mg total) by mouth daily. 90 tablet 0   aspirin 81 MG EC tablet TAKE 1 TABLET (81 MG TOTAL) BY MOUTH DAILY. SWALLOW WHOLE. (Patient taking differently: Take by mouth 5 (five) times daily. SWALLOW WHOLE.) 90 tablet 3   atorvastatin (LIPITOR) 40 MG tablet Take 1 tablet by mouth once daily 90 tablet 2   FIBER PO Take 1 capsule by mouth daily.     ibuprofen (ADVIL) 600 MG tablet Take 1 tablet (600 mg total) by mouth every 6 (six) hours as needed. 30 tablet 0   Multiple Vitamins-Minerals (MULTIVITAMIN WITH MINERALS) tablet Take 1 tablet by mouth daily.     omeprazole (PRILOSEC) 20 MG capsule Take 1 capsule (20 mg total) by mouth daily. 30 capsule 2   Rimegepant Sulfate (NURTEC) 75 MG TBDP Take 75 mg by mouth daily as needed. 8 tablet 6    rizatriptan (MAXALT-MLT) 10 MG disintegrating tablet Take 1 tablet (10 mg total) by mouth as needed for migraine. May repeat in 2 hours if needed 9 tablet 11   Tetrahydrozoline HCl (VISINE OP) Place 1 drop into both eyes daily as needed (redness).     traZODone (DESYREL) 100 MG tablet TAKE 1 TABLET (100 MG TOTAL) BY MOUTH AT BEDTIME. 90 tablet 1   triamcinolone cream (KENALOG) 0.1 % Apply 1 application topically 2 (two) times daily. 60 g 0   [DISCONTINUED] diphenhydrAMINE (BENADRYL) 25 MG tablet Take 1 tablet (25 mg total) by mouth every 6 (six) hours as needed. 30 tablet 0   [DISCONTINUED] metoCLOPramide (REGLAN) 10 MG tablet Take 1 tablet (10 mg total) by mouth every 6 (six) hours as needed for nausea (nausea/headache). Take this tablet with 25 mg of Benadryl and two extra strength Tylenol for headache. 12 tablet 0   No current facility-administered medications on file prior to visit.    Allergies  Allergen Reactions   Hydrocodone-Acetaminophen Palpitations   Shrimp [Shellfish Allergy] Other (See Comments)    Cholesterol level becomes very high. Patient reports sweating and itching.     Health Risk Assessment The patient has completed a Health Risk Assessment. This has been reviewed with the patient and has been scanned into the [facility] system as a separate document.   Current Medical Providers and Suppliers The providers who are  involved in the care of this patient are listed above. Additional providers and suppliers are listed below: Penumalli, Earlean Polka, MD- Neurology     Marilynn Rail, Jossie Ng, NP- Cardiology   Age-appropriate Screening Schedule Refer to the list in the Health Maintenance section for an age appropriated screening completed by this patient. Additional screening recommendations are listed below in the plan section. The patient has been provided with a written plan.    Health Maintenance Due  Topic Date Due   Pneumococcal Vaccine 84-53 Years old (1 - PCV) Never done   Zoster  Vaccines- Shingrix (1 of 2) Never done   COVID-19 Vaccine (2 - Pfizer series) 02/19/2020    Depression Screen Over the past two weeks have you:     Felt down or depressed? no     Had little interest or pleasure in doing things? no     depression    Functional Ability/Safety Screen 1. Falls Risk: Does the patient need assistance with ambulation? no Does the patient have a history of a fall in the last 90 days? no Is the patient at risk for falls? no Hearing Evaluation:     Do you have trouble hearing the television when others do not? yes     Do you have to strain to hear/understand conversations? yes  Cognitive Assessment: Does the patient have evidence of cognitive impairment? No The patient does not have evidence of a change in mood/affect, appearance, speech, memory or motor skills.   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 PHYSICAL EXAM: Vitals:   10/10/20 1504  BP: 126/82  Pulse: 94  Temp: 97.9 F (36.6 C)  TempSrc: Temporal  SpO2: 97%  Weight: 246 lb 3.2 oz (111.7 kg)  Height: 6' (1.829 m)   Body mass index is 33.39 kg/m. Patient is w General appearance - alert, well appearing, and in acute distress  Chronic back pain and obese Vital signs reviewed. Cooperative with exam.  Head: Normocephalic and atraumatic. Eyes: EOMI, conjunctivae normal, no scleral icterus.  Neck: Supple, trachea midline, normal ROM, no JVD, masses, thyromegaly, or carotid bruit present.  Cardiovascular: RRR, S1 normal, S2 normal, no murmurs, gallops, or rubs. Pulmonary/Chest: Clear to auscultation bilaterally, no wheezes, rales, or rhonchi. Abdominal: Soft, non-tender, non-distended, BS +, no masses, organomegaly, or guarding present.  Musculoskeletal: No joint deformities, erythema, or stiffness, ROM full and nontender. Extremities: No lower extremity edema  bilaterally,  pulses symmetric and intact bilaterally. No cyanosis or clubbing. Neurological: A&O x3, Strength is normal and symmetric bilaterally Skin: Warm, dry and intact. No rashes or erythema. Psychiatric: Normal mood and affect. speech and behavior is normal. Cognition and memory are normal.    ASSESSMENT AND PLAN: Patient Self-Management and Personalized Health Advice   During the course of the visit the patient was educated and counseled about appropriate screening and preventive services including:            Pneumococcal vaccine - declined      Influenza vaccine -declined   Discussed the patient's BMI with him. The BMI BMI is not in the acceptable range; no BMI management plan is appropriate. Due to chronic back pain    Orders placed during this encounter include:   Return for keep schedule appt.     ? An after visit summary with all of these plans was given to the patient.

## 2020-10-13 ENCOUNTER — Telehealth (INDEPENDENT_AMBULATORY_CARE_PROVIDER_SITE_OTHER): Payer: Self-pay

## 2020-10-13 NOTE — Telephone Encounter (Signed)
Copied from [place] [id number]. Topic: General - Other >> Oct 13, 2020  2:43 PM Antonieta Iba C wrote: Reason for CRM: pt called in for assistance. Pt says that he had a cpe and need to have his results faxed.   [facility]   ATTN: June Roselie Awkward - fax# [phone]   Please assist  Please close patient office visit note in order to be able to fax.  Patient also made aware he would need to come into the office to sign a medical release form or have [facility] request the office visit notes.

## 2020-10-18 ENCOUNTER — Telehealth: Payer: Self-pay | Admitting: Diagnostic Neuroimaging

## 2020-10-18 NOTE — Telephone Encounter (Signed)
Called patient and informed him Dr Leta Baptist is out of office until Monday. He stated he doesn't want to wait until Mon, MRI was done several days ago. I advised will route to the work in [place] to send me a result note. Patient verbalized understanding, appreciation.

## 2020-10-18 NOTE — Telephone Encounter (Signed)
Please call patient and advise him that his brain MRI with and without contrast from 10/06/2020 showed no acute findings.  He had a normal contrast uptake, no obvious cause for headaches.  Brain volume or brain size is slightly smaller than expected for age, this is not an acute or specific finding.  He can follow-up with Dr. Leta Baptist or his nurse practitioner as scheduled.  I will send a copy of this note to Dr. Leta Baptist so he can review upon his return and make additional recommendations as necessary.

## 2020-10-18 NOTE — Telephone Encounter (Signed)
Called patient and reviewed Dr Guadelupe Sabin result note. He stated he hasn't been taking amitriptyline nightly, has used Nurtec as needed for headaches. He stated he has about 2 a week but not sure if they are caused by what he eats or alcohol.  I advised amitriptyline taken nightly  is to prevent headaches; he stated he'll "try it". He had no further questions. Patient verbalized understanding, appreciation.

## 2020-10-18 NOTE — Telephone Encounter (Signed)
Pt called and LVM wanting to know when he will be called with his MRI results. Please advise.

## 2020-10-19 ENCOUNTER — Other Ambulatory Visit: Payer: Self-pay

## 2020-10-19 ENCOUNTER — Ambulatory Visit
Admission: EM | Admit: 2020-10-19 | Discharge: 2020-10-19 | Disposition: A | Discharge status: Home / Self Care | Payer: 59 | Attending: Emergency Medicine | Admitting: Emergency Medicine

## 2020-10-19 ENCOUNTER — Ambulatory Visit (INDEPENDENT_AMBULATORY_CARE_PROVIDER_SITE_OTHER): Payer: 59

## 2020-10-19 DIAGNOSIS — S93402A Sprain of unspecified ligament of left ankle, initial encounter: Secondary | ICD-10-CM

## 2020-10-19 DIAGNOSIS — S93492A Sprain of other ligament of left ankle, initial encounter: Secondary | ICD-10-CM | POA: Diagnosis not present

## 2020-10-19 MED ORDER — IBUPROFEN 600 MG PO TABS: 600.0000 mg | ORAL_TABLET | Freq: Four times a day (QID) | ORAL | 0 refills | Status: DC | PRN

## 2020-10-19 NOTE — ED Provider Notes (Signed)
[facility]    CSN: [id number] Arrival date & time: 10/19/20  1329      History   Chief Complaint Chief Complaint  Patient presents with   Ankle Pain     HPI [name] is a 60 y.o. male history of hypertension, GERD, presenting today for ankle injury.  Reports 1 week ago stepped off a curb and twisted left ankle.  Since has had intermittent pain and swelling.  Pain with weightbearing.  Symptoms have not resolved with rest elevation, warm soaks.  Denies history of prior fractures  HPI  Past Medical History:  Diagnosis Date   Alcohol abuse    Anxiety    Arthritis    Diverticulosis of colon (without mention of hemorrhage)    GERD (gastroesophageal reflux disease)    History of kidney stones    Hyperlipemia    Hypertension    dr Ronalee Belts  norins   Kidney stones    Nephrolithiasis    Pancreatitis    Hx of    Ureteral stent displacement [facility]) 1990    Patient Active Problem List   Diagnosis Date Noted   Chest pain of uncertain etiology 09/47/0962   Alcoholic pancreatitis 83/66/2947   Normal anion gap metabolic acidosis 65/46/5035   Radiculopathy 03/27/2018   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/02/2017   Renal stone 10/08/2016   Non-compliant behavior 12/02/2013   Routine health maintenance 01/27/2012   Hypertriglyceridemia 01/27/2012   Knee pain, left 11/12/2011   BPH (benign prostatic hyperplasia) 11/12/2011   GERD 08/10/2008   Essential hypertension 04/14/2008   Alcohol abuse 10/26/2007   TRIGGER FINGER, LEFT MIDDLE 05/12/2007   NEPHROLITHIASIS, HX OF 05/12/2007    Past Surgical History:  Procedure Laterality Date   A2 pulley flexor sheath cyst 4th finger excisional biopsy     '03   ANTERIOR CRUCIATE LIGAMENT REPAIR     R knee   ESOPHAGOGASTRODUODENOSCOPY N/A 07/09/2012   Procedure: ESOPHAGOGASTRODUODENOSCOPY (EGD);  Surgeon: Inda Castle, MD;  Location: [facility];  Service: Endoscopy;  Laterality: N/A;   KNEE SURGERY  2008   LEFT  HEART CATH AND CORONARY ANGIOGRAPHY N/A 08/10/2019   Procedure: LEFT HEART CATH AND CORONARY ANGIOGRAPHY;  Surgeon: Lorretta Harp, MD;  Location: [place] [facility];  Service: Cardiovascular;  Laterality: N/A;   SHOULDER ARTHROSCOPY WITH SUBACROMIAL DECOMPRESSION AND OPEN ROTATOR C Right 12/26/2012   Procedure: RIGHT SHOULDER ARTHROSCOPY WITH SUBACROMIAL DECOMPRESSION MINI OPEN ROTATOR CUFF REPAIR, OPEN BICEP TENODESIS OPEN DISTAL CLAVICLE RESECTION  ;  Surgeon: Augustin Schooling, MD;  Location: [place];  Service: Orthopedics;  Laterality: Right;   TRANSFORAMINAL LUMBAR INTERBODY FUSION (TLIF) WITH PEDICLE SCREW FIXATION 1 LEVEL Right 03/27/2018   Procedure: RIGHT-SIDED LUMBAR 4-5 TRANSFORAMINAL LUMBAR INTERBODY FUSION WITH INSTRUMENTATION AND ALLOGRAFT;  Surgeon: Phylliss Bob, MD;  Location: [place];  Service: Orthopedics;  Laterality: Right;   URETEROSCOPY     and stone retreval       Home Medications    Prior to Admission medications   Medication Sig Start Date End Date Taking? Authorizing Provider  ibuprofen (ADVIL) 600 MG tablet Take 1 tablet (600 mg total) by mouth every 6 (six) hours as needed. 10/19/20  Yes Legend Pecore C, PA-C  acetaminophen (TYLENOL) 500 MG tablet Take 2 tablets (1,000 mg total) by mouth every 6 (six) hours as needed. 04/04/20   Charlesetta Shanks, MD  amitriptyline (ELAVIL) 25 MG tablet Take 1 tablet (25 mg total) by mouth at bedtime. 09/20/20  Penumalli, Earlean Polka, MD  amLODipine (NORVASC) 10 MG tablet Take 1 tablet (10 mg total) by mouth daily. 09/18/20   [name], NP  aspirin 81 MG EC tablet TAKE 1 TABLET (81 MG TOTAL) BY MOUTH DAILY. SWALLOW WHOLE. Patient taking differently: Take by mouth 5 (five) times daily. SWALLOW WHOLE. 06/30/20 06/30/21  [name], NP  atorvastatin (LIPITOR) 40 MG tablet Take 1 tablet by mouth once daily 09/19/20   Lorretta Harp, MD  carvedilol (COREG) 25 MG tablet Take 1 tablet (25 mg total) by mouth 2 (two) times daily  with a meal. 10/10/20   [name], NP  FIBER PO Take 1 capsule by mouth daily.    [provider]  losartan (COZAAR) 100 MG tablet Take 1 tablet (100 mg total) by mouth daily. 10/10/20   [name], NP  Multiple Vitamins-Minerals (MULTIVITAMIN WITH MINERALS) tablet Take 1 tablet by mouth daily.    [provider]  omeprazole (PRILOSEC) 20 MG capsule Take 1 capsule (20 mg total) by mouth daily. 08/19/20   [name], NP  Rimegepant Sulfate (NURTEC) 75 MG TBDP Take 75 mg by mouth daily as needed. 09/19/20   Penumalli, Earlean Polka, MD  rizatriptan (MAXALT-MLT) 10 MG disintegrating tablet Take 1 tablet (10 mg total) by mouth as needed for migraine. May repeat in 2 hours if needed 09/19/20   Penumalli, Earlean Polka, MD  Tetrahydrozoline HCl (VISINE OP) Place 1 drop into both eyes daily as needed (redness).    [provider]  traZODone (DESYREL) 100 MG tablet TAKE 1 TABLET (100 MG TOTAL) BY MOUTH AT BEDTIME. 06/30/20 06/30/21  [name], NP  triamcinolone cream (KENALOG) 0.1 % Apply 1 application topically 2 (two) times daily. 09/08/19   Scot Jun, FNP  diphenhydrAMINE (BENADRYL) 25 MG tablet Take 1 tablet (25 mg total) by mouth every 6 (six) hours as needed. 04/04/20 06/17/20  Charlesetta Shanks, MD  metoCLOPramide (REGLAN) 10 MG tablet Take 1 tablet (10 mg total) by mouth every 6 (six) hours as needed for nausea (nausea/headache). Take this tablet with 25 mg of Benadryl and two extra strength Tylenol for headache. 04/27/20 06/17/20  [name], NP    Family History Family History  Problem Relation Age of Onset   Hypertension Father        X4 siblings   Heart disease Father    Diabetes Mother        x2 brothers    Social History Social History   Tobacco Use   Smoking status: Former    Pack years: 0.00    Types: Cigarettes    Quit date: 07/10/1998    Years since quitting: 22.2   Smokeless tobacco: Never  Vaping Use   Vaping Use:  Never used  Substance Use Topics   Alcohol use: Yes    Alcohol/week: 6.0 standard drinks    Types: 6 Cans of beer per week    Comment: 1 6 pack a week   Drug use: No     Allergies   Hydrocodone-acetaminophen and Shrimp [shellfish allergy]   Review of Systems Review of Systems  Constitutional:  Negative for fatigue and fever.  Eyes:  Negative for redness, itching and visual disturbance.  Respiratory:  Negative for shortness of breath.   Cardiovascular:  Negative for chest pain and leg swelling.  Gastrointestinal:  Negative for nausea and vomiting.  Musculoskeletal:  Positive for arthralgias, gait problem and joint swelling. Negative for myalgias.  Skin:  Negative for color change, rash and wound.  Neurological:  Negative for dizziness, syncope, weakness, light-headedness and headaches.    Physical Exam Triage Vital Signs ED Triage Vitals  Enc Vitals Group     BP      Pulse      Resp      Temp      Temp src      SpO2      Weight      Height      Head Circumference      Peak Flow      Pain Score      Pain Loc      Pain Edu?      Excl. in [place]?    No data found.  Updated Vital Signs BP 117/80 (BP Location: Left Arm)   Pulse 77   Temp 98.9 F (37.2 C) (Oral)   Resp 20   SpO2 96%   Visual Acuity Right Eye Distance:   Left Eye Distance:   Bilateral Distance:    Right Eye Near:   Left Eye Near:    Bilateral Near:     Physical Exam Vitals and nursing note reviewed.  Constitutional:      Appearance: He is well-developed.     Comments: No acute distress  HENT:     Head: Normocephalic and atraumatic.     Nose: Nose normal.  Eyes:     Conjunctiva/sclera: Conjunctivae normal.  Cardiovascular:     Rate and Rhythm: Normal rate.  Pulmonary:     Effort: Pulmonary effort is normal. No respiratory distress.  Abdominal:     General: There is no distension.  Musculoskeletal:        General: Normal range of motion.     Cervical back: Neck supple.      Comments: GoalLeft ankle/foot: Moderate swelling noted about lateral aspect without overlying erythema, no warmth, tender to palpation to lateral malleolus extending slightly anteriorly into proximal dorsum of foot, nontender distally, full active range of motion although does elicit some pain, dorsalis pedis 2+  Nontender proximally to lateral fibula or knee  Skin:    General: Skin is warm and dry.  Neurological:     Mental Status: He is alert and oriented to person, place, and time.     UC Treatments / Results  Labs (all labs ordered are listed, but only abnormal results are displayed) Labs Reviewed - No data to display  EKG   Radiology DG Ankle Complete Left  Result Date: 10/19/2020 CLINICAL DATA:  Ankle injury. Twisted left ankle a week ago now with persistent pain. EXAM: LEFT ANKLE COMPLETE - 3+ VIEW COMPARISON:  None. FINDINGS: Mild diffuse soft tissue swelling. No underlying fracture or dislocation. 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 IMPRESSION: 1. Soft tissue swelling. 2. No acute bone abnormality. Electronically Signed   By: Kerby Moors M.D.   On: 10/19/2020 14:32    Procedures Procedures (including critical care time)  Medications Ordered in UC Medications - No data to display  Initial Impression / Assessment and Plan / UC Course  I have reviewed the triage vital signs and the nursing notes.  Pertinent labs & imaging results that were available during my care of the patient were reviewed by me and considered in my medical decision making (see chart for details).     X-ray negative for acute bony abnormality, no signs of fracture, will continue treatment with sprain, providing ASO ankle brace recommending  continued ice elevation and recommending gentle range of motion exercises daily.  Follow-up with sports medicine if persistent.  Discussed strict return precautions. Patient verbalized understanding and is agreeable with  plan.  Final Clinical Impressions(s) / UC Diagnoses   Final diagnoses:  Sprain of anterior talofibular ligament of left ankle, initial encounter     Discharge Instructions      Xray without fracture Use anti-inflammatories for pain/swelling. You may take up to 600 mg Ibuprofen every 8 hours with food. You may supplement Ibuprofen with Tylenol [phone] mg every 8 hours.  Continue to ice and elevate Gentle Range of motion exercises daily- see attached Follow up with sports medicine if perisitng      ED Prescriptions     Medication Sig Dispense Auth. Provider   ibuprofen (ADVIL) 600 MG tablet Take 1 tablet (600 mg total) by mouth every 6 (six) hours as needed. 30 tablet Graceson Nichelson, [place] C, PA-C      PDMP not reviewed this encounter.   Janith Lima, [place] 10/19/20 1456

## 2020-10-19 NOTE — Discharge Instructions (Addendum)
Xray without fracture Use anti-inflammatories for pain/swelling. You may take up to 600 mg Ibuprofen every 8 hours with food. You may supplement Ibuprofen with Tylenol [phone] every 8 hours.  Continue to ice and elevate Gentle Range of motion exercises daily- see attached Follow up with sports medicine if perisitng

## 2020-10-19 NOTE — ED Triage Notes (Signed)
Pt states stepped of a curb wrong and twisted lt ankle a week ago and still having pain.

## 2020-11-22 ENCOUNTER — Ambulatory Visit: Payer: 59 | Admitting: Cardiovascular Disease

## 2020-12-07 ENCOUNTER — Other Ambulatory Visit (INDEPENDENT_AMBULATORY_CARE_PROVIDER_SITE_OTHER): Payer: Self-pay | Admitting: Primary Care

## 2020-12-07 DIAGNOSIS — Z760 Encounter for issue of repeat prescription: Secondary | ICD-10-CM

## 2020-12-07 NOTE — Telephone Encounter (Signed)
Requested Prescriptions  Pending Prescriptions Disp Refills  . omeprazole (PRILOSEC) 20 MG capsule [Pharmacy Med Name: Omeprazole 20 MG Oral Capsule Delayed Release] 30 capsule 2    Sig: Take 1 capsule by mouth once daily     Gastroenterology: Proton Pump Inhibitors Passed - 12/07/2020  5:30 AM      Passed - Valid encounter within last 12 months    Recent Outpatient Visits          1 month ago Annual physical exam   [place], [place], NP   2 months ago Essential hypertension   [place], Michelle P, NP   5 months ago Hospital discharge follow-up   [place] Juluis Mire P, NP   9 months ago Persistent migraine aura without cerebral infarction and without status migrainosus, not intractable   [facility] [name], NP   1 year ago Elevated glucose   [place] Scot Jun, FNP      Future Appointments            In 4 weeks [name], NP [place]   In 2 months Gwenlyn Found, Pearletha Forge, MD [facility]

## 2020-12-18 ENCOUNTER — Other Ambulatory Visit (INDEPENDENT_AMBULATORY_CARE_PROVIDER_SITE_OTHER): Payer: Self-pay | Admitting: Primary Care

## 2020-12-18 DIAGNOSIS — I10 Essential (primary) hypertension: Secondary | ICD-10-CM

## 2020-12-18 NOTE — Telephone Encounter (Signed)
Requested Prescriptions  Pending Prescriptions Disp Refills  . amLODipine (NORVASC) 10 MG tablet [Pharmacy Med Name: amLODIPine Besylate 10 MG Oral Tablet] 90 tablet 0    Sig: Take 1 tablet by mouth once daily     Cardiovascular:  Calcium Channel Blockers Passed - 12/18/2020 11:00 AM      Passed - Last BP in normal range    BP Readings from Last 1 Encounters:  10/19/20 117/80         Passed - Valid encounter within last 6 months    Recent Outpatient Visits          2 months ago Annual physical exam   [place], [place], NP   2 months ago Essential hypertension   [place], Michelle P, NP   5 months ago Hospital discharge follow-up   [place] Juluis Mire P, NP   9 months ago Persistent migraine aura without cerebral infarction and without status migrainosus, not intractable   [facility] [name], NP   1 year ago Elevated glucose   [place] Scot Jun, FNP      Future Appointments            In 2 weeks Oletta Lamas [place] Cage, NP [place]   In 2 months Gwenlyn Found, Pearletha Forge, MD [place] [facility]

## 2021-01-04 ENCOUNTER — Encounter (INDEPENDENT_AMBULATORY_CARE_PROVIDER_SITE_OTHER): Payer: Self-pay | Admitting: Primary Care

## 2021-01-04 ENCOUNTER — Other Ambulatory Visit: Payer: Self-pay

## 2021-01-04 ENCOUNTER — Ambulatory Visit (INDEPENDENT_AMBULATORY_CARE_PROVIDER_SITE_OTHER): Payer: 59 | Admitting: Primary Care

## 2021-01-04 VITALS — BP 115/77 | HR 81 | Temp 97.7°F | Ht 72.0 in | Wt 246.4 lb

## 2021-01-04 DIAGNOSIS — F5101 Primary insomnia: Secondary | ICD-10-CM | POA: Diagnosis not present

## 2021-01-04 DIAGNOSIS — Z6833 Body mass index (BMI) 33.0-33.9, adult: Secondary | ICD-10-CM

## 2021-01-04 DIAGNOSIS — Z760 Encounter for issue of repeat prescription: Secondary | ICD-10-CM

## 2021-01-04 DIAGNOSIS — G8929 Other chronic pain: Secondary | ICD-10-CM | POA: Diagnosis not present

## 2021-01-04 DIAGNOSIS — M545 Low back pain, unspecified: Secondary | ICD-10-CM

## 2021-01-04 DIAGNOSIS — I10 Essential (primary) hypertension: Secondary | ICD-10-CM | POA: Diagnosis not present

## 2021-01-04 MED ORDER — CYCLOBENZAPRINE HCL 10 MG PO TABS
10.0000 mg | ORAL_TABLET | Freq: Three times a day (TID) | ORAL | 0 refills | Status: DC | PRN
Start: 2021-01-04 — End: 2021-08-09

## 2021-01-04 MED ORDER — CARVEDILOL 25 MG PO TABS: 25.0000 mg | ORAL_TABLET | Freq: Two times a day (BID) | ORAL | 0 refills | Status: DC

## 2021-01-04 MED ORDER — TRAZODONE HCL 100 MG PO TABS: ORAL_TABLET | Freq: Every day | ORAL | 1 refills | Status: DC

## 2021-01-04 NOTE — Patient Instructions (Signed)

## 2021-01-04 NOTE — Progress Notes (Signed)
[facility]   Subjective:    [name] is a 60 y.o. obese male who presents for evaluation of low back pain. The patient has had recurrent self limited episodes of low back pain in the past. Symptoms have been present for 3 months and are gradually worsening.  Onset was related to / precipitated by no known injury. The pain is located in the right lumbar area and does not radiate. The pain is described as aching and sharp and occurs all day. He rates his pain as a 10 on a scale of 0-10 and severe. Symptoms are exacerbated by lifting, sitting, and standing. Symptoms are improved by muscle relaxants. He has also tried heat, ice, muscle relaxants, and rest which provided no symptom relief. He has no other symptoms associated with the back pain. The patient has no "red flag" history indicative of complicated back pain. He is also following up for hypertension unremarkable today. Takes medication as prescribed. Denies shortness of breath, headaches, chest pain or lower extremity edema  The following portions of the patient's history were reviewed and updated as appropriate: allergies, current medications, past family history, past medical history, past social history, past surgical history, and problem list.  Review of Systems Pertinent items noted in HPI and remainder of comprehensive ROS otherwise negative.    Objective:  BP 115/77 (BP Location: Right Arm, Patient Position: Sitting, Cuff Size: Normal)   Pulse 81   Temp 97.7 F (36.5 C) (Temporal)   Ht 6' (1.829 m)   Wt 246 lb 6.4 oz (111.8 kg)   SpO2 97%   BMI 33.42 kg/m    Physical Exam General: Vital signs reviewed.  Patient is well-developed and well-nourished, obese male in no acute distress and cooperative with exam.  Head: Normocephalic and atraumatic. Ears bilateral cerumen impaction  Eyes: EOMI, conjunctivae normal, no scleral icterus.  Neck: Supple, trachea midline, normal ROM, no JVD, masses,  thyromegaly, or carotid bruit present.  Cardiovascular: RRR, S1 normal, S2 normal, no murmurs, gallops, or rubs. Pulmonary/Chest: Clear to auscultation bilaterally, no wheezes, rales, or rhonchi. Abdominal: Soft, non-tender, non-distended, BS +, no masses, organomegaly, or guarding present.  Musculoskeletal: No joint deformities, erythema, or stiffness, ROM full and nontender. Extremities: No lower extremity edema bilaterally,  pulses symmetric and intact bilaterally. No cyanosis or clubbing. Neurological: A&O x3, Strength is normal and symmetric bilaterally, cranial nerve II-XII are grossly intact, no focal motor deficit, sensory intact to light touch bilaterally.  Skin: Warm, dry and intact. No rashes or erythema. Psychiatric: Normal mood and affect. speech and behavior is normal. Cognition and memory are normal.   Assessment:  [name] was seen today for blood pressure check.  Diagnoses and all orders for this visit:  Essential hypertension Blood pressure is at goal of less than 130/80, low-sodium, DASH diet, medication compliance, 150 minutes of moderate intensity exercise per week. Discussed medication compliance, adverse effects.     carvedilol (COREG) 25 MG tablet; Take 1 tablet (25 mg total) by mouth 2 (two) times daily with a meal  Adult BMI 33.0-33.9 kg/sq m Obesity is 30-39 indicating an excess in caloric intake or underlining conditions. This may lead to other co-morbidities. Lifestyle modifications of diet and exercise may reduce obesity.    Chronic bilateral low back pain, unspecified whether sciatica present  Straight leg raise: positive at 45 degrees on the right.  Sciatica possibly due to intervertebral disc herniation at L5-S1   Natural history and expected course discussed. Questions answered.  Muscle relaxants per medication orders. Pain clinic referral due to chronic nature of patient's symptoms.  Previously referred but clinic would not accept his insurance. Patient to  call insurance and find out what pain clinic will take his insurance and he will be referred there.  cyclobenzaprine (FLEXERIL) 10 MG tablet; Take 1 tablet (10 mg total) by mouth 3 (three) times daily as needed for muscle spasms.  Primary insomnia -     traZODone (DESYREL) 100 MG tablet; TAKE 1 TABLET (100 MG TOTAL) BY MOUTH AT BEDTIME. This note has been created with Surveyor, quantity. Any transcriptional errors are unintentional.  [name]

## 2021-01-11 ENCOUNTER — Other Ambulatory Visit: Payer: Self-pay

## 2021-01-11 ENCOUNTER — Encounter (HOSPITAL_COMMUNITY): Payer: Self-pay | Admitting: Student

## 2021-01-11 DIAGNOSIS — I10 Essential (primary) hypertension: Secondary | ICD-10-CM | POA: Diagnosis not present

## 2021-01-11 DIAGNOSIS — Z7982 Long term (current) use of aspirin: Secondary | ICD-10-CM | POA: Diagnosis not present

## 2021-01-11 DIAGNOSIS — R109 Unspecified abdominal pain: Secondary | ICD-10-CM | POA: Diagnosis not present

## 2021-01-11 DIAGNOSIS — D72829 Elevated white blood cell count, unspecified: Secondary | ICD-10-CM | POA: Insufficient documentation

## 2021-01-11 DIAGNOSIS — R110 Nausea: Secondary | ICD-10-CM | POA: Diagnosis not present

## 2021-01-11 DIAGNOSIS — Z87891 Personal history of nicotine dependence: Secondary | ICD-10-CM | POA: Insufficient documentation

## 2021-01-11 DIAGNOSIS — Z79899 Other long term (current) drug therapy: Secondary | ICD-10-CM | POA: Insufficient documentation

## 2021-01-11 NOTE — ED Provider Notes (Signed)
Emergency Medicine Provider Triage Evaluation Note  DOVER HERTZLER , a 60 y.o. male  was evaluated in triage.  Pt complains of right flank pain x 3 days. Radiates into the abdomen, no alleviating factors, worse with position changes. Having associated decreased UOP and nausea.   Review of Systems  Positive: Nausea, flank pain, decreased UOP Negative: Hematuria, fever, cough  Physical Exam  BP (!) 153/93   Pulse 88   Temp 99.9 F (37.7 C) (Oral)   Resp 18   Ht 6' (1.829 m)   Wt 113.4 kg   SpO2 91%   BMI 33.91 kg/m  Gen:   Awake, no distress   Resp:  Normal effort  MSK:   Moves extremities without difficulty  Other:  R CVA tenderness and R sided abdominal tenderness.   Medical Decision Making  Medically screening exam initiated at 11:58 PM.  Appropriate orders placed.  THEODORE OHORA was informed that the remainder of the evaluation will be completed by another provider, this initial triage assessment does not replace that evaluation, and the importance of remaining in the ED until their evaluation is complete.  Flank pain   Amaryllis Dyke, PA-C [id number]    Delora Fuel, MD [id number] [phone]

## 2021-01-11 NOTE — ED Triage Notes (Addendum)
Pt arrives EMS with c/o right sided flank pain x 3 days. Decreased urine output that is dark with foul odor.   EMS administered  '4mg'$  zofran 250cc NS 253mg fentanyl

## 2021-01-12 ENCOUNTER — Emergency Department (HOSPITAL_COMMUNITY): Payer: 59

## 2021-01-12 ENCOUNTER — Emergency Department (HOSPITAL_COMMUNITY)
Admission: EM | Admit: 2021-01-12 | Discharge: 2021-01-12 | Disposition: A | Discharge status: Home / Self Care | Payer: 59 | Attending: Student | Admitting: Student

## 2021-01-12 DIAGNOSIS — R109 Unspecified abdominal pain: Secondary | ICD-10-CM

## 2021-01-12 DIAGNOSIS — F1010 Alcohol abuse, uncomplicated: Secondary | ICD-10-CM

## 2021-01-12 LAB — CBC WITH DIFFERENTIAL/PLATELET
Abs Immature Granulocytes: 0.1 10*3/uL — ABNORMAL HIGH (ref 0.00–0.07)
Basophils Absolute: 0.1 10*3/uL (ref 0.0–0.1)
Basophils Relative: 0 %
Eosinophils Absolute: 0.1 10*3/uL (ref 0.0–0.5)
Eosinophils Relative: 1 %
HCT: 34.7 % — ABNORMAL LOW (ref 39.0–52.0)
Hemoglobin: 11.6 g/dL — ABNORMAL LOW (ref 13.0–17.0)
Immature Granulocytes: 1 %
Lymphocytes Relative: 19 %
Lymphs Abs: 2.4 10*3/uL (ref 0.7–4.0)
MCH: 31.2 pg (ref 26.0–34.0)
MCHC: 33.4 g/dL (ref 30.0–36.0)
MCV: 93.3 fL (ref 80.0–100.0)
Monocytes Absolute: 1.3 10*3/uL — ABNORMAL HIGH (ref 0.1–1.0)
Monocytes Relative: 10 %
Neutro Abs: 9.1 10*3/uL — ABNORMAL HIGH (ref 1.7–7.7)
Neutrophils Relative %: 69 %
Platelets: 229 10*3/uL (ref 150–400)
RBC: 3.72 MIL/uL — ABNORMAL LOW (ref 4.22–5.81)
RDW: 13 % (ref 11.5–15.5)
WBC: 13.1 10*3/uL — ABNORMAL HIGH (ref 4.0–10.5)
nRBC: 0 % (ref 0.0–0.2)

## 2021-01-12 LAB — URINALYSIS, ROUTINE W REFLEX MICROSCOPIC
Bacteria, UA: NONE SEEN
Bilirubin Urine: NEGATIVE
Glucose, UA: NEGATIVE mg/dL
Hgb urine dipstick: NEGATIVE
Ketones, ur: NEGATIVE mg/dL
Leukocytes,Ua: NEGATIVE
Nitrite: NEGATIVE
Protein, ur: 30 mg/dL — AB
Specific Gravity, Urine: 1.015 (ref 1.005–1.030)
pH: 5 (ref 5.0–8.0)

## 2021-01-12 LAB — COMPREHENSIVE METABOLIC PANEL
ALT: 28 U/L (ref 0–44)
AST: 22 U/L (ref 15–41)
Albumin: 4.4 g/dL (ref 3.5–5.0)
Alkaline Phosphatase: 39 U/L (ref 38–126)
Anion gap: 11 (ref 5–15)
BUN: 11 mg/dL (ref 6–20)
CO2: 24 mmol/L (ref 22–32)
Calcium: 8.2 mg/dL — ABNORMAL LOW (ref 8.9–10.3)
Chloride: 109 mmol/L (ref 98–111)
Creatinine, Ser: 0.79 mg/dL (ref 0.61–1.24)
GFR, Estimated: >60 mL/min (ref >60)
Glucose, Bld: 110 mg/dL — ABNORMAL HIGH (ref 70–99)
Potassium: 2.7 mmol/L — CL (ref 3.5–5.1)
Sodium: 144 mmol/L (ref 135–145)
Total Bilirubin: 1.4 mg/dL — ABNORMAL HIGH (ref 0.3–1.2)
Total Protein: 8 g/dL (ref 6.5–8.1)

## 2021-01-12 LAB — POTASSIUM: Potassium: 3 mmol/L — ABNORMAL LOW (ref 3.5–5.1)

## 2021-01-12 MED ORDER — ONDANSETRON HCL 4 MG/2ML IJ SOLN
4.0000 mg | Freq: Once | INTRAMUSCULAR | Status: AC
  Administered 2021-01-12: 4 mg via INTRAVENOUS
  Filled 2021-01-12: qty 2

## 2021-01-12 MED ORDER — POTASSIUM CHLORIDE CRYS ER 20 MEQ PO TBCR: 40.0000 meq | EXTENDED_RELEASE_TABLET | Freq: Two times a day (BID) | ORAL | 0 refills | Status: DC

## 2021-01-12 MED ORDER — MAGNESIUM OXIDE -MG SUPPLEMENT 400 (240 MG) MG PO TABS
800.0000 mg | ORAL_TABLET | Freq: Once | ORAL | Status: AC
  Administered 2021-01-12: 800 mg via ORAL
  Filled 2021-01-12: qty 2

## 2021-01-12 MED ORDER — LIDOCAINE 5 % EX PTCH
1.0000 | MEDICATED_PATCH | CUTANEOUS | Status: DC
  Administered 2021-01-12: 1 via TRANSDERMAL
  Filled 2021-01-12: qty 1

## 2021-01-12 MED ORDER — POTASSIUM CHLORIDE CRYS ER 20 MEQ PO TBCR
40.0000 meq | EXTENDED_RELEASE_TABLET | Freq: Once | ORAL | Status: AC
  Administered 2021-01-12: 40 meq via ORAL
  Filled 2021-01-12: qty 2

## 2021-01-12 MED ORDER — KETOROLAC TROMETHAMINE 15 MG/ML IJ SOLN
15.0000 mg | Freq: Once | INTRAMUSCULAR | Status: AC
  Administered 2021-01-12: 15 mg via INTRAVENOUS
  Filled 2021-01-12: qty 1

## 2021-01-12 MED ORDER — POTASSIUM CHLORIDE 2 MEQ/ML IV SOLN
INTRAVENOUS | Status: DC
  Filled 2021-01-12: qty 1000

## 2021-01-12 MED ORDER — ACETAMINOPHEN 500 MG PO TABS
1000.0000 mg | ORAL_TABLET | Freq: Once | ORAL | Status: AC
  Administered 2021-01-12: 1000 mg via ORAL
  Filled 2021-01-12: qty 2

## 2021-01-12 NOTE — ED Notes (Signed)
Pharmacy notified and aware of pt's Potassium in lactated ringers.

## 2021-01-12 NOTE — ED Notes (Signed)
Xray at the bedside.

## 2021-01-12 NOTE — ED Provider Notes (Signed)
[place] DEPT Provider Note   CSN: [id number] Arrival date & time: 01/11/21  2343     History Chief Complaint  Patient presents with   Flank Pain    KAMEN HOSTETLER is a 60 y.o. male with PMH alcohol abuse with daily drinking, HLD, HTN, previous nephrolithiasis who presents the emergency department for evaluation of right flank pain.  Patient states that he has had right flank pain for the past 3 days that radiates into the abdomen.  He states that he is having decreased urinary output and associated nausea but no vomiting chest pain, shortness of breath, abdominal pain, fever or other systemic symptoms.  Denies dysuria.    Flank Pain Pertinent negatives include no chest pain, no abdominal pain and no shortness of breath.      Past Medical History:  Diagnosis Date   Alcohol abuse    Anxiety    Arthritis    Diverticulosis of colon (without mention of hemorrhage)    GERD (gastroesophageal reflux disease)    History of kidney stones    Hyperlipemia    Hypertension    dr Ronalee Belts  norins   Kidney stones    Nephrolithiasis    Pancreatitis    Hx of    Ureteral stent displacement [facility]) 1990    Patient Active Problem List   Diagnosis Date Noted   Chest pain of uncertain etiology [id number]   Alcoholic pancreatitis 123456   Normal anion gap metabolic acidosis 123456   Radiculopathy 03/27/2018   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/02/2017   Renal stone 10/08/2016   Non-compliant behavior 12/02/2013   Routine health maintenance 01/27/2012   Hypertriglyceridemia 01/27/2012   Knee pain, left 11/12/2011   BPH (benign prostatic hyperplasia) 11/12/2011   GERD 08/10/2008   Essential hypertension 04/14/2008   Alcohol abuse 10/26/2007   TRIGGER FINGER, LEFT MIDDLE 05/12/2007   NEPHROLITHIASIS, HX OF 05/12/2007    Past Surgical History:  Procedure Laterality Date   A2 pulley flexor sheath cyst 4th finger excisional biopsy     '03    ANTERIOR CRUCIATE LIGAMENT REPAIR     R knee   ESOPHAGOGASTRODUODENOSCOPY N/A 07/09/2012   Procedure: ESOPHAGOGASTRODUODENOSCOPY (EGD);  Surgeon: Inda Castle, MD;  Location: [facility];  Service: Endoscopy;  Laterality: N/A;   KNEE SURGERY  2008   LEFT HEART CATH AND CORONARY ANGIOGRAPHY N/A 08/10/2019   Procedure: LEFT HEART CATH AND CORONARY ANGIOGRAPHY;  Surgeon: Lorretta Harp, MD;  Location: [place] [facility];  Service: Cardiovascular;  Laterality: N/A;   SHOULDER ARTHROSCOPY WITH SUBACROMIAL DECOMPRESSION AND OPEN ROTATOR C Right 12/26/2012   Procedure: RIGHT SHOULDER ARTHROSCOPY WITH SUBACROMIAL DECOMPRESSION MINI OPEN ROTATOR CUFF REPAIR, OPEN BICEP TENODESIS OPEN DISTAL CLAVICLE RESECTION  ;  Surgeon: Augustin Schooling, MD;  Location: [place];  Service: Orthopedics;  Laterality: Right;   TRANSFORAMINAL LUMBAR INTERBODY FUSION (TLIF) WITH PEDICLE SCREW FIXATION 1 LEVEL Right 03/27/2018   Procedure: RIGHT-SIDED LUMBAR 4-5 TRANSFORAMINAL LUMBAR INTERBODY FUSION WITH INSTRUMENTATION AND ALLOGRAFT;  Surgeon: Phylliss Bob, MD;  Location: [place];  Service: Orthopedics;  Laterality: Right;   URETEROSCOPY     and stone retreval       Family History  Problem Relation Age of Onset   Hypertension Father        X4 siblings   Heart disease Father    Diabetes Mother        x2 brothers    Social History   Tobacco Use   Smoking  status: Former    Types: Cigarettes    Quit date: 07/10/1998    Years since quitting: 22.5   Smokeless tobacco: Never  Vaping Use   Vaping Use: Never used  Substance Use Topics   Alcohol use: Yes    Alcohol/week: 6.0 standard drinks    Types: 6 Cans of beer per week    Comment: 1 6 pack a week   Drug use: No    Home Medications Prior to Admission medications   Medication Sig Start Date End Date Taking? Authorizing Provider  acetaminophen (TYLENOL) 500 MG tablet Take 2 tablets (1,000 mg total) by mouth every 6 (six) hours as needed. 04/04/20   Charlesetta Shanks, MD  amitriptyline (ELAVIL) 25 MG tablet Take 1 tablet (25 mg total) by mouth at bedtime. 09/20/20   Penumalli, Earlean Polka, MD  amLODipine (NORVASC) 10 MG tablet Take 1 tablet by mouth once daily 12/18/20   [name], NP  aspirin 81 MG EC tablet TAKE 1 TABLET (81 MG TOTAL) BY MOUTH DAILY. SWALLOW WHOLE. 06/30/20 06/30/21  [name], NP  atorvastatin (LIPITOR) 40 MG tablet Take 1 tablet by mouth once daily 09/19/20   Lorretta Harp, MD  carvedilol (COREG) 25 MG tablet Take 1 tablet (25 mg total) by mouth 2 (two) times daily with a meal. 01/04/21   [name], NP  cyclobenzaprine (FLEXERIL) 10 MG tablet Take 1 tablet (10 mg total) by mouth 3 (three) times daily as needed for muscle spasms. 01/04/21   [name], NP  FIBER PO Take 1 capsule by mouth daily.    [provider]  losartan (COZAAR) 100 MG tablet Take 1 tablet (100 mg total) by mouth daily. 10/10/20   [name], NP  Multiple Vitamins-Minerals (MULTIVITAMIN WITH MINERALS) tablet Take 1 tablet by mouth daily.    [provider]  omeprazole (PRILOSEC) 20 MG capsule Take 1 capsule by mouth once daily 12/07/20   [name], NP  Rimegepant Sulfate (NURTEC) 75 MG TBDP Take 75 mg by mouth daily as needed. 09/19/20   Penumalli, Earlean Polka, MD  rizatriptan (MAXALT-MLT) 10 MG disintegrating tablet Take 1 tablet (10 mg total) by mouth as needed for migraine. May repeat in 2 hours if needed 09/19/20   Penumalli, Earlean Polka, MD  Tetrahydrozoline HCl (VISINE OP) Place 1 drop into both eyes daily as needed (redness).    [provider]  traZODone (DESYREL) 100 MG tablet TAKE 1 TABLET (100 MG TOTAL) BY MOUTH AT BEDTIME. 01/04/21 01/04/22  [name], NP  triamcinolone cream (KENALOG) 0.1 % Apply 1 application topically 2 (two) times daily. 09/08/19   Scot Jun, FNP  diphenhydrAMINE (BENADRYL) 25 MG tablet Take 1 tablet (25 mg total) by mouth every 6 (six) hours as needed.  04/04/20 06/17/20  Charlesetta Shanks, MD  metoCLOPramide (REGLAN) 10 MG tablet Take 1 tablet (10 mg total) by mouth every 6 (six) hours as needed for nausea (nausea/headache). Take this tablet with 25 mg of Benadryl and two extra strength Tylenol for headache. 04/27/20 06/17/20  [name], NP    Allergies    Hydrocodone-acetaminophen and Shrimp [shellfish allergy]  Review of Systems   Review of Systems  Constitutional:  Negative for chills and fever.  HENT:  Negative for ear pain and sore throat.   Eyes:  Negative for pain and visual disturbance.  Respiratory:  Negative for cough and shortness of breath.   Cardiovascular:  Negative for chest  pain and palpitations.  Gastrointestinal:  Positive for nausea. Negative for abdominal pain and vomiting.  Genitourinary:  Positive for flank pain. Negative for dysuria and hematuria.  Musculoskeletal:  Negative for arthralgias and back pain.  Skin:  Negative for color change and rash.  Neurological:  Negative for seizures and syncope.  All other systems reviewed and are negative.  Physical Exam Updated Vital Signs BP (!) 153/93   Pulse 88   Temp 99.9 F (37.7 C) (Oral)   Resp 18   Ht 6' (1.829 m)   Wt 113.4 kg   SpO2 91%   BMI 33.91 kg/m   Physical Exam Vitals and nursing note reviewed.  Constitutional:      Appearance: He is well-developed.  HENT:     Head: Normocephalic and atraumatic.  Eyes:     Conjunctiva/sclera: Conjunctivae normal.  Cardiovascular:     Rate and Rhythm: Normal rate and regular rhythm.     Heart sounds: No murmur heard. Pulmonary:     Effort: Pulmonary effort is normal. No respiratory distress.     Breath sounds: Normal breath sounds.  Abdominal:     Palpations: Abdomen is soft.     Tenderness: There is no abdominal tenderness. There is right CVA tenderness.  Musculoskeletal:     Cervical back: Neck supple.  Skin:    General: Skin is warm and dry.  Neurological:     Mental Status: He is alert.     ED Results / Procedures / Treatments   Labs (all labs ordered are listed, but only abnormal results are displayed) Labs Reviewed  COMPREHENSIVE METABOLIC PANEL - Abnormal; Notable for the following components:      Result Value   Potassium 2.7 (*)    Glucose, Bld 110 (*)    Calcium 8.2 (*)    Total Bilirubin 1.4 (*)    All other components within normal limits  CBC WITH DIFFERENTIAL/PLATELET - Abnormal; Notable for the following components:   WBC 13.1 (*)    RBC 3.72 (*)    Hemoglobin 11.6 (*)    HCT 34.7 (*)    Neutro Abs 9.1 (*)    Monocytes Absolute 1.3 (*)    Abs Immature Granulocytes 0.10 (*)    All other components within normal limits  URINALYSIS, ROUTINE W REFLEX MICROSCOPIC    EKG None  Radiology CT Renal Stone Study  Result Date: 01/12/2021 CLINICAL DATA:  Right flank pain, hematuria EXAM: CT ABDOMEN AND PELVIS WITHOUT CONTRAST TECHNIQUE: Multidetector CT imaging of the abdomen and pelvis was performed following the standard protocol without IV contrast. COMPARISON:  05/19/2018 FINDINGS: Lower chest: No acute abnormality.  Small hiatal hernia. Hepatobiliary: No focal hepatic abnormality. Gallbladder unremarkable. Pancreas: No focal abnormality or ductal dilatation. Spleen: No focal abnormality.  Normal size. Adrenals/Urinary Tract: No adrenal abnormality. No focal renal abnormality. No stones or hydronephrosis. Urinary bladder is unremarkable. Stomach/Bowel: Sigmoid diverticulosis. No active diverticulitis. Diverticula also noted in the cecum. No bowel obstruction. Vascular/Lymphatic: Aortic atherosclerosis. No evidence of aneurysm or adenopathy. Reproductive: No visible focal abnormality. Other: No free fluid or free air. Musculoskeletal: No acute bony abnormality. IMPRESSION: No renal or ureteral stones.  No hydronephrosis. Cecal and sigmoid diverticulosis.  No active diverticulitis. Aortic atherosclerosis. Small hiatal hernia. Electronically Signed   By: Rolm Baptise  M.D.   On: 01/12/2021 00:26    Procedures Procedures   Medications Ordered in ED Medications - No data to display  ED Course  I have reviewed the triage vital signs  and the nursing notes.  Pertinent labs & imaging results that were available during my care of the patient were reviewed by me and considered in my medical decision making (see chart for details).    MDM Rules/Calculators/A&P                           Patient seen the emergency department for evaluation of right flank pain.  Reveals right CVA tenderness but is otherwise unremarkable.  Laboratory evaluation reveals a leukocytosis to 13.1, significantly low potassium at 2.7, unremarkable urinalysis.  Potassium repleted orally with oral magnesium and patient started on lactated Ringer's with KCl.  Repeat potassium 3.0.  CT stone study unremarkable with no nephrolithiasis, no evidence of pyelonephritis or hydronephrosis.  Chest x-ray obtained to rule out referred pain from a lower pneumonia on the right which was also reassuringly negative.  I had a long discussion with the patient on reevaluation about his alcohol use and told him about his low potassium which is likely secondary to receiving most of his calories from alcohol.  On reevaluation, there is a palpable knot on the paraspinal muscles in the lumbar spine on the right likely consistent where the patient is having pain.  Patient states that he will talk with his PCP about outpatient detox therapy and resources were provided to him on discharge.  Patient then discharged with return precautions which he voiced understanding. Final Clinical Impression(s) / ED Diagnoses Final diagnoses:  None    Rx / DC Orders ED Discharge Orders     None        Kristene Liberati, Debe Coder, MD 01/12/21 1209

## 2021-01-18 ENCOUNTER — Ambulatory Visit (INDEPENDENT_AMBULATORY_CARE_PROVIDER_SITE_OTHER): Payer: 59 | Admitting: Primary Care

## 2021-01-18 ENCOUNTER — Encounter (INDEPENDENT_AMBULATORY_CARE_PROVIDER_SITE_OTHER): Payer: Self-pay | Admitting: Primary Care

## 2021-01-18 ENCOUNTER — Other Ambulatory Visit: Payer: Self-pay

## 2021-01-18 VITALS — BP 129/84 | HR 87 | Temp 97.7°F | Ht 72.0 in | Wt 251.2 lb

## 2021-01-18 DIAGNOSIS — H938X3 Other specified disorders of ear, bilateral: Secondary | ICD-10-CM | POA: Diagnosis not present

## 2021-01-18 NOTE — Progress Notes (Signed)
[facility]    Subjective:    [name] is a 60 y.o. male of diminished hearing in both ears for the past 2 months. There is a prior history of cerumen impaction. The patient has not been using ear drops to loosen wax immediately prior to this visit. The patient denies ear pain.He went to ED on for right flank pain for the past 3 days that radiates into the abdomen.  He was having decreased urinary output. Incidental finding palpable knot on the paraspinal muscles in the lumbar spine on the right likely consistent where the patient is having pain. Treated with anti spasmatic medication which improves the pain.   The patient's history has been marked as reviewed and updated as appropriate. allergies, current medications, past family history, past medical history, past social history, and past surgical history Past Medical History:  Diagnosis Date   Alcohol abuse    Anxiety    Arthritis    Diverticulosis of colon (without mention of hemorrhage)    GERD (gastroesophageal reflux disease)    History of kidney stones    Hyperlipemia    Hypertension    dr Ronalee Belts  norins   Kidney stones    Nephrolithiasis    Pancreatitis    Hx of    Ureteral stent displacement [facility]) 1990   Patient Active Problem List   Diagnosis Date Noted   Chest pain of uncertain etiology [id number]   Alcoholic pancreatitis 123456   Normal anion gap metabolic acidosis 123456   Radiculopathy 03/27/2018   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/02/2017   Renal stone 10/08/2016   Non-compliant behavior 12/02/2013   Routine health maintenance 01/27/2012   Hypertriglyceridemia 01/27/2012   Knee pain, left 11/12/2011   BPH (benign prostatic hyperplasia) 11/12/2011   GERD 08/10/2008   Essential hypertension 04/14/2008   Alcohol abuse 10/26/2007   TRIGGER FINGER, LEFT MIDDLE 05/12/2007   NEPHROLITHIASIS, HX OF 05/12/2007   Past Surgical History:  Procedure Laterality Date   A2  pulley flexor sheath cyst 4th finger excisional biopsy     '03   ANTERIOR CRUCIATE LIGAMENT REPAIR     R knee   ESOPHAGOGASTRODUODENOSCOPY N/A 07/09/2012   Procedure: ESOPHAGOGASTRODUODENOSCOPY (EGD);  Surgeon: Inda Castle, MD;  Location: [facility];  Service: Endoscopy;  Laterality: N/A;   KNEE SURGERY  2008   LEFT HEART CATH AND CORONARY ANGIOGRAPHY N/A 08/10/2019   Procedure: LEFT HEART CATH AND CORONARY ANGIOGRAPHY;  Surgeon: Lorretta Harp, MD;  Location: [place] [facility];  Service: Cardiovascular;  Laterality: N/A;   SHOULDER ARTHROSCOPY WITH SUBACROMIAL DECOMPRESSION AND OPEN ROTATOR C Right 12/26/2012   Procedure: RIGHT SHOULDER ARTHROSCOPY WITH SUBACROMIAL DECOMPRESSION MINI OPEN ROTATOR CUFF REPAIR, OPEN BICEP TENODESIS OPEN DISTAL CLAVICLE RESECTION  ;  Surgeon: Augustin Schooling, MD;  Location: [place];  Service: Orthopedics;  Laterality: Right;   TRANSFORAMINAL LUMBAR INTERBODY FUSION (TLIF) WITH PEDICLE SCREW FIXATION 1 LEVEL Right 03/27/2018   Procedure: RIGHT-SIDED LUMBAR 4-5 TRANSFORAMINAL LUMBAR INTERBODY FUSION WITH INSTRUMENTATION AND ALLOGRAFT;  Surgeon: Phylliss Bob, MD;  Location: [place];  Service: Orthopedics;  Laterality: Right;   URETEROSCOPY     and stone retreval   Family History  Problem Relation Age of Onset   Hypertension Father        X4 siblings   Heart disease Father    Diabetes Mother        x2 brothers   Social History  Socioeconomic History   Marital status: Legally Separated    Spouse name: Not on file   Number of children: 3   Years of education: Not on file   Highest education level: Not on file  Occupational History    Employer: [employer]    Comment: Estate agent , retired  Tobacco Use   Smoking status: Former    Types: Cigarettes    Quit date: 07/10/1998    Years since quitting: 22.5   Smokeless tobacco: Never  Vaping Use   Vaping Use: Never used  Substance and Sexual Activity   Alcohol use: Yes    Alcohol/week: 6.0  standard drinks    Types: 6 Cans of beer per week    Comment: 1 6 pack a week   Drug use: No   Sexual activity: Yes  Other Topics Concern   Not on file  Social History Narrative   HS[employer]   [employer] groundskeeper   Difficult domestic situation, Married '87- separated '01   2 sons- '82, '91; 1 daughter '91   His niece is 31 with metastatic breast cancer (feb '12)   Social Determinants of Health   Financial Resource Strain: Not on file  Food Insecurity: Not on file  Transportation Needs: Not on file  Physical Activity: Not on file  Stress: Not on file  Social Connections: Not on file   Allergies: Hydrocodone-acetaminophen and Shrimp [shellfish allergy]  Review of Systems Pertinent items noted in HPI and remainder of comprehensive ROS otherwise negative.    Objective:    Auditory canal(s) of both ears are completely obstructed with cerumen.   Cerumen was removed using gentle irrigation. Tympanic membranes are intact following the procedure.  Auditory canals are normal.    Assessment:  [name] was seen today for ear irrigation.  Diagnoses and all orders for this visit:  Irritation of both ears    Cerumen Impaction without otitis externa.    Plan:    1. Care instructions given. 2. Home treatment: none. 3. Follow-up as needed.   [name]  This note has been created with Surveyor, quantity. Any transcriptional errors are unintentional.

## 2021-01-20 ENCOUNTER — Other Ambulatory Visit (INDEPENDENT_AMBULATORY_CARE_PROVIDER_SITE_OTHER): Payer: Self-pay | Admitting: Primary Care

## 2021-01-20 DIAGNOSIS — Z760 Encounter for issue of repeat prescription: Secondary | ICD-10-CM

## 2021-01-20 NOTE — Telephone Encounter (Signed)
Sent to PCP ?

## 2021-02-28 ENCOUNTER — Encounter: Payer: Self-pay | Admitting: Cardiovascular Disease

## 2021-02-28 ENCOUNTER — Encounter (INDEPENDENT_AMBULATORY_CARE_PROVIDER_SITE_OTHER): Payer: Self-pay

## 2021-02-28 ENCOUNTER — Other Ambulatory Visit: Payer: Self-pay

## 2021-02-28 ENCOUNTER — Ambulatory Visit (INDEPENDENT_AMBULATORY_CARE_PROVIDER_SITE_OTHER): Payer: Self-pay | Admitting: Cardiovascular Disease

## 2021-02-28 VITALS — BP 134/86 | HR 82 | Ht 72.0 in | Wt 255.2 lb

## 2021-02-28 DIAGNOSIS — E669 Obesity, unspecified: Secondary | ICD-10-CM

## 2021-02-28 DIAGNOSIS — E781 Pure hyperglyceridemia: Secondary | ICD-10-CM

## 2021-02-28 DIAGNOSIS — R079 Chest pain, unspecified: Secondary | ICD-10-CM

## 2021-02-28 DIAGNOSIS — I2510 Atherosclerotic heart disease of native coronary artery without angina pectoris: Secondary | ICD-10-CM

## 2021-02-28 DIAGNOSIS — I10 Essential (primary) hypertension: Secondary | ICD-10-CM

## 2021-02-28 LAB — LIPID PANEL
Chol/HDL Ratio: 2 ratio (ref 0.0–5.0)
Cholesterol, Total: 137 mg/dL (ref 100–199)
HDL: 67 mg/dL (ref >39)
LDL Chol Calc (NIH): 47 mg/dL (ref 0–99)
Triglycerides: 134 mg/dL (ref 0–149)
VLDL Cholesterol Cal: 23 mg/dL (ref 5–40)

## 2021-02-28 LAB — HEPATIC FUNCTION PANEL
ALT: 27 IU/L (ref 0–44)
AST: 25 IU/L (ref 0–40)
Albumin: 4.6 g/dL (ref 3.8–4.9)
Alkaline Phosphatase: 43 IU/L — ABNORMAL LOW (ref 44–121)
Bilirubin Total: 0.7 mg/dL (ref 0.0–1.2)
Bilirubin, Direct: 0.24 mg/dL (ref 0.00–0.40)
Total Protein: 7.1 g/dL (ref 6.0–8.5)

## 2021-02-28 NOTE — Patient Instructions (Signed)
Medication Instructions:  °Your physician recommends that you continue on your current medications as directed. Please refer to the Current Medication list given to you today. ° °*If you need a refill on your cardiac medications before your next appointment, please call your pharmacy* ° ° °Lab Work: °Your physician recommends that you have labs drawn today: Lipid/liver profile ° °If you have labs (blood work) drawn today and your tests are completely normal, you will receive your results only by: °MyChart Message (if you have MyChart) OR °A paper copy in the mail °If you have any lab test that is abnormal or we need to change your treatment, we will call you to review the results. ° ° ° °Follow-Up: °At [facility], you and your health needs are our priority.  As part of our continuing mission to provide you with exceptional heart care, we have created designated Provider Care Teams.  These Care Teams include your primary Cardiologist (physician) and Advanced Practice Providers (APPs -  Physician Assistants and Nurse Practitioners) who all work together to provide you with the care you need, when you need it. ° °We recommend signing up for the patient portal called "MyChart".  Sign up information is provided on this After Visit Summary.  MyChart is used to connect with patients for Virtual Visits (Telemedicine).  Patients are able to view lab/test results, encounter notes, upcoming appointments, etc.  Non-urgent messages can be sent to your provider as well.   °To learn more about what you can do with MyChart, go to [email or web address].   ° °Your next appointment:   °12 month(s) ° °The format for your next appointment:   °In Person ° °Provider:   °Jonathan Berry, MD °

## 2021-02-28 NOTE — Assessment & Plan Note (Addendum)
History of hypertriglyceridemia in the past most likely related to alcohol intake.  He is on atorvastatin 40 mg a day.  We will recheck a lipid liver profile this morning.

## 2021-02-28 NOTE — Progress Notes (Signed)
02/28/2021 [name]   28-Apr-1961  [id number]  Primary Physician [name], NP Primary Cardiologist: Lorretta Harp MD Lupe Carney, [place]  HPI:  [name] is a 60 y.o.  moderately overweight separated African-American male father of 30, grandfather of 3 grandchildren who is currently disabled from being a Manufacturing engineer at [employer] .    I last saw him in the office 09/16/2019.  He had back issues and back surgery.  He is referred by Juluis Mire, NP for evaluation of chest pain.  His risk factors include remote tobacco abuse, treated hypertension and hyperlipidemia as well as family history with a brother who had stents.  He is never had a heart tach or stroke.  He does not admit to drinking excessive alcohol.  He was recently started on pravastatin by his PCP.  He has new onset chest pain over the last several weeks occurring several times a week with some characteristics of GERD and some atypical characteristics as well.  These are also associated with palpitations.   I performed a coronary CTA on him revealing proximal LAD lesion on 07/30/2019 that is led to outpatient diagnostic coronary angiogram via the right radial approach 08/10/2019 that revealed noncritical CAD with most 40% proximal LAD lesion thought not to be physiologically significant.    Since I saw him a year and a half ago he continues to do well.  He says he used to play golf but no longer does.  He does walk without symptoms.  He lives at home with his mother who he takes care of.  He says that his alcohol intake is decreased.  He denies chest pain or shortness of breath.  Current Meds  Medication Sig   acetaminophen (TYLENOL) 500 MG tablet Take 2 tablets (1,000 mg total) by mouth every 6 (six) hours as needed.   amLODipine (NORVASC) 10 MG tablet Take 1 tablet by mouth once daily   aspirin 81 MG EC tablet TAKE 1 TABLET (81 MG TOTAL) BY MOUTH DAILY. SWALLOW WHOLE.   atorvastatin (LIPITOR) 40 MG  tablet Take 1 tablet by mouth once daily   carvedilol (COREG) 25 MG tablet Take 1 tablet (25 mg total) by mouth 2 (two) times daily with a meal.   cyclobenzaprine (FLEXERIL) 10 MG tablet Take 1 tablet (10 mg total) by mouth 3 (three) times daily as needed for muscle spasms.   FIBER PO Take 1 capsule by mouth daily.   losartan (COZAAR) 100 MG tablet Take 1 tablet (100 mg total) by mouth daily.   Multiple Vitamins-Minerals (MULTIVITAMIN WITH MINERALS) tablet Take 1 tablet by mouth daily.   omeprazole (PRILOSEC) 20 MG capsule Take 1 capsule by mouth once daily   Rimegepant Sulfate (NURTEC) 75 MG TBDP Take 75 mg by mouth daily as needed.   rizatriptan (MAXALT-MLT) 10 MG disintegrating tablet Take 1 tablet (10 mg total) by mouth as needed for migraine. May repeat in 2 hours if needed   Tetrahydrozoline HCl (VISINE OP) Place 1 drop into both eyes daily as needed (redness).   triamcinolone cream (KENALOG) 0.1 % Apply 1 application topically 2 (two) times daily.     Allergies  Allergen Reactions   Hydrocodone-Acetaminophen Palpitations   Shrimp [Shellfish Allergy] Other (See Comments)    Cholesterol level becomes very high. Patient reports sweating and itching.    Social History   Socioeconomic History   Marital status: Legally Separated    Spouse name: Not on  file   Number of children: 3   Years of education: Not on file   Highest education level: Not on file  Occupational History    Employer: [employer]    Comment: [employer] , retired  Tobacco Use   Smoking status: Former    Types: Cigarettes    Quit date: 07/10/1998    Years since quitting: 22.6   Smokeless tobacco: Never  Vaping Use   Vaping Use: Never used  Substance and Sexual Activity   Alcohol use: Yes    Alcohol/week: 6.0 standard drinks    Types: 6 Cans of beer per week    Comment: 1 6 pack a week   Drug use: No   Sexual activity: Yes  Other Topics Concern   Not on file  Social History Narrative   HSG    [employer] groundskeeper   Difficult domestic situation, Married '87- separated '01   2 sons- '82, '91; 1 daughter '91   His niece is 75 with metastatic breast cancer (feb '12)   Social Determinants of Health   Financial Resource Strain: Not on file  Food Insecurity: Not on file  Transportation Needs: Not on file  Physical Activity: Not on file  Stress: Not on file  Social Connections: Not on file  Intimate Partner Violence: Not on file     Review of Systems: General: negative for chills, fever, night sweats or weight changes.  Cardiovascular: negative for chest pain, dyspnea on exertion, edema, orthopnea, palpitations, paroxysmal nocturnal dyspnea or shortness of breath Dermatological: negative for rash Respiratory: negative for cough or wheezing Urologic: negative for hematuria Abdominal: negative for nausea, vomiting, diarrhea, bright red blood per rectum, melena, or hematemesis Neurologic: negative for visual changes, syncope, or dizziness All other systems reviewed and are otherwise negative except as noted above.    Blood pressure 134/86, pulse 82, height 6' (1.829 m), weight 255 lb 3.2 oz (115.8 kg), SpO2 98 %.  General appearance: alert and no distress Neck: no adenopathy, no carotid bruit, no JVD, supple, symmetrical, trachea midline, and thyroid not enlarged, symmetric, no tenderness/mass/nodules Lungs: clear to auscultation bilaterally Heart: regular rate and rhythm, S1, S2 normal, no murmur, click, rub or gallop Extremities: extremities normal, atraumatic, no cyanosis or edema Pulses: 2+ and symmetric Skin: Skin color, texture, turgor normal. No rashes or lesions Neurologic: Grossly normal  EKG sinus rhythm 82 with nonspecific ST and T wave changes.  I personally reviewed this EKG.  ASSESSMENT AND PLAN:   Alcohol abuse Patient says that he is drinking alcohol less than he has in the past  Essential hypertension History of essential hypertension a blood pressure  measured today at 134/86.  He is on amlodipine and losartan.  Hyperlipidemia History of hypertriglyceridemia in the past most likely related to alcohol intake.  He is on atorvastatin 40 mg a day.  We will recheck a lipid liver profile this morning.  Chest pain of uncertain etiology History of atypical chest pain having undergone coronary CTA 07/30/2019 suggesting a significant proximal LAD lesion.  I performed outpatient radial diagnostic cath on him 07/30/2019 revealing at most 40% segmental proximal LAD suggesting this was not physiologically significant.  Since that time he said no recurrent chest pain.     Lorretta Harp MD FACP,FACC,FAHA, [facility] 02/28/2021 9:12 AM

## 2021-02-28 NOTE — Assessment & Plan Note (Signed)
History of essential hypertension a blood pressure measured today at 134/86.  He is on amlodipine and losartan.

## 2021-02-28 NOTE — Assessment & Plan Note (Signed)
History of atypical chest pain having undergone coronary CTA 07/30/2019 suggesting a significant proximal LAD lesion.  I performed outpatient radial diagnostic cath on him 07/30/2019 revealing at most 40% segmental proximal LAD suggesting this was not physiologically significant.  Since that time he said no recurrent chest pain.

## 2021-02-28 NOTE — Assessment & Plan Note (Signed)
Patient says that he is drinking alcohol less than he has in the past

## 2021-03-09 ENCOUNTER — Telehealth: Payer: Self-pay | Admitting: *Deleted

## 2021-03-09 NOTE — Telephone Encounter (Addendum)
Per CMM, PA can't be completed. Called pharmacy help desk, spoke with Danae Chen who stated patient has 2 profiles. She is unable to assist. Called Rx help desk # on back of card, spoke with Thurmond Butts who gave new insurance information: ID [id number] BIN [id number] PCN [id number] [place] Started new PA on CMM,  Key: [id number], faxed to plan.

## 2021-03-09 NOTE — Telephone Encounter (Signed)
Nurtec PA, key [id number], [id number], failed sumatriptan. Your PA has been faxed to the plan as a paper copy. Please contact the plan directly if you haven't received a determination in a typical timeframe.  You will be notified of the determination electronically and via fax.

## 2021-03-13 NOTE — Telephone Encounter (Signed)
From Med impact: patient does not have active eligibility. [place], spoke with pharmacy tech who stated insurance is [facility]  [id number], PCN [id number],   id [id number], Group  [id number].  Will resubmit Nurtec PA. Key for new PA: [id number]. Received message: [employer] Rx Prior Authorization Department is unable to review this request at this time. Our records indicate that this is not one of [employer]'s members. [employer],  spoke with [place] who started PA. Tried, failed sumatriptan. Case ref #, [id number], fax # to submit notes [phone]. Turn around 4-15 days. Faxed office notes. Received confirmation.

## 2021-03-15 ENCOUNTER — Encounter: Payer: Self-pay | Admitting: *Deleted

## 2021-03-15 NOTE — Telephone Encounter (Signed)
[employer] approved Nurtec 03/13/21 to 03/13/2022. Faxed approval letter to pharmacy. Sent him my chart.

## 2021-03-21 NOTE — Progress Notes (Deleted)
No chief complaint on file.    HISTORY OF PRESENT ILLNESS:  03/21/21 ALL:  [name] is a 60 y.o. male here today for follow up for headaches. He was started on amitriptyline 25mg  daily at consult with Dr Leta Baptist 09/2020. Rizatritpan and Nurtec for abortive therapy.    HISTORY (copied from  Dr Gladstone Lighter previous note)  60 year old male here for evaluation of headaches.  Patient has had headaches for the past 6 to 8 months with occipital and left parietal throbbing severe pain lasting hours at a time.  Sometimes associated with nausea.  No sensitivity to light or sound.  Sometimes he feels some blurred vision and spots with headaches.  No prior similar headaches.  Patient went to the emergency room in November 2021 for evaluation and CT and CTA of head and neck which were unremarkable.  He was tried on sumatriptan without relief.   Also has history of anxiety and insomnia, alcohol abuse, alcoholic pancreatitis.  He has been drinking about 6 beers per week but lately has been drinking up to 12 beers or more per week.   REVIEW OF SYSTEMS: Out of a complete 14 system review of symptoms, the patient complains only of the following symptoms, and all other reviewed systems are negative.   ALLERGIES: Allergies  Allergen Reactions   Hydrocodone-Acetaminophen Palpitations   Shrimp [Shellfish Allergy] Other (See Comments)    Cholesterol level becomes very high. Patient reports sweating and itching.     HOME MEDICATIONS: Outpatient Medications Prior to Visit  Medication Sig Dispense Refill   acetaminophen (TYLENOL) 500 MG tablet Take 2 tablets (1,000 mg total) by mouth every 6 (six) hours as needed. 30 tablet 0   amitriptyline (ELAVIL) 25 MG tablet Take 1 tablet (25 mg total) by mouth at bedtime. (Patient not taking: Reported on 02/28/2021) 30 tablet 2   amLODipine (NORVASC) 10 MG tablet Take 1 tablet by mouth once daily 90 tablet 1   aspirin 81 MG EC tablet TAKE 1 TABLET (81 MG  TOTAL) BY MOUTH DAILY. SWALLOW WHOLE. 90 tablet 3   atorvastatin (LIPITOR) 40 MG tablet Take 1 tablet by mouth once daily 90 tablet 2   carvedilol (COREG) 25 MG tablet Take 1 tablet (25 mg total) by mouth 2 (two) times daily with a meal. 180 tablet 0   cyclobenzaprine (FLEXERIL) 10 MG tablet Take 1 tablet (10 mg total) by mouth 3 (three) times daily as needed for muscle spasms. 30 tablet 0   FIBER PO Take 1 capsule by mouth daily.     losartan (COZAAR) 100 MG tablet Take 1 tablet (100 mg total) by mouth daily. 90 tablet 1   Multiple Vitamins-Minerals (MULTIVITAMIN WITH MINERALS) tablet Take 1 tablet by mouth daily.     omeprazole (PRILOSEC) 20 MG capsule Take 1 capsule by mouth once daily 30 capsule 0   potassium chloride SA (KLOR-CON) 20 MEQ tablet Take 2 tablets (40 mEq total) by mouth 2 (two) times daily for 1 day. 4 tablet 0   Rimegepant Sulfate (NURTEC) 75 MG TBDP Take 75 mg by mouth daily as needed. 8 tablet 6   rizatriptan (MAXALT-MLT) 10 MG disintegrating tablet Take 1 tablet (10 mg total) by mouth as needed for migraine. May repeat in 2 hours if needed 9 tablet 11   Tetrahydrozoline HCl (VISINE OP) Place 1 drop into both eyes daily as needed (redness).     traZODone (DESYREL) 100 MG tablet TAKE 1 TABLET (100 MG TOTAL) BY MOUTH AT  BEDTIME. (Patient not taking: Reported on 02/28/2021) 90 tablet 1   triamcinolone cream (KENALOG) 0.1 % Apply 1 application topically 2 (two) times daily. 60 g 0   No facility-administered medications prior to visit.     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     PAST SURGICAL HISTORY: Past Surgical  History:  Procedure Laterality Date   A2 pulley flexor sheath cyst 4th finger excisional biopsy     '03   ANTERIOR CRUCIATE LIGAMENT REPAIR     R knee   ESOPHAGOGASTRODUODENOSCOPY N/A 07/09/2012   Procedure: ESOPHAGOGASTRODUODENOSCOPY (EGD);  Surgeon: Inda Castle, MD;  Location: [facility];  Service: Endoscopy;  Laterality: N/A;   KNEE SURGERY  2008   LEFT HEART CATH AND CORONARY ANGIOGRAPHY N/A 08/10/2019   Procedure: LEFT HEART CATH AND CORONARY ANGIOGRAPHY;  Surgeon: Lorretta Harp, MD;  Location: [facility];  Service: Cardiovascular;  Laterality: N/A;   SHOULDER ARTHROSCOPY WITH SUBACROMIAL DECOMPRESSION AND OPEN ROTATOR C Right 12/26/2012   Procedure: RIGHT SHOULDER ARTHROSCOPY WITH SUBACROMIAL DECOMPRESSION MINI OPEN ROTATOR CUFF REPAIR, OPEN BICEP TENODESIS OPEN DISTAL CLAVICLE RESECTION  ;  Surgeon: Augustin Schooling, MD;  Location: [facility];  Service: Orthopedics;  Laterality: Right;   TRANSFORAMINAL LUMBAR INTERBODY FUSION (TLIF) WITH PEDICLE SCREW FIXATION 1 LEVEL Right 03/27/2018   Procedure: RIGHT-SIDED LUMBAR 4-5 TRANSFORAMINAL LUMBAR INTERBODY FUSION WITH INSTRUMENTATION AND ALLOGRAFT;  Surgeon: Phylliss Bob, MD;  Location: [facility];  Service: Orthopedics;  Laterality: Right;   URETEROSCOPY     and stone retreval     FAMILY HISTORY: Family History  Problem Relation Age of Onset   Hypertension Father        X4 siblings   Heart disease Father    Diabetes Mother        x2 brothers     SOCIAL HISTORY: Social History   Socioeconomic History   Marital status: Legally Separated    Spouse name: Not on file   Number of children: 3   Years of education: Not on file   Highest education level: Not on file  Occupational History    Employer: [employer]    Comment: Estate agent , retired  Tobacco Use   Smoking status: Former    Types: Cigarettes    Quit date: 07/10/1998    Years since quitting: 22.7   Smokeless tobacco: Never  Vaping Use   Vaping Use:  Never used  Substance and Sexual Activity   Alcohol use: Yes    Alcohol/week: 6.0 standard drinks    Types: 6 Cans of beer per week    Comment: 1 6 pack a week   Drug use: No   Sexual activity: Yes  Other Topics Concern   Not on file  Social History Narrative   HSG   [employer] groundskeeper   Difficult domestic situation, Married '87- separated '01   2 sons- '82, '91; 1 daughter '91   His niece is 42 with metastatic breast cancer (feb '12)   Social Determinants of Health  Financial Resource Strain: Not on file  Food Insecurity: Not on file  Transportation Needs: Not on file  Physical Activity: Not on file  Stress: Not on file  Social Connections: Not on file  Intimate Partner Violence: Not on file     PHYSICAL EXAM  There were no vitals filed for this visit. There is no height or weight on file to calculate BMI.  Generalized: Well developed, in no acute distress  Cardiology: normal rate and rhythm, no murmur auscultated  Respiratory: clear to auscultation bilaterally    Neurological examination  Mentation: Alert oriented to time, place, history taking. Follows all commands speech and language fluent Cranial nerve II-XII: Pupils were equal round reactive to light. Extraocular movements were full, visual field were full on confrontational test. Facial sensation and strength were normal. Uvula tongue midline. Head turning and shoulder shrug  were normal and symmetric. Motor: The motor testing reveals 5 over 5 strength of all 4 extremities. Good symmetric motor tone is noted throughout.  Sensory: Sensory testing is intact to soft touch on all 4 extremities. No evidence of extinction is noted.  Coordination: Cerebellar testing reveals good finger-nose-finger and heel-to-shin bilaterally.  Gait and station: Gait is normal. Tandem gait is normal. Romberg is negative. No drift is seen.  Reflexes: Deep tendon reflexes are symmetric and normal bilaterally.    DIAGNOSTIC DATA (LABS,  IMAGING, TESTING) - I reviewed patient records, labs, notes, testing and imaging myself where available.  Lab Results  Component Value Date   WBC 13.1 (H) 01/12/2021   HGB 11.6 (L) 01/12/2021   HCT 34.7 (L) 01/12/2021   MCV 93.3 01/12/2021   PLT 229 01/12/2021      Component Value Date/Time   NA 144 01/12/2021 0007   NA 138 06/17/2020 2020   K 3.0 (L) 01/12/2021 1023   CL 109 01/12/2021 0007   CO2 24 01/12/2021 0007   GLUCOSE 110 (H) 01/12/2021 0007   BUN 11 01/12/2021 0007   BUN 11 06/17/2020 2020   CREATININE 0.79 01/12/2021 0007   CALCIUM 8.2 (L) 01/12/2021 0007   PROT 7.1 02/28/2021 0928   ALBUMIN 4.6 02/28/2021 0928   AST 25 02/28/2021 0928   ALT 27 02/28/2021 0928   ALKPHOS 43 (L) 02/28/2021 0928   BILITOT 0.7 02/28/2021 0928   GFRNONAA >60 01/12/2021 0007   GFRAA 67 06/17/2020 2020   Lab Results  Component Value Date   CHOL 137 02/28/2021   HDL 67 02/28/2021   LDLCALC 47 02/28/2021   LDLDIRECT 107.5 01/24/2012   TRIG 134 02/28/2021   CHOLHDL 2.0 02/28/2021   Lab Results  Component Value Date   HGBA1C 5.3 09/08/2019   No results found for: [id number] Lab Results  Component Value Date   TSH 1.173 04/03/2020    No flowsheet data found.   No flowsheet data found.   ASSESSMENT AND PLAN  60 y.o. year old male  has a past medical history of Alcohol abuse, Anxiety, Arthritis, Diverticulosis of colon (without mention of hemorrhage), GERD (gastroesophageal reflux disease), History of kidney stones, Hyperlipemia, Hypertension, Kidney stones, Nephrolithiasis, Pancreatitis, and Ureteral stent displacement ([place]) (1990). here with    No diagnosis found.   No orders of the defined types were placed in this encounter.    No orders of the defined types were placed in this encounter.     Debbora Presto, MSN, FNP-C 03/21/2021, 3:02 PM  [facility] [address], [place], [place] [address] [phone]

## 2021-03-21 NOTE — Patient Instructions (Incomplete)

## 2021-03-22 ENCOUNTER — Ambulatory Visit: Payer: 59 | Admitting: Family Medicine

## 2021-04-05 ENCOUNTER — Encounter (INDEPENDENT_AMBULATORY_CARE_PROVIDER_SITE_OTHER): Payer: Self-pay | Admitting: Primary Care

## 2021-04-05 ENCOUNTER — Ambulatory Visit (INDEPENDENT_AMBULATORY_CARE_PROVIDER_SITE_OTHER): Payer: 59 | Admitting: Primary Care

## 2021-04-05 ENCOUNTER — Other Ambulatory Visit: Payer: Self-pay

## 2021-04-05 VITALS — BP 128/86 | HR 78 | Temp 97.3°F | Ht 69.0 in | Wt 253.6 lb

## 2021-04-05 DIAGNOSIS — G8929 Other chronic pain: Secondary | ICD-10-CM

## 2021-04-05 DIAGNOSIS — I10 Essential (primary) hypertension: Secondary | ICD-10-CM

## 2021-04-05 DIAGNOSIS — M545 Low back pain, unspecified: Secondary | ICD-10-CM

## 2021-04-05 DIAGNOSIS — K029 Dental caries, unspecified: Secondary | ICD-10-CM | POA: Diagnosis not present

## 2021-04-05 MED ORDER — CHLORHEXIDINE GLUCONATE 0.12 % MT SOLN: 15.0000 mL | Freq: Two times a day (BID) | OROMUCOSAL | 0 refills | Status: DC

## 2021-04-09 NOTE — Progress Notes (Signed)
HPI                                                                                                                  [facility] [name] is a 60 y.o. obese male who presents for chronic back pain and needing a referral to a dentist for tooth pain sensitive to temperature change. Patient complains of back pain 9 out of 10 aggravating factors are walking periods of time, standing, going up steps and lifting.  Alleviating factors none requesting another referral to a different pain management that will accept his insurance. Past Medical History:  Diagnosis Date   Alcohol abuse    Anxiety    Arthritis    Diverticulosis of colon (without mention of hemorrhage)    GERD (gastroesophageal reflux disease)    History of kidney stones    Hyperlipemia    Hypertension    dr Ronalee Belts  norins   Kidney stones    Nephrolithiasis    Pancreatitis    Hx of    Ureteral stent displacement ([facility]) 1990     Allergies  Allergen Reactions   Hydrocodone-Acetaminophen Palpitations   Shrimp [Shellfish Allergy] Other (See Comments)    Cholesterol level becomes very high. Patient reports sweating and itching.      Current Outpatient Medications on File Prior to Visit  Medication Sig Dispense Refill   acetaminophen (TYLENOL) 500 MG tablet Take 2 tablets (1,000 mg total) by mouth every 6 (six) hours as needed. 30 tablet 0   amitriptyline (ELAVIL) 25 MG tablet Take 1 tablet (25 mg total) by mouth at bedtime. (Patient not taking: Reported on 02/28/2021) 30 tablet 2   amLODipine (NORVASC) 10 MG tablet Take 1 tablet by mouth once daily 90 tablet 1   aspirin 81 MG EC tablet TAKE 1 TABLET (81 MG TOTAL) BY MOUTH DAILY. SWALLOW WHOLE. 90 tablet 3   atorvastatin (LIPITOR) 40 MG tablet Take 1 tablet by mouth once daily 90 tablet 2   carvedilol (COREG) 25 MG tablet Take 1 tablet (25 mg total) by mouth 2 (two) times daily with a meal. 180 tablet 0   cyclobenzaprine (FLEXERIL) 10 MG tablet Take 1  tablet (10 mg total) by mouth 3 (three) times daily as needed for muscle spasms. 30 tablet 0   FIBER PO Take 1 capsule by mouth daily.     losartan (COZAAR) 100 MG tablet Take 1 tablet (100 mg total) by mouth daily. 90 tablet 1   Multiple Vitamins-Minerals (MULTIVITAMIN WITH MINERALS) tablet Take 1 tablet by mouth daily.     omeprazole (PRILOSEC) 20 MG capsule Take 1 capsule by mouth once daily 30 capsule 0   potassium chloride SA (KLOR-CON) 20 MEQ tablet Take 2 tablets (40 mEq total) by mouth 2 (two) times daily for 1 day. 4 tablet 0   Rimegepant Sulfate (NURTEC) 75 MG TBDP Take 75 mg by mouth daily as needed. 8 tablet 6   rizatriptan (MAXALT-MLT) 10 MG disintegrating tablet Take 1 tablet (  10 mg total) by mouth as needed for migraine. May repeat in 2 hours if needed 9 tablet 11   Tetrahydrozoline HCl (VISINE OP) Place 1 drop into both eyes daily as needed (redness).     traZODone (DESYREL) 100 MG tablet TAKE 1 TABLET (100 MG TOTAL) BY MOUTH AT BEDTIME. (Patient not taking: Reported on 02/28/2021) 90 tablet 1   triamcinolone cream (KENALOG) 0.1 % Apply 1 application topically 2 (two) times daily. 60 g 0   [DISCONTINUED] diphenhydrAMINE (BENADRYL) 25 MG tablet Take 1 tablet (25 mg total) by mouth every 6 (six) hours as needed. 30 tablet 0   [DISCONTINUED] metoCLOPramide (REGLAN) 10 MG tablet Take 1 tablet (10 mg total) by mouth every 6 (six) hours as needed for nausea (nausea/headache). Take this tablet with 25 mg of Benadryl and two extra strength Tylenol for headache. 12 tablet 0   No current facility-administered medications on file prior to visit.    ROS: all negative except above.   Physical Exam: Filed Weights   04/05/21 1123  Weight: 253 lb 9.6 oz (115 kg)   BP 128/86 (BP Location: Right Arm, Patient Position: Sitting, Cuff Size: Large)   Pulse 78   Temp (!) 97.3 F (36.3 C) (Temporal)   Ht 5\' 9"  (1.753 m)   Wt 253 lb 9.6 oz (115 kg)   SpO2 97%   BMI 37.45 kg/m  General  Appearance: Well nourished, in no apparent distress. Eyes: PERRLA, EOMs, conjunctiva no swelling or erythema Sinuses: No Frontal/maxillary tenderness ENT/Mouth: Ext aud canals clear, TMs without erythema, bulging.  Hearing normal.  Neck: Supple, thyroid normal.  Respiratory: Respiratory effort normal, BS equal bilaterally without rales, rhonchi, wheezing or stridor.  Cardio: RRR with no MRGs. Brisk peripheral pulses without edema.  Abdomen: Soft, + BS.  Non tender, no guarding, rebound, hernias, masses. Lymphatics: Non tender without lymphadenopathy.  Musculoskeletal: Full ROM, 5/5 strength, normal gait.  Skin: Warm, dry without rashes, lesions, ecchymosis.  Neuro: Cranial nerves intact. Normal muscle tone, no cerebellar symptoms. Sensation intact.  Psych: Awake and oriented X 3, normal affect, Insight and Judgment appropriate.    [name] was seen today for hypertension.  Diagnoses and all orders for this visit:  Chronic bilateral low back pain, unspecified whether sciatica present -     Ambulatory referral to Pain Clinic  Dental caries -     Ambulatory referral to Dentistry -     chlorhexidine (PERIDEX) 0.12 % solution; Use as directed 15 mLs in the mouth or throat 2 (two) times daily.   Essential hypertension Blood pressure is well controlled today's reading is 128/86 on triple therapy amlodipine 10 mg daily, losartan 100 mg daily and Coreg 25 mg twice daily. He will continue to follow DASH diet, medication compliance, 150 minutes of moderate intensity exercise per week. Discussed medication compliance, adverse effects.   [name], NP 7:08 PM

## 2021-04-18 ENCOUNTER — Other Ambulatory Visit (INDEPENDENT_AMBULATORY_CARE_PROVIDER_SITE_OTHER): Payer: Self-pay | Admitting: Primary Care

## 2021-04-18 DIAGNOSIS — Z760 Encounter for issue of repeat prescription: Secondary | ICD-10-CM

## 2021-05-16 ENCOUNTER — Ambulatory Visit: Payer: Self-pay | Admitting: Family Medicine

## 2021-05-23 ENCOUNTER — Other Ambulatory Visit (INDEPENDENT_AMBULATORY_CARE_PROVIDER_SITE_OTHER): Payer: Self-pay | Admitting: Primary Care

## 2021-05-23 DIAGNOSIS — Z760 Encounter for issue of repeat prescription: Secondary | ICD-10-CM

## 2021-05-23 NOTE — Telephone Encounter (Signed)
Requested Prescriptions  Pending Prescriptions Disp Refills   omeprazole (PRILOSEC) 20 MG capsule [Pharmacy Med Name: Omeprazole 20 MG Oral Capsule Delayed Release] 30 capsule 2    Sig: Take 1 capsule by mouth once daily     Gastroenterology: Proton Pump Inhibitors Passed - 05/23/2021  9:02 PM      Passed - Valid encounter within last 12 months    Recent Outpatient Visits          1 month ago Chronic bilateral low back pain, unspecified whether sciatica present   [place], Michelle P, NP   4 months ago Irritation of both ears   [place] [name], NP   4 months ago Essential hypertension   [place], Michelle P, NP   7 months ago Annual physical exam   [place] [name], NP   7 months ago Essential hypertension   [place] [name], NP

## 2021-06-03 ENCOUNTER — Ambulatory Visit: Admission: EM | Admit: 2021-06-03 | Discharge: 2021-06-03 | Discharge status: Absent without leave | Payer: 59

## 2021-06-20 ENCOUNTER — Ambulatory Visit (INDEPENDENT_AMBULATORY_CARE_PROVIDER_SITE_OTHER): Payer: 59

## 2021-06-20 ENCOUNTER — Ambulatory Visit (HOSPITAL_COMMUNITY)
Admission: RE | Admit: 2021-06-20 | Discharge: 2021-06-20 | Disposition: A | Discharge status: Home / Self Care | Payer: 59 | Source: Ambulatory Visit | Attending: Emergency Medicine | Admitting: Emergency Medicine

## 2021-06-20 ENCOUNTER — Other Ambulatory Visit: Payer: Self-pay

## 2021-06-20 ENCOUNTER — Encounter (HOSPITAL_COMMUNITY): Payer: Self-pay

## 2021-06-20 VITALS — BP 147/95 | HR 97 | Temp 98.5°F | Resp 17

## 2021-06-20 DIAGNOSIS — M25571 Pain in right ankle and joints of right foot: Secondary | ICD-10-CM

## 2021-06-20 DIAGNOSIS — R051 Acute cough: Secondary | ICD-10-CM

## 2021-06-20 DIAGNOSIS — M25471 Effusion, right ankle: Secondary | ICD-10-CM | POA: Diagnosis not present

## 2021-06-20 MED ORDER — BENZONATATE 100 MG PO CAPS: 100.0000 mg | ORAL_CAPSULE | Freq: Three times a day (TID) | ORAL | 0 refills | Status: DC

## 2021-06-20 MED ORDER — NAPROXEN 500 MG PO TABS: 500.0000 mg | ORAL_TABLET | Freq: Two times a day (BID) | ORAL | 0 refills | Status: DC

## 2021-06-20 MED ORDER — PROMETHAZINE-DM 6.25-15 MG/5ML PO SYRP: 5.0000 mL | ORAL_SOLUTION | Freq: Four times a day (QID) | ORAL | 0 refills | Status: DC | PRN

## 2021-06-20 NOTE — Discharge Instructions (Addendum)
Your symptoms today are most likely being caused by a virus and should steadily improve in time it can take up to 7 to 10 days before you truly start to see a turnaround however things will get better    You can take Tylenol and/or Ibuprofen as needed for fever reduction and pain relief.  You may use Tessalon Perles every 8 hours to help calm coughing  You may also use cough syrup in addition every 6 hours as needed, be mindful of this medication make you drowsy   For cough: honey 1/2 to 1 teaspoon (you can dilute the honey in water or another fluid).  You can also use guaifenesin  for cough. You can use a humidifier for chest congestion and cough.  If you don't have a humidifier, you can sit in the bathroom with the hot shower running.      For congestion: take a daily anti-histamine like Zyrtec, Claritin, and a oral decongestant, such as pseudoephedrine.  You can also use Flonase 1-2 sprays in each nostril daily.   It is important to stay hydrated: drink plenty of fluids (water, gatorade/powerade/pedialyte, juices, or teas) to keep your throat moisturized and help further relieve irritation/discomfort.     Your x-ray today did not show injury to the bone, ligaments or tendons of your ankle. Your pain is most likely being caused by irritation to the soft tissues, this should improve as time progresses.   Take naproxen twice a day for 5 days then as needed, this medication is to help calm the swelling    You may apply heat or ice, whichever makes you feel better, to affected area in 15 minute intervals  You may continue activity as tolerated, there is no injury therefore, it is important that you continue to move around so you do not loose strength to the area  For the first 2-3 days you may wrap ankle with ace wrap for additional support while completing activities, once wrapped if you begin to experience numbness or tingling it is too tight, remove and redo, you should be able to easily fit  one finger under wrap   If symptoms persist past 2 weeks, you may follow up at urgent care or with orthopedic specialist for evaluation, an orthopedic doctor specializes in the bone, they may provide  management such as but not limited to imaging, long term medications and physical therapy

## 2021-06-20 NOTE — ED Provider Notes (Signed)
[place]    CSN: [id number] Arrival date & time: 06/20/21  8546      History   Chief Complaint Chief Complaint  Patient presents with   APPOINTMENT: Cough, Swollen Ankle    HPI [name] is a 61 y.o. male.   Patient presents with nasal congestion and a nonproductive cough for 1 week.  Coughing worsening at nighttime and congestion is worse primarily in the morning.  Tolerating food and liquids.  No known sick contacts.  Denies fever, chills, body aches, chest tightness, shortness of breath, wheezing, sore throat, ear pain, headaches.  History of anxiety, arthritis, hyperlipidemia, hypertension.  Former smoker.  Patient concerned with swollen right ankle for 1 week occurring after external rotation.  Swelling causing soreness.  Painful to bear weight.  Denies numbness, tingling, prior injury or trauma.  Has not attempted treatment of symptoms.   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    Patient Active Problem List   Diagnosis Date Noted   Chest pain of uncertain etiology 27/07/5007   Alcoholic pancreatitis 38/18/2993   Normal anion gap metabolic acidosis 71/69/6789   Radiculopathy 03/27/2018   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/02/2017   Renal stone 10/08/2016   Non-compliant behavior 12/02/2013   Routine health maintenance 01/27/2012   Hyperlipidemia 01/27/2012   Knee pain, left 11/12/2011   BPH (benign prostatic hyperplasia) 11/12/2011   GERD 08/10/2008   Essential hypertension 04/14/2008   Alcohol abuse 10/26/2007   TRIGGER FINGER, LEFT MIDDLE 05/12/2007   NEPHROLITHIASIS, HX OF 05/12/2007    Past Surgical History:   Procedure Laterality Date   A2 pulley flexor sheath cyst 4th finger excisional biopsy     '03   ANTERIOR CRUCIATE LIGAMENT REPAIR     R knee   ESOPHAGOGASTRODUODENOSCOPY N/A 07/09/2012   Procedure: ESOPHAGOGASTRODUODENOSCOPY (EGD);  Surgeon: Inda Castle, MD;  Location: [facility];  Service: Endoscopy;  Laterality: N/A;   KNEE SURGERY  2008   LEFT HEART CATH AND CORONARY ANGIOGRAPHY N/A 08/10/2019   Procedure: LEFT HEART CATH AND CORONARY ANGIOGRAPHY;  Surgeon: Lorretta Harp, MD;  Location: [place] [facility];  Service: Cardiovascular;  Laterality: N/A;   SHOULDER ARTHROSCOPY WITH SUBACROMIAL DECOMPRESSION AND OPEN ROTATOR C Right 12/26/2012   Procedure: RIGHT SHOULDER ARTHROSCOPY WITH SUBACROMIAL DECOMPRESSION MINI OPEN ROTATOR CUFF REPAIR, OPEN BICEP TENODESIS OPEN DISTAL CLAVICLE RESECTION  ;  Surgeon: Augustin Schooling, MD;  Location: [place];  Service: Orthopedics;  Laterality: Right;   TRANSFORAMINAL LUMBAR INTERBODY FUSION (TLIF) WITH PEDICLE SCREW FIXATION 1 LEVEL Right 03/27/2018   Procedure: RIGHT-SIDED LUMBAR 4-5 TRANSFORAMINAL LUMBAR INTERBODY FUSION WITH INSTRUMENTATION AND ALLOGRAFT;  Surgeon: Phylliss Bob, MD;  Location: [place];  Service: Orthopedics;  Laterality: Right;   URETEROSCOPY     and stone retreval       Home Medications    Prior to Admission medications   Medication Sig Start Date End Date Taking? Authorizing Provider  acetaminophen (TYLENOL) 500 MG tablet Take 2 tablets (1,000 mg total) by mouth every 6 (six) hours as needed. 04/04/20  Charlesetta Shanks, MD  amitriptyline (ELAVIL) 25 MG tablet Take 1 tablet (25 mg total) by mouth at bedtime. Patient not taking: Reported on 02/28/2021 09/20/20   Penumalli, Earlean Polka, MD  amLODipine (NORVASC) 10 MG tablet Take 1 tablet by mouth once daily 12/18/20   [name], NP  aspirin 81 MG EC tablet TAKE 1 TABLET (81 MG TOTAL) BY MOUTH DAILY. SWALLOW WHOLE. 06/30/20 06/30/21  [name], NP  atorvastatin  (LIPITOR) 40 MG tablet Take 1 tablet by mouth once daily 09/19/20   Lorretta Harp, MD  carvedilol (COREG) 25 MG tablet Take 1 tablet (25 mg total) by mouth 2 (two) times daily with a meal. 01/04/21   [name], NP  chlorhexidine (PERIDEX) 0.12 % solution Use as directed 15 mLs in the mouth or throat 2 (two) times daily. 04/05/21   [name], NP  cyclobenzaprine (FLEXERIL) 10 MG tablet Take 1 tablet (10 mg total) by mouth 3 (three) times daily as needed for muscle spasms. 01/04/21   [name], NP  FIBER PO Take 1 capsule by mouth daily.    [provider]  losartan (COZAAR) 100 MG tablet Take 1 tablet (100 mg total) by mouth daily. 10/10/20   [name], NP  Multiple Vitamins-Minerals (MULTIVITAMIN WITH MINERALS) tablet Take 1 tablet by mouth daily.    [provider]  omeprazole (PRILOSEC) 20 MG capsule Take 1 capsule by mouth once daily 05/23/21   [name], NP  potassium chloride SA (KLOR-CON) 20 MEQ tablet Take 2 tablets (40 mEq total) by mouth 2 (two) times daily for 1 day. 01/12/21 01/13/21  Kommor, Madison, MD  Rimegepant Sulfate (NURTEC) 75 MG TBDP Take 75 mg by mouth daily as needed. 09/19/20   Penumalli, Earlean Polka, MD  rizatriptan (MAXALT-MLT) 10 MG disintegrating tablet Take 1 tablet (10 mg total) by mouth as needed for migraine. May repeat in 2 hours if needed 09/19/20   Penumalli, Earlean Polka, MD  Tetrahydrozoline HCl (VISINE OP) Place 1 drop into both eyes daily as needed (redness).    [provider]  traZODone (DESYREL) 100 MG tablet TAKE 1 TABLET (100 MG TOTAL) BY MOUTH AT BEDTIME. Patient not taking: Reported on 02/28/2021 01/04/21 01/04/22  [name], NP  triamcinolone cream (KENALOG) 0.1 % Apply 1 application topically 2 (two) times daily. 09/08/19   Scot Jun, FNP  diphenhydrAMINE (BENADRYL) 25 MG tablet Take 1 tablet (25 mg total) by mouth every 6 (six) hours as needed. 04/04/20 06/17/20  Charlesetta Shanks, MD  metoCLOPramide (REGLAN) 10 MG tablet Take 1 tablet (10 mg total) by mouth every 6 (six) hours as needed for nausea (nausea/headache). Take this tablet with 25 mg of Benadryl and two extra strength Tylenol for headache. 04/27/20 06/17/20  [name], NP    Family History Family History  Problem Relation Age of Onset   Hypertension Father        X4 siblings   Heart disease Father    Diabetes Mother        x2 brothers    Social History Social History   Tobacco Use   Smoking status: Former    Types: Cigarettes    Quit date: 07/10/1998    Years since quitting: 22.9   Smokeless tobacco: Never  Vaping Use   Vaping Use: Never used  Substance Use Topics   Alcohol use: Yes    Alcohol/week: 6.0 standard drinks    Types: 6 Cans  of beer per week    Comment: 1 6 pack a week   Drug use: No     Allergies   Hydrocodone-acetaminophen and Shrimp [shellfish allergy]   Review of Systems Review of Systems Defer to HPI    Physical Exam Triage Vital Signs ED Triage Vitals  Enc Vitals Group     BP 06/20/21 1008 (!) 147/95     Pulse Rate 06/20/21 1008 97     Resp 06/20/21 1008 17     Temp 06/20/21 1008 98.5 F (36.9 C)     Temp Source 06/20/21 1008 Oral     SpO2 06/20/21 1008 97 %     Weight --      Height --      Head Circumference --      Peak Flow --      Pain Score 06/20/21 1012 7     Pain Loc --      Pain Edu? --      Excl. in [place]? --    No data found.  Updated Vital Signs BP (!) 147/95 (BP Location: Right Arm)    Pulse 97    Temp 98.5 F (36.9 C) (Oral)    Resp 17    SpO2 97%   Visual Acuity Right Eye Distance:   Left Eye Distance:   Bilateral Distance:    Right Eye Near:   Left Eye Near:    Bilateral Near:     Physical Exam Constitutional:      Appearance: Normal appearance.  HENT:     Head: Normocephalic.     Right Ear: Tympanic membrane, ear canal and external ear normal.     Left Ear: Tympanic membrane, ear canal and external ear  normal.     Nose: Congestion present. No rhinorrhea.     Mouth/Throat:     Mouth: Mucous membranes are moist.     Pharynx: Oropharynx is clear.  Eyes:     Extraocular Movements: Extraocular movements intact.  Cardiovascular:     Rate and Rhythm: Normal rate and regular rhythm.     Pulses: Normal pulses.     Heart sounds: Normal heart sounds.  Pulmonary:     Effort: Pulmonary effort is normal.     Breath sounds: Normal breath sounds.  Musculoskeletal:     Cervical back: Normal range of motion and neck supple.     Comments: Tenderness and swelling along the medial aspect of the right ankle, no point tenderness noted, no ecchymosis noted range of motion intact, plus pedal pulse, sensation intact  Skin:    General: Skin is warm and dry.  Neurological:     Mental Status: He is alert and oriented to person, place, and time. Mental status is at baseline.  Psychiatric:        Behavior: Behavior normal.     UC Treatments / Results  Labs (all labs ordered are listed, but only abnormal results are displayed) Labs Reviewed - No data to display  EKG   Radiology No results found.  Procedures Procedures (including critical care time)  Medications Ordered in UC Medications - No data to display  Initial Impression / Assessment and Plan / UC Course  I have reviewed the triage vital signs and the nursing notes.  Pertinent labs & imaging results that were available during my care of the patient were reviewed by me and considered in my medical decision making (see chart for details).  Acute cough Right ankle swelling   Etiology of  symptoms is most likely viral, discussed with patient, vitals are stable and patient is in no signs of distress, will defer COVID and flu testing as symptoms have already been present 1 week, prescribed Tessalon and Promethazine DM for management, may use over-the-counter medication, will increase fluid intake, warm teas, honey for additional supportive care,  may follow-up with urgent care as needed   Right ankle x-ray negative, discussed findings with patient, prescribed naproxen 500 mg twice daily for 5 to 7 days to help reduce swelling, recommended RICE, Ace wrap for support, heat in 15-minute intervals and activity as tolerated, given walking referral to orthopedics for persistent symptoms Final Clinical Impressions(s) / UC Diagnoses   Final diagnoses:  None   Discharge Instructions   None    ED Prescriptions   None    PDMP not reviewed this encounter.   Hans Eden, NP 06/21/21 1905

## 2021-06-20 NOTE — ED Triage Notes (Signed)
Pt presents with productive cough X 1 week.  Pt presents with swollen right ankle X 1 week.

## 2021-06-24 ENCOUNTER — Other Ambulatory Visit (INDEPENDENT_AMBULATORY_CARE_PROVIDER_SITE_OTHER): Payer: Self-pay | Admitting: Primary Care

## 2021-06-24 ENCOUNTER — Other Ambulatory Visit: Payer: Self-pay | Admitting: Cardiovascular Disease

## 2021-06-24 DIAGNOSIS — I10 Essential (primary) hypertension: Secondary | ICD-10-CM

## 2021-06-24 DIAGNOSIS — E781 Pure hyperglyceridemia: Secondary | ICD-10-CM

## 2021-06-24 DIAGNOSIS — E785 Hyperlipidemia, unspecified: Secondary | ICD-10-CM

## 2021-06-26 ENCOUNTER — Other Ambulatory Visit: Payer: Self-pay

## 2021-06-26 NOTE — Telephone Encounter (Signed)
Requested Prescriptions  Pending Prescriptions Disp Refills   amLODipine (NORVASC) 10 MG tablet [Pharmacy Med Name: amLODIPine Besylate 10 MG Oral Tablet] 90 tablet 0    Sig: Take 1 tablet by mouth once daily     Cardiovascular: Calcium Channel Blockers 2 Failed - 06/24/2021  5:30 AM      Failed - Last BP in normal range    BP Readings from Last 1 Encounters:  06/20/21 (!) 147/95         Passed - Last Heart Rate in normal range    Pulse Readings from Last 1 Encounters:  06/20/21 97         Passed - Valid encounter within last 6 months    Recent Outpatient Visits          2 months ago Chronic bilateral low back pain, unspecified whether sciatica present   [place] [name], NP   5 months ago Irritation of both ears   [place], Michelle P, NP   5 months ago Essential hypertension   [place], Michelle P, NP   8 months ago Annual physical exam   [place] [name], NP   8 months ago Essential hypertension   [place] [name], NP

## 2021-08-09 ENCOUNTER — Other Ambulatory Visit (INDEPENDENT_AMBULATORY_CARE_PROVIDER_SITE_OTHER): Payer: Self-pay | Admitting: Primary Care

## 2021-08-09 DIAGNOSIS — M545 Low back pain, unspecified: Secondary | ICD-10-CM

## 2021-08-09 NOTE — Telephone Encounter (Signed)
Routed to PCP 

## 2021-08-17 ENCOUNTER — Encounter: Payer: Self-pay | Admitting: Emergency Medicine

## 2021-08-17 ENCOUNTER — Ambulatory Visit
Admission: EM | Admit: 2021-08-17 | Discharge: 2021-08-17 | Disposition: A | Discharge status: Home / Self Care | Payer: 59 | Attending: Internal Medicine | Admitting: Internal Medicine

## 2021-08-17 DIAGNOSIS — L02411 Cutaneous abscess of right axilla: Secondary | ICD-10-CM | POA: Diagnosis not present

## 2021-08-17 MED ORDER — DOXYCYCLINE HYCLATE 100 MG PO CAPS: 100.0000 mg | ORAL_CAPSULE | Freq: Two times a day (BID) | ORAL | 0 refills | Status: DC

## 2021-08-17 NOTE — ED Triage Notes (Signed)
Patient states that he has an abscess under right axilla x 4 days.  Started out smaller then has gotten bigger with drainage.  The drainage does have an odor.  ?

## 2021-08-17 NOTE — Discharge Instructions (Signed)
You abscess has been drained. You have been prescribed antibiotics. Please follow up if symptoms persist or worsen.  ?

## 2021-08-17 NOTE — ED Provider Notes (Signed)
?[place] ? ? ? ?CSN: [id number] ?Arrival date & time: 08/17/21  1338 ? ? ?  ? ?History   ?Chief Complaint ?Chief Complaint  ?Patient presents with  ? Abscess  ? ? ?HPI ?[name] is a 61 y.o. male.  ? ?Patient presents with abscess to right axilla that has been present for approximately 4 days.  Denies history of the same.  Patient reports that it has gotten larger and is starting to drain purulent drainage.  Denies any fevers, body aches, chills. ? ? ?Abscess ? ?Past Medical History:  ?Diagnosis Date  ? Alcohol abuse   ? Anxiety   ? Arthritis   ? Diverticulosis of colon (without mention of hemorrhage)   ? GERD (gastroesophageal reflux disease)   ? History of kidney stones   ? Hyperlipemia   ? Hypertension   ? dr Ronalee Belts  norins  ? Kidney stones   ? Nephrolithiasis   ? Pancreatitis   ? Hx of   ? Ureteral stent displacement ([place]) 1990  ? ? ?Patient Active Problem List  ? Diagnosis Date Noted  ? Chest pain of uncertain etiology 76/16/0737  ? Alcoholic pancreatitis 10/62/6948  ? Normal anion gap metabolic acidosis 54/62/7035  ? Radiculopathy 03/27/2018  ? 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/02/2017  ? Renal stone 10/08/2016  ? Non-compliant behavior 12/02/2013  ? Routine health maintenance 01/27/2012  ? Hyperlipidemia 01/27/2012  ? Knee pain, left 11/12/2011  ? BPH (benign prostatic hyperplasia) 11/12/2011  ? GERD 08/10/2008  ? Essential hypertension 04/14/2008  ? Alcohol abuse 10/26/2007  ? TRIGGER FINGER, LEFT MIDDLE 05/12/2007  ? NEPHROLITHIASIS, HX OF 05/12/2007  ? ? ?Past Surgical History:  ?Procedure Laterality Date  ? A2 pulley flexor sheath cyst 4th finger excisional biopsy    ? '03  ? ANTERIOR CRUCIATE LIGAMENT REPAIR    ? R knee  ? ESOPHAGOGASTRODUODENOSCOPY N/A 07/09/2012  ? Procedure: ESOPHAGOGASTRODUODENOSCOPY (EGD);  Surgeon: Inda Castle, MD;  Location: [facility];  Service: Endoscopy;  Laterality: N/A;  ? KNEE SURGERY  2008  ? LEFT HEART CATH AND CORONARY ANGIOGRAPHY N/A 08/10/2019  ?  Procedure: LEFT HEART CATH AND CORONARY ANGIOGRAPHY;  Surgeon: Lorretta Harp, MD;  Location: [place] [facility];  Service: Cardiovascular;  Laterality: N/A;  ? SHOULDER ARTHROSCOPY WITH SUBACROMIAL DECOMPRESSION AND OPEN ROTATOR C Right 12/26/2012  ? Procedure: RIGHT SHOULDER ARTHROSCOPY WITH SUBACROMIAL DECOMPRESSION MINI OPEN ROTATOR CUFF REPAIR, OPEN BICEP TENODESIS OPEN DISTAL CLAVICLE RESECTION  ;  Surgeon: Augustin Schooling, MD;  Location: [place];  Service: Orthopedics;  Laterality: Right;  ? TRANSFORAMINAL LUMBAR INTERBODY FUSION (TLIF) WITH PEDICLE SCREW FIXATION 1 LEVEL Right 03/27/2018  ? Procedure: RIGHT-SIDED LUMBAR 4-5 TRANSFORAMINAL LUMBAR INTERBODY FUSION WITH INSTRUMENTATION AND ALLOGRAFT;  Surgeon: Phylliss Bob, MD;  Location: [place];  Service: Orthopedics;  Laterality: Right;  ? URETEROSCOPY    ? and stone retreval  ? ? ? ? ? ?Home Medications   ? ?Prior to Admission medications   ?Medication Sig Start Date End Date Taking? Authorizing Provider  ?acetaminophen (TYLENOL) 500 MG tablet Take 2 tablets (1,000 mg total) by mouth every 6 (six) hours as needed. 04/04/20  Yes Charlesetta Shanks, MD  ?amLODipine (NORVASC) 10 MG tablet Take 1 tablet by mouth once daily 06/26/21  Yes [name], NP  ?atorvastatin (LIPITOR) 40 MG tablet Take 1 tablet by mouth once daily 06/26/21  Yes Lorretta Harp, MD  ?carvedilol (COREG) 25 MG tablet Take 1 tablet (25 mg total) by  mouth 2 (two) times daily with a meal. 01/04/21  Yes [name], NP  ?cyclobenzaprine (FLEXERIL) 10 MG tablet Take 1 tablet by mouth three times daily as needed for muscle spasm 08/09/21  Yes [name], NP  ?doxycycline (VIBRAMYCIN) 100 MG capsule Take 1 capsule (100 mg total) by mouth 2 (two) times daily. 08/17/21  Yes Teodora Medici, FNP  ?FIBER PO Take 1 capsule by mouth daily.   Yes [provider]  ?losartan (COZAAR) 100 MG tablet Take 1 tablet (100 mg total) by mouth daily. 10/10/20  Yes [name], NP   ?Multiple Vitamins-Minerals (MULTIVITAMIN WITH MINERALS) tablet Take 1 tablet by mouth daily.   Yes [provider]  ?naproxen (NAPROSYN) 500 MG tablet Take 1 tablet (500 mg total) by mouth 2 (two) times daily. 06/20/21  Yes Hans Eden, NP  ?omeprazole (PRILOSEC) 20 MG capsule Take 1 capsule by mouth once daily 05/23/21  Yes [name], NP  ?Rimegepant Sulfate (NURTEC) 75 MG TBDP Take 75 mg by mouth daily as needed. 09/19/20  Yes Penumalli, Earlean Polka, MD  ?rizatriptan (MAXALT-MLT) 10 MG disintegrating tablet Take 1 tablet (10 mg total) by mouth as needed for migraine. May repeat in 2 hours if needed 09/19/20  Yes Penumalli, Earlean Polka, MD  ?Tetrahydrozoline HCl (VISINE OP) Place 1 drop into both eyes daily as needed (redness).   Yes [provider]  ?triamcinolone cream (KENALOG) 0.1 % Apply 1 application topically 2 (two) times daily. 09/08/19  Yes Scot Jun, FNP  ?benzonatate (TESSALON) 100 MG capsule Take 1 capsule (100 mg total) by mouth every 8 (eight) hours. 06/20/21   Hans Eden, NP  ?chlorhexidine (PERIDEX) 0.12 % solution Use as directed 15 mLs in the mouth or throat 2 (two) times daily. 04/05/21   [name], NP  ?potassium chloride SA (KLOR-CON) 20 MEQ tablet Take 2 tablets (40 mEq total) by mouth 2 (two) times daily for 1 day. 01/12/21 01/13/21  Kommor, Madison, MD  ?promethazine-dextromethorphan (PROMETHAZINE-DM) 6.25-15 MG/5ML syrup Take 5 mLs by mouth 4 (four) times daily as needed for cough. 06/20/21   White, Leitha Schuller, NP  ?traZODone (DESYREL) 100 MG tablet TAKE 1 TABLET (100 MG TOTAL) BY MOUTH AT BEDTIME. ?Patient not taking: Reported on 02/28/2021 01/04/21 01/04/22  [name], NP  ?diphenhydrAMINE (BENADRYL) 25 MG tablet Take 1 tablet (25 mg total) by mouth every 6 (six) hours as needed. 04/04/20 06/17/20  Charlesetta Shanks, MD  ?metoCLOPramide (REGLAN) 10 MG tablet Take 1 tablet (10 mg total) by mouth every 6 (six) hours as needed for nausea  (nausea/headache). Take this tablet with 25 mg of Benadryl and two extra strength Tylenol for headache. 04/27/20 06/17/20  [name], NP  ? ? ?Family History ?Family History  ?Problem Relation Age of Onset  ? Hypertension Father   ?     X4 siblings  ? Heart disease Father   ? Diabetes Mother   ?     x2 brothers  ? ? ?Social History ?Social History  ? ?Tobacco Use  ? Smoking status: Former  ?  Types: Cigarettes  ?  Quit date: 07/10/1998  ?  Years since quitting: 23.1  ? Smokeless tobacco: Never  ?Vaping Use  ? Vaping Use: Never used  ?Substance Use Topics  ? Alcohol use: Yes  ?  Alcohol/week: 6.0 standard drinks  ?  Types: 6 Cans of beer per week  ?  Comment: 1 6 pack a  week  ? Drug use: No  ? ? ? ?Allergies   ?Hydrocodone-acetaminophen and Shrimp [shellfish allergy] ? ? ?Review of Systems ?Review of Systems ?Per HPI ? ?Physical Exam ?Triage Vital Signs ?ED Triage Vitals [08/17/21 1353]  ?Enc Vitals Group  ?   BP 118/76  ?   Pulse Rate 95  ?   Resp 18  ?   Temp 98.8 ?F (37.1 ?C)  ?   Temp Source Oral  ?   SpO2 95 %  ?   Weight 253 lb 8.5 oz (115 kg)  ?   Height '5\' 9"'$  (1.753 m)  ?   Head Circumference   ?   Peak Flow   ?   Pain Score 7  ?   Pain Loc   ?   Pain Edu?   ?   Excl. in [place]?   ? ?No data found. ? ?Updated Vital Signs ?BP 118/76 (BP Location: Left Arm)   Pulse 95   Temp 98.8 ?F (37.1 ?C) (Oral)   Resp 18   Ht '5\' 9"'$  (1.753 m)   Wt 253 lb 8.5 oz (115 kg)   SpO2 95%   BMI 37.44 kg/m?  ? ?Visual Acuity ?Right Eye Distance:   ?Left Eye Distance:   ?Bilateral Distance:   ? ?Right Eye Near:   ?Left Eye Near:    ?Bilateral Near:    ? ?Physical Exam ?Constitutional:   ?   General: He is not in acute distress. ?   Appearance: Normal appearance. He is not toxic-appearing or diaphoretic.  ?HENT:  ?   Head: Normocephalic and atraumatic.  ?Eyes:  ?   Extraocular Movements: Extraocular movements intact.  ?   Conjunctiva/sclera: Conjunctivae normal.  ?Pulmonary:  ?   Effort: Pulmonary effort is normal.  ?Skin: ?    Findings: Abscess present.  ?   Comments: Approximately 2.5 to 3 cm in diameter fluctuant abscess present to right axilla.   ?Neurological:  ?   General: No focal deficit present.  ?   Mental Status: He is ale

## 2021-08-23 ENCOUNTER — Encounter (HOSPITAL_COMMUNITY): Payer: Self-pay

## 2021-08-23 ENCOUNTER — Ambulatory Visit (HOSPITAL_COMMUNITY)
Admission: EM | Admit: 2021-08-23 | Discharge: 2021-08-23 | Disposition: A | Discharge status: Home / Self Care | Payer: 59 | Attending: Nurse Practitioner | Admitting: Nurse Practitioner

## 2021-08-23 ENCOUNTER — Telehealth (HOSPITAL_COMMUNITY): Payer: Self-pay | Admitting: Nurse Practitioner

## 2021-08-23 DIAGNOSIS — K625 Hemorrhage of anus and rectum: Secondary | ICD-10-CM | POA: Insufficient documentation

## 2021-08-23 LAB — CBC WITH DIFFERENTIAL/PLATELET
Abs Immature Granulocytes: 0.05 10*3/uL (ref 0.00–0.07)
Basophils Absolute: 0.1 10*3/uL (ref 0.0–0.1)
Basophils Relative: 1 %
Eosinophils Absolute: 0.1 10*3/uL (ref 0.0–0.5)
Eosinophils Relative: 2 %
HCT: 33.4 % — ABNORMAL LOW (ref 39.0–52.0)
Hemoglobin: 11 g/dL — ABNORMAL LOW (ref 13.0–17.0)
Immature Granulocytes: 1 %
Lymphocytes Relative: 30 %
Lymphs Abs: 2.7 10*3/uL (ref 0.7–4.0)
MCH: 31.7 pg (ref 26.0–34.0)
MCHC: 32.9 g/dL (ref 30.0–36.0)
MCV: 96.3 fL (ref 80.0–100.0)
Monocytes Absolute: 1.4 10*3/uL — ABNORMAL HIGH (ref 0.1–1.0)
Monocytes Relative: 16 %
Neutro Abs: 4.8 10*3/uL (ref 1.7–7.7)
Neutrophils Relative %: 50 %
Platelets: 195 10*3/uL (ref 150–400)
RBC: 3.47 MIL/uL — ABNORMAL LOW (ref 4.22–5.81)
RDW: 13.6 % (ref 11.5–15.5)
WBC: 9.1 10*3/uL (ref 4.0–10.5)
nRBC: 0 % (ref 0.0–0.2)

## 2021-08-23 LAB — BASIC METABOLIC PANEL
Anion gap: 7 (ref 5–15)
BUN: 9 mg/dL (ref 6–20)
CO2: 27 mmol/L (ref 22–32)
Calcium: 8.8 mg/dL — ABNORMAL LOW (ref 8.9–10.3)
Chloride: 106 mmol/L (ref 98–111)
Creatinine, Ser: 1.08 mg/dL (ref 0.61–1.24)
GFR, Estimated: >60 mL/min (ref >60)
Glucose, Bld: 107 mg/dL — ABNORMAL HIGH (ref 70–99)
Potassium: 3.4 mmol/L — ABNORMAL LOW (ref 3.5–5.1)
Sodium: 140 mmol/L (ref 135–145)

## 2021-08-23 LAB — POCT URINALYSIS DIPSTICK, ED / UC
Bilirubin Urine: NEGATIVE
Glucose, UA: NEGATIVE mg/dL
Hgb urine dipstick: NEGATIVE
Leukocytes,Ua: NEGATIVE
Nitrite: NEGATIVE
Protein, ur: 30 mg/dL — AB
Specific Gravity, Urine: >=1.03 (ref 1.005–1.030)
Urobilinogen, UA: 1 mg/dL (ref 0.0–1.0)
pH: 5.5 (ref 5.0–8.0)

## 2021-08-23 NOTE — ED Provider Notes (Signed)
?[facility] ? ? ? ?CSN: [id number] ?Arrival date & time: 08/23/21  1153 ? ? ?  ? ?History   ?Chief Complaint ?Chief Complaint  ?Patient presents with  ? Rectal Bleeding  ? ? ?HPI ?[name] is a 61 y.o. male.  ? ?The patient is a 61 year old male who presents with rectal bleeding.  Patient states that his symptoms started 3 days ago.  The patient reports 5 episodes of rectal bleeding since his symptoms started.  Patient states he was seen at the [facility] urgent care on [address]. An I & D of an axillary abscess was performed, and patient was started on doxycycline.  Patient states he started the doxycycline that night, but noticed that he had blood in his stool starting the next day.  He states since that time, he has noticed blood in his stool.  He describes the blood as dark, and states that looks similar to coffee grounds.  He denies diarrhea, vomiting, fever, chills, or abdominal pain.  He states that he has had intermittent nausea as he did not always take the doxycycline with food.  He further denies dizziness, lightheadedness, rectal pain, hemorrhoids  He states that he is currently taking 81 mg aspirin, which he is taken daily for quite some time.  He also is taking Coricidin HBP for a current upper respiratory infection. Patient was seen by GI in the past for similar symptoms. Patient states he was drinking at that time, denies alcohol use currently. He had an EGD which showed gastritis. He also has a history of diverticulosis and GERD.   ? ?The history is provided by the patient.  ?Rectal Bleeding ?Chronicity:  Recurrent ?Context: not defecation, not hemorrhoids and not rectal pain   ?Similar prior episodes: no   ?Associated symptoms: no abdominal pain, no dizziness, no fever, no hematemesis and no vomiting   ?Risk factors: no NSAID use and no steroid use   ? ?Past Medical History:  ?Diagnosis Date  ? Alcohol abuse   ? Anxiety   ? Arthritis   ? Diverticulosis of colon (without mention of  hemorrhage)   ? GERD (gastroesophageal reflux disease)   ? History of kidney stones   ? Hyperlipemia   ? Hypertension   ? dr Ronalee Belts  norins  ? Kidney stones   ? Nephrolithiasis   ? Pancreatitis   ? Hx of   ? Ureteral stent displacement ([place]) 1990  ? ? ?Patient Active Problem List  ? Diagnosis Date Noted  ? Chest pain of uncertain etiology 25/09/3974  ? Alcoholic pancreatitis 73/41/9379  ? Normal anion gap metabolic acidosis 02/40/9735  ? Radiculopathy 03/27/2018  ? 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/02/2017  ? Renal stone 10/08/2016  ? Non-compliant behavior 12/02/2013  ? Routine health maintenance 01/27/2012  ? Hyperlipidemia 01/27/2012  ? Knee pain, left 11/12/2011  ? BPH (benign prostatic hyperplasia) 11/12/2011  ? GERD 08/10/2008  ? Essential hypertension 04/14/2008  ? Alcohol abuse 10/26/2007  ? TRIGGER FINGER, LEFT MIDDLE 05/12/2007  ? NEPHROLITHIASIS, HX OF 05/12/2007  ? ? ?Past Surgical History:  ?Procedure Laterality Date  ? A2 pulley flexor sheath cyst 4th finger excisional biopsy    ? '03  ? ANTERIOR CRUCIATE LIGAMENT REPAIR    ? R knee  ? ESOPHAGOGASTRODUODENOSCOPY N/A 07/09/2012  ? Procedure: ESOPHAGOGASTRODUODENOSCOPY (EGD);  Surgeon: Inda Castle, MD;  Location: [facility];  Service: Endoscopy;  Laterality: N/A;  ? KNEE SURGERY  2008  ? LEFT HEART CATH AND CORONARY ANGIOGRAPHY  N/A 08/10/2019  ? Procedure: LEFT HEART CATH AND CORONARY ANGIOGRAPHY;  Surgeon: Lorretta Harp, MD;  Location: [facility];  Service: Cardiovascular;  Laterality: N/A;  ? SHOULDER ARTHROSCOPY WITH SUBACROMIAL DECOMPRESSION AND OPEN ROTATOR C Right 12/26/2012  ? Procedure: RIGHT SHOULDER ARTHROSCOPY WITH SUBACROMIAL DECOMPRESSION MINI OPEN ROTATOR CUFF REPAIR, OPEN BICEP TENODESIS OPEN DISTAL CLAVICLE RESECTION  ;  Surgeon: Augustin Schooling, MD;  Location: [facility];  Service: Orthopedics;  Laterality: Right;  ? TRANSFORAMINAL LUMBAR INTERBODY FUSION (TLIF) WITH PEDICLE SCREW FIXATION 1 LEVEL Right 03/27/2018  ?  Procedure: RIGHT-SIDED LUMBAR 4-5 TRANSFORAMINAL LUMBAR INTERBODY FUSION WITH INSTRUMENTATION AND ALLOGRAFT;  Surgeon: Phylliss Bob, MD;  Location: [facility];  Service: Orthopedics;  Laterality: Right;  ? URETEROSCOPY    ? and stone retreval  ? ? ? ? ? ?Home Medications   ? ?Prior to Admission medications   ?Medication Sig Start Date End Date Taking? Authorizing Provider  ?acetaminophen (TYLENOL) 500 MG tablet Take 2 tablets (1,000 mg total) by mouth every 6 (six) hours as needed. 04/04/20   Charlesetta Shanks, MD  ?amLODipine (NORVASC) 10 MG tablet Take 1 tablet by mouth once daily 06/26/21   [name], NP  ?atorvastatin (LIPITOR) 40 MG tablet Take 1 tablet by mouth once daily 06/26/21   Lorretta Harp, MD  ?benzonatate (TESSALON) 100 MG capsule Take 1 capsule (100 mg total) by mouth every 8 (eight) hours. 06/20/21   Hans Eden, NP  ?carvedilol (COREG) 25 MG tablet Take 1 tablet (25 mg total) by mouth 2 (two) times daily with a meal. 01/04/21   [name], NP  ?chlorhexidine (PERIDEX) 0.12 % solution Use as directed 15 mLs in the mouth or throat 2 (two) times daily. 04/05/21   [name], NP  ?cyclobenzaprine (FLEXERIL) 10 MG tablet Take 1 tablet by mouth three times daily as needed for muscle spasm 08/09/21   [name], NP  ?doxycycline (VIBRAMYCIN) 100 MG capsule Take 1 capsule (100 mg total) by mouth 2 (two) times daily. 08/17/21   Teodora Medici, FNP  ?FIBER PO Take 1 capsule by mouth daily.    [provider]  ?losartan (COZAAR) 100 MG tablet Take 1 tablet (100 mg total) by mouth daily. 10/10/20   [name], NP  ?Multiple Vitamins-Minerals (MULTIVITAMIN WITH MINERALS) tablet Take 1 tablet by mouth daily.    [provider]  ?naproxen (NAPROSYN) 500 MG tablet Take 1 tablet (500 mg total) by mouth 2 (two) times daily. 06/20/21   Hans Eden, NP  ?omeprazole (PRILOSEC) 20 MG capsule Take 1 capsule by mouth once daily 05/23/21   [name],  NP  ?potassium chloride SA (KLOR-CON) 20 MEQ tablet Take 2 tablets (40 mEq total) by mouth 2 (two) times daily for 1 day. 01/12/21 01/13/21  Kommor, Madison, MD  ?promethazine-dextromethorphan (PROMETHAZINE-DM) 6.25-15 MG/5ML syrup Take 5 mLs by mouth 4 (four) times daily as needed for cough. 06/20/21   Hans Eden, NP  ?Rimegepant Sulfate (NURTEC) 75 MG TBDP Take 75 mg by mouth daily as needed. 09/19/20   Penumalli, Earlean Polka, MD  ?rizatriptan (MAXALT-MLT) 10 MG disintegrating tablet Take 1 tablet (10 mg total) by mouth as needed for migraine. May repeat in 2 hours if needed 09/19/20   Penumalli, Earlean Polka, MD  ?Tetrahydrozoline HCl (VISINE OP) Place 1 drop into both eyes daily as needed (redness).    [provider]  ?traZODone (DESYREL) 100 MG tablet TAKE 1  TABLET (100 MG TOTAL) BY MOUTH AT BEDTIME. ?Patient not taking: Reported on 02/28/2021 01/04/21 01/04/22  [name], NP  ?triamcinolone cream (KENALOG) 0.1 % Apply 1 application topically 2 (two) times daily. 09/08/19   Scot Jun, FNP  ?diphenhydrAMINE (BENADRYL) 25 MG tablet Take 1 tablet (25 mg total) by mouth every 6 (six) hours as needed. 04/04/20 06/17/20  Charlesetta Shanks, MD  ?metoCLOPramide (REGLAN) 10 MG tablet Take 1 tablet (10 mg total) by mouth every 6 (six) hours as needed for nausea (nausea/headache). Take this tablet with 25 mg of Benadryl and two extra strength Tylenol for headache. 04/27/20 06/17/20  [name], NP  ? ? ?Family History ?Family History  ?Problem Relation Age of Onset  ? Hypertension Father   ?     X4 siblings  ? Heart disease Father   ? Diabetes Mother   ?     x2 brothers  ? ? ?Social History ?Social History  ? ?Tobacco Use  ? Smoking status: Former  ?  Types: Cigarettes  ?  Quit date: 07/10/1998  ?  Years since quitting: 23.1  ? Smokeless tobacco: Never  ?Vaping Use  ? Vaping Use: Never used  ?Substance Use Topics  ? Alcohol use: Yes  ?  Alcohol/week: 6.0 standard drinks  ?  Types: 6 Cans of beer per  week  ?  Comment: 1 6 pack a week  ? Drug use: No  ? ? ? ?Allergies   ?Hydrocodone-acetaminophen and Shrimp [shellfish allergy] ? ? ?Review of Systems ?Review of Systems  ?Constitutional:  Negative for fever.  ?R

## 2021-08-23 NOTE — Telephone Encounter (Signed)
Spoke with the patient regarding his CBC results.  Patient advised that his hgb and hct were lower than the results in September, 2022. Patient advised to follow-up with his PCP and [facility], information was provided during discharge. Patient verbalizes understanding, all questions were answered.  ?

## 2021-08-23 NOTE — ED Triage Notes (Signed)
Pt was seen at [facility] urgent care for axillary abscess on 08/17/21. Pt was prescribed Doxycyline and states since taken this medication he has had bloody stools. Pt reports he has stopped taken medication since he is unsure if that is causing the bloody stools. ?

## 2021-08-23 NOTE — Discharge Instructions (Addendum)
Stop the doxycycline.  Hold your aspirin for 48 hours. ?A CBC and metabolic panel were collected today.  I will call with your results. Your urinalysis was negative for blood or infection today. ?Follow-up with GI for further evaluation, [facility], Dr. Erskine Emery - [phone]. ?Follow-up in the ED if you develop lightheadedness, dizziness, chest pain, shortness of breath, worsening rectal bleeding, abdominal pain, or other concerns. ?

## 2021-08-26 ENCOUNTER — Other Ambulatory Visit (INDEPENDENT_AMBULATORY_CARE_PROVIDER_SITE_OTHER): Payer: Self-pay | Admitting: Primary Care

## 2021-08-26 DIAGNOSIS — Z760 Encounter for issue of repeat prescription: Secondary | ICD-10-CM

## 2021-08-28 ENCOUNTER — Encounter: Payer: Self-pay | Admitting: Internal Medicine

## 2021-09-13 ENCOUNTER — Ambulatory Visit (INDEPENDENT_AMBULATORY_CARE_PROVIDER_SITE_OTHER): Payer: Self-pay

## 2021-09-13 NOTE — Progress Notes (Deleted)
No chief complaint on file.   HISTORY OF PRESENT ILLNESS:  09/13/21 ALL:  [name] is a 61 y.o. male here today for follow up for migraines. He was seen in consult with Dr Leta Baptist for headaches in 09/2020. MRI unremarkable. He was started on Amovig but not covered. He was then advised to start amitriptyline '25mg'$  QHS. Nurtec used for abortive therapy.    HISTORY (copied from Dr Gladstone Lighter previous note)  61 year old male here for evaluation of headaches.  Patient has had headaches for the past 6 to 8 months with occipital and left parietal throbbing severe pain lasting hours at a time.  Sometimes associated with nausea.  No sensitivity to light or sound.  Sometimes he feels some blurred vision and spots with headaches.  No prior similar headaches.  Patient went to the emergency room in November 2021 for evaluation and CT and CTA of head and neck which were unremarkable.  He was tried on sumatriptan without relief.   Also has history of anxiety and insomnia, alcohol abuse, alcoholic pancreatitis.  He has been drinking about 6 beers per week but lately has been drinking up to 12 beers or more per week.   REVIEW OF SYSTEMS: Out of a complete 14 system review of symptoms, the patient complains only of the following symptoms, and all other reviewed systems are negative.   ALLERGIES: Allergies  Allergen Reactions   Hydrocodone-Acetaminophen Palpitations   Shrimp [Shellfish Allergy] Other (See Comments)    Cholesterol level becomes very high. Patient reports sweating and itching.     HOME MEDICATIONS: Outpatient Medications Prior to Visit  Medication Sig Dispense Refill   acetaminophen (TYLENOL) 500 MG tablet Take 2 tablets (1,000 mg total) by mouth every 6 (six) hours as needed. 30 tablet 0   amLODipine (NORVASC) 10 MG tablet Take 1 tablet by mouth once daily 90 tablet 0   atorvastatin (LIPITOR) 40 MG tablet Take 1 tablet by mouth once daily 90 tablet 3   benzonatate (TESSALON)  100 MG capsule Take 1 capsule (100 mg total) by mouth every 8 (eight) hours. 21 capsule 0   carvedilol (COREG) 25 MG tablet Take 1 tablet (25 mg total) by mouth 2 (two) times daily with a meal. 180 tablet 0   chlorhexidine (PERIDEX) 0.12 % solution Use as directed 15 mLs in the mouth or throat 2 (two) times daily. 120 mL 0   cyclobenzaprine (FLEXERIL) 10 MG tablet Take 1 tablet by mouth three times daily as needed for muscle spasm 60 tablet 1   doxycycline (VIBRAMYCIN) 100 MG capsule Take 1 capsule (100 mg total) by mouth 2 (two) times daily. 20 capsule 0   FIBER PO Take 1 capsule by mouth daily.     losartan (COZAAR) 100 MG tablet Take 1 tablet (100 mg total) by mouth daily. 90 tablet 1   Multiple Vitamins-Minerals (MULTIVITAMIN WITH MINERALS) tablet Take 1 tablet by mouth daily.     naproxen (NAPROSYN) 500 MG tablet Take 1 tablet (500 mg total) by mouth 2 (two) times daily. 30 tablet 0   omeprazole (PRILOSEC) 20 MG capsule Take 1 capsule by mouth once daily 30 capsule 0   potassium chloride SA (KLOR-CON) 20 MEQ tablet Take 2 tablets (40 mEq total) by mouth 2 (two) times daily for 1 day. 4 tablet 0   promethazine-dextromethorphan (PROMETHAZINE-DM) 6.25-15 MG/5ML syrup Take 5 mLs by mouth 4 (four) times daily as needed for cough. 118 mL 0   Rimegepant Sulfate (NURTEC) 75 MG  TBDP Take 75 mg by mouth daily as needed. 8 tablet 6   rizatriptan (MAXALT-MLT) 10 MG disintegrating tablet Take 1 tablet (10 mg total) by mouth as needed for migraine. May repeat in 2 hours if needed 9 tablet 11   Tetrahydrozoline HCl (VISINE OP) Place 1 drop into both eyes daily as needed (redness).     traZODone (DESYREL) 100 MG tablet TAKE 1 TABLET (100 MG TOTAL) BY MOUTH AT BEDTIME. (Patient not taking: Reported on 02/28/2021) 90 tablet 1   triamcinolone cream (KENALOG) 0.1 % Apply 1 application topically 2 (two) times daily. 60 g 0   No facility-administered medications prior to visit.     PAST MEDICAL HISTORY: Past  Medical History:  Diagnosis Date   Alcohol abuse    Anxiety    Arthritis    Diverticulosis of colon (without mention of hemorrhage)    GERD (gastroesophageal reflux disease)    History of kidney stones    Hyperlipemia    Hypertension    dr Ronalee Belts  norins   Kidney stones    Nephrolithiasis    Pancreatitis    Hx of    Ureteral stent displacement ([place]) 1990     PAST SURGICAL HISTORY: Past Surgical History:  Procedure Laterality Date   A2 pulley flexor sheath cyst 4th finger excisional biopsy     '03   ANTERIOR CRUCIATE LIGAMENT REPAIR     R knee   ESOPHAGOGASTRODUODENOSCOPY N/A 07/09/2012   Procedure: ESOPHAGOGASTRODUODENOSCOPY (EGD);  Surgeon: Inda Castle, MD;  Location: [facility];  Service: Endoscopy;  Laterality: N/A;   KNEE SURGERY  2008   LEFT HEART CATH AND CORONARY ANGIOGRAPHY N/A 08/10/2019   Procedure: LEFT HEART CATH AND CORONARY ANGIOGRAPHY;  Surgeon: Lorretta Harp, MD;  Location: [facility];  Service: Cardiovascular;  Laterality: N/A;   SHOULDER ARTHROSCOPY WITH SUBACROMIAL DECOMPRESSION AND OPEN ROTATOR C Right 12/26/2012   Procedure: RIGHT SHOULDER ARTHROSCOPY WITH SUBACROMIAL DECOMPRESSION MINI OPEN ROTATOR CUFF REPAIR, OPEN BICEP TENODESIS OPEN DISTAL CLAVICLE RESECTION  ;  Surgeon: Augustin Schooling, MD;  Location: [facility];  Service: Orthopedics;  Laterality: Right;   TRANSFORAMINAL LUMBAR INTERBODY FUSION (TLIF) WITH PEDICLE SCREW FIXATION 1 LEVEL Right 03/27/2018   Procedure: RIGHT-SIDED LUMBAR 4-5 TRANSFORAMINAL LUMBAR INTERBODY FUSION WITH INSTRUMENTATION AND ALLOGRAFT;  Surgeon: Phylliss Bob, MD;  Location: [facility];  Service: Orthopedics;  Laterality: Right;   URETEROSCOPY     and stone retreval     FAMILY HISTORY: Family History  Problem Relation Age of Onset   Hypertension Father        X4 siblings   Heart disease Father    Diabetes Mother        x2 brothers     SOCIAL HISTORY: Social History   Socioeconomic History   Marital status:  Legally Separated    Spouse name: Not on file   Number of children: 3   Years of education: Not on file   Highest education level: Not on file  Occupational History    Employer: [employer]    Comment: Estate agent , retired  Tobacco Use   Smoking status: Former    Types: Cigarettes    Quit date: 07/10/1998    Years since quitting: 23.1   Smokeless tobacco: Never  Vaping Use   Vaping Use: Never used  Substance and Sexual Activity   Alcohol use: Yes    Alcohol/week: 6.0 standard drinks    Types: 6 Cans of beer per  week    Comment: 1 6 pack a week   Drug use: No   Sexual activity: Yes  Other Topics Concern   Not on file  Social History Narrative   HSG   [employer] groundskeeper   Difficult domestic situation, Married '87- separated '01   2 sons- '82, '91; 1 daughter '91   His niece is 32 with metastatic breast cancer (feb '12)   Social Determinants of Health   Financial Resource Strain: Not on file  Food Insecurity: Not on file  Transportation Needs: Not on file  Physical Activity: Not on file  Stress: Not on file  Social Connections: Not on file  Intimate Partner Violence: Not on file     PHYSICAL EXAM  There were no vitals filed for this visit. There is no height or weight on file to calculate BMI.  Generalized: Well developed, in no acute distress  Cardiology: normal rate and rhythm, no murmur auscultated  Respiratory: clear to auscultation bilaterally    Neurological examination  Mentation: Alert oriented to time, place, history taking. Follows all commands speech and language fluent Cranial nerve II-XII: Pupils were equal round reactive to light. Extraocular movements were full, visual field were full on confrontational test. Facial sensation and strength were normal. Uvula tongue midline. Head turning and shoulder shrug  were normal and symmetric. Motor: The motor testing reveals 5 over 5 strength of all 4 extremities. Good symmetric motor tone is noted  throughout.  Sensory: Sensory testing is intact to soft touch on all 4 extremities. No evidence of extinction is noted.  Coordination: Cerebellar testing reveals good finger-nose-finger and heel-to-shin bilaterally.  Gait and station: Gait is normal. Tandem gait is normal. Romberg is negative. No drift is seen.  Reflexes: Deep tendon reflexes are symmetric and normal bilaterally.    DIAGNOSTIC DATA (LABS, IMAGING, TESTING) - I reviewed patient records, labs, notes, testing and imaging myself where available.  Lab Results  Component Value Date   WBC 9.1 08/23/2021   HGB 11.0 (L) 08/23/2021   HCT 33.4 (L) 08/23/2021   MCV 96.3 08/23/2021   PLT 195 08/23/2021      Component Value Date/Time   NA 140 08/23/2021 1237   NA 138 06/17/2020 2020   K 3.4 (L) 08/23/2021 1237   CL 106 08/23/2021 1237   CO2 27 08/23/2021 1237   GLUCOSE 107 (H) 08/23/2021 1237   BUN 9 08/23/2021 1237   BUN 11 06/17/2020 2020   CREATININE 1.08 08/23/2021 1237   CALCIUM 8.8 (L) 08/23/2021 1237   PROT 7.1 02/28/2021 0928   ALBUMIN 4.6 02/28/2021 0928   AST 25 02/28/2021 0928   ALT 27 02/28/2021 0928   ALKPHOS 43 (L) 02/28/2021 0928   BILITOT 0.7 02/28/2021 0928   GFRNONAA >60 08/23/2021 1237   GFRAA 67 06/17/2020 2020   Lab Results  Component Value Date   CHOL 137 02/28/2021   HDL 67 02/28/2021   LDLCALC 47 02/28/2021   LDLDIRECT 107.5 01/24/2012   TRIG 134 02/28/2021   CHOLHDL 2.0 02/28/2021   Lab Results  Component Value Date   HGBA1C 5.3 09/08/2019   No results found for: [id number] Lab Results  Component Value Date   TSH 1.173 04/03/2020        View : No data to display.               View : No data to display.           ASSESSMENT AND PLAN  60  y.o. year old male  has a past medical history of Alcohol abuse, Anxiety, Arthritis, Diverticulosis of colon (without mention of hemorrhage), GERD (gastroesophageal reflux disease), History of kidney stones, Hyperlipemia,  Hypertension, Kidney stones, Nephrolithiasis, Pancreatitis, and Ureteral stent displacement ([place]) (1990). here with    No diagnosis found.  Denny Peon ***.  Healthy lifestyle habits encouraged. *** will follow up with PCP as directed. *** will return to see me in ***, sooner if needed. *** verbalizes understanding and agreement with this plan.   No orders of the defined types were placed in this encounter.    No orders of the defined types were placed in this encounter.    Debbora Presto, MSN, FNP-C 09/13/2021, 4:26 PM  [facility] [address] [place], [place] [address] [phone]

## 2021-09-13 NOTE — Telephone Encounter (Signed)
? ? ? ?  Chief Complaint: Continued rectal bleeding. Seen in ED in April ?Symptoms: Bleeding ?Frequency: Last month ?Pertinent Negatives: Patient denies chest pain ?Disposition: '[x]'$ ED /'[]'$ Urgent Care (no appt availability in office) / '[]'$ Appointment(In office/virtual)/ '[]'$  [facility]/ '[]'$ Home Care/ '[]'$ Refused Recommended Disposition /'[]'$ [facility]/ '[]'$  Follow-up with PCP ?Additional Notes: Will have someone drive him.  ?Reason for Disposition ?? Patient sounds very sick or weak to the triager ? ?Answer Assessment - Initial Assessment Questions ?1. APPEARANCE of BLOOD: "What color is it?" "Is it passed separately, on the surface of the stool, or mixed in with the stool?"  ?    Dark and bright red, in water ?2. AMOUNT: "How much blood was passed?"  ?    Large amount ?3. FREQUENCY: "How many times has blood been passed with the stools?"  ?    2 x today ?4. ONSET: "When was the blood first seen in the stools?" (Days or weeks)  ?    April ?5. DIARRHEA: "Is there also some diarrhea?" If Yes, ask: "How many diarrhea stools in the past 24 hours?"  ?    No ?6. CONSTIPATION: "Do you have constipation?" If Yes, ask: "How bad is it?" ?    No ?7. RECURRENT SYMPTOMS: "Have you had blood in your stools before?" If Yes, ask: "When was the last time?" and "What happened that time?"  ?    No ?8. BLOOD THINNERS: "Do you take any blood thinners?" (e.g., Coumadin/warfarin, Pradaxa/dabigatran, aspirin) ?    No ?9. OTHER SYMPTOMS: "Do you have any other symptoms?"  (e.g., abdomen pain, vomiting, dizziness, fever) ?    Mild abdominal pain, feels weak ?10. PREGNANCY: "Is there any chance you are pregnant?" "When was your last menstrual period?" ?      N/a ? ?Protocols used: Rectal Bleeding-A-AH ? ?

## 2021-09-13 NOTE — Patient Instructions (Incomplete)

## 2021-09-14 ENCOUNTER — Other Ambulatory Visit (INDEPENDENT_AMBULATORY_CARE_PROVIDER_SITE_OTHER): Payer: Self-pay | Admitting: Primary Care

## 2021-09-14 ENCOUNTER — Ambulatory Visit: Payer: Self-pay | Admitting: Family Medicine

## 2021-09-14 ENCOUNTER — Encounter (HOSPITAL_COMMUNITY): Payer: Self-pay

## 2021-09-14 ENCOUNTER — Encounter: Payer: Self-pay | Admitting: Family Medicine

## 2021-09-14 ENCOUNTER — Emergency Department (HOSPITAL_COMMUNITY)
Admission: EM | Admit: 2021-09-14 | Discharge: 2021-09-14 | Disposition: A | Discharge status: Home / Self Care | Payer: 59 | Attending: Emergency Medicine | Admitting: Emergency Medicine

## 2021-09-14 DIAGNOSIS — Z79899 Other long term (current) drug therapy: Secondary | ICD-10-CM | POA: Insufficient documentation

## 2021-09-14 DIAGNOSIS — D649 Anemia, unspecified: Secondary | ICD-10-CM | POA: Diagnosis not present

## 2021-09-14 DIAGNOSIS — I10 Essential (primary) hypertension: Secondary | ICD-10-CM | POA: Diagnosis not present

## 2021-09-14 DIAGNOSIS — E876 Hypokalemia: Secondary | ICD-10-CM | POA: Diagnosis not present

## 2021-09-14 DIAGNOSIS — G43109 Migraine with aura, not intractable, without status migrainosus: Secondary | ICD-10-CM

## 2021-09-14 DIAGNOSIS — R109 Unspecified abdominal pain: Secondary | ICD-10-CM | POA: Diagnosis present

## 2021-09-14 DIAGNOSIS — R0789 Other chest pain: Secondary | ICD-10-CM | POA: Diagnosis not present

## 2021-09-14 DIAGNOSIS — K921 Melena: Secondary | ICD-10-CM | POA: Insufficient documentation

## 2021-09-14 DIAGNOSIS — R1084 Generalized abdominal pain: Secondary | ICD-10-CM

## 2021-09-14 LAB — TYPE AND SCREEN
ABO/RH(D): AB POS
Antibody Screen: NEGATIVE

## 2021-09-14 LAB — COMPREHENSIVE METABOLIC PANEL
ALT: 41 U/L (ref 0–44)
AST: 36 U/L (ref 15–41)
Albumin: 4.2 g/dL (ref 3.5–5.0)
Alkaline Phosphatase: 41 U/L (ref 38–126)
Anion gap: 10 (ref 5–15)
BUN: 12 mg/dL (ref 6–20)
CO2: 24 mmol/L (ref 22–32)
Calcium: 7.4 mg/dL — ABNORMAL LOW (ref 8.9–10.3)
Chloride: 107 mmol/L (ref 98–111)
Creatinine, Ser: 1.47 mg/dL — ABNORMAL HIGH (ref 0.61–1.24)
GFR, Estimated: 55 mL/min — ABNORMAL LOW (ref >60)
Glucose, Bld: 139 mg/dL — ABNORMAL HIGH (ref 70–99)
Potassium: 2.9 mmol/L — ABNORMAL LOW (ref 3.5–5.1)
Sodium: 141 mmol/L (ref 135–145)
Total Bilirubin: 1.3 mg/dL — ABNORMAL HIGH (ref 0.3–1.2)
Total Protein: 7.6 g/dL (ref 6.5–8.1)

## 2021-09-14 LAB — URINALYSIS, ROUTINE W REFLEX MICROSCOPIC
Bilirubin Urine: NEGATIVE
Glucose, UA: NEGATIVE mg/dL
Ketones, ur: NEGATIVE mg/dL
Nitrite: NEGATIVE
Protein, ur: 30 mg/dL — AB
Specific Gravity, Urine: 1.021 (ref 1.005–1.030)
pH: 5 (ref 5.0–8.0)

## 2021-09-14 LAB — CBC WITH DIFFERENTIAL/PLATELET
Abs Immature Granulocytes: 0.11 10*3/uL — ABNORMAL HIGH (ref 0.00–0.07)
Basophils Absolute: 0.1 10*3/uL (ref 0.0–0.1)
Basophils Relative: 1 %
Eosinophils Absolute: 0.1 10*3/uL (ref 0.0–0.5)
Eosinophils Relative: 1 %
HCT: 36.7 % — ABNORMAL LOW (ref 39.0–52.0)
Hemoglobin: 12.4 g/dL — ABNORMAL LOW (ref 13.0–17.0)
Immature Granulocytes: 1 %
Lymphocytes Relative: 27 %
Lymphs Abs: 2.6 10*3/uL (ref 0.7–4.0)
MCH: 31.8 pg (ref 26.0–34.0)
MCHC: 33.8 g/dL (ref 30.0–36.0)
MCV: 94.1 fL (ref 80.0–100.0)
Monocytes Absolute: 0.8 10*3/uL (ref 0.1–1.0)
Monocytes Relative: 8 %
Neutro Abs: 6 10*3/uL (ref 1.7–7.7)
Neutrophils Relative %: 62 %
Platelets: 202 10*3/uL (ref 150–400)
RBC: 3.9 MIL/uL — ABNORMAL LOW (ref 4.22–5.81)
RDW: 13.4 % (ref 11.5–15.5)
WBC: 9.7 10*3/uL (ref 4.0–10.5)
nRBC: 0 % (ref 0.0–0.2)

## 2021-09-14 LAB — LIPASE, BLOOD: Lipase: 40 U/L (ref 11–51)

## 2021-09-14 LAB — TROPONIN I (HIGH SENSITIVITY)
Troponin I (High Sensitivity): 8 ng/L (ref <18)
Troponin I (High Sensitivity): 8 ng/L (ref <18)

## 2021-09-14 MED ORDER — SUCRALFATE 1 GM/10ML PO SUSP
1.0000 g | Freq: Once | ORAL | Status: AC
  Administered 2021-09-14: 1 g via ORAL
  Filled 2021-09-14: qty 10

## 2021-09-14 MED ORDER — POTASSIUM CHLORIDE CRYS ER 20 MEQ PO TBCR: 40.0000 meq | EXTENDED_RELEASE_TABLET | Freq: Once | ORAL | Status: DC

## 2021-09-14 MED ORDER — POTASSIUM CHLORIDE IN NACL 20-0.9 MEQ/L-% IV SOLN
Freq: Once | INTRAVENOUS | Status: DC
  Filled 2021-09-14: qty 1000

## 2021-09-14 MED ORDER — SUCRALFATE 1 G PO TABS: 1.0000 g | ORAL_TABLET | Freq: Three times a day (TID) | ORAL | 0 refills | Status: DC

## 2021-09-14 NOTE — ED Notes (Signed)
During orthostatic signs, notice that pt had a high blood pressure. Pt states they do have high blood pressure and do take medication for it. Last known time taking the medication was this morning around 8:30 am - 9:00 am. Asked the pt how they felt during the orthostatic vs, pt states " I felt fine, I am good right now".  ?

## 2021-09-14 NOTE — ED Triage Notes (Addendum)
Patient reports that he has had a mixture of blood in his stool and when he doesn't see blood the stool is "black dark" x 2 weeks. ?Patient also c/o generalized abdominal pain, lower back pain , nausea, and weakness x 2-3 days. ?Patient reports that he began having intermittent chest pain last night and no SOB noted. ? ? ?Patient added that he went to several Birthday parties this week and had been drinking heavily and since drinking he has been seeing the blood. ?

## 2021-09-14 NOTE — ED Provider Notes (Signed)
?[facility] DEPT ?Provider Note ? ? ?CSN: [id number] ?Arrival date & time: 09/14/21  1456 ? ?  ? ?History ? ?Chief Complaint  ?Patient presents with  ? Chest Pain  ? Abdominal Pain  ? Blood In Stools  ? Back Pain  ? ? ?[name] is a 61 y.o. male. ? ?HPI ?Patient presents with concern of intermittent chest pain, abdominal pain, with ongoing black stool, red stool.  Patient acknowledges drinking alcohol, states that he drank a substantial amount about 2 weeks ago.  That was roughly the time of onset of this illness.  Now, over the past 2 weeks he has had intermittent symptoms as above, without clear alleviating, exacerbating or precipitating factors.  No syncope, no persistent chest pain, no dyspnea. ?  ? ?Home Medications ?Prior to Admission medications   ?Medication Sig Start Date End Date Taking? Authorizing Provider  ?sucralfate (CARAFATE) 1 g tablet Take 1 tablet (1 g total) by mouth 4 (four) times daily -  with meals and at bedtime. 09/14/21  Yes Carmin Muskrat, MD  ?acetaminophen (TYLENOL) 500 MG tablet Take 2 tablets (1,000 mg total) by mouth every 6 (six) hours as needed. 04/04/20   Charlesetta Shanks, MD  ?amLODipine (NORVASC) 10 MG tablet Take 1 tablet by mouth once daily 06/26/21   [name], NP  ?atorvastatin (LIPITOR) 40 MG tablet Take 1 tablet by mouth once daily 06/26/21   Lorretta Harp, MD  ?benzonatate (TESSALON) 100 MG capsule Take 1 capsule (100 mg total) by mouth every 8 (eight) hours. 06/20/21   Hans Eden, NP  ?carvedilol (COREG) 25 MG tablet Take 1 tablet (25 mg total) by mouth 2 (two) times daily with a meal. 01/04/21   [name], NP  ?chlorhexidine (PERIDEX) 0.12 % solution Use as directed 15 mLs in the mouth or throat 2 (two) times daily. 04/05/21   [name], NP  ?cyclobenzaprine (FLEXERIL) 10 MG tablet Take 1 tablet by mouth three times daily as needed for muscle spasm 08/09/21   [name], NP  ?doxycycline  (VIBRAMYCIN) 100 MG capsule Take 1 capsule (100 mg total) by mouth 2 (two) times daily. 08/17/21   Teodora Medici, FNP  ?FIBER PO Take 1 capsule by mouth daily.    [provider]  ?losartan (COZAAR) 100 MG tablet Take 1 tablet (100 mg total) by mouth daily. 10/10/20   [name], NP  ?Multiple Vitamins-Minerals (MULTIVITAMIN WITH MINERALS) tablet Take 1 tablet by mouth daily.    [provider]  ?naproxen (NAPROSYN) 500 MG tablet Take 1 tablet (500 mg total) by mouth 2 (two) times daily. 06/20/21   Hans Eden, NP  ?omeprazole (PRILOSEC) 20 MG capsule Take 1 capsule by mouth once daily 08/28/21   [name], NP  ?potassium chloride SA (KLOR-CON) 20 MEQ tablet Take 2 tablets (40 mEq total) by mouth 2 (two) times daily for 1 day. 01/12/21 01/13/21  Kommor, Madison, MD  ?promethazine-dextromethorphan (PROMETHAZINE-DM) 6.25-15 MG/5ML syrup Take 5 mLs by mouth 4 (four) times daily as needed for cough. 06/20/21   Hans Eden, NP  ?Rimegepant Sulfate (NURTEC) 75 MG TBDP Take 75 mg by mouth daily as needed. 09/19/20   Penumalli, Earlean Polka, MD  ?rizatriptan (MAXALT-MLT) 10 MG disintegrating tablet Take 1 tablet (10 mg total) by mouth as needed for migraine. May repeat in 2 hours if needed 09/19/20   Penumalli, Earlean Polka, MD  ?Tetrahydrozoline HCl (VISINE OP) Place 1 drop  into both eyes daily as needed (redness).    [provider]  ?traZODone (DESYREL) 100 MG tablet TAKE 1 TABLET (100 MG TOTAL) BY MOUTH AT BEDTIME. ?Patient not taking: Reported on 02/28/2021 01/04/21 01/04/22  [name], NP  ?triamcinolone cream (KENALOG) 0.1 % Apply 1 application topically 2 (two) times daily. 09/08/19   Scot Jun, FNP  ?diphenhydrAMINE (BENADRYL) 25 MG tablet Take 1 tablet (25 mg total) by mouth every 6 (six) hours as needed. 04/04/20 06/17/20  Charlesetta Shanks, MD  ?metoCLOPramide (REGLAN) 10 MG tablet Take 1 tablet (10 mg total) by mouth every 6 (six) hours as needed for nausea  (nausea/headache). Take this tablet with 25 mg of Benadryl and two extra strength Tylenol for headache. 04/27/20 06/17/20  [name], NP  ?   ? ?Allergies    ?Hydrocodone-acetaminophen and Shrimp [shellfish allergy]   ? ?Review of Systems   ?Review of Systems  ?Constitutional:   ?     Per HPI, otherwise negative  ?HENT:    ?     Per HPI, otherwise negative  ?Respiratory:    ?     Per HPI, otherwise negative  ?Cardiovascular:   ?     Per HPI, otherwise negative  ?Gastrointestinal:  Negative for vomiting.  ?Endocrine:  ?     Negative aside from HPI  ?Genitourinary:   ?     Neg aside from HPI   ?Musculoskeletal:   ?     Per HPI, otherwise negative  ?Skin: Negative.   ?Neurological:  Negative for syncope.  ? ?Physical Exam ?Updated Vital Signs ?BP (!) 163/102   Pulse 74   Temp 99.2 ?F (37.3 ?C)   Resp (!) 21   Ht 6' (1.829 m)   Wt 117 kg   SpO2 99%   BMI 34.99 kg/m?  ?Physical Exam ?Vitals and nursing note reviewed.  ?Constitutional:   ?   General: He is not in acute distress. ?   Appearance: He is well-developed.  ?HENT:  ?   Head: Normocephalic and atraumatic.  ?Eyes:  ?   Conjunctiva/sclera: Conjunctivae normal.  ?Cardiovascular:  ?   Rate and Rhythm: Normal rate and regular rhythm.  ?Pulmonary:  ?   Effort: Pulmonary effort is normal. No respiratory distress.  ?   Breath sounds: No stridor.  ?Abdominal:  ?   General: There is no distension.  ?   Tenderness: There is no abdominal tenderness. There is no guarding or rebound.  ?Skin: ?   General: Skin is warm and dry.  ?Neurological:  ?   Mental Status: He is alert and oriented to person, place, and time.  ? ? ?ED Results / Procedures / Treatments   ?Labs ?(all labs ordered are listed, but only abnormal results are displayed) ?Labs Reviewed  ?COMPREHENSIVE METABOLIC PANEL - Abnormal; Notable for the following components:  ?    Result Value  ? Potassium 2.9 (*)   ? Glucose, Bld 139 (*)   ? Creatinine, Ser 1.47 (*)   ? Calcium 7.4 (*)   ? Total Bilirubin  1.3 (*)   ? GFR, Estimated 55 (*)   ? All other components within normal limits  ?CBC WITH DIFFERENTIAL/PLATELET - Abnormal; Notable for the following components:  ? RBC 3.90 (*)   ? Hemoglobin 12.4 (*)   ? HCT 36.7 (*)   ? Abs Immature Granulocytes 0.11 (*)   ? All other components within normal limits  ?URINALYSIS, ROUTINE W REFLEX MICROSCOPIC - Abnormal;  Notable for the following components:  ? Color, Urine AMBER (*)   ? APPearance HAZY (*)   ? Hgb urine dipstick SMALL (*)   ? Protein, ur 30 (*)   ? Leukocytes,Ua TRACE (*)   ? Bacteria, UA RARE (*)   ? All other components within normal limits  ?LIPASE, BLOOD  ?TYPE AND SCREEN  ?TROPONIN I (HIGH SENSITIVITY)  ?TROPONIN I (HIGH SENSITIVITY)  ? ? ?EKG ?EKG Interpretation ? ?Date/Time:  Thursday Sep 14 2021 15:08:33 EDT ?Ventricular Rate:  92 ?PR Interval:  160 ?QRS Duration: 90 ?QT Interval:  390 ?QTC Calculation: 482 ?R Axis:   -11 ?Text Interpretation: Normal sinus rhythm Minimal voltage criteria for LVH, may be normal variant ( R in aVL ) Nonspecific T wave abnormality Prolonged QT Abnormal ECG Confirmed by Carmin Muskrat [phone]) on 09/14/2021 5:55:55 PM ? ?Radiology ?No results found. ? ?Procedures ?Procedures  ? ? ?Medications Ordered in ED ?Medications  ?sucralfate (CARAFATE) 1 GM/10ML suspension 1 g (has no administration in time range)  ?potassium chloride SA (KLOR-CON M) CR tablet 40 mEq (has no administration in time range)  ? ? ?ED Course/ Medical Decision Making/ A&P ? ?This patient with a Hx of alcohol use, hypertension presents to the ED for concern of chest pain, abdominal pain, bloody stool, this involves an extensive number of treatment options, and is a complaint that carries with it a high risk of complications and morbidity.   ? ?The differential diagnosis includes diverticulosis, alcoholic gastritis, peptic ulcer disease, anemia, infection ? ? ?Social Determinants of Health: ? ?Alcohol use ? ?Additional history obtained: ? ?Additional history  and/or information obtained from chart review, notable for urgent care evaluation 1 month ago for rectal bleeding with hemoglobin value 11 ? ? ?After the initial evaluation, orders, including: Labs, type and screen, flu

## 2021-09-14 NOTE — ED Notes (Signed)
An After Visit Summary was printed and given to the patient. Discharge instructions given and no further questions at this time.  

## 2021-09-14 NOTE — Discharge Instructions (Addendum)
As we discussed that is important that you minimize alcohol intake, take medication as directed and follow-up with your gastroenterologist for consideration of endoscopy versus colonoscopy, given both abdominal pain, and alcohol intake concerning for gastritis as well as occasional blood in your stool.  In the interim, monitor your condition carefully and do not hesitate to return here for concerning changes in your condition. ? ?In addition, please focus on staying well-hydrated. ?

## 2021-09-14 NOTE — Telephone Encounter (Signed)
Routed to PCP 

## 2021-09-14 NOTE — ED Provider Triage Note (Signed)
Emergency Medicine Provider Triage Evaluation Note ? ?[name] , a 61 y.o. male  was evaluated in triage.  Pt complains of abdominal pain and blood in his stool.  He states that this has been intermittently happening since 2 weeks ago.  He states that he has had bright red blood and black stools.  He endorses lower abdominal pain and nausea.  He denies any vomiting.  He denies any dysuria or hematuria.  Not anticoagulated.  Also notes that he had mild chest pain last night no shortness of breath.. ? ?Review of Systems  ?Positive: See above ?Negative:  ? ?Physical Exam  ?BP 138/89 (BP Location: Left Arm)   Pulse 92   Temp 99.2 ?F (37.3 ?C)   Resp 18   Ht 6' (1.829 m)   Wt 117 kg   SpO2 100%   BMI 34.99 kg/m?  ?Gen:   Awake, no distress   ?Resp:  Normal effort  ?MSK:   Moves extremities without difficulty  ?Other:  Abdomen is rounded, soft, mild tenderness to palpation of the periumbilical region.  Bowel sounds present. ? ?Medical Decision Making  ?Medically screening exam initiated at 3:26 PM.  Appropriate orders placed.  [name] was informed that the remainder of the evaluation will be completed by another provider, this initial triage assessment does not replace that evaluation, and the importance of remaining in the ED until their evaluation is complete. ? ? ?  ?Mickie Hillier, PA-C ?09/14/21 1527 ? ?

## 2021-09-15 ENCOUNTER — Other Ambulatory Visit (INDEPENDENT_AMBULATORY_CARE_PROVIDER_SITE_OTHER): Payer: Self-pay | Admitting: Primary Care

## 2021-09-15 DIAGNOSIS — I10 Essential (primary) hypertension: Secondary | ICD-10-CM

## 2021-09-15 DIAGNOSIS — Z760 Encounter for issue of repeat prescription: Secondary | ICD-10-CM

## 2021-09-15 NOTE — Telephone Encounter (Signed)
Sent to PCP ?

## 2021-09-19 ENCOUNTER — Ambulatory Visit (HOSPITAL_COMMUNITY)
Admission: EM | Admit: 2021-09-19 | Discharge: 2021-09-19 | Disposition: A | Discharge status: Home / Self Care | Payer: 59 | Attending: Internal Medicine | Admitting: Internal Medicine

## 2021-09-19 ENCOUNTER — Ambulatory Visit (INDEPENDENT_AMBULATORY_CARE_PROVIDER_SITE_OTHER): Payer: 59

## 2021-09-19 ENCOUNTER — Other Ambulatory Visit: Payer: Self-pay

## 2021-09-19 ENCOUNTER — Encounter (HOSPITAL_COMMUNITY): Payer: Self-pay | Admitting: *Deleted

## 2021-09-19 DIAGNOSIS — M25571 Pain in right ankle and joints of right foot: Secondary | ICD-10-CM

## 2021-09-19 DIAGNOSIS — M79671 Pain in right foot: Secondary | ICD-10-CM | POA: Diagnosis not present

## 2021-09-19 MED ORDER — PREDNISONE 20 MG PO TABS: 40.0000 mg | ORAL_TABLET | Freq: Every day | ORAL | 0 refills | Status: AC

## 2021-09-19 NOTE — ED Provider Notes (Signed)
?[facility] ? ? ? ?CSN: [id number] ?Arrival date & time: 09/19/21  1437 ? ? ?  ? ?History   ?Chief Complaint ?Chief Complaint  ?Patient presents with  ? Ankle Pain  ?  Rt  ? ? ?HPI ?[name] is a 61 y.o. male.  ? ?Patient presents with right ankle and right foot pain that started approximately 3 days ago.  Denies any apparent injury to the area.  Patient has taken Tylenol for pain with minimal improvement.  Denies any numbness or tingling.  Patient having difficulty bearing weight and is walking with a cane.  Denies history of chronic pain, arthritis, gout.  Patient denies that he has a large amount of red meat or shellfish.  Patient does report that he drinks alcohol but has not drank any alcohol in 1 week. ? ? ?Ankle Pain ? ?Past Medical History:  ?Diagnosis Date  ? Alcohol abuse   ? Anxiety   ? Arthritis   ? Diverticulosis of colon (without mention of hemorrhage)   ? GERD (gastroesophageal reflux disease)   ? History of kidney stones   ? Hyperlipemia   ? Hypertension   ? dr Ronalee Belts  norins  ? Kidney stones   ? Nephrolithiasis   ? Pancreatitis   ? Hx of   ? Ureteral stent displacement ([place]) 1990  ? ? ?Patient Active Problem List  ? Diagnosis Date Noted  ? Chest pain of uncertain etiology 25/85/2778  ? Alcoholic pancreatitis 24/23/5361  ? Normal anion gap metabolic acidosis 44/31/5400  ? Radiculopathy 03/27/2018  ? 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/02/2017  ? Renal stone 10/08/2016  ? Non-compliant behavior 12/02/2013  ? Routine health maintenance 01/27/2012  ? Hyperlipidemia 01/27/2012  ? Knee pain, left 11/12/2011  ? BPH (benign prostatic hyperplasia) 11/12/2011  ? GERD 08/10/2008  ? Essential hypertension 04/14/2008  ? Alcohol abuse 10/26/2007  ? TRIGGER FINGER, LEFT MIDDLE 05/12/2007  ? NEPHROLITHIASIS, HX OF 05/12/2007  ? ? ?Past Surgical History:  ?Procedure Laterality Date  ? A2 pulley flexor sheath cyst 4th finger excisional biopsy    ? '03  ? ANTERIOR CRUCIATE LIGAMENT REPAIR    ? R knee  ?  ESOPHAGOGASTRODUODENOSCOPY N/A 07/09/2012  ? Procedure: ESOPHAGOGASTRODUODENOSCOPY (EGD);  Surgeon: Inda Castle, MD;  Location: [facility];  Service: Endoscopy;  Laterality: N/A;  ? KNEE SURGERY  2008  ? LEFT HEART CATH AND CORONARY ANGIOGRAPHY N/A 08/10/2019  ? Procedure: LEFT HEART CATH AND CORONARY ANGIOGRAPHY;  Surgeon: Lorretta Harp, MD;  Location: [facility];  Service: Cardiovascular;  Laterality: N/A;  ? SHOULDER ARTHROSCOPY WITH SUBACROMIAL DECOMPRESSION AND OPEN ROTATOR C Right 12/26/2012  ? Procedure: RIGHT SHOULDER ARTHROSCOPY WITH SUBACROMIAL DECOMPRESSION MINI OPEN ROTATOR CUFF REPAIR, OPEN BICEP TENODESIS OPEN DISTAL CLAVICLE RESECTION  ;  Surgeon: Augustin Schooling, MD;  Location: [facility];  Service: Orthopedics;  Laterality: Right;  ? TRANSFORAMINAL LUMBAR INTERBODY FUSION (TLIF) WITH PEDICLE SCREW FIXATION 1 LEVEL Right 03/27/2018  ? Procedure: RIGHT-SIDED LUMBAR 4-5 TRANSFORAMINAL LUMBAR INTERBODY FUSION WITH INSTRUMENTATION AND ALLOGRAFT;  Surgeon: Phylliss Bob, MD;  Location: [facility];  Service: Orthopedics;  Laterality: Right;  ? URETEROSCOPY    ? and stone retreval  ? ? ? ? ? ?Home Medications   ? ?Prior to Admission medications   ?Medication Sig Start Date End Date Taking? Authorizing Provider  ?predniSONE (DELTASONE) 20 MG tablet Take 2 tablets (40 mg total) by mouth daily for 5 days. 09/19/21 09/24/21 Yes Teodora Medici, FNP  ?  acetaminophen (TYLENOL) 500 MG tablet Take 2 tablets (1,000 mg total) by mouth every 6 (six) hours as needed. 04/04/20   Charlesetta Shanks, MD  ?amLODipine (NORVASC) 10 MG tablet Take 1 tablet by mouth once daily 06/26/21   [name], NP  ?atorvastatin (LIPITOR) 40 MG tablet Take 1 tablet by mouth once daily 06/26/21   Lorretta Harp, MD  ?benzonatate (TESSALON) 100 MG capsule Take 1 capsule (100 mg total) by mouth every 8 (eight) hours. 06/20/21   Hans Eden, NP  ?carvedilol (COREG) 25 MG tablet Take 1 tablet (25 mg total) by mouth 2 (two) times  daily with a meal. 01/04/21   [name], NP  ?chlorhexidine (PERIDEX) 0.12 % solution Use as directed 15 mLs in the mouth or throat 2 (two) times daily. 04/05/21   [name], NP  ?cyclobenzaprine (FLEXERIL) 10 MG tablet Take 1 tablet by mouth three times daily as needed for muscle spasm 08/09/21   [name], NP  ?doxycycline (VIBRAMYCIN) 100 MG capsule Take 1 capsule (100 mg total) by mouth 2 (two) times daily. 08/17/21   Teodora Medici, FNP  ?FIBER PO Take 1 capsule by mouth daily.    [provider]  ?losartan (COZAAR) 100 MG tablet Take 1 tablet (100 mg total) by mouth daily. 10/10/20   [name], NP  ?Multiple Vitamins-Minerals (MULTIVITAMIN WITH MINERALS) tablet Take 1 tablet by mouth daily.    [provider]  ?naproxen (NAPROSYN) 500 MG tablet Take 1 tablet (500 mg total) by mouth 2 (two) times daily. 06/20/21   Hans Eden, NP  ?omeprazole (PRILOSEC) 20 MG capsule Take 1 capsule by mouth once daily 08/28/21   [name], NP  ?potassium chloride SA (KLOR-CON) 20 MEQ tablet Take 2 tablets (40 mEq total) by mouth 2 (two) times daily for 1 day. 01/12/21 01/13/21  Kommor, Madison, MD  ?promethazine-dextromethorphan (PROMETHAZINE-DM) 6.25-15 MG/5ML syrup Take 5 mLs by mouth 4 (four) times daily as needed for cough. 06/20/21   Hans Eden, NP  ?Rimegepant Sulfate (NURTEC) 75 MG TBDP Take 75 mg by mouth daily as needed. 09/19/20   Penumalli, Earlean Polka, MD  ?rizatriptan (MAXALT-MLT) 10 MG disintegrating tablet Take 1 tablet (10 mg total) by mouth as needed for migraine. May repeat in 2 hours if needed 09/19/20   Penumalli, Earlean Polka, MD  ?sucralfate (CARAFATE) 1 g tablet Take 1 tablet (1 g total) by mouth 4 (four) times daily -  with meals and at bedtime. 09/14/21   Carmin Muskrat, MD  ?Tetrahydrozoline HCl (VISINE OP) Place 1 drop into both eyes daily as needed (redness).    [provider]  ?traZODone (DESYREL) 100 MG tablet TAKE 1 TABLET (100  MG TOTAL) BY MOUTH AT BEDTIME. ?Patient not taking: Reported on 02/28/2021 01/04/21 01/04/22  [name], NP  ?triamcinolone cream (KENALOG) 0.1 % Apply 1 application topically 2 (two) times daily. 09/08/19   Scot Jun, FNP  ?diphenhydrAMINE (BENADRYL) 25 MG tablet Take 1 tablet (25 mg total) by mouth every 6 (six) hours as needed. 04/04/20 06/17/20  Charlesetta Shanks, MD  ?metoCLOPramide (REGLAN) 10 MG tablet Take 1 tablet (10 mg total) by mouth every 6 (six) hours as needed for nausea (nausea/headache). Take this tablet with 25 mg of Benadryl and two extra strength Tylenol for headache. 04/27/20 06/17/20  [name], NP  ? ? ?Family History ?Family History  ?Problem Relation Age of Onset  ? Hypertension Father   ?  X4 siblings  ? Heart disease Father   ? Diabetes Mother   ?     x2 brothers  ? ? ?Social History ?Social History  ? ?Tobacco Use  ? Smoking status: Former  ?  Types: Cigarettes  ?  Quit date: 07/10/1998  ?  Years since quitting: 23.2  ? Smokeless tobacco: Never  ?Vaping Use  ? Vaping Use: Never used  ?Substance Use Topics  ? Alcohol use: Yes  ?  Alcohol/week: 6.0 standard drinks  ?  Types: 6 Cans of beer per week  ? Drug use: No  ? ? ? ?Allergies   ?Hydrocodone-acetaminophen and Shrimp [shellfish allergy] ? ? ?Review of Systems ?Review of Systems ?Per HPI ? ?Physical Exam ?Triage Vital Signs ?ED Triage Vitals  ?Enc Vitals Group  ?   BP 09/19/21 1524 122/79  ?   Pulse Rate 09/19/21 1524 79  ?   Resp 09/19/21 1524 18  ?   Temp 09/19/21 1524 99.8 ?F (37.7 ?C)  ?   Temp src --   ?   SpO2 09/19/21 1524 98 %  ?   Weight --   ?   Height --   ?   Head Circumference --   ?   Peak Flow --   ?   Pain Score 09/19/21 1521 10  ?   Pain Loc --   ?   Pain Edu? --   ?   Excl. in [place]? --   ? ?No data found. ? ?Updated Vital Signs ?BP 122/79   Pulse 79   Temp 99.8 ?F (37.7 ?C)   Resp 18   SpO2 98%  ? ?Visual Acuity ?Right Eye Distance:   ?Left Eye Distance:   ?Bilateral Distance:   ? ?Right Eye  Near:   ?Left Eye Near:    ?Bilateral Near:    ? ?Physical Exam ?Constitutional:   ?   General: He is not in acute distress. ?   Appearance: Normal appearance. He is not toxic-appearing or diaphoretic.  ?HENT:

## 2021-09-19 NOTE — Discharge Instructions (Addendum)
It appears that you may have gout.  This is being treated with prednisone.  Please follow-up if symptoms persist or worsen.  Go to the hospital if you develop a fever.  ?

## 2021-09-19 NOTE — ED Triage Notes (Signed)
PT reports rt ankle.rt foot Rt great toe. Pt walking with a cane on arrival. ?

## 2021-09-20 ENCOUNTER — Other Ambulatory Visit (INDEPENDENT_AMBULATORY_CARE_PROVIDER_SITE_OTHER): Payer: 59

## 2021-09-20 ENCOUNTER — Encounter: Payer: Self-pay | Admitting: Internal Medicine

## 2021-09-20 ENCOUNTER — Ambulatory Visit: Payer: 59 | Admitting: Internal Medicine

## 2021-09-20 VITALS — BP 114/76 | HR 90 | Ht 72.0 in | Wt 258.4 lb

## 2021-09-20 DIAGNOSIS — R195 Other fecal abnormalities: Secondary | ICD-10-CM

## 2021-09-20 DIAGNOSIS — E876 Hypokalemia: Secondary | ICD-10-CM

## 2021-09-20 DIAGNOSIS — K625 Hemorrhage of anus and rectum: Secondary | ICD-10-CM

## 2021-09-20 LAB — BASIC METABOLIC PANEL
BUN: 12 mg/dL (ref 6–23)
CO2: 30 mEq/L (ref 19–32)
Calcium: 8.3 mg/dL — ABNORMAL LOW (ref 8.4–10.5)
Chloride: 101 mEq/L (ref 96–112)
Creatinine, Ser: 1.13 mg/dL (ref 0.40–1.50)
GFR: 71.08 mL/min (ref >60.00)
Glucose, Bld: 117 mg/dL — ABNORMAL HIGH (ref 70–99)
Potassium: 3.2 mEq/L — ABNORMAL LOW (ref 3.5–5.1)
Sodium: 141 mEq/L (ref 135–145)

## 2021-09-20 MED ORDER — NA SULFATE-K SULFATE-MG SULF 17.5-3.13-1.6 GM/177ML PO SOLN: 1.0000 | Freq: Once | ORAL | 0 refills | Status: AC

## 2021-09-20 NOTE — Patient Instructions (Addendum)
If you are age 60 or older, your body mass index should be between 23-30. Your Body mass index is 35.04 kg/m?[name] If this is out of the aforementioned range listed, please consider follow up with your Primary Care Provider. ? ?If you are age 68 or younger, your body mass index should be between 19-25. Your Body mass index is 35.04 kg/m?[name] If this is out of the aformentioned range listed, please consider follow up with your Primary Care Provider.  ? ?________________________________________________________ ? ?The [facility] providers would like to encourage you to use [facility] to communicate with providers for non-urgent requests or questions.  Due to long hold times on the telephone, sending your provider a message by [facility] may be a faster and more efficient way to get a response.  Please allow 48 business hours for a response.  Please remember that this is for non-urgent requests.  ?_______________________________________________________ ? ?Your provider has requested that you go to the basement level for lab work before leaving today. Press "B" on the elevator. The lab is located at the first door on the left as you exit the elevator. ? ? ?You have been scheduled for an endoscopy and colonoscopy. Please follow the written instructions given to you at your visit today. ?Please pick up your prep supplies at the pharmacy within the next 1-3 days. ?If you use inhalers (even only as needed), please bring them with you on the day of your procedure. ? ?

## 2021-09-20 NOTE — Progress Notes (Signed)
HISTORY OF PRESENT ILLNESS: ? ?[name] is a 61 y.o. male with past medical history as listed below.  He does have a history of alcoholic pancreatitis for which she was hospitalized years ago.  He has not been seen in this office since March 2014.  He presents today regarding rectal bleeding.  He last underwent colonoscopy in August 2013.  At that time for routine screening.  He did report minor rectal bleeding.  The examination revealed right and left-sided diverticulosis.  No neoplasia.  Follow-up in 10 years recommended. ? ?He presents today regarding problems with intermittent rectal bleeding over the past several months.  Occasionally dark stools.[name]  He was seen in the emergency room Sep 14, 2021 with complaints of intermittent chest pain, abdominal pain, black stool, and red stool.  He had been drinking a substantial amount of alcohol prior to that visit.  Cardiac work-up was negative.  He was prescribed sucralfate, told to stop drinking alcohol and follow-up with GI. ?Review of blood work from Sep 14, 2021 reveals a hemoglobin of 12.4.  Potassium 2.9.  He has been on omeprazole 40 mg daily.  He was on a potassium supplement in the past, but no longer.  Tells me that his problems with abdominal discomfort have resolved.  He does describe hyperactive bowel sounds.  He tells me that he did stop drinking alcohol after his recent emergency room visit.  Tells me that he was diagnosed with gout yesterday and placed on prednisone.  He sees Juluis Mire, NP for his primary care. ? ?REVIEW OF SYSTEMS: ? ?All non-GI ROS negative unless otherwise stated in the HPI except for arthritis ? ?Past Medical History:  ?Diagnosis Date  ? Alcohol abuse   ? Anxiety   ? Arthritis   ? Diverticulosis of colon (without mention of hemorrhage)   ? GERD (gastroesophageal reflux disease)   ? History of kidney stones   ? Hyperlipemia   ? Hypertension   ? dr Ronalee Belts  norins  ? Kidney stones   ? Nephrolithiasis   ? Pancreatitis   ? Hx of    ? Ureteral stent displacement ([place]) 1990  ? ? ?Past Surgical History:  ?Procedure Laterality Date  ? A2 pulley flexor sheath cyst 4th finger excisional biopsy    ? '03  ? ANTERIOR CRUCIATE LIGAMENT REPAIR    ? R knee  ? ESOPHAGOGASTRODUODENOSCOPY N/A 07/09/2012  ? Procedure: ESOPHAGOGASTRODUODENOSCOPY (EGD);  Surgeon: Inda Castle, MD;  Location: [facility];  Service: Endoscopy;  Laterality: N/A;  ? KNEE SURGERY  2008  ? LEFT HEART CATH AND CORONARY ANGIOGRAPHY N/A 08/10/2019  ? Procedure: LEFT HEART CATH AND CORONARY ANGIOGRAPHY;  Surgeon: Lorretta Harp, MD;  Location: [facility];  Service: Cardiovascular;  Laterality: N/A;  ? SHOULDER ARTHROSCOPY WITH SUBACROMIAL DECOMPRESSION AND OPEN ROTATOR C Right 12/26/2012  ? Procedure: RIGHT SHOULDER ARTHROSCOPY WITH SUBACROMIAL DECOMPRESSION MINI OPEN ROTATOR CUFF REPAIR, OPEN BICEP TENODESIS OPEN DISTAL CLAVICLE RESECTION  ;  Surgeon: Augustin Schooling, MD;  Location: [facility];  Service: Orthopedics;  Laterality: Right;  ? TRANSFORAMINAL LUMBAR INTERBODY FUSION (TLIF) WITH PEDICLE SCREW FIXATION 1 LEVEL Right 03/27/2018  ? Procedure: RIGHT-SIDED LUMBAR 4-5 TRANSFORAMINAL LUMBAR INTERBODY FUSION WITH INSTRUMENTATION AND ALLOGRAFT;  Surgeon: Phylliss Bob, MD;  Location: [facility];  Service: Orthopedics;  Laterality: Right;  ? URETEROSCOPY    ? and stone retreval  ? ? ?Social History ?[name]  reports that he quit smoking about 23 years ago. His smoking use  included cigarettes. He has never used smokeless tobacco. He reports current alcohol use of about 6.0 standard drinks per week. He reports that he does not use drugs. ? ?family history includes Diabetes in his mother; Heart disease in his father; Hypertension in his father. ? ?Allergies  ?Allergen Reactions  ? Hydrocodone-Acetaminophen Palpitations  ? Shrimp [Shellfish Allergy] Other (See Comments)  ?  Cholesterol level becomes very high. Patient reports sweating and itching.  ? ? ?  ? ?PHYSICAL  EXAMINATION: ?Vital signs: BP 114/76   Pulse 90   Ht 6' (1.829 m)   Wt 258 lb 6 oz (117.2 kg)   BMI 35.04 kg/m?   ?Constitutional: generally well-appearing, no acute distress ?Psychiatric: alert and oriented x3, cooperative ?Eyes: extraocular movements intact, anicteric, conjunctiva pink ?Mouth: oral pharynx moist, no lesions ?Neck: supple no lymphadenopathy ?Cardiovascular: heart regular rate and rhythm, no murmur ?Lungs: clear to auscultation bilaterally ?Abdomen: soft, obese, nontender, nondistended, no obvious ascites, no peritoneal signs, normal bowel sounds, no organomegaly.  Small umbilical hernia ?Rectal: Deferred to colonoscopy ?Extremities: no clubbing, cyanosis, or lower extremity edema bilaterally ?Skin: no lesions on visible extremities ?Neuro: No focal deficits.  Cranial nerves intact. ? ?ASSESSMENT: ? ?1.  Intermittent rectal bleeding.  Last colonoscopy 10 years ago. ?2.  Dark stools.  Significance uncertain ?3.  Chronic GERD.  On PPI ?4.  Hypokalemia during ER visit last week ?5.  Obesity ?6.  Multiple medical problems.  Stable ? ?PLAN: ? ?1.  Basic metabolic panel today.  If still hypokalemic will prescribe potassium supplement and have him follow-up with his PCP regarding monitoring of this issue. ?2.  Schedule colonoscopy to evaluate rectal bleeding.The nature of the procedure, as well as the risks, benefits, and alternatives were carefully and thoroughly reviewed with the patient. Ample time for discussion and questions allowed. The patient understood, was satisfied, and agreed to proceed.  ?3.  Schedule upper endoscopy to evaluate dark stools.The nature of the procedure, as well as the risks, benefits, and alternatives were carefully and thoroughly reviewed with the patient. Ample time for discussion and questions allowed. The patient understood, was satisfied, and agreed to proceed.  ?4.  Continue PPI ?A total time of 60 minutes was spent preparing to see the patient, reviewing outside ER  evaluation, laboratories, and prior endoscopy reports.  Obtaining comprehensive history, performing medically appropriate physical examination, counseling and educating the patient regarding the above listed issues, ordering laboratory work and multiple endoscopic procedures.  Finally, documenting clinical information in the health record. ? ? ? ?  ?

## 2021-09-21 ENCOUNTER — Other Ambulatory Visit: Payer: Self-pay

## 2021-09-21 ENCOUNTER — Other Ambulatory Visit (INDEPENDENT_AMBULATORY_CARE_PROVIDER_SITE_OTHER): Payer: Self-pay | Admitting: Primary Care

## 2021-09-21 DIAGNOSIS — Z760 Encounter for issue of repeat prescription: Secondary | ICD-10-CM

## 2021-09-21 DIAGNOSIS — E876 Hypokalemia: Secondary | ICD-10-CM

## 2021-09-21 DIAGNOSIS — I10 Essential (primary) hypertension: Secondary | ICD-10-CM

## 2021-09-21 MED ORDER — AMLODIPINE BESYLATE 10 MG PO TABS: 10.0000 mg | ORAL_TABLET | Freq: Every day | ORAL | 0 refills | Status: DC

## 2021-09-21 MED ORDER — CARVEDILOL 25 MG PO TABS: 25.0000 mg | ORAL_TABLET | Freq: Two times a day (BID) | ORAL | 0 refills | Status: DC

## 2021-09-21 MED ORDER — POTASSIUM CHLORIDE CRYS ER 20 MEQ PO TBCR: 20.0000 meq | EXTENDED_RELEASE_TABLET | Freq: Every day | ORAL | 0 refills | Status: DC

## 2021-09-21 MED ORDER — LOSARTAN POTASSIUM 100 MG PO TABS: 100.0000 mg | ORAL_TABLET | Freq: Every day | ORAL | 0 refills | Status: DC

## 2021-09-21 NOTE — Telephone Encounter (Signed)
Medication Refill - Medication: carvedilol (COREG) 25 MG tablet  losartan (COZAAR) 100 MG tabletam  LODipine (NORVASC) 10 MG tablet  Has the patient contacted their pharmacy? Yes.   (Agent: If no, request that the patient contact the pharmacy for the refill. If patient does not wish to contact the pharmacy document the reason why and proceed with request.) (Agent: If yes, when and what did the pharmacy advise?) call dr no refill pt is out of meds now states can not wait till appt  Preferred Pharmacy (with phone number or street name):  [place], [place] [address]  Phone: [phone] Fax: [phone]  Hours: Not open 24 hours   Has the patient been seen for an appointment in the last year OR does the patient have an upcoming appointment? Yes.  10/05/21 6/1 Agent: Please be advised that RX refills may take up to 3 business days. We ask that you follow-up with your pharmacy.

## 2021-09-21 NOTE — Telephone Encounter (Signed)
Requested Prescriptions  Pending Prescriptions Disp Refills  . amLODipine (NORVASC) 10 MG tablet 90 tablet 0    Sig: Take 1 tablet (10 mg total) by mouth daily.     Cardiovascular: Calcium Channel Blockers 2 Passed - 09/21/2021  1:17 PM      Passed - Last BP in normal range    BP Readings from Last 1 Encounters:  09/20/21 114/76         Passed - Last Heart Rate in normal range    Pulse Readings from Last 1 Encounters:  09/20/21 90         Passed - Valid encounter within last 6 months    Recent Outpatient Visits          5 months ago Chronic bilateral low back pain, unspecified whether sciatica present   [place], Michelle P, NP   8 months ago Irritation of both ears   [place], Michelle P, NP   8 months ago Essential hypertension   [place], Michelle P, NP   11 months ago Annual physical exam   [place] [name], NP   11 months ago Essential hypertension   [place], Michelle P, NP      Future Appointments            In 2 weeks Oletta Lamas Milford Cage, NP [place]           . carvedilol (COREG) 25 MG tablet 180 tablet 0    Sig: Take 1 tablet (25 mg total) by mouth 2 (two) times daily with a meal.     Cardiovascular: Beta Blockers 3 Passed - 09/21/2021  1:17 PM      Passed - Cr in normal range and within 360 days    Creatinine, Ser  Date Value Ref Range Status  09/20/2021 1.13 0.40 - 1.50 mg/dL Final         Passed - AST in normal range and within 360 days    AST  Date Value Ref Range Status  09/14/2021 36 15 - 41 U/L Final         Passed - ALT in normal range and within 360 days    ALT  Date Value Ref Range Status  09/14/2021 41 0 - 44 U/L Final         Passed - Last BP in normal range    BP Readings from Last 1 Encounters:  09/20/21 114/76         Passed -  Last Heart Rate in normal range    Pulse Readings from Last 1 Encounters:  09/20/21 90         Passed - Valid encounter within last 6 months    Recent Outpatient Visits          5 months ago Chronic bilateral low back pain, unspecified whether sciatica present   [place], Michelle P, NP   8 months ago Irritation of both ears   [place], Michelle P, NP   8 months ago Essential hypertension   [place], Michelle P, NP   11 months ago Annual physical exam   [place] [name], NP   11 months ago Essential hypertension   [place] [name], NP  Future Appointments            In 2 weeks Oletta Lamas Milford Cage, NP [place]           . losartan (COZAAR) 100 MG tablet 90 tablet 0    Sig: Take 1 tablet (100 mg total) by mouth daily.     Cardiovascular:  Angiotensin Receptor Blockers Failed - 09/21/2021  1:17 PM      Failed - K in normal range and within 180 days    Potassium  Date Value Ref Range Status  09/20/2021 3.2 (L) 3.5 - 5.1 mEq/L Final         Passed - Cr in normal range and within 180 days    Creatinine, Ser  Date Value Ref Range Status  09/20/2021 1.13 0.40 - 1.50 mg/dL Final         Passed - Patient is not pregnant      Passed - Last BP in normal range    BP Readings from Last 1 Encounters:  09/20/21 114/76         Passed - Valid encounter within last 6 months    Recent Outpatient Visits          5 months ago Chronic bilateral low back pain, unspecified whether sciatica present   [place], Michelle P, NP   8 months ago Irritation of both ears   [place], Michelle P, NP   8 months ago Essential hypertension   [place], Michelle P, NP   11 months ago Annual  physical exam   [place] [name], NP   11 months ago Essential hypertension   [place], Michelle P, NP      Future Appointments            In 2 weeks Oletta Lamas, [place] Cage, NP [place]

## 2021-10-05 ENCOUNTER — Encounter (INDEPENDENT_AMBULATORY_CARE_PROVIDER_SITE_OTHER): Payer: Self-pay | Admitting: Primary Care

## 2021-10-05 ENCOUNTER — Ambulatory Visit (INDEPENDENT_AMBULATORY_CARE_PROVIDER_SITE_OTHER): Payer: 59 | Admitting: Primary Care

## 2021-10-05 VITALS — BP 124/81 | HR 85 | Temp 98.0°F | Ht 72.0 in | Wt 259.2 lb

## 2021-10-05 DIAGNOSIS — R609 Edema, unspecified: Secondary | ICD-10-CM | POA: Diagnosis not present

## 2021-10-05 DIAGNOSIS — Z09 Encounter for follow-up examination after completed treatment for conditions other than malignant neoplasm: Secondary | ICD-10-CM

## 2021-10-05 DIAGNOSIS — I10 Essential (primary) hypertension: Secondary | ICD-10-CM

## 2021-10-05 DIAGNOSIS — Z760 Encounter for issue of repeat prescription: Secondary | ICD-10-CM | POA: Diagnosis not present

## 2021-10-05 MED ORDER — AMLODIPINE BESYLATE 10 MG PO TABS: 10.0000 mg | ORAL_TABLET | Freq: Every day | ORAL | 1 refills | Status: DC

## 2021-10-05 MED ORDER — LOSARTAN POTASSIUM 100 MG PO TABS: 100.0000 mg | ORAL_TABLET | Freq: Every day | ORAL | 1 refills | Status: DC

## 2021-10-05 MED ORDER — CARVEDILOL 25 MG PO TABS: 25.0000 mg | ORAL_TABLET | Freq: Two times a day (BID) | ORAL | 1 refills | Status: DC

## 2021-10-05 NOTE — Progress Notes (Signed)
[facility]   Subjective:   [name] is a 61 y.o. male presents for emergency care . Patient on 09/19/21 presented to the ED with right ankle and right foot pain and  discharged the same day with diagnosis Pain in joint involving right ankle and foot.  Past Medical History:  Diagnosis Date   Alcohol abuse    Anxiety    Arthritis    Diverticulosis of colon (without mention of hemorrhage)    GERD (gastroesophageal reflux disease)    Gout    History of kidney stones    Hyperlipemia    Hypertension    dr Ronalee Belts  norins   Kidney stones    Nephrolithiasis    Pancreatitis    Hx of    Ureteral stent displacement ([facility]) 1990     Allergies  Allergen Reactions   Hydrocodone-Acetaminophen Palpitations   Shrimp [Shellfish Allergy] Other (See Comments)    Cholesterol level becomes very high. Patient reports sweating and itching.      Current Outpatient Medications on File Prior to Visit  Medication Sig Dispense Refill   amLODipine (NORVASC) 10 MG tablet Take 1 tablet (10 mg total) by mouth daily. 90 tablet 0   atorvastatin (LIPITOR) 40 MG tablet Take 1 tablet by mouth once daily 90 tablet 3   carvedilol (COREG) 25 MG tablet Take 1 tablet (25 mg total) by mouth 2 (two) times daily with a meal. 180 tablet 0   cyclobenzaprine (FLEXERIL) 10 MG tablet Take 1 tablet by mouth three times daily as needed for muscle spasm 60 tablet 1   losartan (COZAAR) 100 MG tablet Take 1 tablet (100 mg total) by mouth daily. 90 tablet 0   Multiple Vitamins-Minerals (MULTIVITAMIN WITH MINERALS) tablet Take 1 tablet by mouth daily.     omeprazole (PRILOSEC) 40 MG capsule Take 40 mg by mouth daily.     oxyCODONE-acetaminophen (PERCOCET) 10-325 MG tablet Take 1 tablet by mouth 3 (three) times daily as needed.     potassium chloride SA (KLOR-CON M) 20 MEQ tablet Take 1 tablet (20 mEq total) by mouth daily. 14 tablet 0   rizatriptan (MAXALT-MLT) 10 MG disintegrating tablet Take 1 tablet (10 mg  total) by mouth as needed for migraine. May repeat in 2 hours if needed 9 tablet 11   Tetrahydrozoline HCl (VISINE OP) Place 1 drop into both eyes daily as needed (redness).     triamcinolone cream (KENALOG) 0.1 % Apply 1 application topically 2 (two) times daily. (Patient taking differently: Apply 1 application. topically as needed.) 60 g 0   Rimegepant Sulfate (NURTEC) 75 MG TBDP Take 75 mg by mouth daily as needed. (Patient not taking: Reported on 09/20/2021) 8 tablet 6   [DISCONTINUED] diphenhydrAMINE (BENADRYL) 25 MG tablet Take 1 tablet (25 mg total) by mouth every 6 (six) hours as needed. 30 tablet 0   [DISCONTINUED] metoCLOPramide (REGLAN) 10 MG tablet Take 1 tablet (10 mg total) by mouth every 6 (six) hours as needed for nausea (nausea/headache). Take this tablet with 25 mg of Benadryl and two extra strength Tylenol for headache. 12 tablet 0   No current facility-administered medications on file prior to visit.   Review of System: Comprehensive ROS Pertinent positive and negative noted in HPI    Objective:  BP 124/81   Pulse 85   Temp 98 F (36.7 C) (Oral)   Ht 6' (1.829 m)   Wt 259 lb 3.2 oz (117.6 kg)   SpO2 94%   BMI  35.15 kg/m   Filed Weights   10/05/21 1442  Weight: 259 lb 3.2 oz (117.6 kg)    Physical Exam: General Appearance: Well nourished, obese male,in no apparent distress. Eyes: PERRLA, EOMs, conjunctiva no swelling or erythema Sinuses: No Frontal/maxillary tenderness ENT/Mouth: Ext aud canals clear,  Hearing normal.  Neck: Supple, thyroid normal.  Respiratory: Respiratory effort normal, BS equal bilaterally without rales, rhonchi, wheezing or stridor.  Cardio: RRR with no MRGs. Brisk peripheral pulses without edema.  Abdomen: Soft, + BS.  Non tender, no guarding, rebound, hernias, masses. Lymphatics: Non tender without lymphadenopathy.  Musculoskeletal: Full ROM, 5/5 strength, normal gait.  Skin: Warm, dry without rashes, lesions, ecchymosis.  Neuro:  Cranial nerves intact. Normal muscle tone, no cerebellar symptoms. Sensation intact.  Psych: Awake and oriented X 3, normal affect, Insight and Judgment appropriate.   Assessment:  [name] was seen today for hospitalization follow-up.  Diagnoses and all orders for this visit:  Hospital discharge follow-up No discharge orders or follow up   Dependent edema Advised to purchase Compression stockings are specially designed to apply pressure to your lower legs, helping to maintain blood flow and reduce discomfort and swelling.   Essential hypertension -Well controlled Bp <130/80 low-sodium, DASH diet, medication compliance, 150 minutes of moderate intensity exercise per week. Discussed medication compliance, adverse effects.   Medication refill -     amLODipine (NORVASC) 10 MG tablet; Take 1 tablet (10 mg total) by mouth daily. -     carvedilol (COREG) 25 MG tablet; Take 1 tablet (25 mg total) by mouth 2 (two) times daily with a meal. -     losartan (COZAAR) 100 MG tablet; Take 1 tablet (100 mg total) by mouth daily.         This note has been created with Surveyor, quantity. Any transcriptional errors are unintentional.   [name], NP 10/05/2021, 2:47 PM

## 2021-10-06 ENCOUNTER — Telehealth (INDEPENDENT_AMBULATORY_CARE_PROVIDER_SITE_OTHER): Payer: Self-pay

## 2021-10-06 ENCOUNTER — Telehealth: Payer: Self-pay

## 2021-10-06 NOTE — Telephone Encounter (Signed)
Copied from [place] [id number]. Topic: General - Inquiry >> Oct 06, 2021 11:36 AM Roslynn Amble wrote: Reason for CRM: Patient called and stated he forgot to mention to provider Juluis Mire when he saw her that he was taking Aspirin '81mg'$  and had stopped it. Patient would like to know does he need to start back taking it again?

## 2021-10-06 NOTE — Telephone Encounter (Signed)
-----   Message from Algernon Huxley, RN sent at 09/21/2021 12:38 PM EDT ----- Regarding: Lab Pt needs labs-orders in epic

## 2021-10-06 NOTE — Telephone Encounter (Signed)
Pt aware.

## 2021-10-09 NOTE — Telephone Encounter (Signed)
Per DPR left voicemail informing patient that PCP advise was to not start back taking aspirin as it can cause bleeding and he already has history of rectal bleeding and is followed by GI. Asked him to return call to office with any other questions or concerns.

## 2021-10-12 ENCOUNTER — Other Ambulatory Visit (INDEPENDENT_AMBULATORY_CARE_PROVIDER_SITE_OTHER): Payer: 59

## 2021-10-12 DIAGNOSIS — E876 Hypokalemia: Secondary | ICD-10-CM

## 2021-10-12 LAB — BASIC METABOLIC PANEL
BUN: 11 mg/dL (ref 6–23)
CO2: 27 mEq/L (ref 19–32)
Calcium: 9.2 mg/dL (ref 8.4–10.5)
Chloride: 104 mEq/L (ref 96–112)
Creatinine, Ser: 1.15 mg/dL (ref 0.40–1.50)
GFR: 69.57 mL/min (ref >60.00)
Glucose, Bld: 100 mg/dL — ABNORMAL HIGH (ref 70–99)
Potassium: 3.8 mEq/L (ref 3.5–5.1)
Sodium: 140 mEq/L (ref 135–145)

## 2021-10-12 LAB — MAGNESIUM: Magnesium: 1.3 mg/dL — ABNORMAL LOW (ref 1.5–2.5)

## 2021-10-16 ENCOUNTER — Telehealth: Payer: Self-pay | Admitting: Internal Medicine

## 2021-10-17 ENCOUNTER — Encounter: Payer: 59 | Admitting: Internal Medicine

## 2021-11-08 ENCOUNTER — Encounter: Payer: Self-pay | Admitting: Internal Medicine

## 2021-11-08 ENCOUNTER — Ambulatory Visit (AMBULATORY_SURGERY_CENTER): Payer: 59 | Admitting: Internal Medicine

## 2021-11-08 VITALS — BP 123/80 | HR 75 | Temp 97.0°F | Resp 15 | Ht 72.0 in | Wt 259.0 lb

## 2021-11-08 DIAGNOSIS — D122 Benign neoplasm of ascending colon: Secondary | ICD-10-CM

## 2021-11-08 DIAGNOSIS — K449 Diaphragmatic hernia without obstruction or gangrene: Secondary | ICD-10-CM | POA: Diagnosis not present

## 2021-11-08 DIAGNOSIS — K625 Hemorrhage of anus and rectum: Secondary | ICD-10-CM | POA: Diagnosis not present

## 2021-11-08 DIAGNOSIS — K222 Esophageal obstruction: Secondary | ICD-10-CM | POA: Diagnosis not present

## 2021-11-08 DIAGNOSIS — K5730 Diverticulosis of large intestine without perforation or abscess without bleeding: Secondary | ICD-10-CM | POA: Diagnosis not present

## 2021-11-08 DIAGNOSIS — K648 Other hemorrhoids: Secondary | ICD-10-CM | POA: Diagnosis not present

## 2021-11-08 DIAGNOSIS — R195 Other fecal abnormalities: Secondary | ICD-10-CM

## 2021-11-08 DIAGNOSIS — E876 Hypokalemia: Secondary | ICD-10-CM

## 2021-11-08 DIAGNOSIS — K921 Melena: Secondary | ICD-10-CM

## 2021-11-08 DIAGNOSIS — K219 Gastro-esophageal reflux disease without esophagitis: Secondary | ICD-10-CM

## 2021-11-08 MED ORDER — SODIUM CHLORIDE 0.9 % IV SOLN: 500.0000 mL | Freq: Once | INTRAVENOUS | Status: DC

## 2021-11-08 NOTE — Op Note (Signed)
[facility] Patient Name: [name] Procedure Date: 11/08/2021 2:10 PM MRN: [id number] Endoscopist: Docia Chuck. Henrene Pastor , MD Age: 61 Referring MD:  Date of Birth: [birth year] Gender: Male Account #: [id number] Procedure:                Upper GI endoscopy Indications:              Melena (transient dark stools, question melena) Medicines:                Monitored Anesthesia Care Procedure:                Pre-Anesthesia Assessment:                           - Prior to the procedure, a History and Physical                            was performed, and patient medications and                            allergies were reviewed. The patient's tolerance of                            previous anesthesia was also reviewed. The risks                            and benefits of the procedure and the sedation                            options and risks were discussed with the patient.                            All questions were answered, and informed consent                            was obtained. Prior Anticoagulants: The patient has                            taken no previous anticoagulant or antiplatelet                            agents. ASA Grade Assessment: II - A patient with                            mild systemic disease. After reviewing the risks                            and benefits, the patient was deemed in                            satisfactory condition to undergo the procedure.                           After obtaining informed consent, the endoscope was  passed under direct vision. Throughout the                            procedure, the patient's blood pressure, pulse, and                            oxygen saturations were monitored continuously. The                            Endoscope was introduced through the mouth, and                            advanced to the second part of duodenum. The upper                            GI endoscopy  was accomplished without difficulty.                            The patient tolerated the procedure well. Scope In: Scope Out: Findings:                 The esophagus revealed a large caliber distal ring                            but was otherwise normal.                           The stomach was normal. Small hiatal hernia.                           The examined duodenum was normal.                           The cardia and gastric fundus were normal on                            retroflexion. Complications:            No immediate complications. Estimated Blood Loss:     Estimated blood loss: none. Impression:               1. Unremarkable EGD                           2. GERD. Recommendation:           - Patient has a contact number available for                            emergencies. The signs and symptoms of potential                            delayed complications were discussed with the                            patient. Return to normal activities tomorrow.  Written discharge instructions were provided to the                            patient.                           - Resume previous diet.                           - Continue present medications.                           - Return to the care of your primary provider. GI                            follow-up as needed Docia Chuck. Henrene Pastor, MD 11/08/2021 2:56:46 PM This report has been signed electronically.

## 2021-11-08 NOTE — Progress Notes (Signed)
HISTORY OF PRESENT ILLNESS:   [name] is a 61 y.o. male with past medical history as listed below.  He does have a history of alcoholic pancreatitis for which she was hospitalized years ago.  He has not been seen in this office since March 2014.  He presents today regarding rectal bleeding.  He last underwent colonoscopy in August 2013.  At that time for routine screening.  He did report minor rectal bleeding.  The examination revealed right and left-sided diverticulosis.  No neoplasia.  Follow-up in 10 years recommended.  He presents today regarding problems with intermittent rectal bleeding over the past several months.  Occasionally dark stools.[name]  He was seen in the emergency room Sep 14, 2021 with complaints of intermittent chest pain, abdominal pain, black stool, and red stool.  He had been drinking a substantial amount of alcohol prior to that visit.  Cardiac work-up was negative.  He was prescribed sucralfate, told to stop drinking alcohol and follow-up with GI. Review of blood work from Sep 14, 2021 reveals a hemoglobin of 12.4.  Potassium 2.9.  He has been on omeprazole 40 mg daily.  He was on a potassium supplement in the past, but no longer.  Tells me that his problems with abdominal discomfort have resolved.  He does describe hyperactive bowel sounds.  He tells me that he did stop drinking alcohol after his recent emergency room visit.  Tells me that he was diagnosed with gout yesterday and placed on prednisone.  He sees Martin Mire, NP for his primary care.   REVIEW OF SYSTEMS:   All non-GI ROS negative unless otherwise stated in the HPI except for arthritis       Past Medical History:  Diagnosis Date   Alcohol abuse     Anxiety     Arthritis     Diverticulosis of colon (without mention of hemorrhage)     GERD (gastroesophageal reflux disease)     History of kidney stones     Hyperlipemia     Hypertension      dr Ronalee Belts  norins   Kidney stones     Nephrolithiasis      Pancreatitis      Hx of    Ureteral stent displacement ([place]) 1990           Past Surgical History:  Procedure Laterality Date   A2 pulley flexor sheath cyst 4th finger excisional biopsy        '03   ANTERIOR CRUCIATE LIGAMENT REPAIR        R knee   ESOPHAGOGASTRODUODENOSCOPY N/A 07/09/2012    Procedure: ESOPHAGOGASTRODUODENOSCOPY (EGD);  Surgeon: Inda Castle, MD;  Location: [facility];  Service: Endoscopy;  Laterality: N/A;   KNEE SURGERY   2008   LEFT HEART CATH AND CORONARY ANGIOGRAPHY N/A 08/10/2019    Procedure: LEFT HEART CATH AND CORONARY ANGIOGRAPHY;  Surgeon: Lorretta Harp, MD;  Location: [facility];  Service: Cardiovascular;  Laterality: N/A;   SHOULDER ARTHROSCOPY WITH SUBACROMIAL DECOMPRESSION AND OPEN ROTATOR C Right 12/26/2012    Procedure: RIGHT SHOULDER ARTHROSCOPY WITH SUBACROMIAL DECOMPRESSION MINI OPEN ROTATOR CUFF REPAIR, OPEN BICEP TENODESIS OPEN DISTAL CLAVICLE RESECTION  ;  Surgeon: Augustin Schooling, MD;  Location: [facility];  Service: Orthopedics;  Laterality: Right;   TRANSFORAMINAL LUMBAR INTERBODY FUSION (TLIF) WITH PEDICLE SCREW FIXATION 1 LEVEL Right 03/27/2018    Procedure: RIGHT-SIDED LUMBAR 4-5 TRANSFORAMINAL LUMBAR INTERBODY FUSION WITH INSTRUMENTATION AND ALLOGRAFT;  Surgeon: Phylliss Bob, MD;  Location: [facility];  Service: Orthopedics;  Laterality: Right;   URETEROSCOPY        and stone retreval      Social History [name]  reports that he quit smoking about 23 years ago. His smoking use included cigarettes. He has never used smokeless tobacco. He reports current alcohol use of about 6.0 standard drinks per week. He reports that he does not use drugs.   family history includes Diabetes in his mother; Heart disease in his father; Hypertension in his father.        Allergies  Allergen Reactions   Hydrocodone-Acetaminophen Palpitations   Shrimp [Shellfish Allergy] Other (See Comments)      Cholesterol level becomes very high. Patient  reports sweating and itching.          PHYSICAL EXAMINATION: Vital signs: BP 114/76   Pulse 90   Ht 6' (1.829 m)   Wt 258 lb 6 oz (117.2 kg)   BMI 35.04 kg/m   Constitutional: generally well-appearing, no acute distress Psychiatric: alert and oriented x3, cooperative Eyes: extraocular movements intact, anicteric, conjunctiva pink Mouth: oral pharynx moist, no lesions Neck: supple no lymphadenopathy Cardiovascular: heart regular rate and rhythm, no murmur Lungs: clear to auscultation bilaterally Abdomen: soft, obese, nontender, nondistended, no obvious ascites, no peritoneal signs, normal bowel sounds, no organomegaly.  Small umbilical hernia Rectal: Deferred to colonoscopy Extremities: no clubbing, cyanosis, or lower extremity edema bilaterally Skin: no lesions on visible extremities Neuro: No focal deficits.  Cranial nerves intact.   ASSESSMENT:   1.  Intermittent rectal bleeding.  Last colonoscopy 10 years ago. 2.  Dark stools.  Significance uncertain 3.  Chronic GERD.  On PPI 4.  Hypokalemia during ER visit last week 5.  Obesity 6.  Multiple medical problems.  Stable   PLAN:   1.  Basic metabolic panel today.  If still hypokalemic will prescribe potassium supplement and have him follow-up with his PCP regarding monitoring of this issue. 2.  Schedule colonoscopy to evaluate rectal bleeding.The nature of the procedure, as well as the risks, benefits, and alternatives were carefully and thoroughly reviewed with the patient. Ample time for discussion and questions allowed. The patient understood, was satisfied, and agreed to proceed.  3.  Schedule upper endoscopy to evaluate dark stools.The nature of the procedure, as well as the risks, benefits, and alternatives were carefully and thoroughly reviewed with the patient. Ample time for discussion and questions allowed. The patient understood, was satisfied, and agreed to proceed.  4.  Continue PPI  Recent office evaluation for  colonoscopy and upper endoscopy as outlined above.  No interval clinical changes.  No change in physical exam.  Now for colonoscopy and upper endoscopy

## 2021-11-08 NOTE — Progress Notes (Signed)
Report to pacu rn. Vss. Care resumed by rn. 

## 2021-11-08 NOTE — Progress Notes (Signed)
Pt's states no medical or surgical changes since previsit or office visit. 

## 2021-11-08 NOTE — Patient Instructions (Signed)
Please read handouts provided. Continue present medications.  Await pathology results. GI follow-up as needed. Repeat colonoscopy in 10 years for screening. Resume previous diet.   YOU HAD AN ENDOSCOPIC PROCEDURE TODAY AT [facility]:   Refer to the procedure report that was given to you for any specific questions about what was found during the examination.  If the procedure report does not answer your questions, please call your gastroenterologist to clarify.  If you requested that your care partner not be given the details of your procedure findings, then the procedure report has been included in a sealed envelope for you to review at your convenience later.  YOU SHOULD EXPECT: Some feelings of bloating in the abdomen. Passage of more gas than usual.  Walking can help get rid of the air that was put into your GI tract during the procedure and reduce the bloating. If you had a lower endoscopy (such as a colonoscopy or flexible sigmoidoscopy) you may notice spotting of blood in your stool or on the toilet paper. If you underwent a bowel prep for your procedure, you may not have a normal bowel movement for a few days.  Please Note:  You might notice some irritation and congestion in your nose or some drainage.  This is from the oxygen used during your procedure.  There is no need for concern and it should clear up in a day or so.  SYMPTOMS TO REPORT IMMEDIATELY:  Following lower endoscopy (colonoscopy or flexible sigmoidoscopy):  Excessive amounts of blood in the stool  Significant tenderness or worsening of abdominal pains  Swelling of the abdomen that is new, acute  Fever of 100F or higher  Following upper endoscopy (EGD)  Vomiting of blood or coffee ground material  New chest pain or pain under the shoulder blades  Painful or persistently difficult swallowing  New shortness of breath  Fever of 100F or higher  Black, tarry-looking stools  For urgent or emergent  issues, a gastroenterologist can be reached at any hour by calling [phone]. Do not use MyChart messaging for urgent concerns.    DIET:  We do recommend a small meal at first, but then you may proceed to your regular diet.  Drink plenty of fluids but you should avoid alcoholic beverages for 24 hours.  ACTIVITY:  You should plan to take it easy for the rest of today and you should NOT DRIVE or use heavy machinery until tomorrow (because of the sedation medicines used during the test).    FOLLOW UP: Our staff will call the number listed on your records the next business day following your procedure.  We will call around 7:15- 8:00 am to check on you and address any questions or concerns that you may have regarding the information given to you following your procedure. If we do not reach you, we will leave a message.  If you develop any symptoms (ie: fever, flu-like symptoms, shortness of breath, cough etc.) before then, please call [phone].  If you test positive for Covid 19 in the 2 weeks post procedure, please call and report this information to [place].    If any biopsies were taken you will be contacted by phone or by letter within the next 1-3 weeks.  Please call us at [phone] if you have not heard about the biopsies in 3 weeks.    SIGNATURES/CONFIDENTIALITY: You and/or your care partner have signed paperwork which will be entered into your electronic medical record.  These  signatures attest to the fact that that the information above on your After Visit Summary has been reviewed and is understood.  Full responsibility of the confidentiality of this discharge information lies with you and/or your care-partner.

## 2021-11-08 NOTE — Op Note (Signed)
[facility] Patient Name: [name] Procedure Date: 11/08/2021 2:11 PM MRN: [id number] Endoscopist: Docia Chuck. Henrene Pastor , MD Age: 61 Referring MD:  Date of Birth: 1961-01-08 Gender: Male Account #: [id number] Procedure:                Colonoscopy with cold snare polypectomy x 1 Indications:              Rectal bleeding. Previous examination 2013 was                            negative for neoplasia Medicines:                Monitored Anesthesia Care Procedure:                Pre-Anesthesia Assessment:                           - Prior to the procedure, a History and Physical                            was performed, and patient medications and                            allergies were reviewed. The patient's tolerance of                            previous anesthesia was also reviewed. The risks                            and benefits of the procedure and the sedation                            options and risks were discussed with the patient.                            All questions were answered, and informed consent                            was obtained. Prior Anticoagulants: The patient has                            taken no previous anticoagulant or antiplatelet                            agents. ASA Grade Assessment: II - A patient with                            mild systemic disease. After reviewing the risks                            and benefits, the patient was deemed in                            satisfactory condition to undergo the procedure.  After obtaining informed consent, the colonoscope                            was passed under direct vision. Throughout the                            procedure, the patient's blood pressure, pulse, and                            oxygen saturations were monitored continuously. The                            CF [id number] #[id number] was introduced through the anus                            and advanced  to the the cecum, identified by                            appendiceal orifice and ileocecal valve. The                            ileocecal valve, appendiceal orifice, and rectum                            were photographed. The quality of the bowel                            preparation was excellent. The colonoscopy was                            performed without difficulty. The patient tolerated                            the procedure well. The bowel preparation used was                            SUPREP via split dose instruction. Scope In: 2:14:47 PM Scope Out: 2:35:50 PM Scope Withdrawal Time: 0 hours 8 minutes 57 seconds  Total Procedure Duration: 0 hours 21 minutes 3 seconds  Findings:                 A 1 mm polyp was found in the ascending colon. The                            polyp was removed with a cold snare. Resection and                            retrieval were complete.                           Multiple diverticula were found in the left colon                            and right colon.  Internal hemorrhoids were found during                            retroflexion. The hemorrhoids were moderate.                           The exam was otherwise without abnormality on                            direct and retroflexion views. The colon is                            redundant. Complications:            No immediate complications. Estimated blood loss:                            None. Estimated Blood Loss:     Estimated blood loss: none. Impression:               - One 1 mm polyp in the ascending colon, removed                            with a cold snare. Resected and retrieved.                           - Diverticulosis in the left colon and in the right                            colon.                           - Internal hemorrhoids. Redundant colon.                           - The examination was otherwise normal on direct                             and retroflexion views. Recommendation:           - Repeat colonoscopy in 10 years for surveillance.                           - Patient has a contact number available for                            emergencies. The signs and symptoms of potential                            delayed complications were discussed with the                            patient. Return to normal activities tomorrow.                            Written discharge instructions were provided to the  patient.                           - Resume previous diet.                           - Continue present medications.                           - Await pathology results. Docia Chuck. Henrene Pastor, MD 11/08/2021 2:54:17 PM This report has been signed electronically.

## 2021-11-09 ENCOUNTER — Telehealth: Payer: Self-pay

## 2021-11-09 NOTE — Telephone Encounter (Signed)
  Follow up Call-     11/08/2021    1:52 PM  Call back number  Post procedure Call Back phone  # [phone]  Permission to leave phone message Yes     Patient questions:  Do you have a fever, pain , or abdominal swelling? No. Pain Score  0 *  Have you tolerated food without any problems? Yes.    Have you been able to return to your normal activities? Yes.    Do you have any questions about your discharge instructions: Diet   No. Medications  No. Follow up visit  No.  Do you have questions or concerns about your Care? No.  Actions: * If pain score is 4 or above: No action needed, pain <4.

## 2021-11-13 ENCOUNTER — Encounter: Payer: Self-pay | Admitting: Internal Medicine

## 2021-12-13 ENCOUNTER — Encounter (INDEPENDENT_AMBULATORY_CARE_PROVIDER_SITE_OTHER): Payer: Self-pay

## 2022-01-10 ENCOUNTER — Encounter (INDEPENDENT_AMBULATORY_CARE_PROVIDER_SITE_OTHER): Payer: Self-pay | Admitting: Primary Care

## 2022-01-10 ENCOUNTER — Ambulatory Visit (INDEPENDENT_AMBULATORY_CARE_PROVIDER_SITE_OTHER): Payer: Self-pay | Admitting: Primary Care

## 2022-01-10 VITALS — BP 117/81 | HR 89 | Temp 98.1°F | Ht 72.0 in | Wt 254.0 lb

## 2022-01-10 DIAGNOSIS — Z760 Encounter for issue of repeat prescription: Secondary | ICD-10-CM

## 2022-01-10 DIAGNOSIS — G4700 Insomnia, unspecified: Secondary | ICD-10-CM

## 2022-01-10 DIAGNOSIS — E782 Mixed hyperlipidemia: Secondary | ICD-10-CM

## 2022-01-10 DIAGNOSIS — Z6834 Body mass index (BMI) 34.0-34.9, adult: Secondary | ICD-10-CM

## 2022-01-10 DIAGNOSIS — Z131 Encounter for screening for diabetes mellitus: Secondary | ICD-10-CM

## 2022-01-10 DIAGNOSIS — I10 Essential (primary) hypertension: Secondary | ICD-10-CM

## 2022-01-10 DIAGNOSIS — E6609 Other obesity due to excess calories: Secondary | ICD-10-CM

## 2022-01-10 LAB — POCT GLYCOSYLATED HEMOGLOBIN (HGB A1C): Hemoglobin A1C: 5.1 % (ref 4.0–5.6)

## 2022-01-10 MED ORDER — LOSARTAN POTASSIUM 100 MG PO TABS: 100.0000 mg | ORAL_TABLET | Freq: Every day | ORAL | 1 refills | Status: DC

## 2022-01-10 MED ORDER — MELATONIN 10 MG PO CAPS: 10.0000 mg | ORAL_CAPSULE | Freq: Every evening | ORAL | 1 refills | Status: DC | PRN

## 2022-01-10 MED ORDER — CARVEDILOL 25 MG PO TABS
25.0000 mg | ORAL_TABLET | Freq: Two times a day (BID) | ORAL | 1 refills | Status: DC
Start: 2022-01-10 — End: 2022-10-18

## 2022-01-10 MED ORDER — AMLODIPINE BESYLATE 10 MG PO TABS
10.0000 mg | ORAL_TABLET | Freq: Every day | ORAL | 1 refills | Status: DC
Start: 2022-01-10 — End: 2022-10-18

## 2022-01-10 NOTE — Patient Instructions (Addendum)
Calorie Counting for Weight Loss Calories are units of energy. Your body needs a certain number of calories from food to keep going throughout the day. When you eat or drink more calories than your body needs, your body stores the extra calories mostly as fat. When you eat or drink fewer calories than your body needs, your body burns fat to get the energy it needs. Calorie counting means keeping track of how many calories you eat and drink each day. Calorie counting can be helpful if you need to lose weight. If you eat fewer calories than your body needs, you should lose weight. Ask your health care provider what a healthy weight is for you. For calorie counting to work, you will need to eat the right number of calories each day to lose a healthy amount of weight per week. A dietitian can help you figure out how many calories you need in a day and will suggest ways to reach your calorie goal. A healthy amount of weight to lose each week is usually 1-2 lb (0.5-0.9 kg). This usually means that your daily calorie intake should be reduced by 500-750 calories. Eating 1,200-1,500 calories a day can help most women lose weight. Eating 1,500-1,800 calories a day can help most men lose weight. What do I need to know about calorie counting? Work with your health care provider or dietitian to determine how many calories you should get each day. To meet your daily calorie goal, you will need to: Find out how many calories are in each food that you would like to eat. Try to do this before you eat. Decide how much of the food you plan to eat. Keep a food log. Do this by writing down what you ate and how many calories it had. To successfully lose weight, it is important to balance calorie counting with a healthy lifestyle that includes regular activity. Where do I find calorie information?  The number of calories in a food can be found on a Nutrition Facts label. If a food does not have a Nutrition Facts label, try  to look up the calories online or ask your dietitian for help. Remember that calories are listed per serving. If you choose to have more than one serving of a food, you will have to multiply the calories per serving by the number of servings you plan to eat. For example, the label on a package of bread might say that a serving size is 1 slice and that there are 90 calories in a serving. If you eat 1 slice, you will have eaten 90 calories. If you eat 2 slices, you will have eaten 180 calories. How do I keep a food log? After each time that you eat, record the following in your food log as soon as possible: What you ate. Be sure to include toppings, sauces, and other extras on the food. How much you ate. This can be measured in cups, ounces, or number of items. How many calories were in each food and drink. The total number of calories in the food you ate. Keep your food log near you, such as in a pocket-sized notebook or on an app or website on your mobile phone. Some programs will calculate calories for you and show you how many calories you have left to meet your daily goal. What are some portion-control tips? Know how many calories are in a serving. This will help you know how many servings you can have of a certain   food. Use a measuring cup to measure serving sizes. You could also try weighing out portions on a kitchen scale. With time, you will be able to estimate serving sizes for some foods. Take time to put servings of different foods on your favorite plates or in your favorite bowls and cups so you know what a serving looks like. Try not to eat straight from a food's packaging, such as from a bag or box. Eating straight from the package makes it hard to see how much you are eating and can lead to overeating. Put the amount you would like to eat in a cup or on a plate to make sure you are eating the right portion. Use smaller plates, glasses, and bowls for smaller portions and to prevent  overeating. Try not to multitask. For example, avoid watching TV or using your computer while eating. If it is time to eat, sit down at a table and enjoy your food. This will help you recognize when you are full. It will also help you be more mindful of what and how much you are eating. What are tips for following this plan? Reading food labels Check the calorie count compared with the serving size. The serving size may be smaller than what you are used to eating. Check the source of the calories. Try to choose foods that are high in protein, fiber, and vitamins, and low in saturated fat, trans fat, and sodium. Shopping Read nutrition labels while you shop. This will help you make healthy decisions about which foods to buy. Pay attention to nutrition labels for low-fat or fat-free foods. These foods sometimes have the same number of calories or more calories than the full-fat versions. They also often have added sugar, starch, or salt to make up for flavor that was removed with the fat. Make a grocery list of lower-calorie foods and stick to it. Cooking Try to cook your favorite foods in a healthier way. For example, try baking instead of frying. Use low-fat dairy products. Meal planning Use more fruits and vegetables. One-half of your plate should be fruits and vegetables. Include lean proteins, such as chicken, turkey, and fish. Lifestyle Each week, aim to do one of the following: 150 minutes of moderate exercise, such as walking. 75 minutes of vigorous exercise, such as running. General information Know how many calories are in the foods you eat most often. This will help you calculate calorie counts faster. Find a way of tracking calories that works for you. Get creative. Try different apps or programs if writing down calories does not work for you. What foods should I eat?  Eat nutritious foods. It is better to have a nutritious, high-calorie food, such as an avocado, than a food with  few nutrients, such as a bag of potato chips. Use your calories on foods and drinks that will fill you up and will not leave you hungry soon after eating. Examples of foods that fill you up are nuts and nut butters, vegetables, lean proteins, and high-fiber foods such as whole grains. High-fiber foods are foods with more than 5 g of fiber per serving. Pay attention to calories in drinks. Low-calorie drinks include water and unsweetened drinks. The items listed above may not be a complete list of foods and beverages you can eat. Contact a dietitian for more information. What foods should I limit? Limit foods or drinks that are not good sources of vitamins, minerals, or protein or that are high in unhealthy fats. These   include: Candy. Other sweets. Sodas, specialty coffee drinks, alcohol, and juice. The items listed above may not be a complete list of foods and beverages you should avoid. Contact a dietitian for more information. How do I count calories when eating out? Pay attention to portions. Often, portions are much larger when eating out. Try these tips to keep portions smaller: Consider sharing a meal instead of getting your own. If you get your own meal, eat only half of it. Before you start eating, ask for a container and put half of your meal into it. When available, consider ordering smaller portions from the menu instead of full portions. Pay attention to your food and drink choices. Knowing the way food is cooked and what is included with the meal can help you eat fewer calories. If calories are listed on the menu, choose the lower-calorie options. Choose dishes that include vegetables, fruits, whole grains, low-fat dairy products, and lean proteins. Choose items that are boiled, broiled, grilled, or steamed. Avoid items that are buttered, battered, fried, or served with cream sauce. Items labeled as crispy are usually fried, unless stated otherwise. Choose water, low-fat milk,  unsweetened iced tea, or other drinks without added sugar. If you want an alcoholic beverage, choose a lower-calorie option, such as a glass of wine or light beer. Ask for dressings, sauces, and syrups on the side. These are usually high in calories, so you should limit the amount you eat. If you want a salad, choose a garden salad and ask for grilled meats. Avoid extra toppings such as bacon, cheese, or fried items. Ask for the dressing on the side, or ask for olive oil and vinegar or lemon to use as dressing. Estimate how many servings of a food you are given. Knowing serving sizes will help you be aware of how much food you are eating at restaurants. Where to find more information Centers for Disease Control and Prevention: www.cdc.gov [place] Department of Agriculture: myplate.gov Summary Calorie counting means keeping track of how many calories you eat and drink each day. If you eat fewer calories than your body needs, you should lose weight. A healthy amount of weight to lose per week is usually 1-2 lb (0.5-0.9 kg). This usually means reducing your daily calorie intake by 500-750 calories. The number of calories in a food can be found on a Nutrition Facts label. If a food does not have a Nutrition Facts label, try to look up the calories online or ask your dietitian for help. Use smaller plates, glasses, and bowls for smaller portions and to prevent overeating. Use your calories on foods and drinks that will fill you up and not leave you hungry shortly after a meal. This information is not intended to replace advice given to you by your health care provider. Make sure you discuss any questions you have with your health care provider. Document Revised: 06/04/2019 Document Reviewed: 06/04/2019 [employer] Patient Education  2023 [employer] Insomnia Insomnia is a sleep disorder that makes it difficult to fall asleep or stay asleep. Insomnia can cause fatigue, low energy, difficulty concentrating,  mood swings, and poor performance at work or school. There are three different ways to classify insomnia: Difficulty falling asleep. Difficulty staying asleep. Waking up too early in the morning. Any type of insomnia can be long-term (chronic) or short-term (acute). Both are common. Short-term insomnia usually lasts for 3 months or less. Chronic insomnia occurs at least three times a week for longer than 3 months. What are   the causes? Insomnia may be caused by another condition, situation, or substance, such as: Having certain mental health conditions, such as anxiety and depression. Using caffeine, alcohol, tobacco, or drugs. Having gastrointestinal conditions, such as gastroesophageal reflux disease (GERD). Having certain medical conditions. These include: Asthma. Alzheimer's disease. Stroke. Chronic pain. An overactive thyroid gland (hyperthyroidism). Other sleep disorders, such as restless legs syndrome and sleep apnea. Menopause. Sometimes, the cause of insomnia may not be known. What increases the risk? Risk factors for insomnia include: Gender. Females are affected more often than males. Age. Insomnia is more common as people get older. Stress and certain medical and mental health conditions. Lack of exercise. Having an irregular work schedule. This may include working night shifts and traveling between different time zones. What are the signs or symptoms? If you have insomnia, the main symptom is having trouble falling asleep or having trouble staying asleep. This may lead to other symptoms, such as: Feeling tired or having low energy. Feeling nervous about going to sleep. Not feeling rested in the morning. Having trouble concentrating. Feeling irritable, anxious, or depressed. How is this diagnosed? This condition may be diagnosed based on: Your symptoms and medical history. Your health care provider may ask about: Your sleep habits. Any medical conditions you  have. Your mental health. A physical exam. How is this treated? Treatment for insomnia depends on the cause. Treatment may focus on treating an underlying condition that is causing the insomnia. Treatment may also include: Medicines to help you sleep. Counseling or therapy. Lifestyle adjustments to help you sleep better. Follow these instructions at home: Eating and drinking  Limit or avoid alcohol, caffeinated beverages, and products that contain nicotine and tobacco, especially close to bedtime. These can disrupt your sleep. Do not eat a large meal or eat spicy foods right before bedtime. This can lead to digestive discomfort that can make it hard for you to sleep. Sleep habits  Keep a sleep diary to help you and your health care provider figure out what could be causing your insomnia. Write down: When you sleep. When you wake up during the night. How well you sleep and how rested you feel the next day. Any side effects of medicines you are taking. What you eat and drink. Make your bedroom a dark, comfortable place where it is easy to fall asleep. Put up shades or blackout curtains to block light from outside. Use a white noise machine to block noise. Keep the temperature cool. Limit screen use before bedtime. This includes: Not watching TV. Not using your smartphone, tablet, or computer. Stick to a routine that includes going to bed and waking up at the same times every day and night. This can help you fall asleep faster. Consider making a quiet activity, such as reading, part of your nighttime routine. Try to avoid taking naps during the day so that you sleep better at night. Get out of bed if you are still awake after 15 minutes of trying to sleep. Keep the lights down, but try reading or doing a quiet activity. When you feel sleepy, go back to bed. General instructions Take over-the-counter and prescription medicines only as told by your health care provider. Exercise regularly  as told by your health care provider. However, avoid exercising in the hours right before bedtime. Use relaxation techniques to manage stress. Ask your health care provider to suggest some techniques that may work well for you. These may include: Breathing exercises. Routines to release muscle tension. Visualizing peaceful   scenes. Make sure that you drive carefully. Do not drive if you feel very sleepy. Keep all follow-up visits. This is important. Contact a health care provider if: You are tired throughout the day. You have trouble in your daily routine due to sleepiness. You continue to have sleep problems, or your sleep problems get worse. Get help right away if: You have thoughts about hurting yourself or someone else. Get help right away if you feel like you may hurt yourself or others, or have thoughts about taking your own life. Go to your nearest emergency room or: Call 911. Call the National Suicide Prevention Lifeline at 1-800-273-8255 or 988. This is open 24 hours a day. Text the Crisis Text Line at 741741. Summary Insomnia is a sleep disorder that makes it difficult to fall asleep or stay asleep. Insomnia can be long-term (chronic) or short-term (acute). Treatment for insomnia depends on the cause. Treatment may focus on treating an underlying condition that is causing the insomnia. Keep a sleep diary to help you and your health care provider figure out what could be causing your insomnia. This information is not intended to replace advice given to you by your health care provider. Make sure you discuss any questions you have with your health care provider. Document Revised: 04/03/2021 Document Reviewed: 04/03/2021 [employer] Patient Education  2023 [employer]  

## 2022-01-10 NOTE — Progress Notes (Signed)
[name] is a 61 y.o. male presents for hypertension evaluation, Denies shortness of breath, headaches, chest pain or lower extremity edema, sudden onset, vision changes, unilateral weakness, dizziness, paresthesias   Patient reports adherence with medications.  Dietary habits include: monitoring sodium and carbs Exercise habits include:walks states will increase Family / Social history: None   Past Medical History:  Diagnosis Date   Alcohol abuse    Allergy    Anxiety    Arthritis    Diverticulosis of colon (without mention of hemorrhage)    GERD (gastroesophageal reflux disease)    Gout    History of kidney stones    Hyperlipemia    Hypertension    dr Ronalee Belts  norins   Kidney stones    Nephrolithiasis    Pancreatitis    Hx of    Ureteral stent displacement ([place]) 1990   Past Surgical History:  Procedure Laterality Date   A2 pulley flexor sheath cyst 4th finger excisional biopsy     '03   ANTERIOR CRUCIATE LIGAMENT REPAIR     R knee   ESOPHAGOGASTRODUODENOSCOPY N/A 07/09/2012   Procedure: ESOPHAGOGASTRODUODENOSCOPY (EGD);  Surgeon: Inda Castle, MD;  Location: [facility];  Service: Endoscopy;  Laterality: N/A;   KNEE SURGERY  2008   LEFT HEART CATH AND CORONARY ANGIOGRAPHY N/A 08/10/2019   Procedure: LEFT HEART CATH AND CORONARY ANGIOGRAPHY;  Surgeon: Lorretta Harp, MD;  Location: [facility];  Service: Cardiovascular;  Laterality: N/A;   SHOULDER ARTHROSCOPY WITH SUBACROMIAL DECOMPRESSION AND OPEN ROTATOR C Right 12/26/2012   Procedure: RIGHT SHOULDER ARTHROSCOPY WITH SUBACROMIAL DECOMPRESSION MINI OPEN ROTATOR CUFF REPAIR, OPEN BICEP TENODESIS OPEN DISTAL CLAVICLE RESECTION  ;  Surgeon: Augustin Schooling, MD;  Location: [facility];  Service: Orthopedics;  Laterality: Right;   TRANSFORAMINAL LUMBAR INTERBODY FUSION (TLIF) WITH PEDICLE SCREW FIXATION 1 LEVEL Right 03/27/2018   Procedure: RIGHT-SIDED LUMBAR 4-5 TRANSFORAMINAL LUMBAR  INTERBODY FUSION WITH INSTRUMENTATION AND ALLOGRAFT;  Surgeon: Phylliss Bob, MD;  Location: [facility];  Service: Orthopedics;  Laterality: Right;   URETEROSCOPY     and stone retreval   Allergies  Allergen Reactions   Hydrocodone-Acetaminophen Palpitations   Shrimp [Shellfish Allergy] Other (See Comments)    Cholesterol level becomes very high. Patient reports sweating and itching.   Current Outpatient Medications on File Prior to Visit  Medication Sig Dispense Refill   atorvastatin (LIPITOR) 40 MG tablet Take 1 tablet by mouth once daily 90 tablet 3   cyclobenzaprine (FLEXERIL) 10 MG tablet Take 1 tablet by mouth three times daily as needed for muscle spasm 60 tablet 1   Multiple Vitamins-Minerals (MULTIVITAMIN WITH MINERALS) tablet Take 1 tablet by mouth daily.     omeprazole (PRILOSEC) 40 MG capsule Take 40 mg by mouth daily.     oxyCODONE-acetaminophen (PERCOCET) 10-325 MG tablet Take 1 tablet by mouth 3 (three) times daily as needed.     potassium chloride SA (KLOR-CON M) 20 MEQ tablet Take 1 tablet (20 mEq total) by mouth daily. 14 tablet 0   Rimegepant Sulfate (NURTEC) 75 MG TBDP Take 75 mg by mouth daily as needed. 8 tablet 6   rizatriptan (MAXALT-MLT) 10 MG disintegrating tablet Take 1 tablet (10 mg total) by mouth as needed for migraine. May repeat in 2 hours if needed 9 tablet 11   Tetrahydrozoline HCl (VISINE OP) Place 1 drop into both eyes daily as needed (redness).     triamcinolone cream (KENALOG) 0.1 % Apply  1 application topically 2 (two) times daily. (Patient taking differently: Apply 1 application  topically as needed.) 60 g 0   [DISCONTINUED] diphenhydrAMINE (BENADRYL) 25 MG tablet Take 1 tablet (25 mg total) by mouth every 6 (six) hours as needed. 30 tablet 0   [DISCONTINUED] metoCLOPramide (REGLAN) 10 MG tablet Take 1 tablet (10 mg total) by mouth every 6 (six) hours as needed for nausea (nausea/headache). Take this tablet with 25 mg of Benadryl and two extra strength  Tylenol for headache. 12 tablet 0   No current facility-administered medications on file prior to visit.   Social History   Socioeconomic History   Marital status: Legally Separated    Spouse name: Not on file   Number of children: 3   Years of education: Not on file   Highest education level: Not on file  Occupational History    Employer: [employer]    Comment: Estate agent , retired  Tobacco Use   Smoking status: Former    Types: Cigarettes    Quit date: 07/10/1998    Years since quitting: 23.5   Smokeless tobacco: Never  Vaping Use   Vaping Use: Never used  Substance and Sexual Activity   Alcohol use: Yes    Alcohol/week: 6.0 standard drinks of alcohol    Types: 6 Cans of beer per week   Drug use: No   Sexual activity: Yes  Other Topics Concern   Not on file  Social History Narrative   HSG   [employer] groundskeeper   Difficult domestic situation, Married '87- separated '01   2 sons- '82, '91; 1 daughter '91   His niece is 17 with metastatic breast cancer (feb '12)   Social Determinants of Health   Financial Resource Strain: Not on file  Food Insecurity: Not on file  Transportation Needs: Not on file  Physical Activity: Not on file  Stress: Not on file  Social Connections: Not on file  Intimate Partner Violence: Not on file   Family History  Problem Relation Age of Onset   Diabetes Mother        x2 brothers   Hypertension Father        X4 siblings   Heart disease Father    Colon cancer Neg Hx    Esophageal cancer Neg Hx      OBJECTIVE:  Vitals:   01/10/22 0921  BP: 117/81  Pulse: 89  Temp: 98.1 F (36.7 C)  TempSrc: Oral  SpO2: 94%  Weight: 254 lb (115.2 kg)  Height: 6' (1.829 m)    Physical Exam Vitals reviewed.  Constitutional:      Appearance: He is obese.  HENT:     Head: Normocephalic.     Right Ear: Tympanic membrane and external ear normal.     Left Ear: Tympanic membrane and external ear normal.     Nose: Nose normal.   Eyes:     Extraocular Movements: Extraocular movements intact.     Pupils: Pupils are equal, round, and reactive to light.  Cardiovascular:     Rate and Rhythm: Normal rate and regular rhythm.  Pulmonary:     Effort: Pulmonary effort is normal.     Breath sounds: Normal breath sounds.  Abdominal:     General: Bowel sounds are normal. There is distension.     Palpations: Abdomen is soft.  Musculoskeletal:        General: Normal range of motion.     Cervical back: Normal range of  motion and neck supple.  Skin:    General: Skin is warm and dry.  Neurological:     Mental Status: He is alert and oriented to person, place, and time.  Psychiatric:        Mood and Affect: Mood normal.        Behavior: Behavior normal.        Thought Content: Thought content normal.    ROS Comprehensive ROS Pertinent positive and negative noted in HPI   Last 3 Office BP readings: BP Readings from Last 3 Encounters:  01/10/22 117/81  11/08/21 123/80  10/05/21 124/81    BMET    Component Value Date/Time   NA 140 10/12/2021 1429   NA 138 06/17/2020 2020   K 3.8 10/12/2021 1429   CL 104 10/12/2021 1429   CO2 27 10/12/2021 1429   GLUCOSE 100 (H) 10/12/2021 1429   BUN 11 10/12/2021 1429   BUN 11 06/17/2020 2020   CREATININE 1.15 10/12/2021 1429   CALCIUM 9.2 10/12/2021 1429   GFRNONAA 55 (L) 09/14/2021 1544   GFRAA 67 06/17/2020 2020    Renal function: CrCl cannot be calculated (Patient's most recent lab result is older than the maximum 21 days allowed.).  Clinical ASCVD: No  The 10-year ASCVD risk score (Arnett DK, et al., 2019) is: 9%   Values used to calculate the score:     Age: 50 years     Sex: Male     Is Non-Hispanic African American: Yes     Diabetic: No     Tobacco smoker: No     Systolic Blood Pressure: 664 mmHg     Is BP treated: Yes     HDL Cholesterol: 67 mg/dL     Total Cholesterol: 137 mg/dL  ASCVD risk factors include- [place]   ASSESSMENT & PLAN: Knowledge was seen  today for hypertension.  Diagnoses and all orders for this visit:  Screening for diabetes mellitus -     HgB A1c 5.1 per ADA guidelines diabetic or pre-  Essential hypertension Blood pressures well controlled less than 130,/ 80 years adherent with medication monitoring sodium intake and carbs.  Needs to increase exercising. Counseled on lifestyle modifications for blood pressure control including reduced dietary sodium, increased exercise, weight reduction and adequate sleep. Also, educated patient about the risk for cardiovascular events, stroke and heart attack. Also counseled patient about the importance of medication adherence. If you participate in smoking, it is important to stop using tobacco as this will increase the risks associated with uncontrolled blood pressure.    -     carvedilol (COREG) 25 MG tablet; Take 1 tablet (25 mg total) by mouth 2 (two) times daily with a meal. -     CMP14+EGFR  Medication refill -     losartan (COZAAR) 100 MG tablet; Take 1 tablet (100 mg total) by mouth daily. -     carvedilol (COREG) 25 MG tablet; Take 1 tablet (25 mg total) by mouth 2 (two) times daily with a meal. -     amLODipine (NORVASC) 10 MG tablet; Take 1 tablet (10 mg total) by mouth daily.  Mixed hyperlipidemia  Healthy lifestyle diet of fruits vegetables fish nuts whole grains and low saturated fat . Foods high in cholesterol or liver, fatty meats,cheese, butter avocados, nuts and seeds, chocolate and fried foods. -     Lipid Panel  Insomnia, unspecified type Information placed on AVS for insomnia -     Melatonin 10 MG CAPS; Take  10 mg by mouth at bedtime as needed.   Class 1 obesity due to excess calories with serious comorbidity and body mass index (BMI) of 34.0 to 34.9 in adult Obesity is a BMI greater than 30.  Counseled on the importance of diet and exercise and achieving weight loss.  Agreed upon 8 to 10 pounds loss by follow-up visit.  Patient plans on walking daily and making  better food choices.  Also discussed monitoring carbohydrates cookies, candies, grits, rice, macaroni and cheese, cakes and pies  Meds ordered this encounter  Medications   losartan (COZAAR) 100 MG tablet    Sig: Take 1 tablet (100 mg total) by mouth daily.    Dispense:  90 tablet    Refill:  1    Order Specific Question:   Supervising Provider    Answer:   Tresa Garter [[id number]]   carvedilol (COREG) 25 MG tablet    Sig: Take 1 tablet (25 mg total) by mouth 2 (two) times daily with a meal.    Dispense:  180 tablet    Refill:  1    Order Specific Question:   Supervising Provider    Answer:   Tresa Garter [[id number]]   amLODipine (NORVASC) 10 MG tablet    Sig: Take 1 tablet (10 mg total) by mouth daily.    Dispense:  90 tablet    Refill:  1    Order Specific Question:   Supervising Provider    Answer:   Tresa Garter [id number]   Melatonin 10 MG CAPS    Sig: Take 10 mg by mouth at bedtime as needed.    Dispense:  90 capsule    Refill:  1    Order Specific Question:   Supervising Provider    Answer:   Tresa Garter [[id number]]     This note has been created with Dragon speech recognition software and smart phrase technology. Any transcriptional errors are unintentional.   [name], NP 01/10/2022, 3:22 PM

## 2022-01-11 LAB — CMP14+EGFR
ALT: 20 IU/L (ref 0–44)
AST: 23 IU/L (ref 0–40)
Albumin/Globulin Ratio: 1.9 (ref 1.2–2.2)
Albumin: 5 g/dL — ABNORMAL HIGH (ref 3.8–4.9)
Alkaline Phosphatase: 48 IU/L (ref 44–121)
BUN/Creatinine Ratio: 17 (ref 9–20)
BUN: 19 mg/dL (ref 6–24)
Bilirubin Total: 0.8 mg/dL (ref 0.0–1.2)
CO2: 20 mmol/L (ref 20–29)
Calcium: 8.6 mg/dL — ABNORMAL LOW (ref 8.7–10.2)
Chloride: 103 mmol/L (ref 96–106)
Creatinine, Ser: 1.12 mg/dL (ref 0.76–1.27)
Globulin, Total: 2.6 g/dL (ref 1.5–4.5)
Glucose: 147 mg/dL — ABNORMAL HIGH (ref 70–99)
Potassium: 3.4 mmol/L — ABNORMAL LOW (ref 3.5–5.2)
Sodium: 143 mmol/L (ref 134–144)
Total Protein: 7.6 g/dL (ref 6.0–8.5)
eGFR: 76 mL/min/{1.73_m2} (ref >59)

## 2022-01-11 LAB — LIPID PANEL
Chol/HDL Ratio: 2.6 ratio (ref 0.0–5.0)
Cholesterol, Total: 140 mg/dL (ref 100–199)
HDL: 53 mg/dL (ref >39)
LDL Chol Calc (NIH): 19 mg/dL (ref 0–99)
Triglycerides: 511 mg/dL — ABNORMAL HIGH (ref 0–149)
VLDL Cholesterol Cal: 68 mg/dL — ABNORMAL HIGH (ref 5–40)

## 2022-05-16 ENCOUNTER — Ambulatory Visit (INDEPENDENT_AMBULATORY_CARE_PROVIDER_SITE_OTHER): Payer: 59 | Admitting: Primary Care

## 2022-06-05 ENCOUNTER — Other Ambulatory Visit (INDEPENDENT_AMBULATORY_CARE_PROVIDER_SITE_OTHER): Payer: Self-pay

## 2022-06-05 ENCOUNTER — Telehealth (INDEPENDENT_AMBULATORY_CARE_PROVIDER_SITE_OTHER): Payer: Self-pay

## 2022-06-05 DIAGNOSIS — Z760 Encounter for issue of repeat prescription: Secondary | ICD-10-CM

## 2022-06-05 NOTE — Telephone Encounter (Signed)
Error

## 2022-06-05 NOTE — Telephone Encounter (Signed)
Received rx request from [facility] in regards to pt Amlodipine requesting 90 day supply. Unfortunately wouldn't be able to send rx for 90 days can only send in rx for 30 days pt is past due for an appt with Sharyn Lull. Pt had an appt scheduled for 05/16/22 and no showed. Tried contacting pt to explain pt didn't answer lvm asking pt to give a call back. Want to confirm pharmacy and schedule a follow up appt

## 2022-06-07 ENCOUNTER — Other Ambulatory Visit: Payer: Self-pay

## 2022-06-07 NOTE — Telephone Encounter (Signed)
Received a refill request for pt's Omeprazole '40mg'$  from mail in pharmacy. Last office visit 02-28-21, so he is overdue for his appt. Discussed with pt. Made appointment 07-13-22. Will refill till then. Pt would like this refill to his [facility]. 90 day refill sent. Looks like this was rx'd by PCP. Will forward

## 2022-06-19 ENCOUNTER — Encounter (INDEPENDENT_AMBULATORY_CARE_PROVIDER_SITE_OTHER): Payer: Self-pay | Admitting: Primary Care

## 2022-06-19 ENCOUNTER — Ambulatory Visit (INDEPENDENT_AMBULATORY_CARE_PROVIDER_SITE_OTHER): Payer: Commercial Managed Care - HMO | Admitting: Primary Care

## 2022-06-19 ENCOUNTER — Telehealth: Payer: Self-pay

## 2022-06-19 VITALS — BP 110/68 | HR 81 | Resp 16 | Ht 72.0 in | Wt 258.2 lb

## 2022-06-19 DIAGNOSIS — I10 Essential (primary) hypertension: Secondary | ICD-10-CM | POA: Diagnosis not present

## 2022-06-19 DIAGNOSIS — F1010 Alcohol abuse, uncomplicated: Secondary | ICD-10-CM | POA: Diagnosis not present

## 2022-06-19 DIAGNOSIS — E782 Mixed hyperlipidemia: Secondary | ICD-10-CM | POA: Diagnosis not present

## 2022-06-19 NOTE — Patient Instructions (Addendum)
please add this info to Mr Hiegel's AVS.  I gave him the info and he put it in his phone :   [facility])  [place]: [phone] Pester , Jamelle HaringKT:[phone] and [facility] [place]: [phone]   Shingles  Shingles is an infection. It gives you a painful skin rash and blisters that have fluid in them. Shingles is caused by the same germ (virus) that causes chickenpox. Shingles only happens in people who: Have had chickenpox. Have been given a shot (vaccine) to protect against chickenpox. Shingles is rare in this group. What are the causes? This condition is caused by varicella-zoster virus. This is the same germ that causes chickenpox. After a person is exposed to the germ, the germ stays in the body but is not active (dormant). Shingles develops if the germ becomes active again (is reactivated). This can happen many years after the first exposure to the germ. It is not known what causes this germ to become active again. What increases the risk? People who have had chickenpox or received the chickenpox shot are at risk for shingles. This infection is more common in people who: Are older than 61 years of age. Have a weakened disease-fighting system (immune system), such as people with: HIV (human immunodeficiency virus). AIDS (acquired immunodeficiency syndrome). Cancer. Are taking medicines that weaken the immune system, such as organ transplant medicines. Have a lot of stress. What are the signs or symptoms? The first symptoms of shingles may be itching, tingling, or pain in an area on your skin. A rash will show on your skin a few days or weeks later. This is what usually happens: The rash is likely to be on one side of your body. The rash usually has a shape like a belt or a band. Over time, the rash turns into fluid-filled blisters. The blisters will break open and change into scabs. The scabs usually dry up in about 2-3  weeks. You may also have: A fever. Chills. A headache. A feeling like you may vomit (nausea). How is this treated? The rash may last for several weeks. There is not a specific cure for this condition. Your doctor may prescribe medicines. Medicines may: Help with pain. Help you get better sooner. Help to prevent long-term problems. Help with itching (antihistamines). If the area involved is on your face, you may need to see a specialist. This may be an eye doctor or an ear, nose, and throat (ENT) doctor. Follow these instructions at home: Medicines Take over-the-counter and prescription medicines only as told by your doctor. Put on an anti-itch cream or numbing cream where you have a rash, blisters, or scabs. Do this as told by your doctor. Helping with itching and discomfort  Put cold, wet cloths (cold compresses) on the area of the rash or blisters as told by your doctor. Cool baths can help you feel better. Try adding baking soda or dry oatmeal to the water to lessen itching. Do not bathe in hot water. Use calamine lotion as told by your doctor. Blister and rash care Keep your rash covered with a loose bandage (dressing). Wear loose clothing that does not rub on your rash. Wash your hands with soap and water for at least 20 seconds before and after you change your bandage. If you cannot use soap and water, use hand sanitizer. Change your bandage as told by your doctor. Keep your rash and blisters clean. To do this, wash the area  with mild soap and cool water as told by your doctor. Check your rash every day for signs of infection. Check for: More redness, swelling, or pain. Fluid or blood. Warmth. Pus or a bad smell. Do not scratch your rash. Do not pick at your blisters. To help you to not scratch: Keep your fingernails clean and cut short. Wear gloves or mittens when you sleep, if scratching is a problem. General instructions Rest as told by your doctor. Wash your hands  often with soap and water for at least 20 seconds. If you cannot use soap and water, use hand sanitizer. Doing this lowers your chance of getting a skin infection. Your infection can cause chickenpox in people who have never had chickenpox or never got a chickenpox vaccine shot. If you have blisters that did not change into scabs yet, try not to touch other people or be around other people, especially: Babies. Pregnant women. Children who have areas of red, itchy, or rough skin (eczema). Older people who have organ transplants. People who have a long-term (chronic) illness, like cancer or AIDS. Keep all follow-up visits. How is this prevented? A vaccine shot is the best way to prevent shingles and protect against shingles problems. If you have not had a vaccine shot, talk with your doctor about getting it. Where to find more information Centers for Disease Control and Prevention: http://www.wolf.info/ Contact a doctor if: Your pain does not get better with medicine. Your pain does not get better after the rash heals. You have any of these signs of infection around the rash: More redness, swelling, or pain. Fluid or blood. Warmth. Pus or a bad smell. You have a fever. Get help right away if: The rash is on your face or nose. You have pain in your face or pain by your eye. You lose feeling on one side of your face. You have trouble seeing. You have ear pain, or you have ringing in your ear. You have a loss of taste. Your condition gets worse. Summary Shingles gives you a painful skin rash and blisters that have fluid in them. Shingles is caused by the same germ (virus) that causes chickenpox. Keep your rash covered with a loose bandage. Wear loose clothing that does not rub on your rash. If you have blisters that did not change into scabs yet, try not to touch other people or be around people. This information is not intended to replace advice given to you by your health care provider. Make  sure you discuss any questions you have with your health care provider. Document Revised: 04/18/2020 Document Reviewed: 04/18/2020 Elsevier Patient Education  [place].

## 2022-06-19 NOTE — Telephone Encounter (Signed)
I spoke with the patient when he was at his appointment with Jodene Nam, NP today. He explained that he is ready to address his alcohol use and would like residential treatment. He confirmed that he will need to detox first and would prefer to go directly from detox to treatment.  He would like to have some type of residential treatment after the initial 30 days. I told him that he can speak to him treatment team about plans/ support after the 30  days of inpatient care.  I provided him with the phone numbers for:  [facility])  [place]: [phone] Fabio [id number] [place] ([place]), [place]: [phone]  He said his daughter has been trying to help him get into treatment and he will share this information with her.   He is insured with [employer] and is currently working with an attorney to apply for Medicaid , disability.   I told him to call me if he has any questions/ needs further assistance

## 2022-06-19 NOTE — Progress Notes (Signed)
[name], is a 62 y.o. male  [id number]  [id number]  DOB - [birth year]  Chief Complaint  Patient presents with   Hypertension       Subjective:   [name] is a 63 y.o. male here today for a follow up visit for Bp.  Patient has No headache, No chest pain, No abdominal pain - No Nausea, No new weakness tingling or numbness, No Cough - shortness of breath. Bp has been well controled  on Losartan 100 mg without side effects for hypotension. Patient wants to go to alcohol rehab for however long it takes. Added Clinical nurse manage to merge conversation and direction for help provided on AVS. Patient has No headache, No chest pain, No abdominal pain - No Nausea, No new weakness tingling or numbness, No Cough - shortness of breath   No problems updated.  Allergies  Allergen Reactions   Hydrocodone-Acetaminophen Palpitations   Shrimp [Shellfish Allergy] Other (See Comments)    Cholesterol level becomes very high. Patient reports sweating and itching.    Past Medical History:  Diagnosis Date   Alcohol abuse    Allergy    Anxiety    Arthritis    Diverticulosis of colon (without mention of hemorrhage)    GERD (gastroesophageal reflux disease)    Gout    History of kidney stones    Hyperlipemia    Hypertension    dr Ronalee Belts  norins   Kidney stones    Nephrolithiasis    Pancreatitis    Hx of    Ureteral stent displacement ([place]) 1990    Current Outpatient Medications on File Prior to Visit  Medication Sig Dispense Refill   amLODipine (NORVASC) 10 MG tablet Take 1 tablet (10 mg total) by mouth daily. 90 tablet 1   atorvastatin (LIPITOR) 40 MG tablet Take 1 tablet by mouth once daily 90 tablet 3   carvedilol (COREG) 25 MG tablet Take 1 tablet (25 mg total) by mouth 2 (two) times daily with a meal. 180 tablet 1   cyclobenzaprine (FLEXERIL) 10 MG tablet Take 1 tablet by mouth three times daily as needed for muscle spasm 60 tablet 1    losartan (COZAAR) 100 MG tablet Take 1 tablet (100 mg total) by mouth daily. 90 tablet 1   Melatonin 10 MG CAPS Take 10 mg by mouth at bedtime as needed. 90 capsule 1   Multiple Vitamins-Minerals (MULTIVITAMIN WITH MINERALS) tablet Take 1 tablet by mouth daily.     omeprazole (PRILOSEC) 40 MG capsule Take 40 mg by mouth daily.     oxyCODONE-acetaminophen (PERCOCET) 10-325 MG tablet Take 1 tablet by mouth 3 (three) times daily as needed.     potassium chloride SA (KLOR-CON M) 20 MEQ tablet Take 1 tablet (20 mEq total) by mouth daily. 14 tablet 0   Rimegepant Sulfate (NURTEC) 75 MG TBDP Take 75 mg by mouth daily as needed. 8 tablet 6   rizatriptan (MAXALT-MLT) 10 MG disintegrating tablet Take 1 tablet (10 mg total) by mouth as needed for migraine. May repeat in 2 hours if needed 9 tablet 11   Tetrahydrozoline HCl (VISINE OP) Place 1 drop into both eyes daily as needed (redness).     triamcinolone cream (KENALOG) 0.1 % Apply 1 application topically 2 (two) times daily. (Patient taking differently: Apply 1 application  topically as needed.) 60 g 0   [DISCONTINUED] diphenhydrAMINE (BENADRYL) 25 MG tablet Take 1 tablet (25 mg total) by mouth every 6 (six)  hours as needed. 30 tablet 0   [DISCONTINUED] metoCLOPramide (REGLAN) 10 MG tablet Take 1 tablet (10 mg total) by mouth every 6 (six) hours as needed for nausea (nausea/headache). Take this tablet with 25 mg of Benadryl and two extra strength Tylenol for headache. 12 tablet 0   No current facility-administered medications on file prior to visit.    Objective:   Vitals:   06/19/22 1424 06/19/22 1448  BP: 103/65 110/68  Pulse: 81   Resp: 16   SpO2: 96%   Weight: 258 lb 3.2 oz (117.1 kg)   Height: 6' (1.829 m)     Comprehensive ROS Pertinent positive and negative noted in HPI   Exam General appearance : Awake, alert, not in any distress. Obese male .Speech Clear. Not toxic looking HEENT: Atraumatic and Normocephalic, pupils equally reactive  to light and accomodation Neck: Supple, no JVD. No cervical lymphadenopathy.  Chest: Good air entry bilaterally, no added sounds  CVS: S1 S2 regular, no murmurs.  Abdomen: Bowel sounds present, Non tender and not distended with no gaurding, rigidity or rebound. Extremities: B/L Lower Ext shows no edema, both legs are warm to touch Neurology: Awake alert, and oriented X 3, CN II-XII intact, Non focal Skin: No Rash  Data Review Lab Results  Component Value Date   HGBA1C 5.1 01/10/2022   HGBA1C 5.3 09/08/2019   HGBA1C 5.4 03/02/2014      [name] was seen today for hypertension.  Diagnoses and all orders for this visit:  Essential hypertension Bp has been well controled considering lowering dose of Losartan . Patient denies any hypotension side effects. He feels fine. Monitor closely until f/u with cardiology DASH diet followed and exorcising -     CMP14+EGFR; Future  Mixed hyperlipidemia  Healthy lifestyle diet of fruits vegetables fish nuts whole grains and low saturated fat . Foods high in cholesterol or liver, fatty meats,cheese, butter avocados, nuts and seeds, chocolate and fried foods. -     Lipid panel; Future  Alcohol abuse AVS provides resources    Patient have been counseled extensively about nutrition and exercise. Other issues discussed during this visit include: low cholesterol diet, weight control and daily exercise, foot care, annual eye examinations at Ophthalmology, importance of adherence with medications and regular follow-up. We also discussed long term complications of uncontrolled diabetes and hypertension.   Return in about 3 months (around 09/17/2022) for after rehab.  The patient was given clear instructions to go to ER or return to medical center if symptoms don't improve, worsen or new problems develop. The patient verbalized understanding. The patient was told to call to get lab results if they haven't heard anything in the next week.   This note has  been created with Surveyor, quantity. Any transcriptional errors are unintentional.   [name], NP 06/19/2022, 9:46 PM

## 2022-06-20 ENCOUNTER — Ambulatory Visit (INDEPENDENT_AMBULATORY_CARE_PROVIDER_SITE_OTHER): Payer: Self-pay

## 2022-06-20 NOTE — Telephone Encounter (Signed)
  Chief Complaint: medication problem  Symptoms: NA Frequency: today  Pertinent Negatives: NA Disposition: '[]'$ ED /'[]'$ Urgent Care (no appt availability in office) / '[]'$ Appointment(In office/virtual)/ '[]'$  [facility]/ '[]'$ Home Care/ '[]'$ Refused Recommended Disposition /'[]'$ [facility]/ '[x]'$  Follow-up with PCP Additional Notes: pt calling to find out if dose of losartan '100mg'$  was going to be lowered or dc'd. Pt wasn't yesterday after OV. Reviewed OV note and new dosage was sent to advised pt I would send message to provider and FU with him tomorrow. Pt verbalized understanding. Would like CB if no answer please LVM.   Summary: Medication question, discontinue?   Pt needs to know whether he needs to discontinue taking his Losartan following his appt yesterday. He says his PCP was not clear about whether he should stop taking this medication.       Reason for Disposition . [1] Caller has URGENT medicine question about med that PCP or specialist prescribed AND [2] triager unable to answer question  Answer Assessment - Initial Assessment Questions 1. NAME of MEDICINE: "What medicine(s) are you calling about?"     Losartan '100mg'$   2. QUESTION: "What is your question?" (e.g., double dose of medicine, side effect)     Unsure if pt needs to discontinue or lower dose according to OV yesterday  3. PRESCRIBER: "Who prescribed the medicine?" Reason: if prescribed by specialist, call should be referred to that group.     Michelle 4. SYMPTOMS: "Do you have any symptoms?" If Yes, ask: "What symptoms are you having?"  "How bad are the symptoms (e.g., mild, moderate, severe)     NA  Protocols used: Medication Question Call-A-AH

## 2022-06-21 ENCOUNTER — Telehealth (INDEPENDENT_AMBULATORY_CARE_PROVIDER_SITE_OTHER): Payer: Self-pay | Admitting: Primary Care

## 2022-06-21 NOTE — Telephone Encounter (Signed)
Will forward to provider  

## 2022-06-21 NOTE — Telephone Encounter (Signed)
Pt stated he hasn't heard back from provider about if he is suppose to stop taking Losartan after his appt yesterday/ please advise

## 2022-06-25 ENCOUNTER — Other Ambulatory Visit (INDEPENDENT_AMBULATORY_CARE_PROVIDER_SITE_OTHER): Payer: Commercial Managed Care - HMO

## 2022-06-25 DIAGNOSIS — I10 Essential (primary) hypertension: Secondary | ICD-10-CM | POA: Diagnosis not present

## 2022-06-25 DIAGNOSIS — E782 Mixed hyperlipidemia: Secondary | ICD-10-CM

## 2022-06-26 LAB — CMP14+EGFR
ALT: 18 IU/L (ref 0–44)
AST: 23 IU/L (ref 0–40)
Albumin/Globulin Ratio: 1.9 (ref 1.2–2.2)
Albumin: 4.6 g/dL (ref 3.8–4.9)
Alkaline Phosphatase: 42 IU/L — ABNORMAL LOW (ref 44–121)
BUN/Creatinine Ratio: 19 (ref 10–24)
BUN: 18 mg/dL (ref 8–27)
Bilirubin Total: 0.6 mg/dL (ref 0.0–1.2)
CO2: 21 mmol/L (ref 20–29)
Calcium: 8.7 mg/dL (ref 8.6–10.2)
Chloride: 104 mmol/L (ref 96–106)
Creatinine, Ser: 0.93 mg/dL (ref 0.76–1.27)
Globulin, Total: 2.4 g/dL (ref 1.5–4.5)
Glucose: 145 mg/dL — ABNORMAL HIGH (ref 70–99)
Potassium: 3.6 mmol/L (ref 3.5–5.2)
Sodium: 144 mmol/L (ref 134–144)
Total Protein: 7 g/dL (ref 6.0–8.5)
eGFR: 94 mL/min/{1.73_m2} (ref >59)

## 2022-06-26 LAB — LIPID PANEL
Chol/HDL Ratio: 1.8 ratio (ref 0.0–5.0)
Cholesterol, Total: 123 mg/dL (ref 100–199)
HDL: 67 mg/dL (ref >39)
LDL Chol Calc (NIH): 19 mg/dL (ref 0–99)
Triglycerides: 251 mg/dL — ABNORMAL HIGH (ref 0–149)
VLDL Cholesterol Cal: 37 mg/dL (ref 5–40)

## 2022-06-29 ENCOUNTER — Other Ambulatory Visit: Payer: Self-pay | Admitting: Cardiovascular Disease

## 2022-06-29 DIAGNOSIS — I10 Essential (primary) hypertension: Secondary | ICD-10-CM

## 2022-06-29 DIAGNOSIS — E785 Hyperlipidemia, unspecified: Secondary | ICD-10-CM

## 2022-06-29 DIAGNOSIS — E781 Pure hyperglyceridemia: Secondary | ICD-10-CM

## 2022-07-03 ENCOUNTER — Other Ambulatory Visit: Payer: Self-pay

## 2022-07-03 DIAGNOSIS — I10 Essential (primary) hypertension: Secondary | ICD-10-CM

## 2022-07-03 DIAGNOSIS — E781 Pure hyperglyceridemia: Secondary | ICD-10-CM

## 2022-07-03 DIAGNOSIS — E785 Hyperlipidemia, unspecified: Secondary | ICD-10-CM

## 2022-07-03 MED ORDER — ATORVASTATIN CALCIUM 40 MG PO TABS: 40.0000 mg | ORAL_TABLET | Freq: Every day | ORAL | 0 refills | Status: DC

## 2022-07-04 ENCOUNTER — Telehealth (INDEPENDENT_AMBULATORY_CARE_PROVIDER_SITE_OTHER): Payer: Self-pay | Admitting: Primary Care

## 2022-07-04 NOTE — Telephone Encounter (Signed)
Pt received 30 day supply of atorvastatin / pt stated he usually gets 90 day supply / he was seen for med f/u on 2.13.24/please advise /  pts next appt is in May and 30 pills will not be enough til his next appt

## 2022-07-05 NOTE — Telephone Encounter (Signed)
Returned pt call. Made pt aware that his cardiologist refilled medication for only 30 days and he will need to contact them. Made pt aware that he does have his follow up on 3/8 with the cardiologist and to make sure he goes to that appt. Pt states he understands and doesn't have any questions or concerns

## 2022-07-06 NOTE — Progress Notes (Deleted)
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   Past Surgical History:  Procedure Laterality Date   A2 pulley flexor sheath cyst 4th finger excisional biopsy     '03   ANTERIOR CRUCIATE LIGAMENT REPAIR     R knee   ESOPHAGOGASTRODUODENOSCOPY N/A 07/09/2012   Procedure: ESOPHAGOGASTRODUODENOSCOPY (EGD);  Surgeon: Inda Castle, MD;  Location: [facility];  Service: Endoscopy;  Laterality: N/A;   KNEE SURGERY  2008   LEFT HEART CATH AND CORONARY ANGIOGRAPHY N/A 08/10/2019   Procedure: LEFT HEART CATH AND CORONARY ANGIOGRAPHY;  Surgeon: Lorretta Harp, MD;  Location: [facility];  Service: Cardiovascular;  Laterality: N/A;   SHOULDER ARTHROSCOPY WITH SUBACROMIAL DECOMPRESSION AND OPEN ROTATOR C Right 12/26/2012   Procedure: RIGHT SHOULDER ARTHROSCOPY WITH SUBACROMIAL DECOMPRESSION MINI OPEN ROTATOR CUFF REPAIR, OPEN BICEP TENODESIS OPEN DISTAL CLAVICLE RESECTION  ;  Surgeon: Augustin Schooling, MD;  Location: [facility];  Service: Orthopedics;  Laterality: Right;   TRANSFORAMINAL LUMBAR INTERBODY FUSION (TLIF) WITH PEDICLE SCREW FIXATION 1 LEVEL Right 03/27/2018   Procedure: RIGHT-SIDED LUMBAR 4-5 TRANSFORAMINAL LUMBAR INTERBODY FUSION WITH INSTRUMENTATION AND ALLOGRAFT;  Surgeon: Phylliss Bob, MD;  Location: [facility];  Service: Orthopedics;  Laterality: Right;   URETEROSCOPY     and stone retreval     Allergies  Allergies  Allergen Reactions   Hydrocodone-Acetaminophen Palpitations   Shrimp [Shellfish Allergy] Other (See Comments)    Cholesterol level becomes very high. Patient reports sweating and itching.    History of Present Illness    ***  Home Medications    Prior to Admission medications   Medication Sig Start Date End Date Taking? Authorizing Provider  amLODipine (NORVASC) 10 MG tablet Take 1 tablet (10 mg total) by mouth daily. 01/10/22   [name], NP  atorvastatin (LIPITOR) 40 MG tablet Take 1 tablet (40 mg total) by mouth daily. KEEP UPCOMING APPOINTMENT FOR FUTURE REFILLS. 07/03/22   Deberah Pelton, NP  carvedilol (COREG) 25 MG tablet Take 1 tablet (25 mg total) by mouth 2 (two) times daily with a meal. 01/10/22   [name], NP  cyclobenzaprine (FLEXERIL) 10 MG tablet Take 1 tablet by mouth three times daily as needed for muscle spasm 08/09/21   [name], NP  losartan (COZAAR) 100 MG tablet Take 1 tablet (100 mg total) by mouth daily. 01/10/22   [name], NP  Melatonin 10 MG CAPS Take 10 mg by mouth at bedtime as needed. 01/10/22   [name], NP  Multiple Vitamins-Minerals (MULTIVITAMIN WITH MINERALS) tablet Take 1 tablet by mouth daily.    [provider]  omeprazole (PRILOSEC) 40 MG capsule Take 40 mg by mouth daily. 08/30/21   [provider]  oxyCODONE-acetaminophen (PERCOCET)  10-325 MG tablet Take 1 tablet by mouth 3 (three) times daily as needed. 08/31/21   [provider]  potassium chloride SA (KLOR-CON M) 20 MEQ tablet Take 1 tablet (20 mEq total) by mouth daily. 09/21/21   Irene Shipper, MD  Rimegepant Sulfate (NURTEC) 75 MG TBDP Take 75 mg by mouth daily as needed. 09/19/20   Penumalli, Earlean Polka, MD  rizatriptan (MAXALT-MLT) 10 MG disintegrating tablet Take 1 tablet (10 mg total) by mouth as needed for migraine. May repeat in 2 hours if needed 09/19/20   Penumalli, Earlean Polka, MD   Tetrahydrozoline HCl (VISINE OP) Place 1 drop into both eyes daily as needed (redness).    [provider]  triamcinolone cream (KENALOG) 0.1 % Apply 1 application topically 2 (two) times daily. Patient taking differently: Apply 1 application  topically as needed. 09/08/19   Scot Jun, NP  diphenhydrAMINE (BENADRYL) 25 MG tablet Take 1 tablet (25 mg total) by mouth every 6 (six) hours as needed. 04/04/20 06/17/20  Charlesetta Shanks, MD  metoCLOPramide (REGLAN) 10 MG tablet Take 1 tablet (10 mg total) by mouth every 6 (six) hours as needed for nausea (nausea/headache). Take this tablet with 25 mg of Benadryl and two extra strength Tylenol for headache. 04/27/20 06/17/20  [name], NP    Family History    Family History  Problem Relation Age of Onset   Diabetes Mother        x2 brothers   Hypertension Father        X4 siblings   Heart disease Father    Colon cancer Neg Hx    Esophageal cancer Neg Hx    He indicated that his mother is alive. He indicated that his father is deceased. He indicated that the status of his neg hx is unknown.  Social History    Social History   Socioeconomic History   Marital status: Legally Separated    Spouse name: Not on file   Number of children: 3   Years of education: Not on file   Highest education level: Not on file  Occupational History    Employer: [employer]    Comment: Estate agent , retired  Tobacco Use   Smoking status: Former    Types: Cigarettes    Quit date: 07/10/1998    Years since quitting: 24.0   Smokeless tobacco: Never  Vaping Use   Vaping Use: Never used  Substance and Sexual Activity   Alcohol use: Yes    Alcohol/week: 6.0 standard drinks of alcohol    Types: 6 Cans of beer per week   Drug use: No   Sexual activity: Yes  Other Topics Concern   Not on file  Social History Narrative   HS[employer]   [employer] groundskeeper   Difficult domestic situation, Married '87- separated '01   2 sons- '82,  '91; 1 daughter '91   His niece is 63 with metastatic breast cancer (feb '12)   Social Determinants of Health   Financial Resource Strain: Not on file  Food Insecurity: Not on file  Transportation Needs: Not on file  Physical Activity: Not on file  Stress: Not on file  Social Connections: Not on file  Intimate Partner Violence: Not on file     Review of Systems    General:  No chills, fever, night sweats or weight changes.  Cardiovascular:  No chest pain, dyspnea on exertion, edema, orthopnea, palpitations, paroxysmal nocturnal dyspnea. Dermatological: No rash, lesions/masses  Respiratory: No cough, dyspnea Urologic: No hematuria, dysuria Abdominal:   No nausea, vomiting, diarrhea, bright red blood per rectum, melena, or hematemesis Neurologic:  No visual changes, wkns, changes in mental status. All other systems reviewed and are otherwise negative except as noted above.  Physical Exam    VS:  There were no vitals taken for this visit. , BMI There is no height or weight on file to calculate BMI. GEN: Well nourished, well developed, in no acute distress. HEENT: normal. Neck: Supple, no JVD, carotid bruits, or masses. Cardiac: RRR, no murmurs, rubs, or gallops. No clubbing, cyanosis, edema.  Radials/DP/PT 2+ and equal bilaterally.  Respiratory:  Respirations regular and unlabored, clear to auscultation bilaterally. GI: Soft, nontender, nondistended, BS + x 4. MS: no deformity or atrophy. Skin: warm and dry, no rash. Neuro:  Strength and sensation are intact. Psych: Normal affect.  Accessory Clinical Findings    Recent Labs: 09/14/2021: Hemoglobin 12.4; Platelets 202 10/12/2021: Magnesium 1.3 06/25/2022: ALT 18; BUN 18; Creatinine, Ser 0.93; Potassium 3.6; Sodium 144   Recent Lipid Panel    Component Value Date/Time   CHOL 123 06/25/2022 1059   TRIG 251 (H) 06/25/2022 1059   HDL 67 06/25/2022 1059   CHOLHDL 1.8 06/25/2022 1059   CHOLHDL 3 01/24/2012 1535   VLDL 60.4 (H)  01/24/2012 1535   LDLCALC 19 06/25/2022 1059   LDLDIRECT 107.5 01/24/2012 1535    No BP recorded.  {Refresh Note OR Click here to enter BP  :1}***    ECG personally reviewed by me today- *** - No acute changes  Assessment & Plan   1.  ***   Jossie Ng. Belma Dyches NP-C     07/06/2022, 7:38 AM [place] [address] Office [phone] Fax [phone]    I spent***minutes examining this patient, reviewing medications, and using patient centered shared decision making involving her cardiac care.  Prior to her visit I spent greater than 20 minutes reviewing her past medical history,  medications, and prior cardiac tests.

## 2022-07-13 ENCOUNTER — Ambulatory Visit: Payer: Commercial Managed Care - HMO | Admitting: General Practice

## 2022-08-20 NOTE — Progress Notes (Signed)
Cardiology Clinic Note   Patient Name: Nacho Dice Date of Encounter: 08/22/2022  Primary Care Provider:  Grayce Sessions, NP Primary Cardiologist:  Nanetta Batty, MD  Patient Profile    Ralphie Ostling 62 year old male presents the clinic today for follow-up evaluation of his hypertension and coronary artery disease.  Past Medical History    Past Medical History:  Diagnosis Date   Alcohol abuse    Allergy    Anxiety    Arthritis    Diverticulosis of colon (without mention of hemorrhage)    GERD (gastroesophageal reflux disease)    Gout    History of kidney stones    Hyperlipemia    Hypertension    dr Kathlene November  norins   Kidney stones    Nephrolithiasis    Pancreatitis    Hx of    Ureteral stent displacement 1990   Past Surgical History:  Procedure Laterality Date   A2 pulley flexor sheath cyst 4th finger excisional biopsy     '03   ANTERIOR CRUCIATE LIGAMENT REPAIR     R knee   ESOPHAGOGASTRODUODENOSCOPY N/A 07/09/2012   Procedure: ESOPHAGOGASTRODUODENOSCOPY (EGD);  Surgeon: Louis Meckel, MD;  Location: [facility];  Service: Endoscopy;  Laterality: N/A;   KNEE SURGERY  2008   LEFT HEART CATH AND CORONARY ANGIOGRAPHY N/A 08/10/2019   Procedure: LEFT HEART CATH AND CORONARY ANGIOGRAPHY;  Surgeon: Runell Gess, MD;  Location: [facility];  Service: Cardiovascular;  Laterality: N/A;   SHOULDER ARTHROSCOPY WITH SUBACROMIAL DECOMPRESSION AND OPEN ROTATOR C Right 12/26/2012   Procedure: RIGHT SHOULDER ARTHROSCOPY WITH SUBACROMIAL DECOMPRESSION MINI OPEN ROTATOR CUFF REPAIR, OPEN BICEP TENODESIS OPEN DISTAL CLAVICLE RESECTION  ;  Surgeon: Verlee Rossetti, MD;  Location: [facility];  Service: Orthopedics;  Laterality: Right;   TRANSFORAMINAL LUMBAR INTERBODY FUSION (TLIF) WITH PEDICLE SCREW FIXATION 1 LEVEL Right 03/27/2018   Procedure: RIGHT-SIDED LUMBAR 4-5 TRANSFORAMINAL LUMBAR INTERBODY FUSION WITH INSTRUMENTATION AND ALLOGRAFT;  Surgeon: Estill Bamberg, MD;   Location: [facility];  Service: Orthopedics;  Laterality: Right;   URETEROSCOPY     and stone retreval    Allergies  Allergies  Allergen Reactions   Hydrocodone-Acetaminophen Palpitations   Shrimp [Shellfish Allergy] Other (See Comments)    Cholesterol level becomes very high. Patient reports sweating and itching.    History of Present Illness    Demoni Haenel has a PMH of hypertension, hyperlipidemia, tobacco abuse, pancreatitis, GERD, anxiety, arthritis, and coronary artery disease.  He was referred by his PCP for evaluation of his chest pain 07/24/2019.  His chest pain was felt to be atypical.  He underwent coronary CTA on 07/30/2019 which showed proximal LAD lesion.  He then underwent LHC 08/10/2019 which showed a noncritical 40% proximal LAD lesion and medical management was recommended.  It was felt that the lesion was not contributing to his chest discomfort because he was asymptomatic at the time of his cath.   He was seen virtually 02/08/20 and stated he felt well. He did state that his diet could be better. However, he was having some difficulty tolerating different types of foods. He was  trying to take probiotics to help with his stomach issues. He stated that he continued to be physically active walking 1.5 miles daily and occasionally playing golf.  Was staying away from high salt foods.  I gave him the salty 6 diet sheet, had him increase his physical activity as tolerated and planned follow-up in 6 months.  He was seen  in follow-up by Dr. Allyson Sabal on 02/28/2021.  During that time he remained stable from a cardiac stand point.  He was able to walk without symptoms.  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  He reported decreased alcohol intake.  He denied chest pain or shortness of breath.  He presents to the clinic today for follow-up evaluation and states he has started playing golf again.  He denies exertional chest pain.  We reviewed his most recent lab work and lipid panel.  His EKG  today shows normal sinus rhythm with 70 bpm.  He reports that his PCP discontinued his losartan during his last office visit.  He has been monitoring his blood pressure at home which shows blood pressures in the 130s over 90s.  In the clinic today is initially his blood pressure is 147/93 and on recheck is 132/86.  I will reduce his losartan to 25 mg daily and plan follow-up in 12 months.   Today he denies chest pain, shortness of breath, lower extremity edema, fatigue, palpitations, melena, hematuria, hemoptysis, diaphoresis, weakness, presyncope, syncope, orthopnea, and PND.   Home Medications    Prior to Admission medications   Medication Sig Start Date End Date Taking? Authorizing Provider  amLODipine (NORVASC) 10 MG tablet Take 1 tablet (10 mg total) by mouth daily. 01/10/22   Grayce Sessions, NP  atorvastatin (LIPITOR) 40 MG tablet Take 1 tablet (40 mg total) by mouth daily. KEEP UPCOMING APPOINTMENT FOR FUTURE REFILLS. 07/03/22   Ronney Asters, NP  carvedilol (COREG) 25 MG tablet Take 1 tablet (25 mg total) by mouth 2 (two) times daily with a meal. 01/10/22   Grayce Sessions, NP  cyclobenzaprine (FLEXERIL) 10 MG tablet Take 1 tablet by mouth three times daily as needed for muscle spasm 08/09/21   Grayce Sessions, NP  losartan (COZAAR) 100 MG tablet Take 1 tablet (100 mg total) by mouth daily. 01/10/22   Grayce Sessions, NP  Melatonin 10 MG CAPS Take 10 mg by mouth at bedtime as needed. 01/10/22   Grayce Sessions, NP  Multiple Vitamins-Minerals (MULTIVITAMIN WITH MINERALS) tablet Take 1 tablet by mouth daily.    [provider]  omeprazole (PRILOSEC) 40 MG capsule Take 40 mg by mouth daily. 08/30/21   [provider]  oxyCODONE-acetaminophen (PERCOCET) 10-325 MG tablet Take 1 tablet by mouth 3 (three) times daily as needed. 08/31/21   [provider]  potassium chloride SA (KLOR-CON M) 20 MEQ tablet Take 1 tablet (20 mEq total) by mouth daily. 09/21/21    Hilarie Fredrickson, MD  Rimegepant Sulfate (NURTEC) 75 MG TBDP Take 75 mg by mouth daily as needed. 09/19/20   Penumalli, Glenford Bayley, MD  rizatriptan (MAXALT-MLT) 10 MG disintegrating tablet Take 1 tablet (10 mg total) by mouth as needed for migraine. May repeat in 2 hours if needed 09/19/20   Penumalli, Glenford Bayley, MD  Tetrahydrozoline HCl (VISINE OP) Place 1 drop into both eyes daily as needed (redness).    [provider]  triamcinolone cream (KENALOG) 0.1 % Apply 1 application topically 2 (two) times daily. Patient taking differently: Apply 1 application  topically as needed. 09/08/19   Bing Neighbors, NP  diphenhydrAMINE (BENADRYL) 25 MG tablet Take 1 tablet (25 mg total) by mouth every 6 (six) hours as needed. 04/04/20 06/17/20  Arby Barrette, MD  metoCLOPramide (REGLAN) 10 MG tablet Take 1 tablet (10 mg total) by mouth every 6 (six) hours as needed for nausea (nausea/headache).  Take this tablet with 25 mg of Benadryl and two extra strength Tylenol for headache. 04/27/20 06/17/20  Grayce Sessions, NP    Family History    Family History  Problem Relation Age of Onset   Diabetes Mother        x2 brothers   Hypertension Father        X4 siblings   Heart disease Father    Colon cancer Neg Hx    Esophageal cancer Neg Hx    He indicated that his mother is alive. He indicated that his father is deceased. He indicated that the status of his neg hx is unknown.  Social History    Social History   Socioeconomic History   Marital status: Legally Separated    Spouse name: Not on file   Number of children: 3   Years of education: Not on file   Highest education level: Not on file  Occupational History    Employer: [employer]    Comment: Pharmacologist , retired  Tobacco Use   Smoking status: Former    Types: Cigarettes    Quit date: 07/10/1998    Years since quitting: 24.1   Smokeless tobacco: Never  Vaping Use   Vaping Use: Never used  Substance and Sexual Activity    Alcohol use: Yes    Alcohol/week: 6.0 standard drinks of alcohol    Types: 6 Cans of beer per week   Drug use: No   Sexual activity: Yes  Other Topics Concern   Not on file  Social History Narrative   HS[employer]   [employer] groundskeeper   Difficult domestic situation, Married '87- separated '01   2 sons- '82, '91; 1 daughter '91   His niece is 61 with metastatic breast cancer (feb '12)   Social Determinants of Health   Financial Resource Strain: Not on file  Food Insecurity: Not on file  Transportation Needs: Not on file  Physical Activity: Not on file  Stress: Not on file  Social Connections: Not on file  Intimate Partner Violence: Not on file     Review of Systems    General:  No chills, fever, night sweats or weight changes.  Cardiovascular:  No chest pain, dyspnea on exertion, edema, orthopnea, palpitations, paroxysmal nocturnal dyspnea. Dermatological: No rash, lesions/masses Respiratory: No cough, dyspnea Urologic: No hematuria, dysuria Abdominal:   No nausea, vomiting, diarrhea, bright red blood per rectum, melena, or hematemesis Neurologic:  No visual changes, wkns, changes in mental status. All other systems reviewed and are otherwise negative except as noted above.  Physical Exam    VS:  BP 132/86   Pulse 74   Ht 6' (1.829 m)   Wt 260 lb 12.8 oz (118.3 kg)   SpO2 97%   BMI 35.37 kg/m  , BMI Body mass index is 35.37 kg/m. GEN: Well nourished, well developed, in no acute distress. HEENT: normal. Neck: Supple, no JVD, carotid bruits, or masses. Cardiac: RRR, no murmurs, rubs, or gallops. No clubbing, cyanosis, edema.  Radials/DP/PT 2+ and equal bilaterally.  Respiratory:  Respirations regular and unlabored, clear to auscultation bilaterally. GI: Soft, nontender, nondistended, BS + x 4. MS: no deformity or atrophy. Skin: warm and dry, no rash. Neuro:  Strength and sensation are intact. Psych: Normal affect.  Accessory Clinical Findings    Recent Labs: 09/14/2021:  Hemoglobin 12.4; Platelets 202 10/12/2021: Magnesium 1.3 06/25/2022: ALT 18; BUN 18; Creatinine, Ser 0.93; Potassium 3.6; Sodium 144   Recent Lipid  Panel    Component Value Date/Time   CHOL 123 06/25/2022 1059   TRIG 251 (H) 06/25/2022 1059   HDL 67 06/25/2022 1059   CHOLHDL 1.8 06/25/2022 1059   CHOLHDL 3 01/24/2012 1535   VLDL 60.4 (H) 01/24/2012 1535   LDLCALC 19 06/25/2022 1059   LDLDIRECT 107.5 01/24/2012 1535         ECG personally reviewed by me today-normal sinus rhythm 70 bpm no ectopy- No acute changes  Coronary CTA 07/28/2019 IMPRESSION: 1. Severe CAD in proximal LAD, CADRADS = 4. CT FFR will be performed and reported separately.   2. Coronary artery calcium score is 207, which places the patient in the 3 percentile for age and sex matched control.   3. Normal coronary origins with right dominance.   4. Mild ascending aorta dilation, 41 mm at mid ascending aorta.     Electronically Signed   By: Weston Brass   Cardiac catheterization 08/10/2019 Prox LAD to Mid LAD lesion is 40% stenosed. The left ventricular systolic function is normal. LV end diastolic pressure is normal.   LORREN SPLAWN is a 62 y.o. male   Diagnostic Dominance: Right  Intervention  Assessment & Plan   1.  Essential hypertension-BP today 132/86. Continue amlodipine,  carvedilol Reduce losartan to 25 mg daily Heart healthy low-sodium diet Increase physical activity as tolerated Maintain blood pressure log  Chest pain-no chest pain today.  Underwent coronary CTA 3/21 which showed significant proximal LAD lesion.  Cardiac catheterization 3/21 showed 40% segmental proximal LAD lesion.  Medical management was recommended. Continue to monitor  Hyperlipidemia-LDL 19 on 06/25/2022. Continue atorvastatin High-fiber low-sodium diet-high cholesterol foods list given   Disposition: Follow-up with Dr.Berry or me in 12 months.   Thomasene Ripple. Anasofia Micallef NP-C     08/22/2022, 4:06 PM [facility] [address] Office [phone] Fax [phone]    I spent 14 minutes examining this patient, reviewing medications, and using patient centered shared decision making involving her cardiac care.  Prior to her visit I spent greater than 20 minutes reviewing her past medical history,  medications, and prior cardiac tests.

## 2022-08-22 ENCOUNTER — Ambulatory Visit: Payer: Commercial Managed Care - HMO | Attending: General Practice | Admitting: General Practice

## 2022-08-22 ENCOUNTER — Encounter: Payer: Self-pay | Admitting: General Practice

## 2022-08-22 VITALS — BP 132/86 | HR 74 | Ht 72.0 in | Wt 260.8 lb

## 2022-08-22 DIAGNOSIS — E781 Pure hyperglyceridemia: Secondary | ICD-10-CM | POA: Diagnosis not present

## 2022-08-22 DIAGNOSIS — I10 Essential (primary) hypertension: Secondary | ICD-10-CM | POA: Diagnosis not present

## 2022-08-22 DIAGNOSIS — E785 Hyperlipidemia, unspecified: Secondary | ICD-10-CM

## 2022-08-22 DIAGNOSIS — I2510 Atherosclerotic heart disease of native coronary artery without angina pectoris: Secondary | ICD-10-CM

## 2022-08-22 DIAGNOSIS — R079 Chest pain, unspecified: Secondary | ICD-10-CM | POA: Diagnosis not present

## 2022-08-22 DIAGNOSIS — Z760 Encounter for issue of repeat prescription: Secondary | ICD-10-CM

## 2022-08-22 MED ORDER — LOSARTAN POTASSIUM 25 MG PO TABS: 25.0000 mg | ORAL_TABLET | Freq: Every day | ORAL | Status: DC

## 2022-08-22 MED ORDER — ATORVASTATIN CALCIUM 40 MG PO TABS: 40.0000 mg | ORAL_TABLET | Freq: Every day | ORAL | 3 refills | Status: DC

## 2022-08-22 NOTE — Patient Instructions (Addendum)
Medication Instructions:  TAKE LOSARTAN  DAILY (1/4 PILL) *If you need a refill on your cardiac medications before your next appointment, please call your pharmacy*  Lab Work: NONE If you have labs (blood work) drawn today and your tests are completely normal, you will receive your results only by:  MyChart Message (if you have MyChart) OR  A paper copy in the mail If you have any lab test that is abnormal or we need to change your treatment, we will call you to review the results.  Other Instructions TAKE BLOOD PRESSURE A FEW TIMES WEEKLY  PLEASE READ ATTACHED DIETS  Follow-Up: At [facility], you and your health needs are our priority.  As part of our continuing mission to provide you with exceptional heart care, we have created designated Provider Care Teams.  These Care Teams include your primary Cardiologist (physician) and Advanced Practice Providers (APPs -  Physician Assistants and Nurse Practitioners) who all work together to provide you with the care you need, when you need it.  Your next appointment:   12 month(s)  Provider:   Nanetta Batty, MD         Heart-Healthy Eating Plan Eating a healthy diet is important for the health of your heart. A heart-healthy eating plan includes: Eating less unhealthy fats. Eating more healthy fats. Eating less salt in your food. Salt is also called sodium. Making other changes in your diet. Talk with your doctor or a diet specialist (dietitian) to create an eating plan that is right for you. What is my plan? What are tips for following this plan? Cooking Avoid frying your food. Try to bake, boil, grill, or broil it instead. You can also reduce fat by: Removing the skin from poultry. Removing all visible fats from meats. Steaming vegetables in water or broth. Meal planning  At meals, divide your plate into four equal parts: Fill one-half of your plate with vegetables and green salads. Fill one-fourth of your plate  with whole grains. Fill one-fourth of your plate with lean protein foods. Eat 2-4 cups of vegetables per day. One cup of vegetables is: 1 cup (91 g) broccoli or cauliflower florets. 2 medium carrots. 1 large bell pepper. 1 large sweet potato. 1 large tomato. 1 medium white potato. 2 cups (150 g) raw leafy greens. Eat 1-2 cups of fruit per day. One cup of fruit is: 1 small apple 1 large banana 1 cup (237 g) mixed fruit, 1 large orange,  cup (82 g) dried fruit, 1 cup (240 mL) 100% fruit juice. Eat more foods that have soluble fiber. These are apples, broccoli, carrots, beans, peas, and barley. Try to get 20-30 g of fiber per day. Eat 4-5 servings of nuts, legumes, and seeds per week: 1 serving of dried beans or legumes equals  cup (90 g) cooked. 1 serving of nuts is  oz (12 almonds, 24 pistachios, or 7 walnut halves). 1 serving of seeds equals  oz (8 g). General information Eat more home-cooked food. Eat less restaurant, buffet, and fast food. Limit or avoid alcohol. Limit foods that are high in starch and sugar. Avoid fried foods. Lose weight if you are overweight. Keep track of how much salt (sodium) you eat. This is important if you have high blood pressure. Ask your doctor to tell you more about this. Try to add vegetarian meals each week. Fats Choose healthy fats. These include olive oil and canola oil, flaxseeds, walnuts, almonds, and seeds. Eat more omega-3 fats. These include  salmon, mackerel, sardines, tuna, flaxseed oil, and ground flaxseeds. Try to eat fish at least 2 times each week. Check food labels. Avoid foods with trans fats or high amounts of saturated fat. Limit saturated fats. These are often found in animal products, such as meats, butter, and cream. These are also found in plant foods, such as palm oil, palm kernel oil, and coconut oil. Avoid foods with partially hydrogenated oils in them. These have trans fats. Examples are stick margarine, some tub  margarines, cookies, crackers, and other baked goods. What foods should I eat? Fruits All fresh, canned (in natural juice), or frozen fruits. Vegetables Fresh or frozen vegetables (raw, steamed, roasted, or grilled). Green salads. Grains Most grains. Choose whole wheat and whole grains most of the time. Rice and pasta, including brown rice and pastas made with whole wheat. Meats and other proteins Lean, well-trimmed beef, veal, pork, and lamb. Chicken and [place] without skin. All fish and shellfish. Wild duck, rabbit, pheasant, and venison. Egg whites or low-cholesterol egg substitutes. Dried beans, peas, lentils, and tofu. Seeds and most nuts. Dairy Low-fat or nonfat cheeses, including ricotta and mozzarella. Skim or 1% milk that is liquid, powdered, or evaporated. Buttermilk that is made with low-fat milk. Nonfat or low-fat yogurt. Fats and oils Non-hydrogenated (trans-free) margarines. Vegetable oils, including soybean, sesame, sunflower, olive, peanut, safflower, corn, canola, and cottonseed. Salad dressings or mayonnaise made with a vegetable oil. Beverages Mineral water. Coffee and tea. Diet carbonated beverages. Sweets and desserts Sherbet, gelatin, and fruit ice. Small amounts of dark chocolate. Limit all sweets and desserts. Seasonings and condiments All seasonings and condiments. The items listed above may not be a complete list of foods and drinks you can eat. Contact a dietitian for more options. What foods should I avoid? Fruits Canned fruit in heavy syrup. Fruit in cream or butter sauce. Fried fruit. Limit coconut. Vegetables Vegetables cooked in cheese, cream, or butter sauce. Fried vegetables. Grains Breads that are made with saturated or trans fats, oils, or whole milk. Croissants. Sweet rolls. Donuts. High-fat crackers, such as cheese crackers. Meats and other proteins Fatty meats, such as hot dogs, ribs, sausage, bacon, rib-eye roast or steak. High-fat deli meats,  such as salami and bologna. Caviar. Domestic duck and goose. Organ meats, such as liver. Dairy Cream, sour cream, cream cheese, and creamed cottage cheese. Whole-milk cheeses. Whole or 2% milk that is liquid, evaporated, or condensed. Whole buttermilk. Cream sauce or high-fat cheese sauce. Yogurt that is made from whole milk. Fats and oils Meat fat, or shortening. Cocoa butter, hydrogenated oils, palm oil, coconut oil, palm kernel oil. Solid fats and shortenings, including bacon fat, salt pork, lard, and butter. Nondairy cream substitutes. Salad dressings with cheese or sour cream. Beverages Regular sodas and juice drinks with added sugar. Sweets and desserts Frosting. Pudding. Cookies. Cakes. Pies. Milk chocolate or white chocolate. Buttered syrups. Full-fat ice cream or ice cream drinks. The items listed above may not be a complete list of foods and drinks to avoid. Contact a dietitian for more information. Summary Heart-healthy meal planning includes eating less unhealthy fats, eating more healthy fats, and making other changes in your diet. Eat a balanced diet. This includes fruits and vegetables, low-fat or nonfat dairy, lean protein, nuts and legumes, whole grains, and heart-healthy oils and fats. This information is not intended to replace advice given to you by your health care provider. Make sure you discuss any questions you have with your health care provider. Document Revised: 05/29/2021  Document Reviewed: 05/29/2021 Elsevier Patient Education  2023 [employer].

## 2022-08-23 ENCOUNTER — Encounter: Payer: Self-pay | Admitting: General Practice

## 2022-08-23 ENCOUNTER — Telehealth: Payer: Self-pay | Admitting: General Practice

## 2022-08-23 DIAGNOSIS — Z760 Encounter for issue of repeat prescription: Secondary | ICD-10-CM

## 2022-08-23 MED ORDER — LOSARTAN POTASSIUM 25 MG PO TABS: 25.0000 mg | ORAL_TABLET | Freq: Every day | ORAL | 3 refills | Status: DC

## 2022-08-23 NOTE — Telephone Encounter (Signed)
*  STAT* If patient is at the pharmacy, call can be transferred to refill team.   1. Which medications need to be refilled? (please list name of each medication and dose if known) losartan (COZAAR) 25 MG tablet   2. Which pharmacy/location (including street and city if local pharmacy) is medication to be sent to? [id number] - [place], [place] - [address]   3. Do they need a 30 day or 90 day supply? 90    Please send 25 mg tablets, pt states he is unable to cut the 100 mg tablets

## 2022-08-23 NOTE — Telephone Encounter (Signed)
Refills has been sent to the pharmacy. 

## 2022-09-10 ENCOUNTER — Ambulatory Visit (HOSPITAL_COMMUNITY)
Admission: EM | Admit: 2022-09-10 | Discharge: 2022-09-10 | Disposition: A | Discharge status: Home / Self Care | Payer: Commercial Managed Care - HMO

## 2022-09-10 DIAGNOSIS — F1010 Alcohol abuse, uncomplicated: Secondary | ICD-10-CM

## 2022-09-10 NOTE — Discharge Instructions (Signed)

## 2022-09-10 NOTE — ED Provider Notes (Signed)
Behavioral Health Urgent Care Medical Screening Exam  Patient Name: [name] MRN: [id number] Date of Evaluation: 09/10/22 Chief Complaint:   Diagnosis:  Final diagnoses:  Alcohol abuse    History of Present illness: [name] is a 62 y.o. male.  Presents to [facility] urgent care seeking treatment for detox.  States he was recently seen and evaluated by [facility] residential services.  States he was dropped off due to their requirements.  States he last had  " a few beers" on Saturday.  He reports drinking daily.  States he has been drinking for 15+ years.  Reports his longest period of sobriety was 5 years after he developed pancreatitis.  He denied history of withdrawal seizures or delirium tremors.  Currently denying cravings, sweats,  nausea. vomiting shaking or dizziness.  He denies any other illicit substance abuse use.  Denies that he is currently employed. "  I waited so long because I had to get my mother situated."  Does report previous residential treatment in the past.  [name] denies suicidal or homicidal ideations.  Denies auditory visual hallucinations.  Denies that he is ever been followed by therapy and/or psychiatry.  Denied any other mental health diagnoses.  This provider contacted [facility] regarding admission criteria and bed availability.  Spoke to [place] who reports they will send her transportation vehicle to pick patient back up and transport to their facility.   During evaluation [name] is sitting in no acute distress. he is alert/oriented x 4; calm/cooperative; and mood congruent with affect. he is speaking in a clear tone at moderate volume, and normal pace; with good eye contact. his thought process is coherent and relevant; There is no indication that he is currently responding to internal/external stimuli or experiencing delusional thought content; and he has denied suicidal/self-harm/homicidal ideation, psychosis, and paranoia.   Patient has  remained calm throughout assessment and has answered questions appropriately.    At this time [name] is educated and verbalizes understanding of mental health resources and other crisis services in the community. he is instructed to call 911 and present to the nearest emergency room should he experience any suicidal/homicidal ideation, auditory/visual/hallucinations, or detrimental worsening of his mental health condition.he was a also advised by Clinical research associate that he could call the toll-free phone on insurance card to assist with identifying in network counselors and agencies or number on back of Medicaid card to speak with care coordinator.    Flowsheet Row ED from 09/10/2022 in [facility] ED from 09/19/2021 in [facility] at [facility] ED from 09/14/2021 in [facility] Emergency Department at [facility]  C-SSRS RISK CATEGORY No Risk No Risk No Risk       Psychiatric Specialty Exam  Presentation  General Appearance:Appropriate for Environment  Eye Contact:Good  Speech:Clear and Coherent  Speech Volume:Normal  Handedness:Right   Mood and Affect  Mood:Anxious  Affect:Congruent   Thought Process  Thought Processes:Coherent  Descriptions of Associations:Intact  Orientation:Full (Time, Place and Person)  Thought Content:Logical    Hallucinations:None  Ideas of Reference:None  Suicidal Thoughts:No  Homicidal Thoughts:No   Sensorium  Memory:Immediate Good; Remote Good  Judgment:Good  Insight:Good   Executive Functions  Concentration:Fair  Attention Span:Good  Recall:Good  Fund of Knowledge:Good  Language:Good   Psychomotor Activity  Psychomotor Activity:Normal   Assets  Assets:Desire for Improvement   Sleep  Sleep:Fair  Number of hours: No data recorded  Physical Exam: Physical Exam Vitals and nursing note reviewed.  Cardiovascular:     Rate and Rhythm: Normal rate and regular rhythm.      Pulses: Normal pulses.     Heart sounds: Normal heart sounds.  Pulmonary:     Effort: Pulmonary effort is normal.     Breath sounds: Normal breath sounds.  Skin:    General: Skin is warm and dry.  Neurological:     Mental Status: He is alert and oriented to person, place, and time.  Psychiatric:        Mood and Affect: Mood normal.        Behavior: Behavior normal.    Review of Systems  Respiratory: Negative.    Cardiovascular: Negative.   Psychiatric/Behavioral:  Positive for substance abuse.   All other systems reviewed and are negative.  Blood pressure (!) 143/96, pulse 77, temperature 97.7 F (36.5 C), temperature source Oral, resp. rate 18, SpO2 99 %. There is no height or weight on file to calculate BMI.  Musculoskeletal: Strength & Muscle Tone: within normal limits Gait & Station: normal Patient leans: N/A   [facility] MSE Discharge Disposition for Follow up and Recommendations: Based on my evaluation the patient does not appear to have an emergency medical condition and can be discharged with resources and follow up care in outpatient services for Substance Abuse Intensive Outpatient Program    Oneta Rack, NP 09/10/2022, 11:09 AM

## 2022-09-10 NOTE — Progress Notes (Signed)
   09/10/22 1025  [facility] Triage Screening (Walk-ins at [facility] only)  What Is the Reason for Your Visit/Call Today? Pt presents to [facility] voluntarily seeking alcohol use treatment. Pt states he was sent from [facility].Pt reports drinking alcohol daily about 1 pint and 2-3, 24oz beers. Last use was saturday 5/4. Pt denies any psychiatric hospitalization. Pt denies any psychiatric diagnosis. Pt reports he lives with his mother currently. Pt denies SI/HI and AVH.  How Long Has This Been Causing You Problems? > than 6 months  Have You Recently Had Any Thoughts About Hurting Yourself? No  Are You Planning to Commit Suicide/Harm Yourself At This time? No  Have you Recently Had Thoughts About Hurting Someone Karolee Ohs? No  Are You Planning To Harm Someone At This Time? No  Are you currently experiencing any auditory, visual or other hallucinations? No  Have You Used Any Alcohol or Drugs in the Past 24 Hours? No  Do you have any current medical co-morbidities that require immediate attention? No  Clinician description of patient physical appearance/behavior: calm ,cooperative, casually dressed  What Do You Feel Would Help You the Most Today? Alcohol or Drug Use Treatment  If access to [facility] Urgent [facility] was not available, would you have sought care in the Emergency Department? No  Determination of Need Routine (7 days)  Options For Referral Outpatient Therapy;Medication Management;Chemical Dependency Intensive Outpatient Therapy (CDIOP)

## 2022-09-17 ENCOUNTER — Ambulatory Visit (INDEPENDENT_AMBULATORY_CARE_PROVIDER_SITE_OTHER): Payer: Self-pay | Admitting: Primary Care

## 2022-09-29 ENCOUNTER — Encounter (HOSPITAL_COMMUNITY): Payer: Self-pay | Admitting: Emergency Medicine

## 2022-09-29 ENCOUNTER — Ambulatory Visit (HOSPITAL_COMMUNITY)
Admission: EM | Admit: 2022-09-29 | Discharge: 2022-09-29 | Disposition: A | Discharge status: Home / Self Care | Payer: Commercial Managed Care - HMO | Attending: Emergency Medicine | Admitting: Emergency Medicine

## 2022-09-29 DIAGNOSIS — M79672 Pain in left foot: Secondary | ICD-10-CM

## 2022-09-29 MED ORDER — KETOROLAC TROMETHAMINE 30 MG/ML IJ SOLN
30.0000 mg | Freq: Once | INTRAMUSCULAR | Status: AC
  Administered 2022-09-29: 30 mg via INTRAMUSCULAR

## 2022-09-29 MED ORDER — KETOROLAC TROMETHAMINE 30 MG/ML IJ SOLN
INTRAMUSCULAR | Status: AC
  Filled 2022-09-29: qty 1

## 2022-09-29 MED ORDER — PREDNISONE 10 MG (21) PO TBPK: ORAL_TABLET | Freq: Every day | ORAL | 0 refills | Status: DC

## 2022-09-29 NOTE — ED Provider Notes (Signed)
[facility]    CSN: [id number] Arrival date & time: 09/29/22  1044      History   Chief Complaint Chief Complaint  Patient presents with   Foot Pain    HPI [name] is a 63 y.o. male.   Patient presents for evaluation of left foot and toe pain beginning 2 days ago without precipitating event, injury or trauma, was mopping the floor when symptoms occurred.  Painful to palpation and can be felt with bearing weight.  Associated numbness and tingling.  Has attempted use of ibuprofen, Tylenol, ice heat, elevation, warm soaks and Biofreeze with no relief to pain.  Endorses that swelling has improved.  History of gout.  Past Medical History:  Diagnosis Date   Alcohol abuse    Allergy    Anxiety    Arthritis    Diverticulosis of colon (without mention of hemorrhage)    GERD (gastroesophageal reflux disease)    Gout    History of kidney stones    Hyperlipemia    Hypertension    dr Kathlene November  norins   Kidney stones    Nephrolithiasis    Pancreatitis    Hx of    Ureteral stent displacement [facility]) 1990    Patient Active Problem List   Diagnosis Date Noted   Chest pain of uncertain etiology 07/24/2019   Alcoholic pancreatitis 05/20/2018   Normal anion gap metabolic acidosis 05/20/2018   Radiculopathy 03/27/2018   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/02/2017   Renal stone 10/08/2016   Non-compliant behavior 12/02/2013   Routine health maintenance 01/27/2012   Hyperlipidemia 01/27/2012   Knee pain, left 11/12/2011   BPH (benign prostatic hyperplasia) 11/12/2011   GERD 08/10/2008   Essential hypertension 04/14/2008   Alcohol abuse 10/26/2007   TRIGGER FINGER, LEFT MIDDLE 05/12/2007   NEPHROLITHIASIS, HX OF 05/12/2007    Past Surgical History:  Procedure Laterality Date   A2 pulley flexor sheath cyst 4th finger excisional biopsy     '03   ANTERIOR CRUCIATE LIGAMENT REPAIR     R knee   ESOPHAGOGASTRODUODENOSCOPY N/A 07/09/2012   Procedure:  ESOPHAGOGASTRODUODENOSCOPY (EGD);  Surgeon: Louis Meckel, MD;  Location: [facility];  Service: Endoscopy;  Laterality: N/A;   KNEE SURGERY  2008   LEFT HEART CATH AND CORONARY ANGIOGRAPHY N/A 08/10/2019   Procedure: LEFT HEART CATH AND CORONARY ANGIOGRAPHY;  Surgeon: Runell Gess, MD;  Location: [facility];  Service: Cardiovascular;  Laterality: N/A;   SHOULDER ARTHROSCOPY WITH SUBACROMIAL DECOMPRESSION AND OPEN ROTATOR C Right 12/26/2012   Procedure: RIGHT SHOULDER ARTHROSCOPY WITH SUBACROMIAL DECOMPRESSION MINI OPEN ROTATOR CUFF REPAIR, OPEN BICEP TENODESIS OPEN DISTAL CLAVICLE RESECTION  ;  Surgeon: Verlee Rossetti, MD;  Location: [facility] OR;  Service: Orthopedics;  Laterality: Right;   TRANSFORAMINAL LUMBAR INTERBODY FUSION (TLIF) WITH PEDICLE SCREW FIXATION 1 LEVEL Right 03/27/2018   Procedure: RIGHT-SIDED LUMBAR 4-5 TRANSFORAMINAL LUMBAR INTERBODY FUSION WITH INSTRUMENTATION AND ALLOGRAFT;  Surgeon: Estill Bamberg, MD;  Location: [facility] OR;  Service: Orthopedics;  Laterality: Right;   URETEROSCOPY     and stone retreval       Home Medications    Prior to Admission medications   Medication Sig Start Date End Date Taking? Authorizing Provider  amLODipine (NORVASC) 10 MG tablet Take 1 tablet (10 mg total) by mouth daily. 01/10/22   Grayce Sessions, NP  atorvastatin (LIPITOR) 40 MG tablet Take 1 tablet (40 mg total) by mouth daily. 08/22/22   Ronney Asters, NP  carvedilol (COREG) 25 MG tablet Take 1 tablet (25 mg total) by mouth 2 (two) times daily with a meal. 01/10/22   Grayce Sessions, NP  cyclobenzaprine (FLEXERIL) 10 MG tablet Take 1 tablet by mouth three times daily as needed for muscle spasm 08/09/21   Grayce Sessions, NP  losartan (COZAAR) 25 MG tablet Take 1 tablet (25 mg total) by mouth daily. 08/23/22   Ronney Asters, NP  Melatonin 10 MG CAPS Take 10 mg by mouth at bedtime as needed. 01/10/22   Grayce Sessions, NP  Multiple Vitamins-Minerals (MULTIVITAMIN WITH  MINERALS) tablet Take 1 tablet by mouth daily.    [provider]  omeprazole (PRILOSEC) 40 MG capsule Take 40 mg by mouth daily. 08/30/21   [provider]  oxyCODONE-acetaminophen (PERCOCET) 10-325 MG tablet Take 1 tablet by mouth 3 (three) times daily as needed. 08/31/21   [provider]  potassium chloride SA (KLOR-CON M) 20 MEQ tablet Take 1 tablet (20 mEq total) by mouth daily. 09/21/21   Hilarie Fredrickson, MD  Rimegepant Sulfate (NURTEC) 75 MG TBDP Take 75 mg by mouth daily as needed. 09/19/20   Penumalli, Glenford Bayley, MD  rizatriptan (MAXALT-MLT) 10 MG disintegrating tablet Take 1 tablet (10 mg total) by mouth as needed for migraine. May repeat in 2 hours if needed 09/19/20   Penumalli, Glenford Bayley, MD  Tetrahydrozoline HCl (VISINE OP) Place 1 drop into both eyes daily as needed (redness).    [provider]  triamcinolone cream (KENALOG) 0.1 % Apply 1 application topically 2 (two) times daily. Patient taking differently: Apply 1 application  topically as needed. 09/08/19   Bing Neighbors, NP  diphenhydrAMINE (BENADRYL) 25 MG tablet Take 1 tablet (25 mg total) by mouth every 6 (six) hours as needed. 04/04/20 06/17/20  Arby Barrette, MD  metoCLOPramide (REGLAN) 10 MG tablet Take 1 tablet (10 mg total) by mouth every 6 (six) hours as needed for nausea (nausea/headache). Take this tablet with 25 mg of Benadryl and two extra strength Tylenol for headache. 04/27/20 06/17/20  Grayce Sessions, NP    Family History Family History  Problem Relation Age of Onset   Diabetes Mother        x2 brothers   Hypertension Father        X4 siblings   Heart disease Father    Colon cancer Neg Hx    Esophageal cancer Neg Hx     Social History Social History   Tobacco Use   Smoking status: Former    Types: Cigarettes    Quit date: 07/10/1998    Years since quitting: 24.2   Smokeless tobacco: Never  Vaping Use   Vaping Use: Never used  Substance Use Topics   Alcohol use:  Yes    Alcohol/week: 6.0 standard drinks of alcohol    Types: 6 Cans of beer per week   Drug use: No     Allergies   Hydrocodone-acetaminophen and Shrimp [shellfish allergy]   Review of Systems Review of Systems   Physical Exam Triage Vital Signs ED Triage Vitals  Enc Vitals Group     BP 09/29/22 1131 113/75     Pulse Rate 09/29/22 1131 77     Resp 09/29/22 1131 18     Temp 09/29/22 1131 98.4 F (36.9 C)     Temp Source 09/29/22 1131 Oral     SpO2 09/29/22 1131 95 %     Weight --  Height --      Head Circumference --      Peak Flow --      Pain Score 09/29/22 1130 10     Pain Loc --      Pain Edu? --      Excl. in GC? --    No data found.  Updated Vital Signs BP 113/75 (BP Location: Left Arm)   Pulse 77   Temp 98.4 F (36.9 C) (Oral)   Resp 18   SpO2 95%   Visual Acuity Right Eye Distance:   Left Eye Distance:   Bilateral Distance:    Right Eye Near:   Left Eye Near:    Bilateral Near:     Physical Exam Constitutional:      Appearance: Normal appearance.  Eyes:     Extraocular Movements: Extraocular movements intact.  Pulmonary:     Effort: Pulmonary effort is normal.  Musculoskeletal:     Comments: Mild to moderate swelling present to the left midfoot extending to toes 2 through 5, tenderness throughout the midfoot without point tenderness, able to bear weight, sensation intact, 2+ pedal pulse, capillary refill less than 3  Neurological:     Mental Status: He is alert and oriented to person, place, and time. Mental status is at baseline.      UC Treatments / Results  Labs (all labs ordered are listed, but only abnormal results are displayed) Labs Reviewed - No data to display  EKG   Radiology No results found.  Procedures Procedures (including critical care time)  Medications Ordered in UC Medications - No data to display  Initial Impression / Assessment and Plan / UC Course  I have reviewed the triage vital signs and the  nursing notes.  Pertinent labs & imaging results that were available during my care of the patient were reviewed by me and considered in my medical decision making (see chart for details).  Acute left foot pain  Low suspicion for bony involvement due to lack of injury therefore imaging deferred, discussed this with patient, most likely muscular irritation, presentation not consistent with gout, Toradol injection given and prescribed prednisone burst for outpatient use, recommended continued supportive care with follow-up with podiatry if symptoms continue to persist or worsen Final Clinical Impressions(s) / UC Diagnoses   Final diagnoses:  None   Discharge Instructions   None    ED Prescriptions   None    PDMP not reviewed this encounter.   Valinda Hoar, NP 09/29/22 1157

## 2022-09-29 NOTE — Discharge Instructions (Signed)
Today I believe your pain is related to irritation to the muscles, no injury occurred and therefore I do not believe there is injury to your bone and therefore we have deferred use of x-ray imaging today  You have been given an injection of Toradol which helps to reduce inflammation and ideally will start to tone some of your discomfort down in about 30 minutes  Starting tomorrow take prednisone every morning with food for 5 days to continue above process to help with pain, you may take Tylenol 500 to 1000 mg every 6 hours in addition to this as needed  You may continue to use ice or heat over the affected area in 10 to 15-minute intervals, may also continue warm soaks  When sitting and lying elevate foot on 2 pillows to further help reduce swelling  You may use compression socks which help with circulation and help to reduce swelling, may be found at most local stores  May continue activity as tolerated  If you continue to have discomfort past use of medications please follow-up with podiatry who is the specialist for further evaluation and management, information is listed on front page

## 2022-09-29 NOTE — ED Triage Notes (Addendum)
Pt c/o left foot pain and swelling for few days after his foot got several pain while mopping and toes curled up. Denies injury. Tried ice, heat, elevation, soaking, and Biofreeze, Ibuprofen and Tylenol.

## 2022-10-09 ENCOUNTER — Ambulatory Visit (INDEPENDENT_AMBULATORY_CARE_PROVIDER_SITE_OTHER): Payer: 59

## 2022-10-09 ENCOUNTER — Encounter: Payer: Self-pay | Admitting: Emergency Medicine

## 2022-10-09 ENCOUNTER — Ambulatory Visit
Admission: EM | Admit: 2022-10-09 | Discharge: 2022-10-09 | Disposition: A | Discharge status: Home / Self Care | Payer: 59 | Attending: Family Medicine | Admitting: Family Medicine

## 2022-10-09 ENCOUNTER — Other Ambulatory Visit: Payer: Self-pay

## 2022-10-09 ENCOUNTER — Ambulatory Visit (INDEPENDENT_AMBULATORY_CARE_PROVIDER_SITE_OTHER): Payer: Self-pay | Admitting: *Deleted

## 2022-10-09 DIAGNOSIS — M79672 Pain in left foot: Secondary | ICD-10-CM

## 2022-10-09 DIAGNOSIS — L989 Disorder of the skin and subcutaneous tissue, unspecified: Secondary | ICD-10-CM | POA: Diagnosis not present

## 2022-10-09 MED ORDER — NAPROXEN 500 MG PO TABS: 500.0000 mg | ORAL_TABLET | Freq: Two times a day (BID) | ORAL | 0 refills | Status: AC

## 2022-10-09 MED ORDER — SULFAMETHOXAZOLE-TRIMETHOPRIM 800-160 MG PO TABS: 1.0000 | ORAL_TABLET | Freq: Two times a day (BID) | ORAL | 0 refills | Status: AC

## 2022-10-09 NOTE — Discharge Instructions (Addendum)
You have been seen today for your foot pain. Many things can cause foot pain. The exact cause of your foot pain is unknown at this time. XR of your foot as well as blood work has been done. Results are currently pending. Please check your MyChart to review the results. You will receive a call if anything is abnormal and/or if a change in therapy is needed. Take medications as prescribed. Continue supportive care measures. Follow-up with your primary care provider next week as scheduled. She may need to refer you to podiatry.   Do not stand or walk for long periods. Do stretches to relieve foot pain and stiffness  Do not lift anything that is heavier than 10 lb. Lifting a lot of weight can put added pressure on your feet. Wear comfortable, supportive shoes that fit you well.  Keep your feet clean and dry.

## 2022-10-09 NOTE — Telephone Encounter (Signed)
  Chief Complaint:  Pain, swelling, redness on top of foot and middle toe.  Seen at urgent care a week ago but prednisone and Toradol shot did not help. Chief Complaint: Left foot on top still swollen, red and painful along with the middle toe. Symptoms: above Frequency: For the last week or so Pertinent Negatives: Patient denies it getting better.   No injuries or accidents.   Started with a cramp on the top of his foot. Disposition: [] ED /[x] Urgent Care (no appt availability in office) / [] Appointment(In office/virtual)/ []  [place] [facility]/ [] Home Care/ [] Refused Recommended Disposition /[]  Mobile Bus/ []  Follow-up with PCP []  Follow-up with PCP Additional Notes: I have referred him to the urgent care.   No appts. Until June 17 at [facility].  Already has an appt for June 13.   Pt agreeable to going to the urgent care.

## 2022-10-09 NOTE — Telephone Encounter (Signed)
FYI

## 2022-10-09 NOTE — Telephone Encounter (Signed)
Reason for Disposition  [1] Looks infected (spreading redness, pus) AND [2] large red area (> 2 in. or 5 cm)  Answer Assessment - Initial Assessment Questions 1. ONSET: "When did the pain start?"      I went to urgent care last week and they gave me a shot of Toradol and prednisone but it did not help my foot.  Irritation to the muscles is what is on my paper.   No injury to my foot.    The day before I went to the urgent care I had a cramp in the top of my feet.   My toes spread out and I pushed my toes back together.  My toe is very swollen and the top of my foot is swollen too.   It hurts too.   I have redness and knot on the top of my toe.  A week ago I went to the urgent care on Friday or Sat.  2 2. LOCATION: "Where is the pain located?"      My left middle toe.   3. PAIN: "How bad is the pain?"    (Scale 1-10; or mild, moderate, severe)  - MILD (1-3): doesn't interfere with normal activities.   - MODERATE (4-7): interferes with normal activities (e.g., work or school) or awakens from sleep, limping.   - SEVERE (8-10): excruciating pain, unable to do any normal activities, unable to walk.      Severe pain 4. WORK OR EXERCISE: "Has there been any recent work or exercise that involved this part of the body?"      No injuries 5. CAUSE: "What do you think is causing the foot pain?"     I don't know 6. OTHER SYMPTOMS: "Do you have any other symptoms?" (e.g., leg pain, rash, fever, numbness)     Redness, swelling and pain.   I'm taking ibuprofen and Tylenol but it's not helping.    When I get up to walk the top of my foot swells up and the toe is red and has a knot on it and is swollen. 7. PREGNANCY: "Is there any chance you are pregnant?" "When was your last menstrual period?"     N/A  Protocols used: Foot Pain-A-AH

## 2022-10-09 NOTE — ED Provider Notes (Signed)
[facility]    CSN: [id number] Arrival date & time: 10/09/22  1101      History   Chief Complaint Chief Complaint  Patient presents with   Foot Pain    HPI [name] is a 62 y.o. male.   History of Present Illness  [name] is a 62 y.o. male patient that complains of pain in the anterior aspect of the left foot as well as the 2nd toe, 3rd toe, and 4th toe. Onset of symptoms was gradual starting about 3 weeks ago. Patient denies any injury. Pain initially started as a pulling sensation which was thought to be muscular. He now describes the pain as aching, sharp/stabbing, throbbing, and tingling. Pain severity at its worst is 9 /10. Pain is aggravated by movement, weight bearing, and palpation. Pain is minimally and temporary alleviated by rest, NSAIDS, elevation, compression sock, and ice. Patient denies any numbness, weakness, loss of sensation, loss of motion, or inability to bear weight. He was seen at the urgent care on 09/29/22 for the same. He was given an injection of Toradol and sent home on a steroid taper. Patient reports compliance with medication therapy as well as supportive care measures. He reports that the swelling has improved but the pain is unchanged. He now has a red blister like lesion to the third toe that he noted one day ago. He denies any systemic symptoms such as fevers or myalgias. He denies any history of gout.         Past Medical History:  Diagnosis Date   Alcohol abuse    Allergy    Anxiety    Arthritis    Diverticulosis of colon (without mention of hemorrhage)    GERD (gastroesophageal reflux disease)    Gout    History of kidney stones    Hyperlipemia    Hypertension    dr Kathlene November  norins   Kidney stones    Nephrolithiasis    Pancreatitis    Hx of    Ureteral stent displacement [facility]) 1990    Patient Active Problem List   Diagnosis Date Noted   Chest pain of uncertain etiology 07/24/2019   Alcoholic pancreatitis  05/20/2018   Normal anion gap metabolic acidosis 05/20/2018   Radiculopathy 03/27/2018   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/02/2017   Renal stone 10/08/2016   Non-compliant behavior 12/02/2013   Routine health maintenance 01/27/2012   Hyperlipidemia 01/27/2012   Knee pain, left 11/12/2011   BPH (benign prostatic hyperplasia) 11/12/2011   GERD 08/10/2008   Essential hypertension 04/14/2008   Alcohol abuse 10/26/2007   TRIGGER FINGER, LEFT MIDDLE 05/12/2007   NEPHROLITHIASIS, HX OF 05/12/2007    Past Surgical History:  Procedure Laterality Date   A2 pulley flexor sheath cyst 4th finger excisional biopsy     '03   ANTERIOR CRUCIATE LIGAMENT REPAIR     R knee   ESOPHAGOGASTRODUODENOSCOPY N/A 07/09/2012   Procedure: ESOPHAGOGASTRODUODENOSCOPY (EGD);  Surgeon: Louis Meckel, MD;  Location: [facility];  Service: Endoscopy;  Laterality: N/A;   KNEE SURGERY  2008   LEFT HEART CATH AND CORONARY ANGIOGRAPHY N/A 08/10/2019   Procedure: LEFT HEART CATH AND CORONARY ANGIOGRAPHY;  Surgeon: Runell Gess, MD;  Location: [facility];  Service: Cardiovascular;  Laterality: N/A;   SHOULDER ARTHROSCOPY WITH SUBACROMIAL DECOMPRESSION AND OPEN ROTATOR C Right 12/26/2012   Procedure: RIGHT SHOULDER ARTHROSCOPY WITH SUBACROMIAL DECOMPRESSION MINI OPEN ROTATOR CUFF REPAIR, OPEN BICEP TENODESIS OPEN DISTAL CLAVICLE RESECTION  ;  Surgeon: Verlee Rossetti, MD;  Location: [facility] OR;  Service: Orthopedics;  Laterality: Right;   TRANSFORAMINAL LUMBAR INTERBODY FUSION (TLIF) WITH PEDICLE SCREW FIXATION 1 LEVEL Right 03/27/2018   Procedure: RIGHT-SIDED LUMBAR 4-5 TRANSFORAMINAL LUMBAR INTERBODY FUSION WITH INSTRUMENTATION AND ALLOGRAFT;  Surgeon: Estill Bamberg, MD;  Location: [facility] OR;  Service: Orthopedics;  Laterality: Right;   URETEROSCOPY     and stone retreval       Home Medications    Prior to Admission medications   Medication Sig Start Date End Date Taking? Authorizing Provider  naproxen  (NAPROSYN) 500 MG tablet Take 1 tablet (500 mg total) by mouth 2 (two) times daily with a meal for 10 days. 10/09/22 10/19/22 Yes Lurline Idol, FNP  sulfamethoxazole-trimethoprim (BACTRIM DS) 800-160 MG tablet Take 1 tablet by mouth 2 (two) times daily for 7 days. 10/09/22 10/16/22 Yes Lurline Idol, FNP  amLODipine (NORVASC) 10 MG tablet Take 1 tablet (10 mg total) by mouth daily. 01/10/22   Grayce Sessions, NP  atorvastatin (LIPITOR) 40 MG tablet Take 1 tablet (40 mg total) by mouth daily. 08/22/22   Ronney Asters, NP  carvedilol (COREG) 25 MG tablet Take 1 tablet (25 mg total) by mouth 2 (two) times daily with a meal. 01/10/22   Grayce Sessions, NP  cyclobenzaprine (FLEXERIL) 10 MG tablet Take 1 tablet by mouth three times daily as needed for muscle spasm 08/09/21   Grayce Sessions, NP  losartan (COZAAR) 25 MG tablet Take 1 tablet (25 mg total) by mouth daily. 08/23/22   Ronney Asters, NP  Melatonin 10 MG CAPS Take 10 mg by mouth at bedtime as needed. 01/10/22   Grayce Sessions, NP  Multiple Vitamins-Minerals (MULTIVITAMIN WITH MINERALS) tablet Take 1 tablet by mouth daily.    [provider]  omeprazole (PRILOSEC) 40 MG capsule Take 40 mg by mouth daily. 08/30/21   [provider]  oxyCODONE-acetaminophen (PERCOCET) 10-325 MG tablet Take 1 tablet by mouth 3 (three) times daily as needed. 08/31/21   [provider]  potassium chloride SA (KLOR-CON M) 20 MEQ tablet Take 1 tablet (20 mEq total) by mouth daily. 09/21/21   Hilarie Fredrickson, MD  Rimegepant Sulfate (NURTEC) 75 MG TBDP Take 75 mg by mouth daily as needed. 09/19/20   Penumalli, Glenford Bayley, MD  rizatriptan (MAXALT-MLT) 10 MG disintegrating tablet Take 1 tablet (10 mg total) by mouth as needed for migraine. May repeat in 2 hours if needed 09/19/20   Penumalli, Glenford Bayley, MD  Tetrahydrozoline HCl (VISINE OP) Place 1 drop into both eyes daily as needed (redness).    [provider]  triamcinolone cream  (KENALOG) 0.1 % Apply 1 application topically 2 (two) times daily. Patient taking differently: Apply 1 application  topically as needed. 09/08/19   Bing Neighbors, NP  diphenhydrAMINE (BENADRYL) 25 MG tablet Take 1 tablet (25 mg total) by mouth every 6 (six) hours as needed. 04/04/20 06/17/20  Arby Barrette, MD  metoCLOPramide (REGLAN) 10 MG tablet Take 1 tablet (10 mg total) by mouth every 6 (six) hours as needed for nausea (nausea/headache). Take this tablet with 25 mg of Benadryl and two extra strength Tylenol for headache. 04/27/20 06/17/20  Grayce Sessions, NP    Family History Family History  Problem Relation Age of Onset   Diabetes Mother        x2 brothers   Hypertension Father        X4 siblings   Heart disease Father  Colon cancer Neg Hx    Esophageal cancer Neg Hx     Social History Social History   Tobacco Use   Smoking status: Former    Types: Cigarettes    Quit date: 07/10/1998    Years since quitting: 24.2   Smokeless tobacco: Never  Vaping Use   Vaping Use: Never used  Substance Use Topics   Alcohol use: Yes    Alcohol/week: 6.0 standard drinks of alcohol    Types: 6 Cans of beer per week   Drug use: No     Allergies   Hydrocodone-acetaminophen and Shrimp [shellfish allergy]   Review of Systems Review of Systems  Constitutional:  Negative for chills, fatigue and fever.  Gastrointestinal:  Negative for nausea and vomiting.  Musculoskeletal:  Negative for gait problem.       Left foot pain   Neurological:  Negative for weakness and numbness.  All other systems reviewed and are negative.    Physical Exam Triage Vital Signs ED Triage Vitals  Enc Vitals Group     BP 10/09/22 1122 123/70     Pulse Rate 10/09/22 1122 81     Resp 10/09/22 1122 18     Temp 10/09/22 1122 98.4 F (36.9 C)     Temp Source 10/09/22 1122 Oral     SpO2 10/09/22 1122 97 %     Weight --      Height --      Head Circumference --      Peak Flow --      Pain Score  10/09/22 1123 8     Pain Loc --      Pain Edu? --      Excl. in GC? --    No data found.  Updated Vital Signs BP 123/70 (BP Location: Left Arm)   Pulse 81   Temp 98.4 F (36.9 C) (Oral)   Resp 18   SpO2 97%   Visual Acuity Right Eye Distance:   Left Eye Distance:   Bilateral Distance:    Right Eye Near:   Left Eye Near:    Bilateral Near:     Physical Exam Vitals reviewed.  Constitutional:      General: He is not in acute distress.    Appearance: Normal appearance. He is not ill-appearing, toxic-appearing or diaphoretic.  Cardiovascular:     Rate and Rhythm: Normal rate and regular rhythm.     Pulses:          Dorsalis pedis pulses are 2+ on the left side.       Posterior tibial pulses are 2+ on the left side.  Pulmonary:     Effort: Pulmonary effort is normal.     Breath sounds: Normal breath sounds.  Musculoskeletal:        General: Normal range of motion.     Cervical back: Normal range of motion and neck supple.     Left foot: Normal range of motion and normal capillary refill. Tenderness present. No swelling, deformity or foot drop.  Feet:     Left foot:     Skin integrity: Blister present. No skin breakdown, erythema or warmth.     Toenail Condition: Fungal disease present.    Comments: See pictures below  Skin:    General: Skin is warm and dry.  Neurological:     General: No focal deficit present.     Mental Status: He is alert and oriented to person, place, and time.  Psychiatric:  Mood and Affect: Mood normal.        Behavior: Behavior normal.          UC Treatments / Results  Labs (all labs ordered are listed, but only abnormal results are displayed) Labs Reviewed  URIC ACID  SEDIMENTATION RATE  C-REACTIVE PROTEIN    EKG   Radiology No results found.  Procedures Procedures (including critical care time)  Medications Ordered in UC Medications - No data to display  Initial Impression / Assessment and Plan / UC Course  I  have reviewed the triage vital signs and the nursing notes.  Pertinent labs & imaging results that were available during my care of the patient were reviewed by me and considered in my medical decision making (see chart for details).    62 yo male presenting with a three-week history of left foot pain and a lesion to the third toe. No fevers. No injury.  Was seen for the same on 09/29/2022.  He was given injection of Toradol and sent home on a steroid taper.  Patient was compliant with medication therapy as well as supportive care measures.  Swelling has improved but the pain is unchanged.  He denies a history of gout.  No history of diabetes.  Patient is afebrile and nontoxic.  Physical exam as above.  X-ray of the foot pending.  Uric acid, C-reactive protein and sed rate also pending.  Will prescribe naproxen as well as Bactrim for possible infection/early cellulitis.  Continued supportive care measures vies.  Patient has a follow-up with PCP next week and encouraged to keep this appointment.  He may need podiatry follow-up if symptoms fail to improve.  Today's evaluation has revealed no signs of a dangerous process. Discussed diagnosis with patient and/or guardian. Patient and/or guardian aware of their diagnosis, possible red flag symptoms to watch out for and need for close follow up. Patient and/or guardian understands verbal and written discharge instructions. Patient and/or guardian comfortable with plan and disposition.  Patient and/or guardian has a clear mental status at this time, good insight into illness (after discussion and teaching) and has clear judgment to make decisions regarding their care  Documentation was completed with the aid of voice recognition software. Transcription may contain typographical errors. Final Clinical Impressions(s) / UC Diagnoses   Final diagnoses:  Left foot pain  Sore on toe     Discharge Instructions      You have been seen today for your foot pain.  Many things can cause foot pain. The exact cause of your foot pain is unknown at this time. XR of your foot as well as blood work has been done. Results are currently pending. Please check your MyChart to review the results. You will receive a call if anything is abnormal and/or if a change in therapy is needed. Take medications as prescribed. Continue supportive care measures. Follow-up with your primary care provider next week as scheduled. She may need to refer you to podiatry.   Do not stand or walk for long periods. Do stretches to relieve foot pain and stiffness  Do not lift anything that is heavier than 10 lb. Lifting a lot of weight can put added pressure on your feet. Wear comfortable, supportive shoes that fit you well.  Keep your feet clean and dry.     ED Prescriptions     Medication Sig Dispense Auth. Provider   naproxen (NAPROSYN) 500 MG tablet Take 1 tablet (500 mg total) by mouth 2 (two) times  daily with a meal for 10 days. 20 tablet Lurline Idol, FNP   sulfamethoxazole-trimethoprim (BACTRIM DS) 800-160 MG tablet Take 1 tablet by mouth 2 (two) times daily for 7 days. 14 tablet Lurline Idol, FNP      PDMP not reviewed this encounter.   Lurline Idol, [place] 10/09/22 1240

## 2022-10-09 NOTE — ED Triage Notes (Signed)
Pt here for left foot pain x 1 week; was seen here for same last week and sts no improvement

## 2022-10-10 ENCOUNTER — Telehealth (HOSPITAL_COMMUNITY): Payer: Self-pay | Admitting: Emergency Medicine

## 2022-10-10 LAB — SEDIMENTATION RATE: Sed Rate: 15 mm/hr (ref 0–30)

## 2022-10-10 LAB — URIC ACID: Uric Acid: 6.9 mg/dL (ref 3.8–8.4)

## 2022-10-10 LAB — C-REACTIVE PROTEIN: CRP: 4 mg/L (ref 0–10)

## 2022-10-10 NOTE — Telephone Encounter (Signed)
Patient left a voicemail stating he left before getting to review his results yesterday, and the toe he was complaining about has "now busted open and is bleeding and oozing puss and stuff". So he now wants to know what all of his results are (mainly xray) and what he needs to do next.   Awaiting provider review

## 2022-10-10 NOTE — Telephone Encounter (Signed)
Per Lelon Mast, APP "All labs and XR was normal. He should continue the prescribed antibiotics which will cover infection of the toe. Clean toe with antibacterial soap and water. Do not put anything else on wound. Keep wound covered while draining. Continue other medication for pain. Return to clinic or go to ER if developing fever or increased redness/swelling. Otherwise, follow up with PCP next week as scheduled. "  Reviewed with patient, all questions answered

## 2022-10-12 ENCOUNTER — Emergency Department (HOSPITAL_COMMUNITY)
Admission: EM | Admit: 2022-10-12 | Discharge: 2022-10-12 | Disposition: A | Discharge status: Home / Self Care | Payer: 59 | Attending: Emergency Medicine | Admitting: Emergency Medicine

## 2022-10-12 ENCOUNTER — Other Ambulatory Visit: Payer: Self-pay

## 2022-10-12 DIAGNOSIS — Z79899 Other long term (current) drug therapy: Secondary | ICD-10-CM | POA: Diagnosis not present

## 2022-10-12 DIAGNOSIS — M7989 Other specified soft tissue disorders: Secondary | ICD-10-CM | POA: Insufficient documentation

## 2022-10-12 DIAGNOSIS — I10 Essential (primary) hypertension: Secondary | ICD-10-CM | POA: Insufficient documentation

## 2022-10-12 DIAGNOSIS — L089 Local infection of the skin and subcutaneous tissue, unspecified: Secondary | ICD-10-CM

## 2022-10-12 LAB — CBC
HCT: 37.9 % — ABNORMAL LOW (ref 39.0–52.0)
Hemoglobin: 12.1 g/dL — ABNORMAL LOW (ref 13.0–17.0)
MCH: 30.6 pg (ref 26.0–34.0)
MCHC: 31.9 g/dL (ref 30.0–36.0)
MCV: 95.7 fL (ref 80.0–100.0)
Platelets: 216 10*3/uL (ref 150–400)
RBC: 3.96 MIL/uL — ABNORMAL LOW (ref 4.22–5.81)
RDW: 12.6 % (ref 11.5–15.5)
WBC: 10.9 10*3/uL — ABNORMAL HIGH (ref 4.0–10.5)
nRBC: 0 % (ref 0.0–0.2)

## 2022-10-12 LAB — BASIC METABOLIC PANEL
Anion gap: 12 (ref 5–15)
BUN: 19 mg/dL (ref 6–20)
CO2: 23 mmol/L (ref 22–32)
Calcium: 9.4 mg/dL (ref 8.9–10.3)
Chloride: 104 mmol/L (ref 98–111)
Creatinine, Ser: 1.42 mg/dL — ABNORMAL HIGH (ref 0.61–1.24)
GFR, Estimated: 57 mL/min — ABNORMAL LOW (ref >60)
Glucose, Bld: 118 mg/dL — ABNORMAL HIGH (ref 70–99)
Potassium: 3.7 mmol/L (ref 3.5–5.1)
Sodium: 139 mmol/L (ref 135–145)

## 2022-10-12 MED ORDER — SODIUM CHLORIDE 0.9 % IV SOLN
1.0000 g | Freq: Once | INTRAVENOUS | Status: AC
  Administered 2022-10-12: 1 g via INTRAVENOUS
  Filled 2022-10-12: qty 10

## 2022-10-12 MED ORDER — AMOXICILLIN-POT CLAVULANATE 875-125 MG PO TABS: 1.0000 | ORAL_TABLET | Freq: Two times a day (BID) | ORAL | 0 refills | Status: DC

## 2022-10-12 MED ORDER — BACITRACIN ZINC 500 UNIT/GM EX OINT
TOPICAL_OINTMENT | Freq: Once | CUTANEOUS | Status: AC
  Administered 2022-10-12: 1 via TOPICAL
  Filled 2022-10-12: qty 0.9

## 2022-10-12 MED ORDER — SODIUM CHLORIDE 0.9 % IV BOLUS
1000.0000 mL | Freq: Once | INTRAVENOUS | Status: AC
  Administered 2022-10-12: 1000 mL via INTRAVENOUS

## 2022-10-12 NOTE — ED Notes (Signed)
Wound irrigated and cleaned, nonadherent dressing placed over bacitracin and covered with guaze. IV abx infusing, see mar.

## 2022-10-12 NOTE — ED Provider Notes (Signed)
[place] EMERGENCY DEPARTMENT AT [facility] Provider Note   CSN: [id number] Arrival date & time: 10/12/22  1804     History  Chief Complaint  Patient presents with   Wound Infection    Michial Breheny is a 62 y.o. male.  HPI   Patient has a history of pancreatitis hyperlipidemia reflux arthritis, alcohol abuse kidney stones hypertension.Patient presents to the ED for evaluation of persistent sore on his left toe.  Patient states symptoms started a couple weeks ago.  He was seen in urgent care on May 25.  Patient was started on prednisone.  He did not feel like he was getting any better.  He followed up with an urgent care on June 4.  Patient continued to have a sharp pain and then developed a lesion on his toe.  Patient states he subsequently developed a scab and it has been draining.  Patient's was started on antibiotic.  He has been soaking his foot but does not feel like it is getting better  Home Medications Prior to Admission medications   Medication Sig Start Date End Date Taking? Authorizing Provider  amoxicillin-clavulanate (AUGMENTIN) 875-125 MG tablet Take 1 tablet by mouth every 12 (twelve) hours. 10/12/22  Yes Linwood Dibbles, MD  amLODipine (NORVASC) 10 MG tablet Take 1 tablet (10 mg total) by mouth daily. 01/10/22   Grayce Sessions, NP  atorvastatin (LIPITOR) 40 MG tablet Take 1 tablet (40 mg total) by mouth daily. 08/22/22   Ronney Asters, NP  carvedilol (COREG) 25 MG tablet Take 1 tablet (25 mg total) by mouth 2 (two) times daily with a meal. 01/10/22   Grayce Sessions, NP  cyclobenzaprine (FLEXERIL) 10 MG tablet Take 1 tablet by mouth three times daily as needed for muscle spasm 08/09/21   Grayce Sessions, NP  losartan (COZAAR) 25 MG tablet Take 1 tablet (25 mg total) by mouth daily. 08/23/22   Ronney Asters, NP  Melatonin 10 MG CAPS Take 10 mg by mouth at bedtime as needed. 01/10/22   Grayce Sessions, NP  Multiple Vitamins-Minerals (MULTIVITAMIN WITH  MINERALS) tablet Take 1 tablet by mouth daily.    [provider]  naproxen (NAPROSYN) 500 MG tablet Take 1 tablet (500 mg total) by mouth 2 (two) times daily with a meal for 10 days. 10/09/22 10/19/22  Lurline Idol, FNP  omeprazole (PRILOSEC) 40 MG capsule Take 40 mg by mouth daily. 08/30/21   [provider]  oxyCODONE-acetaminophen (PERCOCET) 10-325 MG tablet Take 1 tablet by mouth 3 (three) times daily as needed. 08/31/21   [provider]  potassium chloride SA (KLOR-CON M) 20 MEQ tablet Take 1 tablet (20 mEq total) by mouth daily. 09/21/21   Hilarie Fredrickson, MD  Rimegepant Sulfate (NURTEC) 75 MG TBDP Take 75 mg by mouth daily as needed. 09/19/20   Penumalli, Glenford Bayley, MD  rizatriptan (MAXALT-MLT) 10 MG disintegrating tablet Take 1 tablet (10 mg total) by mouth as needed for migraine. May repeat in 2 hours if needed 09/19/20   Penumalli, Glenford Bayley, MD  sulfamethoxazole-trimethoprim (BACTRIM DS) 800-160 MG tablet Take 1 tablet by mouth 2 (two) times daily for 7 days. 10/09/22 10/16/22  Lurline Idol, FNP  Tetrahydrozoline HCl (VISINE OP) Place 1 drop into both eyes daily as needed (redness).    [provider]  triamcinolone cream (KENALOG) 0.1 % Apply 1 application topically 2 (two) times daily. Patient taking differently: Apply 1 application  topically as needed. 09/08/19  Bing Neighbors, NP  diphenhydrAMINE (BENADRYL) 25 MG tablet Take 1 tablet (25 mg total) by mouth every 6 (six) hours as needed. 04/04/20 06/17/20  Arby Barrette, MD  metoCLOPramide (REGLAN) 10 MG tablet Take 1 tablet (10 mg total) by mouth every 6 (six) hours as needed for nausea (nausea/headache). Take this tablet with 25 mg of Benadryl and two extra strength Tylenol for headache. 04/27/20 06/17/20  Grayce Sessions, NP      Allergies    Hydrocodone-acetaminophen and Shrimp [shellfish allergy]    Review of Systems   Review of Systems  Physical Exam Updated Vital Signs BP 125/81    Pulse 60   Temp 98.4 F (36.9 C) (Oral)   Resp 18   Ht 1.829 m (6')   Wt 117.9 kg   SpO2 100%   BMI 35.26 kg/m  Physical Exam Vitals and nursing note reviewed.  Constitutional:      General: He is not in acute distress.    Appearance: He is well-developed.  HENT:     Head: Normocephalic and atraumatic.     Right Ear: External ear normal.     Left Ear: External ear normal.  Eyes:     General: No scleral icterus.       Right eye: No discharge.        Left eye: No discharge.     Conjunctiva/sclera: Conjunctivae normal.  Neck:     Trachea: No tracheal deviation.  Cardiovascular:     Rate and Rhythm: Normal rate.  Pulmonary:     Effort: Pulmonary effort is normal. No respiratory distress.     Breath sounds: No stridor.  Abdominal:     General: There is no distension.  Musculoskeletal:        General: Tenderness present. No swelling or deformity.     Cervical back: Neck supple.     Comments: Scabs surrounded with some purulent drainage on the left fourth toe, no lymphangitic streaking, no cyanosis, foot is warm and well-perfused, cap refill brisk  Skin:    General: Skin is warm and dry.     Findings: No rash.  Neurological:     Mental Status: He is alert. Mental status is at baseline.     Cranial Nerves: No dysarthria or facial asymmetry.     Motor: No seizure activity.     ED Results / Procedures / Treatments   Labs (all labs ordered are listed, but only abnormal results are displayed) Labs Reviewed  CBC - Abnormal; Notable for the following components:      Result Value   WBC 10.9 (*)    RBC 3.96 (*)    Hemoglobin 12.1 (*)    HCT 37.9 (*)    All other components within normal limits  BASIC METABOLIC PANEL - Abnormal; Notable for the following components:   Glucose, Bld 118 (*)    Creatinine, Ser 1.42 (*)    GFR, Estimated 57 (*)    All other components within normal limits    EKG None  Radiology No results found.  Procedures Procedures     Medications Ordered in ED Medications  bacitracin ointment (1 Application Topical Given 10/12/22 2134)  cefTRIAXone (ROCEPHIN) 1 g in sodium chloride 0.9 % 100 mL IVPB (1 g Intravenous New Bag/Given 10/12/22 2134)  sodium chloride 0.9 % bolus 1,000 mL (1,000 mLs Intravenous New Bag/Given 10/12/22 2156)    ED Course/ Medical Decision Making/ A&P Clinical Course as of 10/12/22 2249  Fri Oct 12, 2022  2116 Leukocytosis slightly increased compared to previous, creatinine increased compared to previous [JK]    Clinical Course User Index [JK] Linwood Dibbles, MD                             Medical Decision Making Problems Addressed: Toe infection: acute illness or injury that poses a threat to life or bodily functions  Amount and/or Complexity of Data Reviewed Labs: ordered. Decision-making details documented in ED Course.  Risk OTC drugs. Prescription drug management.  Patient presents ED for evaluation of persistent toe infection.  Patient was seen previously treated for gout.  Subsequently started on antibiotics.  He does have a scab with some purulent drainage around it.  Does not have any evidence of lymphangitic streaking.  No signs of systemic infection.  Will have patient's start taking Augmentin.  Outpatient follow-up with podiatry.  PDMP reviewed and patient does have oxycodone at home for pain         Final Clinical Impression(s) / ED Diagnoses Final diagnoses:  Toe infection    Rx / DC Orders ED Discharge Orders          Ordered    amoxicillin-clavulanate (AUGMENTIN) 875-125 MG tablet  Every 12 hours        10/12/22 2246              Linwood Dibbles, MD 10/12/22 2249

## 2022-10-12 NOTE — ED Triage Notes (Signed)
Pt has purulent wound for 1x week on middle toe of L foot. Pt has taken abx w/no improvement. Pain w/weight bearing.

## 2022-10-12 NOTE — Discharge Instructions (Signed)
Start taking the additional antibiotic for your toe infection.  Continue with the soaks.  Follow-up with the podiatrist for further evaluation

## 2022-10-12 NOTE — ED Notes (Signed)
Patient given discharge instructions and follow up care. Patient verbalized understanding. IV removed with catheter intact. Patient ambulatory out of ED by self. 

## 2022-10-16 ENCOUNTER — Ambulatory Visit: Payer: 59 | Admitting: Podiatry

## 2022-10-18 ENCOUNTER — Ambulatory Visit (INDEPENDENT_AMBULATORY_CARE_PROVIDER_SITE_OTHER): Payer: 59 | Admitting: Primary Care

## 2022-10-18 ENCOUNTER — Encounter (INDEPENDENT_AMBULATORY_CARE_PROVIDER_SITE_OTHER): Payer: Self-pay | Admitting: Primary Care

## 2022-10-18 VITALS — BP 115/72 | HR 61 | Resp 16 | Wt 263.0 lb

## 2022-10-18 DIAGNOSIS — I10 Essential (primary) hypertension: Secondary | ICD-10-CM | POA: Diagnosis not present

## 2022-10-18 DIAGNOSIS — L089 Local infection of the skin and subcutaneous tissue, unspecified: Secondary | ICD-10-CM | POA: Diagnosis not present

## 2022-10-18 DIAGNOSIS — Z760 Encounter for issue of repeat prescription: Secondary | ICD-10-CM | POA: Diagnosis not present

## 2022-10-18 MED ORDER — CARVEDILOL 25 MG PO TABS
25.0000 mg | ORAL_TABLET | Freq: Two times a day (BID) | ORAL | 1 refills | Status: DC
Start: 2022-10-18 — End: 2023-09-05

## 2022-10-18 MED ORDER — AMLODIPINE BESYLATE 10 MG PO TABS: 10.0000 mg | ORAL_TABLET | Freq: Every day | ORAL | 1 refills | Status: DC

## 2022-10-18 NOTE — Patient Instructions (Signed)
Calorie Counting for Weight Loss Calories are units of energy. Your body needs a certain number of calories from food to keep going throughout the day. When you eat or drink more calories than your body needs, your body stores the extra calories mostly as fat. When you eat or drink fewer calories than your body needs, your body burns fat to get the energy it needs. Calorie counting means keeping track of how many calories you eat and drink each day. Calorie counting can be helpful if you need to lose weight. If you eat fewer calories than your body needs, you should lose weight. Ask your health care provider what a healthy weight is for you. For calorie counting to work, you will need to eat the right number of calories each day to lose a healthy amount of weight per week. A dietitian can help you figure out how many calories you need in a day and will suggest ways to reach your calorie goal. A healthy amount of weight to lose each week is usually 1-2 lb (0.5-0.9 kg). This usually means that your daily calorie intake should be reduced by 500-750 calories. Eating 1,200-1,500 calories a day can help most women lose weight. Eating 1,500-1,800 calories a day can help most men lose weight. What do I need to know about calorie counting? Work with your health care provider or dietitian to determine how many calories you should get each day. To meet your daily calorie goal, you will need to: Find out how many calories are in each food that you would like to eat. Try to do this before you eat. Decide how much of the food you plan to eat. Keep a food log. Do this by writing down what you ate and how many calories it had. To successfully lose weight, it is important to balance calorie counting with a healthy lifestyle that includes regular activity. Where do I find calorie information?  The number of calories in a food can be found on a Nutrition Facts label. If a food does not have a Nutrition Facts label, try  to look up the calories online or ask your dietitian for help. Remember that calories are listed per serving. If you choose to have more than one serving of a food, you will have to multiply the calories per serving by the number of servings you plan to eat. For example, the label on a package of bread might say that a serving size is 1 slice and that there are 90 calories in a serving. If you eat 1 slice, you will have eaten 90 calories. If you eat 2 slices, you will have eaten 180 calories. How do I keep a food log? After each time that you eat, record the following in your food log as soon as possible: What you ate. Be sure to include toppings, sauces, and other extras on the food. How much you ate. This can be measured in cups, ounces, or number of items. How many calories were in each food and drink. The total number of calories in the food you ate. Keep your food log near you, such as in a pocket-sized notebook or on an app or website on your mobile phone. Some programs will calculate calories for you and show you how many calories you have left to meet your daily goal. What are some portion-control tips? Know how many calories are in a serving. This will help you know how many servings you can have of a certain   food. Use a measuring cup to measure serving sizes. You could also try weighing out portions on a kitchen scale. With time, you will be able to estimate serving sizes for some foods. Take time to put servings of different foods on your favorite plates or in your favorite bowls and cups so you know what a serving looks like. Try not to eat straight from a food's packaging, such as from a bag or box. Eating straight from the package makes it hard to see how much you are eating and can lead to overeating. Put the amount you would like to eat in a cup or on a plate to make sure you are eating the right portion. Use smaller plates, glasses, and bowls for smaller portions and to prevent  overeating. Try not to multitask. For example, avoid watching TV or using your computer while eating. If it is time to eat, sit down at a table and enjoy your food. This will help you recognize when you are full. It will also help you be more mindful of what and how much you are eating. What are tips for following this plan? Reading food labels Check the calorie count compared with the serving size. The serving size may be smaller than what you are used to eating. Check the source of the calories. Try to choose foods that are high in protein, fiber, and vitamins, and low in saturated fat, trans fat, and sodium. Shopping Read nutrition labels while you shop. This will help you make healthy decisions about which foods to buy. Pay attention to nutrition labels for low-fat or fat-free foods. These foods sometimes have the same number of calories or more calories than the full-fat versions. They also often have added sugar, starch, or salt to make up for flavor that was removed with the fat. Make a grocery list of lower-calorie foods and stick to it. Cooking Try to cook your favorite foods in a healthier way. For example, try baking instead of frying. Use low-fat dairy products. Meal planning Use more fruits and vegetables. One-half of your plate should be fruits and vegetables. Include lean proteins, such as chicken, turkey, and fish. Lifestyle Each week, aim to do one of the following: 150 minutes of moderate exercise, such as walking. 75 minutes of vigorous exercise, such as running. General information Know how many calories are in the foods you eat most often. This will help you calculate calorie counts faster. Find a way of tracking calories that works for you. Get creative. Try different apps or programs if writing down calories does not work for you. What foods should I eat?  Eat nutritious foods. It is better to have a nutritious, high-calorie food, such as an avocado, than a food with  few nutrients, such as a bag of potato chips. Use your calories on foods and drinks that will fill you up and will not leave you hungry soon after eating. Examples of foods that fill you up are nuts and nut butters, vegetables, lean proteins, and high-fiber foods such as whole grains. High-fiber foods are foods with more than 5 g of fiber per serving. Pay attention to calories in drinks. Low-calorie drinks include water and unsweetened drinks. The items listed above may not be a complete list of foods and beverages you can eat. Contact a dietitian for more information. What foods should I limit? Limit foods or drinks that are not good sources of vitamins, minerals, or protein or that are high in unhealthy fats. These   include: Candy. Other sweets. Sodas, specialty coffee drinks, alcohol, and juice. The items listed above may not be a complete list of foods and beverages you should avoid. Contact a dietitian for more information. How do I count calories when eating out? Pay attention to portions. Often, portions are much larger when eating out. Try these tips to keep portions smaller: Consider sharing a meal instead of getting your own. If you get your own meal, eat only half of it. Before you start eating, ask for a container and put half of your meal into it. When available, consider ordering smaller portions from the menu instead of full portions. Pay attention to your food and drink choices. Knowing the way food is cooked and what is included with the meal can help you eat fewer calories. If calories are listed on the menu, choose the lower-calorie options. Choose dishes that include vegetables, fruits, whole grains, low-fat dairy products, and lean proteins. Choose items that are boiled, broiled, grilled, or steamed. Avoid items that are buttered, battered, fried, or served with cream sauce. Items labeled as crispy are usually fried, unless stated otherwise. Choose water, low-fat milk,  unsweetened iced tea, or other drinks without added sugar. If you want an alcoholic beverage, choose a lower-calorie option, such as a glass of wine or light beer. Ask for dressings, sauces, and syrups on the side. These are usually high in calories, so you should limit the amount you eat. If you want a salad, choose a garden salad and ask for grilled meats. Avoid extra toppings such as bacon, cheese, or fried items. Ask for the dressing on the side, or ask for olive oil and vinegar or lemon to use as dressing. Estimate how many servings of a food you are given. Knowing serving sizes will help you be aware of how much food you are eating at restaurants. Where to find more information Centers for Disease Control and Prevention: www.cdc.gov U.[place]. Department of Agriculture: myplate.gov Summary Calorie counting means keeping track of how many calories you eat and drink each day. If you eat fewer calories than your body needs, you should lose weight. A healthy amount of weight to lose per week is usually 1-2 lb (0.5-0.9 kg). This usually means reducing your daily calorie intake by 500-750 calories. The number of calories in a food can be found on a Nutrition Facts label. If a food does not have a Nutrition Facts label, try to look up the calories online or ask your dietitian for help. Use smaller plates, glasses, and bowls for smaller portions and to prevent overeating. Use your calories on foods and drinks that will fill you up and not leave you hungry shortly after a meal. This information is not intended to replace advice given to you by your health care provider. Make sure you discuss any questions you have with your health care provider. Document Revised: 06/04/2019 Document Reviewed: 06/04/2019 Elsevier Patient Education  2023 Elsevier Inc.  

## 2022-10-18 NOTE — Progress Notes (Signed)
   Acute Office Visit  Subjective:     Patient ID: [name], male    DOB: 1960/10/05, 62 y.o.   MRN: [id number]  Chief Complaint  Patient presents with   Hospitalization Follow-up    ED/urgent care     HPI [name] is a 62 year old man who has been detox and no longer drinking alcohol. He went into treatment and has been sober for 6 weeks. He voices concerns about his 3rd (middle) toe on his left foot (open sore) seen by seen by ED/ UC treat both times with abt's. Labs and x-ray no etiology.  Patient has No headache, No chest pain, No abdominal pain - No Nausea, No new weakness tingling or numbness, No Cough - shortness of breath   ROS Comprehensive ROS Pertinent positive and negative noted in HPI       Objective:    Blood Pressure 115/72   Pulse 61   Respiration 16   Weight 263 lb (119.3 kg)   Oxygen Saturation 95%   Body Mass Index 35.67 kg/m   Physical Exam Vitals reviewed.  Constitutional:      Appearance: He is obese.  HENT:     Head: Normocephalic.     Right Ear: Tympanic membrane and external ear normal.     Left Ear: Tympanic membrane and external ear normal.     Nose: Nose normal.  Eyes:     Extraocular Movements: Extraocular movements intact.  Cardiovascular:     Rate and Rhythm: Normal rate and regular rhythm.  Pulmonary:     Effort: Pulmonary effort is normal.     Breath sounds: Normal breath sounds.  Abdominal:     General: Bowel sounds are normal. There is distension.     Palpations: Abdomen is soft.  Musculoskeletal:        General: Normal range of motion.     Cervical back: Normal range of motion and neck supple.     Comments: See HPI  Neurological:     Mental Status: He is oriented to person, place, and time.  Psychiatric:        Mood and Affect: Mood normal.        Behavior: Behavior normal.   Assessment & Plan:  Murriel was seen today for hospitalization follow-up.  Diagnoses and all orders for this visit:    Toe infection  -      Ambulatory referral to Podiatry  Medication refill -     amLODipine (NORVASC) 10 MG tablet; Take 1 tablet (10 mg total) by mouth daily. -     carvedilol (COREG) 25 MG tablet; Take 1 tablet (25 mg total) by mouth 2 (two) times daily with a meal.  Essential hypertension BP goal - < 130/80- 140/90 Explained that having normal blood pressure is the goal and medications are helping to get to goal and maintain normal blood pressure. DIET: Limit salt intake, read nutrition labels to check salt content, limit fried and high fatty foods  Avoid using multisymptom OTC cold preparations that generally contain sudafed which can rise BP. Consult with pharmacist on best cold relief products to use for persons with HTN EXERCISE Discussed incorporating exercise such as walking - 30 minutes most days of the week and can do in 10 minute intervals    -     carvedilol (COREG) 25 MG tablet; Take 1 tablet (25 mg total) by mouth 2 (two) times daily with a meal.   Grayce Sessions, NP

## 2022-10-25 ENCOUNTER — Ambulatory Visit: Payer: 59 | Admitting: Podiatry

## 2022-10-25 DIAGNOSIS — G6289 Other specified polyneuropathies: Secondary | ICD-10-CM | POA: Diagnosis not present

## 2022-10-25 DIAGNOSIS — M5410 Radiculopathy, site unspecified: Secondary | ICD-10-CM

## 2022-10-25 DIAGNOSIS — L97522 Non-pressure chronic ulcer of other part of left foot with fat layer exposed: Secondary | ICD-10-CM

## 2022-10-25 NOTE — Progress Notes (Signed)
Subjective:  Patient ID: [name], male    DOB: [birth year],  MRN: [id number]  Chief Complaint  Patient presents with   Foot Pain    Pt states when he stretches his feet stick and he has to pull them back down and on that day it swelled up and he went to urgent care 5/25/ and gave him RX which did not help so went back and they did a xray and gave him more RX and if there was no change to go to the ED which did not do nothing either but set him up to come here     62 y.o. male presents with concern for ulcer to the top of his left third toe.  He says it started weeks to a month ago.  He has been to the ED for this issue.  They gave him antibiotics initially and told to follow-up if getting worse or to go back to the emergency department which she did and then he went back to the emergency department they gave him more antibiotics and since then he has noticed the swelling and pain as well as some of the redness decreased.  He thinks the wound is getting slightly smaller at this point has been draining less.  He is not currently on antibiotics.  Patient is not sure how the wound started.  Does have neuropathy.  History of back surgery and back trauma that caused sciatic injury  Past Medical History:  Diagnosis Date   Alcohol abuse    Allergy    Anxiety    Arthritis    Diverticulosis of colon (without mention of hemorrhage)    GERD (gastroesophageal reflux disease)    Gout    History of kidney stones    Hyperlipemia    Hypertension    dr Kathlene November  norins   Kidney stones    Nephrolithiasis    Pancreatitis    Hx of    Ureteral stent displacement (HCC) 1990    Allergies  Allergen Reactions   Hydrocodone-Acetaminophen Palpitations   Shrimp [Shellfish Allergy] Other (See Comments)    Cholesterol level becomes very high. Patient reports sweating and itching.    ROS: Negative except as per HPI above  Objective:  General: AAO x3, NAD  Dermatological: Also present to the dorsal lateral  aspect of the third toe on the left foot.  Postdebridement the wound measures 0.9 x 0.5 x 0.2 cm.  There is healthy granular tissue base postdebridement of some overlying hyperkeratotic fibrotic tissue wound does bleed well upon debridement.  No erythema purulence or significant edema of the third toe  Vascular:  Dorsalis Pedis artery and Posterior Tibial artery pedal pulses are 2/4 bilateral.  Capillary fill time < 3 sec to all digits.   Neruologic: Grossly absent via light touch and protective sensations absent  Musculoskeletal: No gross boney pedal deformities bilateral. No pain, crepitus, or limitation noted with foot and ankle range of motion bilateral. Muscular strength 5/5 in all groups tested bilateral.  Gait: Unassisted, Nonantalgic.   No images are attached to the encounter.  Radiographs:  Reviewed prior radiographs emergency department no evidence of osseous erosion in the third toe on the left foot. Assessment:   1. Ulcer of toe of left foot, with fat layer exposed (HCC)   2. Radiculopathy, unspecified spinal region   3. Other polyneuropathy      Plan:  Patient was evaluated and treated and all questions answered.  Ulcer dorsal lateral aspect of the left  third toe -We discussed the etiology and factors that are a part of the wound healing process.  We also discussed the risk of infection both soft tissue and osteomyelitis from open ulceration.  Discussed the risk of limb loss if this happens or worsens. -Debridement as below. -Dressed with Betadine, DSD. -Continue home dressing changes daily with Betadine and adhesive bandage -Vascular testing deferred strong pedal pulses and wound bleeds well -HgbA1c: Deferred last check was in the 5 range -Last antibiotics: Previously on Augmentin.  Will defer further antibiotics at this time as no evidence of infection currently -Imaging:  reviewed prior radiographs from ED and no evidence of osseous erosion in the third  toe  Procedure: Excisional Debridement of Wound Rationale: Removal of non-viable soft tissue from the wound to promote healing.  Anesthesia: none Post-Debridement Wound Measurements: 0.9 cm x 0.5 cm x 0.2 cm  Type of Debridement: Sharp Excisional Tissue Removed: Non-viable soft tissue Depth of Debridement: subcutaneous tissue. Technique: Sharp excisional debridement to bleeding, viable wound base.  Dressing: Dry, sterile, compression dressing. Disposition: Patient tolerated procedure well.   Return in about 3 weeks (around 11/15/2022) for f/u L 3rd toe ulcer.       Return in about 3 weeks (around 11/15/2022) for f/u L 3rd toe ulcer.          Corinna Gab, DPM [facility] / [facility]

## 2022-11-15 ENCOUNTER — Ambulatory Visit (INDEPENDENT_AMBULATORY_CARE_PROVIDER_SITE_OTHER): Payer: MEDICAID | Admitting: Podiatry

## 2022-11-15 DIAGNOSIS — L97522 Non-pressure chronic ulcer of other part of left foot with fat layer exposed: Secondary | ICD-10-CM

## 2022-11-15 DIAGNOSIS — G6289 Other specified polyneuropathies: Secondary | ICD-10-CM | POA: Diagnosis not present

## 2022-11-15 DIAGNOSIS — M5410 Radiculopathy, site unspecified: Secondary | ICD-10-CM

## 2022-11-15 NOTE — Progress Notes (Signed)
Subjective:  Patient ID: [name], male    DOB: [birth year],  MRN: [id number]  Chief Complaint  Patient presents with   Wound Check    Ulcer to toe of left foot. Patient has been changing dressing daily and applying betadine to the area and covering with a band-aid.     62 y.o. male presents for follow up or ulcer to the top of his left third toe. State wound is doing better, has occasional shooting pain to the area but no drainage. Doing betadine and band aid dressing.  Past Medical History:  Diagnosis Date   Alcohol abuse    Allergy    Anxiety    Arthritis    Diverticulosis of colon (without mention of hemorrhage)    GERD (gastroesophageal reflux disease)    Gout    History of kidney stones    Hyperlipemia    Hypertension    dr Kathlene November  norins   Kidney stones    Nephrolithiasis    Pancreatitis    Hx of    Ureteral stent displacement (HCC) 1990    Allergies  Allergen Reactions   Hydrocodone-Acetaminophen Palpitations   Shrimp [Shellfish Allergy] Other (See Comments)    Cholesterol level becomes very high. Patient reports sweating and itching.    ROS: Negative except as per HPI above  Objective:  General: AAO x3, NAD  Dermatological: Also present to the dorsal lateral aspect of the third toe on the left foot.  At site of prior ulcer there is eschar but no open wound or drainage. No erythema.   Vascular:  Dorsalis Pedis artery and Posterior Tibial artery pedal pulses are 2/4 bilateral.  Capillary fill time < 3 sec to all digits.   Neruologic: Grossly absent via light touch and protective sensations absent  Musculoskeletal: No gross boney pedal deformities bilateral. No pain, crepitus, or limitation noted with foot and ankle range of motion bilateral. Muscular strength 5/5 in all groups tested bilateral.  Gait: Unassisted, Nonantalgic.   No images are attached to the encounter.  Radiographs:  Reviewed prior radiographs emergency department no evidence of osseous  erosion in the third toe on the left foot. Assessment:   1. Ulcer of toe of left foot, with fat layer exposed (HCC)   2. Radiculopathy, unspecified spinal region   3. Other polyneuropathy       Plan:  Patient was evaluated and treated and all questions answered.  Ulcer dorsal lateral aspect of the left third toe - Wound nearly resolved at this time, eschar overlying healthy skin -We discussed the etiology and factors that are a part of the wound healing process.  We also discussed the risk of infection both soft tissue and osteomyelitis from open ulceration.  Discussed the risk of limb loss if this happens or worsens. -Debridement deferred today -Dressed with Betadine, DSD. -Continue home dressing changes daily with Betadine and adhesive bandage -Vascular testing deferred strong pedal pulses and wound bleeds well -HgbA1c: Deferred last check was in the 5 range -Last antibiotics: Previously on Augmentin.  Will defer further antibiotics at this time as no evidence of infection currently -Imaging:  reviewed prior radiographs from ED and no evidence of osseous erosion in the third toe   Return in about 6 weeks (around 12/27/2022) for f/u L 3rd toe wound.         Corinna Gab, DPM [facility] / [facility]

## 2022-12-05 ENCOUNTER — Telehealth (INDEPENDENT_AMBULATORY_CARE_PROVIDER_SITE_OTHER): Payer: Self-pay | Admitting: Primary Care

## 2022-12-05 NOTE — Telephone Encounter (Addendum)
Patient called and stated that he is filling out important paperwork and needs to know when he was first started on his heart medication carvedilol (COREG) 25 MG tablet, the year or the year and the month. Patient needs a call back soon in regards to this. Please advise. Patients callback # [phone] or [phone]. Patient said please leave a vm if needed because this is important that he gets this information as soon as possible.

## 2022-12-27 ENCOUNTER — Ambulatory Visit: Payer: 59 | Admitting: Podiatry

## 2023-01-04 ENCOUNTER — Ambulatory Visit (INDEPENDENT_AMBULATORY_CARE_PROVIDER_SITE_OTHER): Payer: MEDICAID | Admitting: Podiatry

## 2023-01-04 DIAGNOSIS — Z91199 Patient's noncompliance with other medical treatment and regimen due to unspecified reason: Secondary | ICD-10-CM

## 2023-01-04 NOTE — Progress Notes (Signed)
No show for apt.

## 2023-01-21 ENCOUNTER — Encounter (INDEPENDENT_AMBULATORY_CARE_PROVIDER_SITE_OTHER): Payer: Self-pay

## 2023-01-21 ENCOUNTER — Ambulatory Visit (INDEPENDENT_AMBULATORY_CARE_PROVIDER_SITE_OTHER): Payer: 59 | Admitting: Primary Care

## 2023-02-26 ENCOUNTER — Other Ambulatory Visit (INDEPENDENT_AMBULATORY_CARE_PROVIDER_SITE_OTHER): Payer: Self-pay | Admitting: Primary Care

## 2023-02-26 NOTE — Telephone Encounter (Signed)
Medication Refill - Medication: omeprazole (PRILOSEC) 40 MG capsule   Has the patient contacted their pharmacy? Yes  (Agent: If yes, when and what did the pharmacy advise?)contact pcp, pt wants to use [facility] only for the Omeprazole but cause its less expensive a this location  Preferred Pharmacy (with phone number or street name): [facility] [address], [place], [place] [address] [phone]. Has the patient been seen for an appointment in the last year OR does the patient have an upcoming appointment? yes  Agent: Please be advised that RX refills may take up to 3 business days. We ask that you follow-up with your pharmacy.

## 2023-02-28 NOTE — Telephone Encounter (Signed)
Requested medication (s) are due for refill today:   Provider to review  Requested medication (s) are on the active medication list:   Yes as historical  Future visit scheduled:   No   Last ordered: 08/30/2021 historical   prescribed by Dr. Yancey Flemings with [facility]  Is Marcelino Duster taking over this medication or should it go to Dr. Marina Goodell?   Requested Prescriptions  Pending Prescriptions Disp Refills   omeprazole (PRILOSEC) 40 MG capsule      Sig: Take 1 capsule (40 mg total) by mouth daily.     Gastroenterology: Proton Pump Inhibitors Passed - 02/26/2023  2:57 PM      Passed - Valid encounter within last 12 months    Recent Outpatient Visits           4 months ago Toe infection   [place] [facility] Grayce Sessions, NP   8 months ago Essential hypertension   [place] [facility] Grayce Sessions, NP   1 year ago Screening for diabetes mellitus   [place] [facility] Grayce Sessions, NP   1 year ago Hospital discharge follow-up   [place] [facility] Grayce Sessions, NP   1 year ago Chronic bilateral low back pain, unspecified whether sciatica present   [place] [facility] Grayce Sessions, NP

## 2023-02-28 NOTE — Telephone Encounter (Signed)
Will forward to provider  

## 2023-03-05 ENCOUNTER — Telehealth (INDEPENDENT_AMBULATORY_CARE_PROVIDER_SITE_OTHER): Payer: Self-pay | Admitting: Primary Care

## 2023-03-05 NOTE — Telephone Encounter (Signed)
Patient called today for refill, 2nd request.

## 2023-03-05 NOTE — Telephone Encounter (Signed)
This is the 2nd request. First request on 02/26/23 refill encounter and routed to provider on 02/28/23, pending approval/denial from provider.

## 2023-03-05 NOTE — Telephone Encounter (Signed)
Medication Refill - Medication: omeprazole (PRILOSEC) 40 MG capsule [[id number]]  Has the patient contacted their pharmacy? Yes.   (Agent: If no, request that the patient contact the pharmacy for the refill. If patient does not wish to contact the pharmacy document the reason why and proceed with request.) (Agent: If yes, when and what did the pharmacy advise?)  Preferred Pharmacy (with phone number or street name):  [id number] [place], [place] - [address] AT [id number] [place] [address]YSAYT: [phone] Fax: [phone]: Not open 24 hours    Has the patient been seen for an appointment in the last year OR does the patient have an upcoming appointment? Yes.    Agent: Please be advised that RX refills may take up to 3 business days. We ask that you follow-up with your pharmacy.

## 2023-03-11 ENCOUNTER — Telehealth (INDEPENDENT_AMBULATORY_CARE_PROVIDER_SITE_OTHER): Payer: Self-pay | Admitting: Primary Care

## 2023-03-11 NOTE — Telephone Encounter (Signed)
Will forward to provider  

## 2023-03-11 NOTE — Telephone Encounter (Signed)
Called to confirm apt with pt. Will be present at apt.

## 2023-03-12 ENCOUNTER — Ambulatory Visit (INDEPENDENT_AMBULATORY_CARE_PROVIDER_SITE_OTHER): Payer: MEDICAID | Admitting: Primary Care

## 2023-03-12 ENCOUNTER — Encounter (INDEPENDENT_AMBULATORY_CARE_PROVIDER_SITE_OTHER): Payer: Self-pay | Admitting: Primary Care

## 2023-03-12 VITALS — BP 134/87 | HR 89 | Resp 16 | Wt 259.8 lb

## 2023-03-12 DIAGNOSIS — Z2821 Immunization not carried out because of patient refusal: Secondary | ICD-10-CM

## 2023-03-12 DIAGNOSIS — K219 Gastro-esophageal reflux disease without esophagitis: Secondary | ICD-10-CM | POA: Diagnosis not present

## 2023-03-12 DIAGNOSIS — Z79899 Other long term (current) drug therapy: Secondary | ICD-10-CM | POA: Diagnosis not present

## 2023-03-12 DIAGNOSIS — I10 Essential (primary) hypertension: Secondary | ICD-10-CM

## 2023-03-12 DIAGNOSIS — Z23 Encounter for immunization: Secondary | ICD-10-CM

## 2023-03-12 NOTE — Patient Instructions (Signed)
Td (Tetanus, Diphtheria) Vaccine: What You Need to Know Many vaccine information statements are available in Spanish and other languages. See PromoAge.com.br. 1. Why get vaccinated? Td vaccine can prevent tetanus and diphtheria. Tetanus enters the body through cuts or wounds. Diphtheria spreads from person to person. TETANUS (T) causes painful stiffening of the muscles. Tetanus can lead to serious health problems, including being unable to open the mouth, having trouble swallowing and breathing, or death. DIPHTHERIA (D) can lead to difficulty breathing, heart failure, paralysis, or death. 2. Td vaccine Td is only for children 7 years and older, adolescents, and adults.  Td is usually given as a booster dose every 10 years, or after 5 years in the case of a severe or dirty wound or burn. Another vaccine, called "Tdap," may be used instead of Td. Tdap protects against pertussis, also known as "whooping cough," in addition to tetanus and diphtheria. Td may be given at the same time as other vaccines. 3. Talk with your health care provider Tell your vaccination provider if the person getting the vaccine: Has had an allergic reaction after a previous dose of any vaccine that protects against tetanus or diphtheria, or has any severe, life-threatening allergies Has ever had Guillain-Barr Syndrome (also called "GBS") Has had severe pain or swelling after a previous dose of any vaccine that protects against tetanus or diphtheria In some cases, your health care provider may decide to postpone Td vaccination until a future visit. People with minor illnesses, such as a cold, may be vaccinated. People who are moderately or severely ill should usually wait until they recover before getting Td vaccine.  Your health care provider can give you more information. 4. Risks of a vaccine reaction Pain, redness, or swelling where the shot was given, mild fever, headache, feeling tired, and nausea, vomiting,  diarrhea, or stomachache sometimes happen after Td vaccination. People sometimes faint after medical procedures, including vaccination. Tell your provider if you feel dizzy or have vision changes or ringing in the ears.  As with any medicine, there is a very remote chance of a vaccine causing a severe allergic reaction, other serious injury, or death. 5. What if there is a serious problem? An allergic reaction could occur after the vaccinated person leaves the clinic. If you see signs of a severe allergic reaction (hives, swelling of the face and throat, difficulty breathing, a fast heartbeat, dizziness, or weakness), call 9-1-1 and get the person to the nearest hospital.  For other signs that concern you, call your health care provider.  Adverse reactions should be reported to the Vaccine Adverse Event Reporting System (VAERS). Your health care provider will usually file this report, or you can do it yourself. Visit the VAERS website at www.vaers.LAgents.no or call 581 417 1246. VAERS is only for reporting reactions, and VAERS staff members do not give medical advice. 6. The National Vaccine Injury Compensation Program The [employer] Vaccine Injury Compensation Program (VICP) is a federal program that was created to compensate people who may have been injured by certain vaccines. Claims regarding alleged injury or death due to vaccination have a time limit for filing, which may be as short as two years. Visit the VICP website at SpiritualWord.at or call (587)575-7293 to learn about the program and about filing a claim. 7. How can I learn more? Ask your health care provider. Call your local or state health department. Visit the website of the Food and Drug Administration (FDA) for vaccine package inserts and additional information at FinderList.no. Contact  the Centers for Disease Control and Prevention (CDC): Call 541-030-0300 (1-800-CDC-INFO) or Visit  CDC's website at PicCapture.uy. Source: CDC Vaccine Information Statement Td (Tetanus, Diphtheria) Vaccine (12/11/2019) This same material is available at FootballExhibition.com.br for no charge. This information is not intended to replace advice given to you by your health care provider. Make sure you discuss any questions you have with your health care provider. Document Revised: 08/08/2022 Document Reviewed: 06/08/2022 Elsevier Patient Education  2024 [employer].

## 2023-03-13 ENCOUNTER — Other Ambulatory Visit (INDEPENDENT_AMBULATORY_CARE_PROVIDER_SITE_OTHER): Payer: MEDICAID

## 2023-03-17 MED ORDER — OMEPRAZOLE 40 MG PO CPDR: 40.0000 mg | DELAYED_RELEASE_CAPSULE | Freq: Every day | ORAL | 1 refills | Status: DC

## 2023-03-17 NOTE — Progress Notes (Signed)
[facility]  [name], is a 62 y.o. male  AVW:[id number]  WGN:[id number]  DOB - June 27, 1960  Chief Complaint  Patient presents with   Hypertension   Medication Refill    Pt is requesting a refill omeprazole       Subjective:   [name] is a 63 y.o. male here today for a follow up visit for the management of hypertension.  Patient has No headache, No chest pain, No abdominal pain - No Nausea, No new weakness tingling or numbness, No Cough - shortness of breath.  He is requesting medication for GERD to be refill which previously was done.  No problems updated.  Allergies  Allergen Reactions   Hydrocodone-Acetaminophen Palpitations   Shrimp [Shellfish Allergy] Other (See Comments)    Cholesterol level becomes very high. Patient reports sweating and itching.    Past Medical History:  Diagnosis Date   Alcohol abuse    Allergy    Anxiety    Arthritis    Diverticulosis of colon (without mention of hemorrhage)    GERD (gastroesophageal reflux disease)    Gout    History of kidney stones    Hyperlipemia    Hypertension    dr Kathlene November  norins   Kidney stones    Nephrolithiasis    Pancreatitis    Hx of    Ureteral stent displacement (HCC) 1990    Current Outpatient Medications on File Prior to Visit  Medication Sig Dispense Refill   amLODipine (NORVASC) 10 MG tablet Take 1 tablet (10 mg total) by mouth daily. 90 tablet 1   amoxicillin-clavulanate (AUGMENTIN) 875-125 MG tablet Take 1 tablet by mouth every 12 (twelve) hours. 14 tablet 0   atorvastatin (LIPITOR) 40 MG tablet Take 1 tablet (40 mg total) by mouth daily. 90 tablet 3   carvedilol (COREG) 25 MG tablet Take 1 tablet (25 mg total) by mouth 2 (two) times daily with a meal. 180 tablet 1   cyclobenzaprine (FLEXERIL) 10 MG tablet Take 1 tablet by mouth three times daily as needed for muscle spasm 60 tablet 1   losartan (COZAAR) 25 MG tablet Take 1 tablet (25 mg total) by mouth daily. 90 tablet 3    Melatonin 10 MG CAPS Take 10 mg by mouth at bedtime as needed. 90 capsule 1   Multiple Vitamins-Minerals (MULTIVITAMIN WITH MINERALS) tablet Take 1 tablet by mouth daily.     omeprazole (PRILOSEC) 40 MG capsule Take 40 mg by mouth daily.     oxyCODONE-acetaminophen (PERCOCET) 10-325 MG tablet Take 1 tablet by mouth 3 (three) times daily as needed.     potassium chloride SA (KLOR-CON M) 20 MEQ tablet Take 1 tablet (20 mEq total) by mouth daily. 14 tablet 0   Rimegepant Sulfate (NURTEC) 75 MG TBDP Take 75 mg by mouth daily as needed. 8 tablet 6   rizatriptan (MAXALT-MLT) 10 MG disintegrating tablet Take 1 tablet (10 mg total) by mouth as needed for migraine. May repeat in 2 hours if needed 9 tablet 11   Tetrahydrozoline HCl (VISINE OP) Place 1 drop into both eyes daily as needed (redness).     triamcinolone cream (KENALOG) 0.1 % Apply 1 application topically 2 (two) times daily. (Patient taking differently: Apply 1 application  topically as needed.) 60 g 0   [DISCONTINUED] diphenhydrAMINE (BENADRYL) 25 MG tablet Take 1 tablet (25 mg total) by mouth every 6 (six) hours as needed. 30 tablet 0   [DISCONTINUED] metoCLOPramide (REGLAN) 10 MG tablet Take 1 tablet (  10 mg total) by mouth every 6 (six) hours as needed for nausea (nausea/headache). Take this tablet with 25 mg of Benadryl and two extra strength Tylenol for headache. 12 tablet 0   No current facility-administered medications on file prior to visit.    Objective:   Vitals:   03/12/23 1058 03/12/23 1209  BP: 131/87 134/87  Pulse: 89   Resp: 16   SpO2: 100%   Weight: 259 lb 12.8 oz (117.8 kg)     Comprehensive ROS Pertinent positive and negative noted in HPI   Exam General appearance : Awake, alert, not in any distress. Speech Clear. Not toxic looking HEENT: Atraumatic and Normocephalic, pupils equally reactive to light and accomodation Neck: Supple, no JVD. No cervical lymphadenopathy.  Chest: Good air entry bilaterally, no added  sounds  CVS: S1 S2 regular, no murmurs.  Abdomen: Bowel sounds present, Non tender and not distended with no gaurding, rigidity or rebound. Extremities: B/L Lower Ext shows no edema, both legs are warm to touch Neurology: Awake alert, and oriented X 3, CN II-XII intact, Non focal Skin: No Rash  Data Review Lab Results  Component Value Date   HGBA1C 5.1 01/10/2022   HGBA1C 5.3 09/08/2019   HGBA1C 5.4 03/02/2014    Assessment & Plan   [name] was seen today for hypertension and medication refill.  Diagnoses and all orders for this visit:  Influenza vaccination declined  Encounter for immunization -     Tdap vaccine greater than or equal to 7yo IM  Essential hypertension  Blood pressure well-controlled on losartan and carvedilol  Patient have been counseled extensively about nutrition and exercise. Other issues discussed during this visit include: low cholesterol diet, weight control and daily exercise, foot care, annual eye examinations at Ophthalmology, importance of adherence with medications and regular follow-up. We also discussed long term complications of uncontrolled diabetes and hypertension.   Follow-up in 6 months  The patient was given clear instructions to go to ER or return to medical center if symptoms don't improve, worsen or new problems develop. The patient verbalized understanding. The patient was told to call to get lab results if they haven't heard anything in the next week.   This note has been created with Education officer, environmental. Any transcriptional errors are unintentional.   Grayce Sessions, NP 03/17/2023, 9:42 PM

## 2023-03-28 ENCOUNTER — Ambulatory Visit (INDEPENDENT_AMBULATORY_CARE_PROVIDER_SITE_OTHER): Payer: Self-pay

## 2023-03-28 ENCOUNTER — Telehealth (INDEPENDENT_AMBULATORY_CARE_PROVIDER_SITE_OTHER): Payer: Self-pay | Admitting: Primary Care

## 2023-03-28 ENCOUNTER — Ambulatory Visit
Admission: EM | Admit: 2023-03-28 | Discharge: 2023-03-28 | Disposition: A | Discharge status: Home / Self Care | Payer: MEDICAID | Attending: Physician Assistant | Admitting: Physician Assistant

## 2023-03-28 ENCOUNTER — Ambulatory Visit: Payer: MEDICAID

## 2023-03-28 ENCOUNTER — Ambulatory Visit (HOSPITAL_BASED_OUTPATIENT_CLINIC_OR_DEPARTMENT_OTHER): Payer: MEDICAID | Admitting: Student

## 2023-03-28 DIAGNOSIS — S9032XA Contusion of left foot, initial encounter: Secondary | ICD-10-CM

## 2023-03-28 DIAGNOSIS — M79672 Pain in left foot: Secondary | ICD-10-CM

## 2023-03-28 MED ORDER — OXYCODONE-ACETAMINOPHEN 5-325 MG PO TABS: 0.5000 | ORAL_TABLET | Freq: Every evening | ORAL | 0 refills | Status: AC | PRN

## 2023-03-28 NOTE — Telephone Encounter (Signed)
Pt called in requesting to speak to Gwinda Passe, NP.   Patient called earlier and spoke to Nurse Triage, pt was triaged and had an ortho appointment scheduled for today. Patient cancelled appointment.   Pt is requesting callback today, he would like to discuss having a older prescription called in. Pt unaware of name of prescription.   Best callback number: [phone]

## 2023-03-28 NOTE — Discharge Instructions (Signed)
I did not see any broken bone on your x-ray but we will contact you if the radiologist sees something that changes our treatment plan.  As we discussed, we typically do not prescribe oxycodone in urgent care.  We will not be able to give you any refills of this medication.  I have given you 3 tablets and I recommend you limit use is much as possible take them primarily at night.  Do not drive or drink alcohol while taking this medication.  Please contact the podiatrist first thing tomorrow to see if they can get you in for further evaluation and management given the severity of your pain.  If anything worsens overnight and you have increasing pain, numbness, tingling, discoloration, cold sensation go to the emergency room.

## 2023-03-28 NOTE — ED Provider Notes (Signed)
[facility]    CSN: [id number] Arrival date & time: 03/28/23  1709      History   Chief Complaint No chief complaint on file.   HPI [name] is a 62 y.o. male.   Patient presents today with a several day history of significant left foot pain.  Reports that yesterday he was walking when he took a misstep off of a curb and hit the front of his foot.  He did not initially have pain and was able to go about the rest of his day but then overnight the pain worsened and he is now unable to bear weight.  He has tried elevation as well as ibuprofen without improvement of symptoms.  He reports pain is rated 10 on a 0-10 pain scale, present at rest but worsened with ambulation, no alleviating factors notified.  He did have some numbness in the foot.  Denies any paresthesias.  He denies previous injury or surgery involving his foot.  He is having difficulty with daily duties as a result of symptoms.    Past Medical History:  Diagnosis Date   Alcohol abuse    Allergy    Anxiety    Arthritis    Diverticulosis of colon (without mention of hemorrhage)    GERD (gastroesophageal reflux disease)    Gout    History of kidney stones    Hyperlipemia    Hypertension    dr Kathlene November  norins   Kidney stones    Nephrolithiasis    Pancreatitis    Hx of    Ureteral stent displacement [facility]) 1990    Patient Active Problem List   Diagnosis Date Noted   Chest pain of uncertain etiology 07/24/2019   Alcoholic pancreatitis 05/20/2018   Normal anion gap metabolic acidosis 05/20/2018   Radiculopathy 03/27/2018   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/02/2017   Renal stone 10/08/2016   Non-compliant behavior 12/02/2013   Routine health maintenance 01/27/2012   Hyperlipidemia 01/27/2012   Knee pain, left 11/12/2011   BPH (benign prostatic hyperplasia) 11/12/2011   GERD 08/10/2008   Essential hypertension 04/14/2008   Alcohol abuse 10/26/2007   TRIGGER FINGER, LEFT MIDDLE 05/12/2007    NEPHROLITHIASIS, HX OF 05/12/2007    Past Surgical History:  Procedure Laterality Date   A2 pulley flexor sheath cyst 4th finger excisional biopsy     '03   ANTERIOR CRUCIATE LIGAMENT REPAIR     R knee   ESOPHAGOGASTRODUODENOSCOPY N/A 07/09/2012   Procedure: ESOPHAGOGASTRODUODENOSCOPY (EGD);  Surgeon: Louis Meckel, MD;  Location: [facility];  Service: Endoscopy;  Laterality: N/A;   KNEE SURGERY  2008   LEFT HEART CATH AND CORONARY ANGIOGRAPHY N/A 08/10/2019   Procedure: LEFT HEART CATH AND CORONARY ANGIOGRAPHY;  Surgeon: Runell Gess, MD;  Location: [facility];  Service: Cardiovascular;  Laterality: N/A;   SHOULDER ARTHROSCOPY WITH SUBACROMIAL DECOMPRESSION AND OPEN ROTATOR C Right 12/26/2012   Procedure: RIGHT SHOULDER ARTHROSCOPY WITH SUBACROMIAL DECOMPRESSION MINI OPEN ROTATOR CUFF REPAIR, OPEN BICEP TENODESIS OPEN DISTAL CLAVICLE RESECTION  ;  Surgeon: Verlee Rossetti, MD;  Location: [facility] OR;  Service: Orthopedics;  Laterality: Right;   TRANSFORAMINAL LUMBAR INTERBODY FUSION (TLIF) WITH PEDICLE SCREW FIXATION 1 LEVEL Right 03/27/2018   Procedure: RIGHT-SIDED LUMBAR 4-5 TRANSFORAMINAL LUMBAR INTERBODY FUSION WITH INSTRUMENTATION AND ALLOGRAFT;  Surgeon: Estill Bamberg, MD;  Location: [facility] OR;  Service: Orthopedics;  Laterality: Right;   URETEROSCOPY     and stone retreval  Home Medications    Prior to Admission medications   Medication Sig Start Date End Date Taking? Authorizing Provider  oxyCODONE-acetaminophen (PERCOCET/ROXICET) 5-325 MG tablet Take 0.5-1 tablets by mouth at bedtime as needed for up to 3 days for severe pain (pain score 7-10). 03/28/23 03/31/23 Yes Masayo Fera K, PA-C  amLODipine (NORVASC) 10 MG tablet Take 1 tablet (10 mg total) by mouth daily. 10/18/22   Grayce Sessions, NP  atorvastatin (LIPITOR) 40 MG tablet Take 1 tablet (40 mg total) by mouth daily. 08/22/22   Ronney Asters, NP  carvedilol (COREG) 25 MG tablet Take 1 tablet (25 mg total)  by mouth 2 (two) times daily with a meal. 10/18/22   Grayce Sessions, NP  cyclobenzaprine (FLEXERIL) 10 MG tablet Take 1 tablet by mouth three times daily as needed for muscle spasm 08/09/21   Grayce Sessions, NP  losartan (COZAAR) 25 MG tablet Take 1 tablet (25 mg total) by mouth daily. 08/23/22   Ronney Asters, NP  Melatonin 10 MG CAPS Take 10 mg by mouth at bedtime as needed. 01/10/22   Grayce Sessions, NP  Multiple Vitamins-Minerals (MULTIVITAMIN WITH MINERALS) tablet Take 1 tablet by mouth daily.    [provider]  omeprazole (PRILOSEC) 40 MG capsule Take 1 capsule (40 mg total) by mouth daily. 03/17/23   Grayce Sessions, NP  potassium chloride SA (KLOR-CON M) 20 MEQ tablet Take 1 tablet (20 mEq total) by mouth daily. 09/21/21   Hilarie Fredrickson, MD  Rimegepant Sulfate (NURTEC) 75 MG TBDP Take 75 mg by mouth daily as needed. 09/19/20   Penumalli, Glenford Bayley, MD  rizatriptan (MAXALT-MLT) 10 MG disintegrating tablet Take 1 tablet (10 mg total) by mouth as needed for migraine. May repeat in 2 hours if needed 09/19/20   Penumalli, Glenford Bayley, MD  Tetrahydrozoline HCl (VISINE OP) Place 1 drop into both eyes daily as needed (redness).    [provider]  triamcinolone cream (KENALOG) 0.1 % Apply 1 application topically 2 (two) times daily. Patient taking differently: Apply 1 application  topically as needed. 09/08/19   Bing Neighbors, NP  diphenhydrAMINE (BENADRYL) 25 MG tablet Take 1 tablet (25 mg total) by mouth every 6 (six) hours as needed. 04/04/20 06/17/20  Arby Barrette, MD  metoCLOPramide (REGLAN) 10 MG tablet Take 1 tablet (10 mg total) by mouth every 6 (six) hours as needed for nausea (nausea/headache). Take this tablet with 25 mg of Benadryl and two extra strength Tylenol for headache. 04/27/20 06/17/20  Grayce Sessions, NP    Family History Family History  Problem Relation Age of Onset   Diabetes Mother        x2 brothers   Hypertension Father        X4  siblings   Heart disease Father    Colon cancer Neg Hx    Esophageal cancer Neg Hx     Social History Social History   Tobacco Use   Smoking status: Former    Current packs/day: 0.00    Types: Cigarettes    Quit date: 07/10/1998    Years since quitting: 24.7   Smokeless tobacco: Never  Vaping Use   Vaping status: Never Used  Substance Use Topics   Alcohol use: Yes    Alcohol/week: 6.0 standard drinks of alcohol    Types: 6 Cans of beer per week    Comment: a couple of beers a day   Drug use: No  Allergies   Hydrocodone-acetaminophen and Shrimp [shellfish allergy]   Review of Systems Review of Systems  Constitutional:  Positive for activity change. Negative for appetite change, fatigue and fever.  Gastrointestinal:  Negative for abdominal pain, diarrhea, nausea and vomiting.  Musculoskeletal:  Positive for arthralgias, gait problem and joint swelling. Negative for back pain and myalgias.  Skin:  Negative for color change and wound.  Neurological:  Negative for weakness and numbness.     Physical Exam Triage Vital Signs ED Triage Vitals  Encounter Vitals Group     BP 03/28/23 1801 138/76     Systolic BP Percentile --      Diastolic BP Percentile --      Pulse Rate 03/28/23 1801 94     Resp 03/28/23 1801 18     Temp 03/28/23 1759 98.2 F (36.8 C)     Temp Source 03/28/23 1759 Oral     SpO2 03/28/23 1801 98 %     Weight 03/28/23 1803 258 lb (117 kg)     Height 03/28/23 1803 6' (1.829 m)     Head Circumference --      Peak Flow --      Pain Score 03/28/23 1802 10     Pain Loc --      Pain Education --      Exclude from Growth Chart --    No data found.  Updated Vital Signs BP 138/76 (BP Location: Left Arm)   Pulse 94   Temp 98.2 F (36.8 C) (Oral)   Resp 18   Ht 6' (1.829 m)   Wt 258 lb (117 kg)   SpO2 98%   BMI 34.99 kg/m   Visual Acuity Right Eye Distance:   Left Eye Distance:   Bilateral Distance:    Right Eye Near:   Left Eye Near:     Bilateral Near:     Physical Exam Vitals reviewed.  Constitutional:      General: He is awake.     Appearance: Normal appearance. He is well-developed. He is not ill-appearing.     Comments: Very pleasant male appears stated age sitting in wheelchair with his left foot propped up on the exam table in no acute distress  HENT:     Head: Normocephalic and atraumatic.     Mouth/Throat:     Pharynx: Uvula midline. No oropharyngeal exudate or posterior oropharyngeal erythema.  Cardiovascular:     Rate and Rhythm: Normal rate and regular rhythm.     Pulses:          Posterior tibial pulses are 2+ on the left side.     Heart sounds: Normal heart sounds, S1 normal and S2 normal. No murmur heard.    Comments: Capillary refill slightly delayed in left toes at less than 3 seconds but normal PT pulse. Pulmonary:     Effort: Pulmonary effort is normal.     Breath sounds: Normal breath sounds. No stridor. No wheezing, rhonchi or rales.     Comments: Clear to auscultation bilaterally Musculoskeletal:     Left foot: Normal range of motion and normal capillary refill. Tenderness and bony tenderness present. No swelling.     Comments: Left foot: Significant tenderness palpation with associated swelling over dorsal left foot along metatarsals.  Normal active range of motion of phalanges.  Foot is neurovascularly intact.  Neurological:     Mental Status: He is alert.  Psychiatric:        Behavior: Behavior is cooperative.  UC Treatments / Results  Labs (all labs ordered are listed, but only abnormal results are displayed) Labs Reviewed - No data to display  EKG   Radiology No results found.  Procedures Procedures (including critical care time)  Medications Ordered in UC Medications - No data to display  Initial Impression / Assessment and Plan / UC Course  I have reviewed the triage vital signs and the nursing notes.  Pertinent labs & imaging results that were available during  my care of the patient were reviewed by me and considered in my medical decision making (see chart for details).     Patient is well-appearing, afebrile, nontoxic, nontachycardic.  Foot is neurovascularly intact with no indication for emergent evaluation or imaging.  X-ray was obtained and based on my primary read there was no fracture or dislocation but he did have some degenerative changes.  At the time of discharge we were waiting for radiologist over read and we will contact patient if this differs and changes our treatment plan.  He was encouraged to use RICE protocol as well as over-the-counter analgesics for symptom relief.  He was placed in a postop shoe for comfort and support.  Discussed that typically we prescribe high-dose NSAIDs to help with pain patient reports he is already been consistently taking 600- 800 mg of ibuprofen without improvement of symptoms.  He reports that he has previously tried tramadol on multiple occasions but this is ineffective and causes him side effects.  He is unable to take hydrocodone.  Discussed that we typically do not prescribe oxycodone in clinic but after long conversation with patient regarding our limited options I did agree to provide 3 tablets to help him get through the night.  Review of [place] controlled substance database shows no inappropriate refills.  Discussed that we would not be able to provide him any refills and is very important that he follow-up with soon as possible with specialist.  He has already seen a podiatrist in the past and so was encouraged to call to schedule an appointment with them soon as possible.  Discussed that if any point anything worsens and he has increasing pain, swelling, numbness or tingling, cold sensation, pain out of proportion to activity he would need to go to the emergency room to which he expressed understanding.  Strict return precautions given.  All questions answered to patient satisfaction.  He declined  work excuse note.  Final Clinical Impressions(s) / UC Diagnoses   Final diagnoses:  Contusion of left foot, initial encounter  Left foot pain     Discharge Instructions      I did not see any broken bone on your x-ray but we will contact you if the radiologist sees something that changes our treatment plan.  As we discussed, we typically do not prescribe oxycodone in urgent care.  We will not be able to give you any refills of this medication.  I have given you 3 tablets and I recommend you limit use is much as possible take them primarily at night.  Do not drive or drink alcohol while taking this medication.  Please contact the podiatrist first thing tomorrow to see if they can get you in for further evaluation and management given the severity of your pain.  If anything worsens overnight and you have increasing pain, numbness, tingling, discoloration, cold sensation go to the emergency room.     ED Prescriptions     Medication Sig Dispense Auth. Provider   oxyCODONE-acetaminophen (PERCOCET/ROXICET)  5-325 MG tablet Take 0.5-1 tablets by mouth at bedtime as needed for up to 3 days for severe pain (pain score 7-10). 3 tablet Alona Danford K, PA-C      I have reviewed the PDMP during this encounter.   Jeani Hawking, PA-C 03/28/23 [id number]

## 2023-03-28 NOTE — Telephone Encounter (Signed)
  Chief Complaint: ankle injury  Symptoms: L ankle injury d/t twisted foot, swelling and pain 9/10 Frequency: yesterday  Pertinent Negatives: NA Disposition: [] ED /[] Urgent Care (no appt availability in office) / [x] Appointment(In office/virtual)/ []  [facility]/ [] Home Care/ [] Refused Recommended Disposition /[]  Mobile Bus/ []  Follow-up with PCP Additional Notes: pt states twisted ankle yesterday and unable to bear full weight on L foot, has used iced and Tylenol but still swollen and painful. No appts with PCP until 04/01/23, scheduled pt at Ortho clinic today at 1400. Pt provided address and phone # in case needing to cancel/rs. Care advice given and pt verbalized understanding.   Reason for Disposition  [1] Limp when walking AND [2] due to a twisted ankle or foot  Answer Assessment - Initial Assessment Questions 1. MECHANISM: "How did the injury happen?" (e.g., twisting injury, direct blow)      Twisted ankle  2. ONSET: "When did the injury happen?" (Minutes or hours ago)      Yesterday  3. LOCATION: "Where is the injury located?"      L foot and ankle  5. WEIGHT-BEARING: "Can you put weight on that foot?" "Can you walk (four steps or more)?"       Unable to apply full weight  6. SIZE: For cuts, bruises, or swelling, ask: "How large is it?" (e.g., inches or centimeters;  entire joint)      Swelling top of foot  7. PAIN: "Is there pain?" If Yes, ask: "How bad is the pain?"    (e.g., Scale 1-10; or mild, moderate, severe)   - NONE (0): no pain.   - MILD (1-3): doesn't interfere with normal activities.    - MODERATE (4-7): interferes with normal activities (e.g., work or school) or awakens from sleep, limping.    - SEVERE (8-10): excruciating pain, unable to do any normal activities, unable to walk.      9  Protocols used: Ankle and Foot Injury-A-AH

## 2023-03-28 NOTE — ED Triage Notes (Addendum)
Patients presents with left foot pain, stepped off of a curve, did not feel like he injured it, throbbing and swelling started yesterday evening. Treated with ice, heat, Ibuprofen, Tylenol and elevation foot. Pt states heat helped some.

## 2023-03-29 NOTE — Telephone Encounter (Signed)
Returned pt call. Pt states he went to the urgent care for foot and ankle pain. Pt states he was given 3 oxycodones. Pt states  Pt is requesting something for the pain. Made pt aware that provider doesn't prescribe narcotics. Pt then asked how is that suppose to help him. I made pt aware that provider recommends Tylenol or ibuprofen. Pt then stated how is that suppose to help him. I made pt aware that I advise that he reach back out to [facility] to schedule an appt and to have them place him on the cancellation list so that whay if someone cancels he can be seen sooner. Pt stated yep and hung up

## 2023-04-09 ENCOUNTER — Other Ambulatory Visit (INDEPENDENT_AMBULATORY_CARE_PROVIDER_SITE_OTHER): Payer: Self-pay | Admitting: Primary Care

## 2023-04-09 DIAGNOSIS — K219 Gastro-esophageal reflux disease without esophagitis: Secondary | ICD-10-CM

## 2023-04-09 MED ORDER — OMEPRAZOLE 40 MG PO CPDR
40.0000 mg | DELAYED_RELEASE_CAPSULE | Freq: Every day | ORAL | 1 refills | Status: DC
Start: 2023-04-09 — End: 2023-10-03

## 2023-04-09 NOTE — Telephone Encounter (Signed)
Requested Prescriptions  Pending Prescriptions Disp Refills   omeprazole (PRILOSEC) 40 MG capsule 90 capsule 1    Sig: Take 1 capsule (40 mg total) by mouth daily.     Gastroenterology: Proton Pump Inhibitors Passed - 04/09/2023  3:14 PM      Passed - Valid encounter within last 12 months    Recent Outpatient Visits           4 weeks ago Essential hypertension   [place] [facility] Grayce Sessions, NP   5 months ago Toe infection   [place] [facility] Grayce Sessions, NP   9 months ago Essential hypertension   [place] [facility] Grayce Sessions, NP   1 year ago Screening for diabetes mellitus    [facility] Grayce Sessions, NP   1 year ago Hospital discharge follow-up    [facility] Grayce Sessions, NP

## 2023-04-09 NOTE — Telephone Encounter (Signed)
Medication Refill -  Most Recent Primary Care Visit:  Provider: Grayce Sessions  Department: [facility]  Visit Type: OFFICE VISIT  Date: 03/12/2023  Medication: omeprazole (PRILOSEC) 40 MG capsule   Has the patient contacted their pharmacy? No  Is this the correct pharmacy for this prescription? Yes If no, delete pharmacy and type the correct one.  This is the patient's preferred pharmacy: [facility] [address], [place] - [address] [place] [address] Phone: [phone] Fax: [phone]  Has the prescription been filled recently? Yes  Is the patient out of the medication? Yes  Has the patient been seen for an appointment in the last year OR does the patient have an upcoming appointment? Yes  Can we respond through MyChart? No  Agent: Please be advised that Rx refills may take up to 3 business days. We ask that you follow-up with your pharmacy.  Patient took his last pill over the weekend and will be going out of town on Thursday and need it before he leaves

## 2023-04-16 ENCOUNTER — Ambulatory Visit (INDEPENDENT_AMBULATORY_CARE_PROVIDER_SITE_OTHER): Payer: MEDICAID | Admitting: Podiatry

## 2023-04-16 DIAGNOSIS — Z91199 Patient's noncompliance with other medical treatment and regimen due to unspecified reason: Secondary | ICD-10-CM

## 2023-04-18 NOTE — Progress Notes (Signed)
Patient was no-show for appointment today 

## 2023-05-29 ENCOUNTER — Encounter (HOSPITAL_COMMUNITY): Payer: Self-pay | Admitting: *Deleted

## 2023-05-29 ENCOUNTER — Other Ambulatory Visit: Payer: Self-pay

## 2023-05-29 ENCOUNTER — Emergency Department (HOSPITAL_COMMUNITY)
Admission: EM | Admit: 2023-05-29 | Discharge: 2023-05-30 | Disposition: A | Discharge status: Home / Self Care | Payer: MEDICAID | Attending: Emergency Medicine | Admitting: Emergency Medicine

## 2023-05-29 ENCOUNTER — Emergency Department (HOSPITAL_COMMUNITY): Payer: MEDICAID

## 2023-05-29 DIAGNOSIS — X500XXA Overexertion from strenuous movement or load, initial encounter: Secondary | ICD-10-CM | POA: Insufficient documentation

## 2023-05-29 DIAGNOSIS — M25511 Pain in right shoulder: Secondary | ICD-10-CM | POA: Diagnosis not present

## 2023-05-29 DIAGNOSIS — S4991XA Unspecified injury of right shoulder and upper arm, initial encounter: Secondary | ICD-10-CM | POA: Diagnosis present

## 2023-05-29 MED ORDER — FENTANYL CITRATE PF 50 MCG/ML IJ SOSY
75.0000 ug | PREFILLED_SYRINGE | Freq: Once | INTRAMUSCULAR | Status: AC
  Administered 2023-05-29: 75 ug via INTRAMUSCULAR
  Filled 2023-05-29: qty 2

## 2023-05-29 MED ORDER — OXYCODONE-ACETAMINOPHEN 5-325 MG PO TABS
1.0000 | ORAL_TABLET | Freq: Once | ORAL | Status: AC
  Administered 2023-05-29: 1 via ORAL
  Filled 2023-05-29: qty 1

## 2023-05-29 NOTE — Discharge Instructions (Signed)
Your workup today shows what appears to be a minor subluxation of the right shoulder joint.  Please use the sling for support and follow-up with orthopedic surgery.  I recommend taking over-the-counter medication such as Tylenol or Advil for pain control at home.  You may take the sling off for hygiene/bathing.  If you develop any life-threatening symptoms please return to the emergency department.  Do not exceed 4000 mg of acetaminophen from all sources in a 24 hour period

## 2023-05-29 NOTE — ED Notes (Signed)
The pt reports that he takes percocet without any problem

## 2023-05-29 NOTE — ED Provider Notes (Signed)
[place] EMERGENCY DEPARTMENT AT [facility] Provider Note   CSN: [id number] Arrival date & time: 05/29/23  1735     History  Chief Complaint  Patient presents with   Shoulder Injury    [name] is a 63 y.o. male.  Patient with past medical history significant for previous rotator cuff repair, alcohol abuse, GERD, transforaminal lumbar interbody fusion presents the emergency department complaining of right sided shoulder pain.  Patient states that he lifted a heavy trash bag yesterday when trying to sling it with his right arm felt a sudden pop in the right shoulder and has had severe pain since that time.  He has difficulty moving the right shoulder due to pain.  He denies falling or other injuries.   Shoulder Injury       Home Medications Prior to Admission medications   Medication Sig Start Date End Date Taking? Authorizing Provider  amLODipine (NORVASC) 10 MG tablet Take 1 tablet (10 mg total) by mouth daily. 10/18/22   Grayce Sessions, NP  atorvastatin (LIPITOR) 40 MG tablet Take 1 tablet (40 mg total) by mouth daily. 08/22/22   Ronney Asters, NP  carvedilol (COREG) 25 MG tablet Take 1 tablet (25 mg total) by mouth 2 (two) times daily with a meal. 10/18/22   Grayce Sessions, NP  cyclobenzaprine (FLEXERIL) 10 MG tablet Take 1 tablet by mouth three times daily as needed for muscle spasm 08/09/21   Grayce Sessions, NP  losartan (COZAAR) 25 MG tablet Take 1 tablet (25 mg total) by mouth daily. 08/23/22   Ronney Asters, NP  Melatonin 10 MG CAPS Take 10 mg by mouth at bedtime as needed. 01/10/22   Grayce Sessions, NP  Multiple Vitamins-Minerals (MULTIVITAMIN WITH MINERALS) tablet Take 1 tablet by mouth daily.    [provider]  omeprazole (PRILOSEC) 40 MG capsule Take 1 capsule (40 mg total) by mouth daily. 04/09/23   Grayce Sessions, NP  potassium chloride SA (KLOR-CON M) 20 MEQ tablet Take 1 tablet (20 mEq total) by mouth daily. 09/21/21    Hilarie Fredrickson, MD  Rimegepant Sulfate (NURTEC) 75 MG TBDP Take 75 mg by mouth daily as needed. 09/19/20   Penumalli, Glenford Bayley, MD  rizatriptan (MAXALT-MLT) 10 MG disintegrating tablet Take 1 tablet (10 mg total) by mouth as needed for migraine. May repeat in 2 hours if needed 09/19/20   Penumalli, Glenford Bayley, MD  Tetrahydrozoline HCl (VISINE OP) Place 1 drop into both eyes daily as needed (redness).    [provider]  triamcinolone cream (KENALOG) 0.1 % Apply 1 application topically 2 (two) times daily. Patient taking differently: Apply 1 application  topically as needed. 09/08/19   Bing Neighbors, NP  diphenhydrAMINE (BENADRYL) 25 MG tablet Take 1 tablet (25 mg total) by mouth every 6 (six) hours as needed. 04/04/20 06/17/20  Arby Barrette, MD  metoCLOPramide (REGLAN) 10 MG tablet Take 1 tablet (10 mg total) by mouth every 6 (six) hours as needed for nausea (nausea/headache). Take this tablet with 25 mg of Benadryl and two extra strength Tylenol for headache. 04/27/20 06/17/20  Grayce Sessions, NP      Allergies    Hydrocodone-acetaminophen and Shrimp [shellfish allergy]    Review of Systems   Review of Systems  Physical Exam Updated Vital Signs BP (!) 139/115   Pulse 76   Temp 99.7 F (37.6 C)   Resp 20   Ht 6' (1.829 m)  Wt 117 kg   SpO2 99%   BMI 34.98 kg/m  Physical Exam  ED Results / Procedures / Treatments   Labs (all labs ordered are listed, but only abnormal results are displayed) Labs Reviewed - No data to display  EKG None  Radiology DG Shoulder Right Result Date: 05/29/2023 CLINICAL DATA:  [id number]. Right shoulder injury with history of rotator cuff surgery. EXAM: RIGHT SHOULDER - 2+ VIEW COMPARISON:  None Available. FINDINGS: The bones are mildly demineralized. There is no evidence of fractures. No anterior or posterior dislocation. There is mild inferior displacement versus a partial inferior subluxation of the humeral head, which can be seen in the  setting of a joint effusion or ligamentous laxity. There is inferior spurring and narrowing of the glenohumeral joint, evidence of remote distal right clavicle resection. There are loosely clustered small calcifications in the acromiohumeral space which could indicate calcific rotator cuff tendinitis or tendinopathy or could be dystrophic postsurgical calcifications. Visualized right lung fields are grossly clear. No displaced fractures of the visualized ribs. IMPRESSION: 1. No evidence of fractures.  Mild osteopenia. 2. Mild inferior displacement versus a partial inferior subluxation of the humeral head, which can be seen in the setting of a joint effusion or glenohumeral ligamentous laxity. 3. Degenerative changes of the glenohumeral joint. 4. Loosely clustered small calcifications in the acromiohumeral space which could indicate calcific rotator cuff tendinitis or tendinopathy or could be dystrophic postsurgical calcifications. Electronically Signed   By: Almira Bar M.D.   On: 05/29/2023 20:07    Procedures .Ortho Injury Treatment  Date/Time: 05/29/2023 11:48 PM  Performed by: Darrick Grinder, PA-C Authorized by: Darrick Grinder, PA-C   Consent:    Consent obtained:  Verbal   Consent given by:  PatientInjury location: shoulder Location details: right shoulder Injury type: soft tissue Pre-procedure neurovascular assessment: neurovascularly intact Pre-procedure range of motion: reduced Immobilization: sling Splint Applied by: Milon Dikes Post-procedure neurovascular assessment: post-procedure neurovascularly intact Post-procedure range of motion: unchanged       Medications Ordered in ED Medications  oxyCODONE-acetaminophen (PERCOCET/ROXICET) 5-325 MG per tablet 1 tablet (1 tablet Oral Given 05/29/23 1916)  fentaNYL (SUBLIMAZE) injection 75 mcg (75 mcg Intramuscular Given 05/29/23 2255)    ED Course/ Medical Decision Making/ A&P                                 Medical  Decision Making Risk Prescription drug management.   This patient presents to the ED for concern of shoulder pain, this involves an extensive number of treatment options, and is a complaint that carries with it a high risk of complications and morbidity.  The differential diagnosis includes fracture, dislocation, subluxation, soft tissue injury, others   Co morbidities that complicate the patient evaluation  Previous rotator cuff repair   Imaging Studies ordered:  I ordered imaging studies including plain films of the right shoulder  I independently visualized and interpreted imaging which showed  1. No evidence of fractures.  Mild osteopenia.  2. Mild inferior displacement versus a partial inferior subluxation  of the humeral head, which can be seen in the setting of a joint  effusion or glenohumeral ligamentous laxity.  3. Degenerative changes of the glenohumeral joint.  4. Loosely clustered small calcifications in the acromiohumeral  space which could indicate calcific rotator cuff tendinitis or  tendinopathy or could be dystrophic postsurgical calcifications.   I agree with the radiologist interpretation  Problem List / ED Course / Critical interventions / Medication management   I ordered medication including percocet and fentanyl for pain  Reevaluation of the patient after these medicines showed that the patient improved I have reviewed the patients home medicines and have made adjustments as needed   Test / Admission - Considered:  Patient with what appears to be a partial inferior subluxation, consider likely ligamentous or potential rotator cuff injury based on description of injury.  No fracture or significant dislocation noted on imaging.  Patient feeling better at this time.  Patient placed in sling for comfort.  Will patient follow-up with orthopedics.  Ortho follow-up information provided to patient.  He states it has been over a decade since he has seen  orthopedic surgeon.  Patient will use over-the-counter medication for pain control at home.        Final Clinical Impression(s) / ED Diagnoses Final diagnoses:  Injury of right shoulder, initial encounter    Rx / DC Orders ED Discharge Orders     None         Pamala Duffel 05/29/23 2349    Tilden Fossa, MD 05/30/23 [phone]

## 2023-05-29 NOTE — ED Triage Notes (Signed)
The pt is c/o rt shoulder pain since yesterday after he lifted a heavy object  he has had surgery on that rt shoulder and he thinks he may have broken something again

## 2023-05-30 MED ORDER — OXYCODONE-ACETAMINOPHEN 5-325 MG PO TABS: 1.0000 | ORAL_TABLET | Freq: Four times a day (QID) | ORAL | 0 refills | Status: DC | PRN

## 2023-05-30 NOTE — Progress Notes (Signed)
Orthopedic Tech Progress Note Patient Details:  [name] 1960/10/18 [id number]  Applied sling immobilizer to RUE Ortho Devices Type of Ortho Device: Sling immobilizer Ortho Device/Splint Location: RYE Ortho Device/Splint Interventions: Ordered, Application, Adjustment   Post Interventions Patient Tolerated: Well Instructions Provided: Adjustment of device, Care of device  [name] 05/30/2023, 12:11 AM

## 2023-06-06 ENCOUNTER — Ambulatory Visit (HOSPITAL_BASED_OUTPATIENT_CLINIC_OR_DEPARTMENT_OTHER): Payer: MEDICAID | Admitting: Orthopaedic Surgery

## 2023-06-06 ENCOUNTER — Encounter (HOSPITAL_BASED_OUTPATIENT_CLINIC_OR_DEPARTMENT_OTHER): Payer: Self-pay | Admitting: Orthopaedic Surgery

## 2023-06-06 DIAGNOSIS — S46001A Unspecified injury of muscle(s) and tendon(s) of the rotator cuff of right shoulder, initial encounter: Secondary | ICD-10-CM

## 2023-06-06 NOTE — Progress Notes (Signed)
Chief Complaint: Right shoulder pain     History of Present Illness:    [name] is a 63 y.o. male presents today with ongoing right shoulder pain after recent injury where he is throwing a trash bag into his trash bin and suddenly felt a pop with inability to lift the shoulder.  Of note he is 10 years status post previous right shoulder rotator cuff repair.  He was doing well until this occurred.  Since this time he has had very limited overhead range of motion of the shoulder.  He has been having pain with sleep with laying directly on the side.  This is his dominant side.  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    PMH/PSH/Family History/Social History/Meds/Allergies:    Past Medical History:  Diagnosis Date   Alcohol abuse    Allergy    Anxiety    Arthritis    Diverticulosis of colon (without mention of hemorrhage)    GERD (gastroesophageal reflux disease)    Gout    History of kidney stones    Hyperlipemia    Hypertension    dr Kathlene November  norins   Kidney stones    Nephrolithiasis    Pancreatitis    Hx of    Ureteral stent displacement (HCC) 1990   Past Surgical History:  Procedure Laterality Date   A2 pulley flexor sheath cyst 4th finger excisional biopsy     '03   ANTERIOR CRUCIATE LIGAMENT REPAIR     R knee   ESOPHAGOGASTRODUODENOSCOPY N/A 07/09/2012   Procedure: ESOPHAGOGASTRODUODENOSCOPY (EGD);  Surgeon: Louis Meckel, MD;  Location: [facility];  Service: Endoscopy;  Laterality: N/A;   KNEE SURGERY  2008   LEFT HEART CATH AND CORONARY ANGIOGRAPHY N/A 08/10/2019   Procedure: LEFT HEART CATH AND CORONARY ANGIOGRAPHY;  Surgeon: Runell Gess, MD;  Location: [facility];  Service: Cardiovascular;  Laterality: N/A;   SHOULDER ARTHROSCOPY WITH SUBACROMIAL DECOMPRESSION AND OPEN ROTATOR C Right 12/26/2012   Procedure: RIGHT SHOULDER ARTHROSCOPY WITH SUBACROMIAL DECOMPRESSION MINI OPEN ROTATOR CUFF REPAIR, OPEN BICEP TENODESIS OPEN DISTAL CLAVICLE  RESECTION  ;  Surgeon: Verlee Rossetti, MD;  Location: [facility] OR;  Service: Orthopedics;  Laterality: Right;   TRANSFORAMINAL LUMBAR INTERBODY FUSION (TLIF) WITH PEDICLE SCREW FIXATION 1 LEVEL Right 03/27/2018   Procedure: RIGHT-SIDED LUMBAR 4-5 TRANSFORAMINAL LUMBAR INTERBODY FUSION WITH INSTRUMENTATION AND ALLOGRAFT;  Surgeon: Estill Bamberg, MD;  Location: [facility] OR;  Service: Orthopedics;  Laterality: Right;   URETEROSCOPY     and stone retreval   Social History   Socioeconomic History   Marital status: Legally Separated    Spouse name: Not on file   Number of children: 3   Years of education: Not on file   Highest education level: Not on file  Occupational History    Employer: [employer]    Comment: Pharmacologist , retired  Tobacco Use   Smoking status: Former    Current packs/day: 0.00    Types: Cigarettes    Quit date: 07/10/1998    Years since quitting: 24.9   Smokeless tobacco: Never  Vaping Use   Vaping status: Never Used  Substance and Sexual Activity   Alcohol use: Yes    Alcohol/week: 6.0 standard drinks of alcohol    Types: 6 Cans of beer per week    Comment: a couple of beers a day   Drug use: No   Sexual activity: Yes  Other Topics Concern  Not on file  Social History Narrative   HSG   [employer] groundskeeper   Difficult domestic situation, Married '87- separated '01   2 sons- '82, '91; 1 daughter '91   His niece is 69 with metastatic breast cancer (feb '12)   Social Drivers of Corporate investment banker Strain: Not on Ship broker Insecurity: Not on file  Transportation Needs: Not on file  Physical Activity: Not on file  Stress: Not on file  Social Connections: Not on file   Family History  Problem Relation Age of Onset   Diabetes Mother        x2 brothers   Hypertension Father        X4 siblings   Heart disease Father    Colon cancer Neg Hx    Esophageal cancer Neg Hx    Allergies  Allergen Reactions   Hydrocodone-Acetaminophen Palpitations    Shrimp [Shellfish Allergy] Other (See Comments)    Cholesterol level becomes very high. Patient reports sweating and itching.   Current Outpatient Medications  Medication Sig Dispense Refill   amLODipine (NORVASC) 10 MG tablet Take 1 tablet (10 mg total) by mouth daily. 90 tablet 1   atorvastatin (LIPITOR) 40 MG tablet Take 1 tablet (40 mg total) by mouth daily. 90 tablet 3   carvedilol (COREG) 25 MG tablet Take 1 tablet (25 mg total) by mouth 2 (two) times daily with a meal. 180 tablet 1   cyclobenzaprine (FLEXERIL) 10 MG tablet Take 1 tablet by mouth three times daily as needed for muscle spasm 60 tablet 1   losartan (COZAAR) 25 MG tablet Take 1 tablet (25 mg total) by mouth daily. 90 tablet 3   Melatonin 10 MG CAPS Take 10 mg by mouth at bedtime as needed. 90 capsule 1   Multiple Vitamins-Minerals (MULTIVITAMIN WITH MINERALS) tablet Take 1 tablet by mouth daily.     omeprazole (PRILOSEC) 40 MG capsule Take 1 capsule (40 mg total) by mouth daily. 90 capsule 1   oxyCODONE-acetaminophen (PERCOCET/ROXICET) 5-325 MG tablet Take 1 tablet by mouth every 6 (six) hours as needed for severe pain (pain score 7-10). 10 tablet 0   potassium chloride SA (KLOR-CON M) 20 MEQ tablet Take 1 tablet (20 mEq total) by mouth daily. 14 tablet 0   Rimegepant Sulfate (NURTEC) 75 MG TBDP Take 75 mg by mouth daily as needed. 8 tablet 6   rizatriptan (MAXALT-MLT) 10 MG disintegrating tablet Take 1 tablet (10 mg total) by mouth as needed for migraine. May repeat in 2 hours if needed 9 tablet 11   Tetrahydrozoline HCl (VISINE OP) Place 1 drop into both eyes daily as needed (redness).     triamcinolone cream (KENALOG) 0.1 % Apply 1 application topically 2 (two) times daily. (Patient taking differently: Apply 1 application  topically as needed.) 60 g 0   No current facility-administered medications for this visit.   No results found.  Review of Systems:   A ROS was performed including pertinent positives and negatives  as documented in the HPI.  Physical Exam :   Constitutional: NAD and appears stated age Neurological: Alert and oriented Psych: Appropriate affect and cooperative There were no vitals taken for this visit.   Comprehensive Musculoskeletal Exam:    Musculoskeletal Exam    Inspection Right Left  Skin No atrophy or winging No atrophy or winging  Palpation    Tenderness Anterior lateral deltoid None  Range of Motion    Flexion (passive) 150 170  Flexion (  active) 65 170  Abduction 60 170  ER at the side 30 70  Can reach behind back to L3 T12  Strength     4 out of 5 with forward elevation negative belly press Full  Special Tests    Pseudoparalytic No No  Neurologic    Fires PIN, radial, median, ulnar, musculocutaneous, axillary, suprascapular, long thoracic, and spinal accessory innervated muscles. No abnormal sensibility  Vascular/Lymphatic    Radial Pulse 2+ 2+  Cervical Exam    Patient has symmetric cervical range of motion with negative Spurling's test.  Special Test: Positive drop arm      Imaging:   Xray (3 views right shoulder): Inferior glenohumeral osteophyte consistent with mild osteoarthritis     I personally reviewed and interpreted the radiographs.   Assessment and Plan:   63 y.o. male right-hand-dominant male with right shoulder pain and essentially pseudoparalysis after attempting to throw a garbage bag in the garbage can.  At this time I did discuss that I am concerned about a retear of his rotator cuff repair.  This time we will plan for an MRI of the right shoulder and follow-up to discuss results.  Ultimately I do believe he may be a candidate for reverse shoulder arthroplasty given the inferior glenohumeral osteophyte.  -Plan for MRI right shoulder follow-up discuss results   I personally saw and evaluated the patient, and participated in the management and treatment plan.  Huel Cote, MD Attending Physician, Orthopedic Surgery  This document  was dictated using Dragon voice recognition software. A reasonable attempt at proof reading has been made to minimize errors.

## 2023-06-08 ENCOUNTER — Ambulatory Visit
Admission: RE | Admit: 2023-06-08 | Discharge: 2023-06-08 | Disposition: A | Discharge status: Home / Self Care | Payer: MEDICAID | Source: Ambulatory Visit | Attending: Orthopaedic Surgery

## 2023-06-08 DIAGNOSIS — S46001A Unspecified injury of muscle(s) and tendon(s) of the rotator cuff of right shoulder, initial encounter: Secondary | ICD-10-CM

## 2023-07-02 ENCOUNTER — Other Ambulatory Visit (INDEPENDENT_AMBULATORY_CARE_PROVIDER_SITE_OTHER): Payer: Self-pay | Admitting: Primary Care

## 2023-07-02 DIAGNOSIS — Z760 Encounter for issue of repeat prescription: Secondary | ICD-10-CM

## 2023-07-03 NOTE — Telephone Encounter (Signed)
 Requested Prescriptions  Pending Prescriptions Disp Refills   amLODipine (NORVASC) 10 MG tablet [Pharmacy Med Name: amLODIPine Besylate 10 MG Oral Tablet] 90 tablet 0    Sig: Take 1 tablet by mouth once daily     Cardiovascular: Calcium Channel Blockers 2 Failed - 07/03/2023 12:23 PM      Failed - Last BP in normal range    BP Readings from Last 1 Encounters:  05/29/23 (!) 139/115         Passed - Last Heart Rate in normal range    Pulse Readings from Last 1 Encounters:  05/29/23 76         Passed - Valid encounter within last 6 months    Recent Outpatient Visits           3 months ago Essential hypertension   [place] [facility] Grayce Sessions, NP   8 months ago Toe infection   [place] [facility] Grayce Sessions, NP   1 year ago Essential hypertension   [place] [facility] Grayce Sessions, NP   1 year ago Screening for diabetes mellitus   [place] [facility] Grayce Sessions, NP   1 year ago Hospital discharge follow-up   [place] [facility] Grayce Sessions, NP       Future Appointments             In 2 days Huel Cote, MD [facility] at [facility], [place]

## 2023-07-05 ENCOUNTER — Ambulatory Visit (INDEPENDENT_AMBULATORY_CARE_PROVIDER_SITE_OTHER): Payer: MEDICAID | Admitting: Orthopaedic Surgery

## 2023-07-05 DIAGNOSIS — M25511 Pain in right shoulder: Secondary | ICD-10-CM | POA: Diagnosis not present

## 2023-07-05 DIAGNOSIS — S46001A Unspecified injury of muscle(s) and tendon(s) of the rotator cuff of right shoulder, initial encounter: Secondary | ICD-10-CM

## 2023-07-05 MED ORDER — LIDOCAINE HCL 1 % IJ SOLN
4.0000 mL | INTRAMUSCULAR | Status: AC | PRN
  Administered 2023-07-05: 4 mL

## 2023-07-05 MED ORDER — TRIAMCINOLONE ACETONIDE 40 MG/ML IJ SUSP
80.0000 mg | INTRAMUSCULAR | Status: AC | PRN
  Administered 2023-07-05: 80 mg via INTRA_ARTICULAR

## 2023-07-05 NOTE — Progress Notes (Addendum)
 Chief Complaint: Right shoulder pain     History of Present Illness:   07/05/2023: Presents today for follow-up of his right shoulder.  He is here today for MRI discussion  [name] is a 63 y.o. male presents today with ongoing right shoulder pain after recent injury where he is throwing a trash bag into his trash bin and suddenly felt a pop with inability to lift the shoulder.  Of note he is 10 years status post previous right shoulder rotator cuff repair.  He was doing well until this occurred.  Since this time he has had very limited overhead range of motion of the shoulder.  He has been having pain with sleep with laying directly on the side.  This is his dominant side.  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    PMH/PSH/Family History/Social History/Meds/Allergies:    Past Medical History:  Diagnosis Date  . Alcohol abuse   . Allergy   . Anxiety   . Arthritis   . Diverticulosis of colon (without mention of hemorrhage)   . GERD (gastroesophageal reflux disease)   . Gout   . History of kidney stones   . Hyperlipemia   . Hypertension    dr Kathlene November  norins  . Kidney stones   . Nephrolithiasis   . Pancreatitis    Hx of   . Ureteral stent displacement (HCC) 1990   Past Surgical History:  Procedure Laterality Date  . A2 pulley flexor sheath cyst 4th finger excisional biopsy     '03  . ANTERIOR CRUCIATE LIGAMENT REPAIR     R knee  . ESOPHAGOGASTRODUODENOSCOPY N/A 07/09/2012   Procedure: ESOPHAGOGASTRODUODENOSCOPY (EGD);  Surgeon: Louis Meckel, MD;  Location: [facility];  Service: Endoscopy;  Laterality: N/A;  . KNEE SURGERY  2008  . LEFT HEART CATH AND CORONARY ANGIOGRAPHY N/A 08/10/2019   Procedure: LEFT HEART CATH AND CORONARY ANGIOGRAPHY;  Surgeon: Runell Gess, MD;  Location: [facility];  Service: Cardiovascular;  Laterality: N/A;  . SHOULDER ARTHROSCOPY WITH SUBACROMIAL DECOMPRESSION AND OPEN ROTATOR C Right 12/26/2012   Procedure: RIGHT  SHOULDER ARTHROSCOPY WITH SUBACROMIAL DECOMPRESSION MINI OPEN ROTATOR CUFF REPAIR, OPEN BICEP TENODESIS OPEN DISTAL CLAVICLE RESECTION  ;  Surgeon: Verlee Rossetti, MD;  Location: [facility];  Service: Orthopedics;  Laterality: Right;  . TRANSFORAMINAL LUMBAR INTERBODY FUSION (TLIF) WITH PEDICLE SCREW FIXATION 1 LEVEL Right 03/27/2018   Procedure: RIGHT-SIDED LUMBAR 4-5 TRANSFORAMINAL LUMBAR INTERBODY FUSION WITH INSTRUMENTATION AND ALLOGRAFT;  Surgeon: Estill Bamberg, MD;  Location: [facility];  Service: Orthopedics;  Laterality: Right;  . URETEROSCOPY     and stone retreval   Social History   Socioeconomic History  . Marital status: Legally Separated    Spouse name: Not on file  . Number of children: 3  . Years of education: Not on file  . Highest education level: Not on file  Occupational History    Employer: [employer]    Comment: Naaman Plummer , retired  Tobacco Use  . Smoking status: Former    Current packs/day: 0.00    Types: Cigarettes    Quit date: 07/10/1998    Years since quitting: 25.0  . Smokeless tobacco: Never  Vaping Use  . Vaping status: Never Used  Substance and Sexual Activity  . Alcohol use: Yes    Alcohol/week: 6.0 standard drinks of alcohol    Types: 6 Cans of beer per week    Comment: a couple of  beers a day  . Drug use: No  . Sexual activity: Yes  Other Topics Concern  . Not on file  Social History Narrative   HSG   [employer] groundskeeper   Difficult domestic situation, Married '87- separated '01   2 sons- '82, '91; 1 daughter '91   His niece is 38 with metastatic breast cancer (feb '12)   Social Drivers of Corporate investment banker Strain: Not on [employer] Insecurity: Not on file  Transportation Needs: Not on file  Physical Activity: Not on file  Stress: Not on file  Social Connections: Not on file   Family History  Problem Relation Age of Onset  . Diabetes Mother        x2 brothers  . Hypertension Father        X4 siblings  . Heart disease  Father   . Colon cancer Neg Hx   . Esophageal cancer Neg Hx    Allergies  Allergen Reactions  . Hydrocodone-Acetaminophen Palpitations  . Shrimp [Shellfish Allergy] Other (See Comments)    Cholesterol level becomes very high. Patient reports sweating and itching.   Current Outpatient Medications  Medication Sig Dispense Refill  . amLODipine (NORVASC) 10 MG tablet Take 1 tablet by mouth once daily 90 tablet 0  . atorvastatin (LIPITOR) 40 MG tablet Take 1 tablet (40 mg total) by mouth daily. 90 tablet 3  . carvedilol (COREG) 25 MG tablet Take 1 tablet (25 mg total) by mouth 2 (two) times daily with a meal. 180 tablet 1  . cyclobenzaprine (FLEXERIL) 10 MG tablet Take 1 tablet by mouth three times daily as needed for muscle spasm 60 tablet 1  . losartan (COZAAR) 25 MG tablet Take 1 tablet (25 mg total) by mouth daily. 90 tablet 3  . Melatonin 10 MG CAPS Take 10 mg by mouth at bedtime as needed. 90 capsule 1  . Multiple Vitamins-Minerals (MULTIVITAMIN WITH MINERALS) tablet Take 1 tablet by mouth daily.    [name] omeprazole (PRILOSEC) 40 MG capsule Take 1 capsule (40 mg total) by mouth daily. 90 capsule 1  . oxyCODONE-acetaminophen (PERCOCET/ROXICET) 5-325 MG tablet Take 1 tablet by mouth every 6 (six) hours as needed for severe pain (pain score 7-10). 10 tablet 0  . potassium chloride SA (KLOR-CON M) 20 MEQ tablet Take 1 tablet (20 mEq total) by mouth daily. 14 tablet 0  . Rimegepant Sulfate (NURTEC) 75 MG TBDP Take 75 mg by mouth daily as needed. 8 tablet 6  . rizatriptan (MAXALT-MLT) 10 MG disintegrating tablet Take 1 tablet (10 mg total) by mouth as needed for migraine. May repeat in 2 hours if needed 9 tablet 11  . Tetrahydrozoline HCl (VISINE OP) Place 1 drop into both eyes daily as needed (redness).    . triamcinolone cream (KENALOG) 0.1 % Apply 1 application topically 2 (two) times daily. (Patient taking differently: Apply 1 application  topically as needed.) 60 g 0   No current  facility-administered medications for this visit.   No results found.  Review of Systems:   A ROS was performed including pertinent positives and negatives as documented in the HPI.  Physical Exam :   Constitutional: NAD and appears stated age Neurological: Alert and oriented Psych: Appropriate affect and cooperative There were no vitals taken for this visit.   Comprehensive Musculoskeletal Exam:    Musculoskeletal Exam    Inspection Right Left  Skin No atrophy or winging No atrophy or winging  Palpation    Tenderness  Anterior lateral deltoid None  Range of Motion    Flexion (passive) 150 170  Flexion (active) 65 170  Abduction 60 170  ER at the side 30 70  Can reach behind back to L3 T12  Strength     4 out of 5 with forward elevation negative belly press Full  Special Tests    Pseudoparalytic No No  Neurologic    Fires PIN, radial, median, ulnar, musculocutaneous, axillary, suprascapular, long thoracic, and spinal accessory innervated muscles. No abnormal sensibility  Vascular/Lymphatic    Radial Pulse 2+ 2+  Cervical Exam    Patient has symmetric cervical range of motion with negative Spurling's test.  Special Test: Positive drop arm      Imaging:   Xray (3 views right shoulder): Inferior glenohumeral osteophyte consistent with mild osteoarthritis   MRI right shoulder: There is glenohumeral advanced changes significant with arthritis in the setting of previous rotator cuff repair  I personally reviewed and interpreted the radiographs.   Assessment and Plan:   63 y.o. male right-hand-dominant male with right shoulder pain in the setting of osteoarthritis of the right shoulder with previous rotator cuff repair.  Today's visit I did recommend an initial ultrasound-guided injection of the right shoulder.  He would like to proceed with this today.  I did discuss briefly the possibility of shoulder arthroplasty although he would like to defer surgery at this  time.   -Right shoulder ultrasound-guided injection provided after verbal consent obtained    Procedure Note  Patient: [name]             Date of Birth: Jul 13, 1960           MRN: [id number]             Visit Date: 07/05/2023  Procedures: Visit Diagnoses:  1. Injury of right rotator cuff, initial encounter     Large Joint Inj on 07/05/2023 3:01 PM Indications: pain Details: 22 G 1.5 in needle, ultrasound-guided anterior approach  Arthrogram: No  Medications: 4 mL lidocaine 1 %; 80 mg triamcinolone acetonide 40 MG/ML Outcome: tolerated well, no immediate complications Procedure, treatment alternatives, risks and benefits explained, specific risks discussed. Consent was given by the patient. Immediately prior to procedure a time out was called to verify the correct patient, procedure, equipment, support staff and site/side marked as required. Patient was prepped and draped in the usual sterile fashion.         I personally saw and evaluated the patient, and participated in the management and treatment plan.  Huel Cote, MD Attending Physician, Orthopedic Surgery  This document was dictated using Dragon voice recognition software. A reasonable attempt at proof reading has been made to minimize errors.

## 2023-09-05 ENCOUNTER — Other Ambulatory Visit: Payer: Self-pay | Admitting: General Practice

## 2023-09-05 ENCOUNTER — Telehealth: Payer: Self-pay | Admitting: Cardiovascular Disease

## 2023-09-05 DIAGNOSIS — E781 Pure hyperglyceridemia: Secondary | ICD-10-CM

## 2023-09-05 DIAGNOSIS — E785 Hyperlipidemia, unspecified: Secondary | ICD-10-CM

## 2023-09-05 DIAGNOSIS — Z760 Encounter for issue of repeat prescription: Secondary | ICD-10-CM

## 2023-09-05 DIAGNOSIS — I10 Essential (primary) hypertension: Secondary | ICD-10-CM

## 2023-09-05 MED ORDER — ATORVASTATIN CALCIUM 40 MG PO TABS: 40.0000 mg | ORAL_TABLET | Freq: Every day | ORAL | 0 refills | Status: DC

## 2023-09-05 MED ORDER — CARVEDILOL 25 MG PO TABS: 25.0000 mg | ORAL_TABLET | Freq: Two times a day (BID) | ORAL | 0 refills | Status: DC

## 2023-09-05 MED ORDER — LOSARTAN POTASSIUM 25 MG PO TABS: 25.0000 mg | ORAL_TABLET | Freq: Every day | ORAL | 0 refills | Status: DC

## 2023-09-05 NOTE — Telephone Encounter (Signed)
*  STAT* If patient is at the pharmacy, call can be transferred to refill team.   1. Which medications need to be refilled? (please list name of each medication and dose if known)   atorvastatin  (LIPITOR) 40 MG tablet    losartan  (COZAAR ) 25 MG tablet  carvedilol  (COREG ) 25 MG tablet  2. Which pharmacy/location (including street and city if local pharmacy) is medication to be sent to? [facility] - [place],  - [address] Phone: [phone]  Fax: [phone]     3. Do they need a 30 day or 90 day supply? Pt has made an  appt 09/25/23 please refill until then

## 2023-09-05 NOTE — Telephone Encounter (Signed)
 Pt's medications were sent to pt's pharmacy as requested. Confirmation received.

## 2023-09-17 ENCOUNTER — Encounter (HOSPITAL_COMMUNITY): Payer: Self-pay

## 2023-09-17 ENCOUNTER — Other Ambulatory Visit: Payer: Self-pay

## 2023-09-17 ENCOUNTER — Emergency Department (HOSPITAL_COMMUNITY)
Admission: EM | Admit: 2023-09-17 | Discharge: 2023-09-17 | Disposition: A | Discharge status: Home / Self Care | Payer: MEDICAID | Attending: Emergency Medicine | Admitting: Emergency Medicine

## 2023-09-17 DIAGNOSIS — M25571 Pain in right ankle and joints of right foot: Secondary | ICD-10-CM | POA: Diagnosis not present

## 2023-09-17 DIAGNOSIS — M25471 Effusion, right ankle: Secondary | ICD-10-CM

## 2023-09-17 DIAGNOSIS — M25561 Pain in right knee: Secondary | ICD-10-CM | POA: Diagnosis present

## 2023-09-17 DIAGNOSIS — M25469 Effusion, unspecified knee: Secondary | ICD-10-CM

## 2023-09-17 DIAGNOSIS — I10 Essential (primary) hypertension: Secondary | ICD-10-CM | POA: Diagnosis not present

## 2023-09-17 DIAGNOSIS — Z79899 Other long term (current) drug therapy: Secondary | ICD-10-CM | POA: Diagnosis not present

## 2023-09-17 DIAGNOSIS — M25472 Effusion, left ankle: Secondary | ICD-10-CM

## 2023-09-17 MED ORDER — NAPROXEN 500 MG PO TABS: 500.0000 mg | ORAL_TABLET | Freq: Two times a day (BID) | ORAL | 0 refills | Status: AC

## 2023-09-17 NOTE — ED Triage Notes (Signed)
 Pt ambulatory to room 6, pt states that he frequently gets swelling in his knee and foot, but this episode has gone on for the past two weeks and is not getting any better.  States that he has tried motrin  and tylenol  and ice, but it isn't getting any better.

## 2023-09-17 NOTE — ED Notes (Signed)
 Pt has 2 bags of belongings ( Triage).

## 2023-09-17 NOTE — Discharge Instructions (Addendum)
 It was a pleasure taking care of you today.  Today we evaluated your right knee and right ankle swelling.  Based on your history, as well as physical exam we did not feel that imaging was needed today.  Today you are prescribed an anti-inflammatory medicine to take outpatient, please take as prescribed.  You were also given an Ace compression wrap and crutches for comfort.  Please take the anti-inflammatory medication, continue to rest and ice the area, use the compression wrap and crutches.  Please follow-up with your primary care provider if symptoms persist or worsen, also an orthopedic referral was placed today if you would like to directly contact them.  Please return to the emergency department if you experience the following symptoms including but not limited to fever, chills, red hot swollen joints, signs of infection, calf redness or swelling, shortness of breath or chest pain.

## 2023-09-17 NOTE — ED Provider Notes (Signed)
 [name] EMERGENCY DEPARTMENT AT [facility] Provider Note   CSN: [id number] Arrival date & time: 09/17/23  2130     History  Chief Complaint  Patient presents with   Joint Swelling    [name] is a 63 y.o. male who presents to the emergency department with a chief complaint of right knee and ankle swelling.  Patient states that his right knee and ankle have been swollen for the past 2 weeks.  At home he has tried Motrin , Tylenol , ice and states that he has seen no improvement in pain or swelling.  States these episodes have happened in the past, but with rest and over-the-counter anti-inflammatories the swelling has always gone away as well as the pain. Patient states he has had a previous knee surgery but is unsure exactly what was done, stating it was years ago whenever he hurt his knee playing basketball.  History significant for hypertension, BPH, alcohol use.  Denies fever, chills, history of gout.  Denies injury/trauma.   HPI     Home Medications Prior to Admission medications   Medication Sig Start Date End Date Taking? Authorizing Provider  amLODipine  (NORVASC ) 10 MG tablet Take 1 tablet by mouth once daily 07/03/23   Marius Siemens, NP  atorvastatin  (LIPITOR) 40 MG tablet Take 1 tablet (40 mg total) by mouth daily. 09/05/23   Avanell Leigh, MD  carvedilol  (COREG ) 25 MG tablet Take 1 tablet (25 mg total) by mouth 2 (two) times daily with a meal. 09/05/23   Avanell Leigh, MD  cyclobenzaprine  (FLEXERIL ) 10 MG tablet Take 1 tablet by mouth three times daily as needed for muscle spasm 08/09/21   Marius Siemens, NP  losartan  (COZAAR ) 25 MG tablet Take 1 tablet (25 mg total) by mouth daily. 09/05/23   Avanell Leigh, MD  Melatonin 10 MG CAPS Take 10 mg by mouth at bedtime as needed. 01/10/22   Marius Siemens, NP  Multiple Vitamins-Minerals (MULTIVITAMIN WITH MINERALS) tablet Take 1 tablet by mouth daily.    [provider]  omeprazole   (PRILOSEC) 40 MG capsule Take 1 capsule (40 mg total) by mouth daily. 04/09/23   Marius Siemens, NP  oxyCODONE -acetaminophen  (PERCOCET/ROXICET) 5-325 MG tablet Take 1 tablet by mouth every 6 (six) hours as needed for severe pain (pain score 7-10). 05/30/23   Elisa Guest, PA-C  potassium chloride  SA (KLOR-CON  M) 20 MEQ tablet Take 1 tablet (20 mEq total) by mouth daily. 09/21/21   Tobin Forts, MD  Rimegepant Sulfate (NURTEC) 75 MG TBDP Take 75 mg by mouth daily as needed. 09/19/20   Penumalli, Vikram R, MD  rizatriptan  (MAXALT -MLT) 10 MG disintegrating tablet Take 1 tablet (10 mg total) by mouth as needed for migraine. May repeat in 2 hours if needed 09/19/20   Penumalli, Vikram R, MD  Tetrahydrozoline HCl (VISINE OP) Place 1 drop into both eyes daily as needed (redness).    [provider]  triamcinolone  cream (KENALOG ) 0.1 % Apply 1 application topically 2 (two) times daily. Patient taking differently: Apply 1 application  topically as needed. 09/08/19   Buena Carmine, NP  diphenhydrAMINE  (BENADRYL ) 25 MG tablet Take 1 tablet (25 mg total) by mouth every 6 (six) hours as needed. 04/04/20 06/17/20  Wynetta Heckle, MD  metoCLOPramide  (REGLAN ) 10 MG tablet Take 1 tablet (10 mg total) by mouth every 6 (six) hours as needed for nausea (nausea/headache). Take this tablet with 25 mg of Benadryl  and two extra  strength Tylenol  for headache. 04/27/20 06/17/20  Marius Siemens, NP      Allergies    Hydrocodone -acetaminophen  and Shrimp [shellfish allergy]    Review of Systems   Review of Systems  Constitutional:  Positive for activity change. Negative for appetite change, chills and fever.  Respiratory:  Negative for cough, chest tightness and shortness of breath.   Cardiovascular:  Negative for chest pain.  Gastrointestinal:  Negative for abdominal pain, diarrhea, nausea and vomiting.  Musculoskeletal:  Positive for arthralgias and gait problem.  Skin:  Negative for rash and wound.   Neurological:  Negative for dizziness, seizures, syncope, weakness, numbness and headaches.    Physical Exam Updated Vital Signs BP (!) 158/103 (BP Location: Left Arm)   Pulse 89   Temp 98.2 F (36.8 C) (Oral)   Resp 17   Ht 6' (1.829 m)   Wt 117.9 kg   SpO2 94%   BMI 35.26 kg/m  Physical Exam Vitals and nursing note reviewed.  Constitutional:      General: He is awake. He is not in acute distress.    Appearance: He is not ill-appearing, toxic-appearing or diaphoretic.  HENT:     Head: Normocephalic and atraumatic.  Eyes:     Extraocular Movements: Extraocular movements intact.  Pulmonary:     Effort: Pulmonary effort is normal.  Musculoskeletal:     Right lower leg: Swelling, tenderness and bony tenderness present. No deformity or lacerations.     Right ankle: Swelling present. No deformity, ecchymosis or lacerations. Tenderness present over the lateral malleolus and medial malleolus. Normal range of motion.     Left ankle: Swelling present. Normal range of motion.     Comments: Moderate R knee effusion, surgical scar present, no warmth or redness of R knee joint. ROM intact, strength and sensation intact. Pain with palpation superior to the patella.   Moderate R ankle effusion present, pain with palpation of area surrounding medial and lateral malleolus.   Moderate L ankle effusion present also, patient states no pain with palpation  Skin:    General: Skin is warm and dry.     Capillary Refill: Capillary refill takes less than 2 seconds.     Findings: No lesion or rash.  Neurological:     General: No focal deficit present.     Mental Status: He is alert and oriented to person, place, and time.     Cranial Nerves: No cranial nerve deficit.     Sensory: No sensory deficit.     Motor: No weakness.     Coordination: Coordination normal.  Psychiatric:        Mood and Affect: Mood normal.        Behavior: Behavior normal. Behavior is cooperative.    ED Results /  Procedures / Treatments   Labs (all labs ordered are listed, but only abnormal results are displayed) Labs Reviewed - No data to display  EKG None  Radiology No results found.  Procedures Procedures    Medications Ordered in ED Medications - No data to display  ED Course/ Medical Decision Making/ A&P   Patient presents to the ED for concern of right knee and ankle pain/swelling, this involves an extensive number of treatment options, and is a complaint that carries with it a high risk of complications and morbidity.  The differential diagnosis includes gout, pseudogout, osteoarthritis, rheumatoid arthritis, trauma/injury, etc.   Co morbidities that complicate the patient evaluation  None  Medicines ordered and prescription drug management:  I ordered medication including naproxen  for pain and inflammation outpatient Reevaluation of the patient after these medicines showed that the patient stayed the same I have reviewed the patients home medicines and have made adjustments as needed  Test Considered:  R knee and ankle imaging: spoke with attending and declined due to no trauma/injury reported, ROM and strength intact, otherwise neurovascularly intact  CBC: patient vitals stable, no diabetic history, no fever, chills, no source of infection, joint not red or hot   Critical Interventions:  none  Problem List / ED Course:  Right knee, right ankle pain and swelling Based on history and physical exam, low clinical suspicion for fracture Patient has no history of gout, joint swollen but not red and hot today, ROM intact, sensation as well as strength intact Patient discussed with attending who recommended declining imaging at this time, patient ordered compression ace wrap and crutches. Prescribed 7 day course of naproxen  for outpatient pain and inflammation Patient discharged and given referral to orthopedics as well as recommended follow-up with PCP  Return precautions  given    Reevaluation:  After the interventions noted above, I reevaluated the patient and found that they have :stayed the same   Social Determinants of Health:  none   Dispostion:  After consideration of the diagnostic results and the patients response to treatment, I feel that the patent would benefit from discharge and follow-up with PCP/orthopedics if symptoms persist or worsen. Return precautions to the ED given including fever, chills, red/hot/swollen joint, changes in strength, sensation, or motor function. Patient instructed to continue use of compression wrap and crutches.   Click here for ABCD2, HEART and other calculatorsREFRESH Note before signing :1}                              Medical Decision Making         Final Clinical Impression(s) / ED Diagnoses Final diagnoses:  None    Rx / DC Orders ED Discharge Orders     None         Fonda Hymen, PA-C 09/17/23 3086    Auston Blush, MD 09/18/23 [id number]

## 2023-09-25 ENCOUNTER — Ambulatory Visit: Payer: MEDICAID | Admitting: Cardiovascular Disease

## 2023-10-03 ENCOUNTER — Telehealth: Payer: Self-pay

## 2023-10-03 ENCOUNTER — Other Ambulatory Visit (INDEPENDENT_AMBULATORY_CARE_PROVIDER_SITE_OTHER): Payer: Self-pay | Admitting: Primary Care

## 2023-10-03 ENCOUNTER — Other Ambulatory Visit: Payer: Self-pay | Admitting: Cardiovascular Disease

## 2023-10-03 ENCOUNTER — Telehealth: Payer: Self-pay | Admitting: Cardiovascular Disease

## 2023-10-03 DIAGNOSIS — K219 Gastro-esophageal reflux disease without esophagitis: Secondary | ICD-10-CM

## 2023-10-03 DIAGNOSIS — E785 Hyperlipidemia, unspecified: Secondary | ICD-10-CM

## 2023-10-03 DIAGNOSIS — I10 Essential (primary) hypertension: Secondary | ICD-10-CM

## 2023-10-03 DIAGNOSIS — Z760 Encounter for issue of repeat prescription: Secondary | ICD-10-CM

## 2023-10-03 DIAGNOSIS — E781 Pure hyperglyceridemia: Secondary | ICD-10-CM

## 2023-10-03 MED ORDER — OMEPRAZOLE 40 MG PO CPDR: 40.0000 mg | DELAYED_RELEASE_CAPSULE | Freq: Every day | ORAL | 1 refills | Status: DC

## 2023-10-03 MED ORDER — ATORVASTATIN CALCIUM 40 MG PO TABS: 40.0000 mg | ORAL_TABLET | Freq: Every day | ORAL | 0 refills | Status: DC

## 2023-10-03 MED ORDER — LOSARTAN POTASSIUM 25 MG PO TABS
25.0000 mg | ORAL_TABLET | Freq: Every day | ORAL | 0 refills | Status: DC
Start: 2023-10-03 — End: 2023-11-05

## 2023-10-03 NOTE — Telephone Encounter (Signed)
*  STAT* If patient is at the pharmacy, call can be transferred to refill team.   1. Which medications need to be refilled? (please list name of each medication and dose if known)   losartan  (COZAAR ) 25 MG tablet  atorvastatin  (LIPITOR) 40 MG tablet    4. Which pharmacy/location (including street and city if local pharmacy) is medication to be sent to? [facility] - [place], [place] - [address]     5. Do they need a 30 day or 90 day supply? 90     Pt scheduled for 11/05/23

## 2023-10-03 NOTE — Telephone Encounter (Signed)
*  STAT* If patient is at the pharmacy, call can be transferred to refill team.   1. Which medications need to be refilled? (please list name of each medication and dose if known)  amLODipine  (NORVASC ) 10 MG tablet  2. Which pharmacy/location (including street and city if local pharmacy) is medication to be sent to? [facility] - [place], [place] - [address] Phone: [phone]  Fax: [phone]     3. Do they need a 30 day or 90 day supply? 90 Pt is out of medication

## 2023-10-03 NOTE — Telephone Encounter (Signed)
 Patient called and advised the refill for amlodipine  has been received and that the provider noted to return in 6 months from last OV. He verbalized understanding and said to go ahead and schedule. Patient scheduled for 10/17/23. He says that amlodipine  is the one medication for BP that he doesn't have, so he didn't take the others this morning like he normally does. Advised he can go ahead and take his BP medications now and when he pick up the amlodipine  that I will send in today, to take that as well. Advised the importance not to skip his medications, even if he doesn't have all of the BP meds to take, the one's he has will start lowering the BP. He verbalized understanding.  Copied from CRM [id number]. Topic: General - Other >> Oct 03, 2023  3:45 PM Emylou G wrote: Reason for CRM: patient has only 3 bp pills to take.. missing the main one.. should he wait to take them tomorrow?  He is very concerned .Aaron Aas He did put in for refills he said

## 2023-10-03 NOTE — Telephone Encounter (Signed)
 Requested Prescriptions  Pending Prescriptions Disp Refills   omeprazole  (PRILOSEC) 40 MG capsule 90 capsule 1    Sig: Take 1 capsule (40 mg total) by mouth daily.     Gastroenterology: Proton Pump Inhibitors Passed - 10/03/2023  4:05 PM      Passed - Valid encounter within last 12 months    Recent Outpatient Visits           6 months ago Essential hypertension   [facility] Marius Siemens, NP   11 months ago Toe infection   [place] [facility] Marius Siemens, NP   1 year ago Essential hypertension   [place] [facility] Marius Siemens, NP   1 year ago Screening for diabetes mellitus   [place] [facility] Marius Siemens, NP   1 year ago Hospital discharge follow-up    [facility] Marius Siemens, NP       Future Appointments             In 1 month Katheryne Pane, Frederico Jan, MD [facility] of [facility] [employer], [employer]

## 2023-10-03 NOTE — Telephone Encounter (Signed)
 Copied from CRM [id number]. Topic: Clinical - Medication Refill >> Oct 03, 2023  1:26 PM Chrystal Crape R wrote: Medication: omeprazole  (PRILOSEC) 40 MG capsule  Has the patient contacted their pharmacy? No , pt will also contact pharmacy but bottle says no refills  This is the patient's preferred pharmacy:   [id number] [place], [place] - [address] AT [address] [place] [address] Phone: [phone] Fax: [phone]   Is this the correct pharmacy for this prescription? Yes If no, delete pharmacy and type the correct one.   Has the prescription been filled recently? Yes 09/11/2023  Is the patient out of the medication? No  Has the patient been seen for an appointment in the last year OR does the patient have an upcoming appointment? Yes  Can we respond through MyChart?   Agent: Please be advised that Rx refills may take up to 3 business days. We ask that you follow-up with your pharmacy.

## 2023-10-03 NOTE — Telephone Encounter (Signed)
 Copied from CRM [id number]. Topic: Clinical - Medication Refill >> Oct 03, 2023  2:26 PM Phil Braun wrote: Medication: amLODipine  (NORVASC ) 10 MG tablet  Has the patient contacted their pharmacy? Yes   This is the patient's preferred pharmacy:   [facility] [address] [place], [place] - [address] [place] [address] Phone: [phone] Fax: [phone]    Is this the correct pharmacy for this prescription? Yes If no, delete pharmacy and type the correct one.   Has the prescription been filled recently? Yes  Is the patient out of the medication? Yes  Has the patient been seen for an appointment in the last year OR does the patient have an upcoming appointment? Yes  Can we respond through MyChart? No  Agent: Please be advised that Rx refills may take up to 3 business days. We ask that you follow-up with your pharmacy.

## 2023-10-03 NOTE — Telephone Encounter (Signed)
 Called pt to inform him that he needed to contact his PCP to get his medication amlodipine  refilled. Pt's PCP refills this medication. I advised the pt that if he has any other cardiac problems, questions or concerns, to give our office a call. Pt verbalized understanding.

## 2023-10-03 NOTE — Telephone Encounter (Signed)
 Pt's medications were sent to pt's pharmacy as requested. Confirmation received.

## 2023-10-03 NOTE — Telephone Encounter (Signed)
 Refill sent today in a separate encounter.

## 2023-10-03 NOTE — Telephone Encounter (Signed)
 Requested Prescriptions  Pending Prescriptions Disp Refills   amLODipine  (NORVASC ) 10 MG tablet [Pharmacy Med Name: amLODIPine  Besylate 10 MG Oral Tablet] 90 tablet 0    Sig: Take 1 tablet by mouth once daily     Cardiovascular: Calcium  Channel Blockers 2 Failed - 10/03/2023  4:23 PM      Failed - Last BP in normal range    BP Readings from Last 1 Encounters:  09/17/23 (!) 148/99         Failed - Valid encounter within last 6 months    Recent Outpatient Visits           6 months ago Essential hypertension   [place] [facility] Marius Siemens, NP   11 months ago Toe infection   [place] [facility] Marius Siemens, NP   1 year ago Essential hypertension   [place] [facility] Marius Siemens, NP   1 year ago Screening for diabetes mellitus   [place] [facility] Marius Siemens, NP   1 year ago Hospital discharge follow-up    [facility] Marius Siemens, NP       Future Appointments             In 1 month Katheryne Pane, Frederico Jan, MD [facility], [facility]            Passed - Last Heart Rate in normal range    Pulse Readings from Last 1 Encounters:  09/17/23 83

## 2023-10-16 ENCOUNTER — Other Ambulatory Visit: Payer: Self-pay

## 2023-10-16 ENCOUNTER — Emergency Department (HOSPITAL_COMMUNITY): Payer: MEDICAID

## 2023-10-16 ENCOUNTER — Encounter (HOSPITAL_COMMUNITY): Payer: Self-pay | Admitting: *Deleted

## 2023-10-16 ENCOUNTER — Telehealth (INDEPENDENT_AMBULATORY_CARE_PROVIDER_SITE_OTHER): Payer: Self-pay | Admitting: Primary Care

## 2023-10-16 ENCOUNTER — Emergency Department (HOSPITAL_COMMUNITY)
Admission: EM | Admit: 2023-10-16 | Discharge: 2023-10-17 | Disposition: A | Discharge status: Home / Self Care | Payer: MEDICAID | Attending: Emergency Medicine | Admitting: Emergency Medicine

## 2023-10-16 DIAGNOSIS — R519 Headache, unspecified: Secondary | ICD-10-CM | POA: Insufficient documentation

## 2023-10-16 DIAGNOSIS — I10 Essential (primary) hypertension: Secondary | ICD-10-CM | POA: Diagnosis not present

## 2023-10-16 DIAGNOSIS — F1090 Alcohol use, unspecified, uncomplicated: Secondary | ICD-10-CM | POA: Diagnosis not present

## 2023-10-16 LAB — BASIC METABOLIC PANEL WITH GFR
Anion gap: 13 (ref 5–15)
BUN: 16 mg/dL (ref 8–23)
CO2: 22 mmol/L (ref 22–32)
Calcium: 9 mg/dL (ref 8.9–10.3)
Chloride: 105 mmol/L (ref 98–111)
Creatinine, Ser: 1.18 mg/dL (ref 0.61–1.24)
GFR, Estimated: >60 mL/min (ref >60)
Glucose, Bld: 119 mg/dL — ABNORMAL HIGH (ref 70–99)
Potassium: 3.3 mmol/L — ABNORMAL LOW (ref 3.5–5.1)
Sodium: 140 mmol/L (ref 135–145)

## 2023-10-16 LAB — CBC
HCT: 38.8 % — ABNORMAL LOW (ref 39.0–52.0)
Hemoglobin: 13 g/dL (ref 13.0–17.0)
MCH: 31.8 pg (ref 26.0–34.0)
MCHC: 33.5 g/dL (ref 30.0–36.0)
MCV: 94.9 fL (ref 80.0–100.0)
Platelets: 190 10*3/uL (ref 150–400)
RBC: 4.09 MIL/uL — ABNORMAL LOW (ref 4.22–5.81)
RDW: 12.7 % (ref 11.5–15.5)
WBC: 8.8 10*3/uL (ref 4.0–10.5)
nRBC: 0 % (ref 0.0–0.2)

## 2023-10-16 MED ORDER — ONDANSETRON HCL 4 MG/2ML IJ SOLN
4.0000 mg | Freq: Once | INTRAMUSCULAR | Status: AC
  Administered 2023-10-17: 4 mg via INTRAVENOUS
  Filled 2023-10-16: qty 2

## 2023-10-16 MED ORDER — SODIUM CHLORIDE 0.9 % IV BOLUS
250.0000 mL | Freq: Once | INTRAVENOUS | Status: AC
  Administered 2023-10-17: 250 mL via INTRAVENOUS

## 2023-10-16 MED ORDER — MAGNESIUM SULFATE IN D5W 1-5 GM/100ML-% IV SOLN
1.0000 g | Freq: Once | INTRAVENOUS | Status: AC
  Administered 2023-10-17: 1 g via INTRAVENOUS
  Filled 2023-10-16: qty 100

## 2023-10-16 MED ORDER — KETOROLAC TROMETHAMINE 30 MG/ML IJ SOLN
30.0000 mg | Freq: Once | INTRAMUSCULAR | Status: AC
  Administered 2023-10-17: 30 mg via INTRAVENOUS
  Filled 2023-10-16: qty 1

## 2023-10-16 NOTE — ED Provider Triage Note (Signed)
 Emergency Medicine Provider Triage Evaluation Note  [name] , a 63 y.o. male  was evaluated in triage.  Pt complains of headache.  Started Sunday and has been persistent.  Headache pain is left-sided.  Endorses phonophobia and photophobia as well.  Denies visual changes.  Also states he has been intermittently dizzy.  He states that he if he bends over he feels very lightheaded and woozy.  Improved if he is standing up or sitting down.  Denies chest pain and shortness of breath.  Denies fever.  Review of Systems  Positive: See above Negative: See above  Physical Exam  BP (!) 147/92 (BP Location: Left Arm)   Pulse 83   Temp 98.4 F (36.9 C)   Resp 16   Ht 6' (1.829 m)   Wt 117.9 kg   SpO2 100%   BMI 35.25 kg/m  Gen:   Awake, no distress   Resp:  Normal effort  MSK:   Moves extremities without difficulty  Other:    Medical Decision Making  Medically screening exam initiated at 6:16 PM.  Appropriate orders placed.  Kendal Raffo was informed that the remainder of the evaluation will be completed by another provider, this initial triage assessment does not replace that evaluation, and the importance of remaining in the ED until their evaluation is complete.  Work up started   Janalee Mcmurray, PA-C 10/16/23 1818

## 2023-10-16 NOTE — ED Triage Notes (Signed)
 Headache and dizziness since Sunday  hx of the same lt sided head pain

## 2023-10-16 NOTE — ED Provider Notes (Signed)
 Emergency Department Provider Note   I have reviewed the triage vital signs and the nursing notes.   HISTORY  Chief Complaint Headache and Dizziness   HPI [name] is a 63 y.o. male with past history reviewed below presents to the emergency department for evaluation of return of his headache symptoms.  He describes a left-sided headache present for the past 3 days.  He has some photophobia and occasional blurry vision but nothing consistent.  He feels lightheaded and generally weak but no unilateral weakness or numbness.  No sudden onset, maximal intensity headache symptoms.  No fevers.  He did see a neurologist in 2022 and was prescribed several medications but his headaches have not returned until 3 days ago.    Past Medical History:  Diagnosis Date   Alcohol abuse    Allergy    Anxiety    Arthritis    Diverticulosis of colon (without mention of hemorrhage)    GERD (gastroesophageal reflux disease)    Gout    History of kidney stones    Hyperlipemia    Hypertension    dr Athena Bland  norins   Kidney stones    Nephrolithiasis    Pancreatitis    Hx of    Ureteral stent displacement (HCC) 1990    Review of Systems  Constitutional: No fever/chills Cardiovascular: Denies chest pain. Respiratory: Denies shortness of breath. Gastrointestinal: No abdominal pain.  No nausea, no vomiting.  Musculoskeletal: Negative for back pain. Skin: Negative for rash. Neurological: Positive HA. No unilateral weakness/numbness.   ____________________________________________   PHYSICAL EXAM:  VITAL SIGNS: ED Triage Vitals  Encounter Vitals Group     BP 10/16/23 1731 (!) 147/92     Pulse Rate 10/16/23 1731 83     Resp 10/16/23 1731 16     Temp 10/16/23 1731 98.4 F (36.9 C)     Temp Source 10/16/23 2344 Oral     SpO2 10/16/23 1731 100 %     Weight 10/16/23 1759 259 lb 14.8 oz (117.9 kg)     Height 10/16/23 1759 6' (1.829 m)   Constitutional: Alert and oriented. Well appearing  and in no acute distress. Eyes: Conjunctivae are normal.  Head: Atraumatic.  No temporal artery tenderness. Nose: No congestion/rhinnorhea. Mouth/Throat: Mucous membranes are moist.   Neck: No stridor.   Cardiovascular: Normal rate, regular rhythm. Good peripheral circulation. Grossly normal heart sounds.   Respiratory: Normal respiratory effort.  No retractions. Lungs CTAB. Gastrointestinal: Soft and nontender. No distention.  Musculoskeletal: No lower extremity tenderness nor edema. No gross deformities of extremities. Neurologic:  Normal speech and language.  No facial asymmetry.  5/5 strength in the bilateral upper and lower extremities with normal sensation. Skin:  Skin is warm, dry and intact. No rash noted.  ____________________________________________   LABS (all labs ordered are listed, but only abnormal results are displayed)  Labs Reviewed  CBC - Abnormal; Notable for the following components:      Result Value   RBC 4.09 (*)    HCT 38.8 (*)    All other components within normal limits  BASIC METABOLIC PANEL WITH GFR - Abnormal; Notable for the following components:   Potassium 3.3 (*)    Glucose, Bld 119 (*)    All other components within normal limits   ____________________________________________  RADIOLOGY  CT Head Wo Contrast Result Date: 10/16/2023 CLINICAL DATA:  Increasing headaches EXAM: CT HEAD WITHOUT CONTRAST TECHNIQUE: Contiguous axial images were obtained from the base of the skull through  the vertex without intravenous contrast. RADIATION DOSE REDUCTION: This exam was performed according to the departmental dose-optimization program which includes automated exposure control, adjustment of the mA and/or kV according to patient size and/or use of iterative reconstruction technique. COMPARISON:  04/03/2020 FINDINGS: Brain: No evidence of acute infarction, hemorrhage, hydrocephalus, extra-axial collection or mass lesion/mass effect. Mild atrophic changes are  seen. Vascular: No hyperdense vessel or unexpected calcification. Skull: Normal. Negative for fracture or focal lesion. Sinuses/Orbits: No acute finding. Other: None. IMPRESSION: Mild atrophic changes without acute abnormality. Electronically Signed   By: Violeta Grey M.D.   On: 10/16/2023 19:09    ____________________________________________   PROCEDURES  Procedure(s) performed:   Procedures  None  ____________________________________________   INITIAL IMPRESSION / ASSESSMENT AND PLAN / ED COURSE  Pertinent labs & imaging results that were available during my care of the patient were reviewed by me and considered in my medical decision making (see chart for details).   This patient is Presenting for Evaluation of HA, which does require a range of treatment options, and is a complaint that involves a high risk of morbidity and mortality.  The Differential Diagnoses includes but is not exclusive to subarachnoid hemorrhage, meningitis, encephalitis, previous head trauma, cavernous venous thrombosis, muscle tension headache, glaucoma, temporal arteritis, migraine or migraine equivalent, etc.   Critical Interventions-    Medications  sodium chloride  0.9 % bolus 250 mL (0 mLs Intravenous Stopped 10/17/23 0249)  ketorolac  (TORADOL ) 30 MG/ML injection 30 mg (30 mg Intravenous Given 10/17/23 0012)  magnesium  sulfate IVPB 1 g 100 mL (0 g Intravenous Stopped 10/17/23 0150)  ondansetron  (ZOFRAN ) injection 4 mg (4 mg Intravenous Given 10/17/23 0012)    Reassessment after intervention: pain improved.   Clinical Laboratory Tests Ordered, included CBC without leukocytosis or anemia.  No acute kidney injury.  Radiologic Tests Ordered, included CT head. I independently interpreted the images and agree with radiology interpretation.   Cardiac Monitor Tracing which shows NSR.    Social Determinants of Health Risk patient is a non-smoker.   Medical Decision Making: Summary:  Patient presents  emergency department with headache.  No specific red flag signs or symptoms on my assessment.  CT head and labs ordered from triage and are reassuring.  Plan for headache pain management and reassess.  He may benefit from reestablishing care with neurology given return of headaches.   Reevaluation with update and discussion with patient. HA symptoms improved. Stable for discharge. Librium  given with patient reporting return to heavy drinking and plan to stop. Would like info on area resources at discharge. Discussed dealing with EtOH issue first and if HA continues/returns can discuss with PCP regarding repeat referral to Neurology at that time.   Patient's presentation is most consistent with acute presentation with potential threat to life or bodily function.   Disposition: discharge  ____________________________________________  FINAL CLINICAL IMPRESSION(S) / ED DIAGNOSES  Final diagnoses:  Bad headache  Alcohol use     NEW OUTPATIENT MEDICATIONS STARTED DURING THIS VISIT:  Discharge Medication List as of 10/17/2023  2:49 AM     START taking these medications   Details  chlordiazePOXIDE  (LIBRIUM ) 25 MG capsule 50mg  PO TID x 1D, then 25-50mg  PO BID X 1D, then 25-50mg  PO QD X 1D, Normal        Note:  This document was prepared using Dragon voice recognition software and may include unintentional dictation errors.  Abby Hocking, MD, [facility]    Illana Nolting, Shereen Dike, MD 10/18/23 [phone]

## 2023-10-16 NOTE — Telephone Encounter (Signed)
 Called pt to confirm appt. Pt will be present.

## 2023-10-17 ENCOUNTER — Ambulatory Visit (INDEPENDENT_AMBULATORY_CARE_PROVIDER_SITE_OTHER): Payer: MEDICAID | Admitting: Primary Care

## 2023-10-17 MED ORDER — NURTEC 75 MG PO TBDP: 75.0000 mg | ORAL_TABLET | Freq: Every day | ORAL | 0 refills | Status: DC | PRN

## 2023-10-17 MED ORDER — CHLORDIAZEPOXIDE HCL 25 MG PO CAPS: ORAL_CAPSULE | ORAL | 0 refills | Status: DC

## 2023-10-17 NOTE — Discharge Instructions (Signed)

## 2023-10-29 ENCOUNTER — Encounter (INDEPENDENT_AMBULATORY_CARE_PROVIDER_SITE_OTHER): Payer: Self-pay | Admitting: Primary Care

## 2023-10-29 ENCOUNTER — Ambulatory Visit (INDEPENDENT_AMBULATORY_CARE_PROVIDER_SITE_OTHER): Payer: MEDICAID | Admitting: Primary Care

## 2023-10-29 VITALS — BP 138/78 | Resp 16 | Ht 72.0 in | Wt 265.8 lb

## 2023-10-29 DIAGNOSIS — Z09 Encounter for follow-up examination after completed treatment for conditions other than malignant neoplasm: Secondary | ICD-10-CM

## 2023-10-29 DIAGNOSIS — I10 Essential (primary) hypertension: Secondary | ICD-10-CM | POA: Diagnosis not present

## 2023-10-29 NOTE — Progress Notes (Signed)
 Subjective:   [name] is a 63 y.o. male presents for hospital follow . Admit date to the hospital was 10/16/23, patient was discharged from the hospital on 10/17/23, patient was admitted for: Headache and Dizziness. Hx of left-sided headache he does drink alcohol and chocolate frequently. Endorse  photophobia and  blurry vision but nothing consistently.   Past Medical History:  Diagnosis Date   Alcohol abuse    Allergy    Anxiety    Arthritis    Diverticulosis of colon (without mention of hemorrhage)    GERD (gastroesophageal reflux disease)    Gout    History of kidney stones    Hyperlipemia    Hypertension    dr garrel  norins   Kidney stones    Nephrolithiasis    Pancreatitis    Hx of    Ureteral stent displacement (HCC) 1990     Allergies  Allergen Reactions   Hydrocodone -Acetaminophen  Palpitations   Shrimp [Shellfish Allergy] Other (See Comments)    Cholesterol level becomes very high. Patient reports sweating and itching.    Current Outpatient Medications on File Prior to Visit  Medication Sig Dispense Refill   chlordiazePOXIDE  (LIBRIUM ) 25 MG capsule 50mg  PO TID x 1D, then 25-50mg  PO BID X 1D, then 25-50mg  PO QD X 1D (Patient not taking: Reported on 11/05/2023) 10 capsule 0   cyclobenzaprine  (FLEXERIL ) 10 MG tablet Take 1 tablet by mouth three times daily as needed for muscle spasm 60 tablet 1   Melatonin 10 MG CAPS Take 10 mg by mouth at bedtime as needed. 90 capsule 1   Multiple Vitamins-Minerals (MULTIVITAMIN WITH MINERALS) tablet Take 1 tablet by mouth daily.     omeprazole  (PRILOSEC) 40 MG capsule Take 1 capsule (40 mg total) by mouth daily. 90 capsule 1   oxyCODONE -acetaminophen  (PERCOCET/ROXICET) 5-325 MG tablet Take 1 tablet by mouth every 6 (six) hours as needed for severe pain (pain score 7-10). 10 tablet 0   potassium chloride  SA (KLOR-CON  M) 20 MEQ tablet Take 1 tablet (20 mEq total) by mouth daily. 14 tablet 0   Rimegepant Sulfate (NURTEC) 75 MG TBDP Take  1 tablet (75 mg total) by mouth daily as needed (migraine headache). (Patient not taking: Reported on 11/05/2023) 30 tablet 0   rizatriptan  (MAXALT -MLT) 10 MG disintegrating tablet Take 1 tablet (10 mg total) by mouth as needed for migraine. May repeat in 2 hours if needed (Patient not taking: Reported on 11/05/2023) 9 tablet 11   Tetrahydrozoline HCl (VISINE OP) Place 1 drop into both eyes daily as needed (redness).     triamcinolone  cream (KENALOG ) 0.1 % Apply 1 application topically 2 (two) times daily. (Patient not taking: Reported on 11/05/2023) 60 g 0   [DISCONTINUED] diphenhydrAMINE  (BENADRYL ) 25 MG tablet Take 1 tablet (25 mg total) by mouth every 6 (six) hours as needed. 30 tablet 0   [DISCONTINUED] metoCLOPramide  (REGLAN ) 10 MG tablet Take 1 tablet (10 mg total) by mouth every 6 (six) hours as needed for nausea (nausea/headache). Take this tablet with 25 mg of Benadryl  and two extra strength Tylenol  for headache. 12 tablet 0   No current facility-administered medications on file prior to visit.    Review of System: ROS Comprehensive ROS Pertinent positive and negative noted in HPI   Objective:  BP 138/78 (Cuff Size: Large)   Resp 16   Ht 6' (1.829 m)   Wt 265 lb 12.8 oz (120.6 kg)   BMI 36.05 kg/m    Physical Exam  Vitals reviewed.  Constitutional:      Appearance: He is obese.  HENT:     Head: Normocephalic.     Right Ear: Tympanic membrane and external ear normal.     Left Ear: Tympanic membrane and external ear normal.     Nose: Nose normal.  Eyes:     Extraocular Movements: Extraocular movements intact.     Pupils: Pupils are equal, round, and reactive to light.  Cardiovascular:     Rate and Rhythm: Normal rate and regular rhythm.  Pulmonary:     Effort: Pulmonary effort is normal.     Breath sounds: Normal breath sounds.  Abdominal:     General: Bowel sounds are normal. There is distension.     Palpations: Abdomen is soft.  Musculoskeletal:        General: Normal  range of motion.  Skin:    General: Skin is warm and dry.  Neurological:     Mental Status: He is oriented to person, place, and time.  Psychiatric:        Mood and Affect: Mood normal.        Behavior: Behavior normal.        Thought Content: Thought content normal.        Judgment: Judgment normal.      Assessment:  [name] was seen today for hypertension.  Diagnoses and all orders for this visit:  Hospital discharge follow-up  Essential hypertension BP goal - < 140/90 at goal DIET: Limit salt intake, read nutrition labels to check salt content, limit fried and high fatty foods  Avoid using multisymptom OTC cold preparations that generally contain sudafed which can rise BP. Consult with pharmacist on best cold relief products to use for persons with HTN EXERCISE Discussed incorporating exercise such as walking - 30 minutes most days of the week and can do in 10 minute intervals     Followed by cardiology  This note has been created with Dragon speech recognition software and Paediatric nurse. Any transcriptional errors are unintentional.  As needed  Rosaline SHAUNNA Bohr, NP 11/11/2023, 2:33 PM

## 2023-11-05 ENCOUNTER — Encounter: Payer: Self-pay | Admitting: Cardiovascular Disease

## 2023-11-05 ENCOUNTER — Ambulatory Visit: Payer: Self-pay

## 2023-11-05 ENCOUNTER — Ambulatory Visit: Payer: MEDICAID | Attending: Cardiology | Admitting: Cardiovascular Disease

## 2023-11-05 VITALS — BP 140/90 | HR 80 | Ht 72.0 in | Wt 265.0 lb

## 2023-11-05 DIAGNOSIS — R079 Chest pain, unspecified: Secondary | ICD-10-CM

## 2023-11-05 DIAGNOSIS — E785 Hyperlipidemia, unspecified: Secondary | ICD-10-CM | POA: Diagnosis not present

## 2023-11-05 DIAGNOSIS — I10 Essential (primary) hypertension: Secondary | ICD-10-CM

## 2023-11-05 DIAGNOSIS — E781 Pure hyperglyceridemia: Secondary | ICD-10-CM | POA: Diagnosis present

## 2023-11-05 DIAGNOSIS — F1010 Alcohol abuse, uncomplicated: Secondary | ICD-10-CM

## 2023-11-05 DIAGNOSIS — Z760 Encounter for issue of repeat prescription: Secondary | ICD-10-CM | POA: Diagnosis not present

## 2023-11-05 MED ORDER — AMLODIPINE BESYLATE 10 MG PO TABS: 10.0000 mg | ORAL_TABLET | Freq: Every day | ORAL | 3 refills | Status: AC

## 2023-11-05 MED ORDER — ATORVASTATIN CALCIUM 40 MG PO TABS: 40.0000 mg | ORAL_TABLET | Freq: Every day | ORAL | 3 refills | Status: AC

## 2023-11-05 MED ORDER — CARVEDILOL 25 MG PO TABS: 25.0000 mg | ORAL_TABLET | Freq: Two times a day (BID) | ORAL | 3 refills | Status: AC

## 2023-11-05 MED ORDER — LOSARTAN POTASSIUM 25 MG PO TABS: 25.0000 mg | ORAL_TABLET | Freq: Every day | ORAL | 3 refills | Status: AC

## 2023-11-05 NOTE — Telephone Encounter (Signed)
          Copied from CRM [id number]. Topic: Clinical - Red Word Triage >> Nov 05, 2023  9:26 AM Myrick T wrote: Red Word that prompted transfer to Nurse Triage: patient having bad headaches with pain at the temple on left side. He has been dizzy and nauseous Reason for Disposition  [1] SEVERE headache AND [2] sudden-onset (i.e., reaching maximum intensity within seconds to 1 hour)  Answer Assessment - Initial Assessment Questions 1. LOCATION: Where does it hurt?      Left temple  2. ONSET: When did the headache start? (Minutes, hours or days)      Yesterday pm  3. PATTERN: Does the pain come and go, or has it been constant since it started?     constant 4. SEVERITY: How bad is the pain? and What does it keep you from doing?  (e.g., Scale 1-10; mild, moderate, or severe)   - MILD (1-3): doesn't interfere with normal activities    - MODERATE (4-7): interferes with normal activities or awakens from sleep    - SEVERE (8-10): excruciating pain, unable to do any normal activities        severe 5. RECURRENT SYMPTOM: Have you ever had headaches before? If Yes, ask: When was the last time? and What happened that time?      yes 6. CAUSE: What do you think is causing the headache?     unsure 7. MIGRAINE: Have you been diagnosed with migraine headaches? If Yes, ask: Is this headache similar?     Yes  8. HEAD INJURY: Has there been any recent injury to the head?      no 9. OTHER SYMPTOMS: Do you have any other symptoms? (fever, stiff neck, eye pain, sore throat, cold symptoms)     Dizzy and nauseous, face left side cheek tender, numbness to left face, worse when bending- no drooping  Protocols used: Headache-A-AH

## 2023-11-05 NOTE — Assessment & Plan Note (Signed)
 BMI 36.  Referral to [facility] for doctor assisted weight loss.

## 2023-11-05 NOTE — Assessment & Plan Note (Signed)
 Currently drinks a sixpack of beer a day.  We talked about the importance of moderation.

## 2023-11-05 NOTE — Assessment & Plan Note (Signed)
 History of hyperlipidemia on statin therapy with lipid profile performed 06/25/2022 revealing total cholesterol 123, LDL 119 and HDL 67.

## 2023-11-05 NOTE — Patient Instructions (Addendum)
 Medication Instructions:  Your physician recommends that you continue on your current medications as directed. Please refer to the Current Medication list given to you today.  *If you need a refill on your cardiac medications before your next appointment, please call your pharmacy*  Lab Work: If you have labs (blood work) drawn today and your tests are completely normal, you will receive your results only by: MyChart Message (if you have MyChart) OR A paper copy in the mail If you have any lab test that is abnormal or we need to change your treatment, we will call you to review the results.  Testing/Procedures: None ordered today.  Follow-Up: At [facility], you and your health needs are our priority.  As part of our continuing mission to provide you with exceptional heart care, our providers are all part of one team.  This team includes your primary Cardiologist (physician) and Advanced Practice Providers or APPs (Physician Assistants and Nurse Practitioners) who all work together to provide you with the care you need, when you need it.  Your next appointment:   1 year(s)  Provider:   Josefa Beauvais, NP      Then, Dorn Lesches, MD will plan to see you again in 2 year(s).    We recommend signing up for the patient portal called MyChart.  Sign up information is provided on this After Visit Summary.  MyChart is used to connect with patients for Virtual Visits (Telemedicine).  Patients are able to view lab/test results, encounter notes, upcoming appointments, etc.  Non-urgent messages can be sent to your provider as well.   To learn more about what you can do with MyChart, go to [email or web address].   Other Instructions    [facility] been [facility] to [facility]

## 2023-11-05 NOTE — Assessment & Plan Note (Signed)
 History of chest pain in the past with a cardiac catheterization performed by myself 08/10/2019 that revealed nonobstructive CAD.

## 2023-11-05 NOTE — Progress Notes (Signed)
 11/05/2023 [name]   01-Jan-1961  [id number]  Primary Physician [name], NP Primary Cardiologist: Dorn JINNY Lesches MD [facility] [place] [place], [place]  HPI:  [name] is a 63 y.o.  moderately overweight separated African-American male father of 3, grandfather of 3 grandchildren who is currently disabled from being a Tax inspector at [employer] .    I last saw him in the office 02/28/2021.  He had back issues and back surgery.  He is referred by Rosaline Celestia, NP for evaluation of chest pain.  His risk factors include remote tobacco abuse, treated hypertension and hyperlipidemia as well as family history with a brother who had stents.  He is never had a heart tach or stroke.  He does not admit to drinking excessive alcohol.  He was recently started on pravastatin  by his PCP.  He has new onset chest pain over the last several weeks occurring several times a week with some characteristics of GERD and some atypical characteristics as well.  These are also associated with palpitations.   I performed a coronary CTA on him revealing proximal LAD lesion on 07/30/2019 that is led to outpatient diagnostic coronary angiogram via the right radial approach 08/10/2019 that revealed noncritical CAD with most 40% proximal LAD lesion thought not to be physiologically significant.     Since I saw him in the office 3 years ago he continues to do well.  He has not lost any weight unfortunately.  He still drinks a sixpack of beer a night.  He remains disabled, lives with his mother who he takes care of.  He denies chest pain or shortness of breath.  He really does not get out much.   Current Meds  Medication Sig   cyclobenzaprine  (FLEXERIL ) 10 MG tablet Take 1 tablet by mouth three times daily as needed for muscle spasm   Melatonin 10 MG CAPS Take 10 mg by mouth at bedtime as needed.   Multiple Vitamins-Minerals (MULTIVITAMIN WITH MINERALS) tablet Take 1 tablet by mouth daily.   omeprazole  (PRILOSEC) 40  MG capsule Take 1 capsule (40 mg total) by mouth daily.   oxyCODONE -acetaminophen  (PERCOCET/ROXICET) 5-325 MG tablet Take 1 tablet by mouth every 6 (six) hours as needed for severe pain (pain score 7-10).   potassium chloride  SA (KLOR-CON  M) 20 MEQ tablet Take 1 tablet (20 mEq total) by mouth daily.   Tetrahydrozoline HCl (VISINE OP) Place 1 drop into both eyes daily as needed (redness).   [DISCONTINUED] amLODipine  (NORVASC ) 10 MG tablet Take 1 tablet by mouth once daily   [DISCONTINUED] atorvastatin  (LIPITOR) 40 MG tablet Take 1 tablet (40 mg total) by mouth daily.   [DISCONTINUED] carvedilol  (COREG ) 25 MG tablet Take 1 tablet (25 mg total) by mouth 2 (two) times daily with a meal.   [DISCONTINUED] losartan  (COZAAR ) 25 MG tablet Take 1 tablet (25 mg total) by mouth daily.     Allergies  Allergen Reactions   Hydrocodone -Acetaminophen  Palpitations   Shrimp [Shellfish Allergy] Other (See Comments)    Cholesterol level becomes very high. Patient reports sweating and itching.    Social History   Socioeconomic History   Marital status: Legally Separated    Spouse name: Not on file   Number of children: 3   Years of education: Not on file   Highest education level: Not on file  Occupational History    Employer: [employer]    Comment: Pharmacologist , retired  Tobacco Use  Smoking status: Former    Current packs/day: 0.00    Types: Cigarettes    Quit date: 07/10/1998    Years since quitting: 25.3   Smokeless tobacco: Never  Vaping Use   Vaping status: Never Used  Substance and Sexual Activity   Alcohol use: Yes    Alcohol/week: 6.0 standard drinks of alcohol    Types: 6 Cans of beer per week    Comment: a couple of beers a day   Drug use: No   Sexual activity: Yes  Other Topics Concern   Not on file  Social History Narrative   HSG   [employer] groundskeeper   Difficult domestic situation, Married '87- separated '01   2 sons- '82, '91; 1 daughter '91   His niece is 90 with  metastatic breast cancer (feb '12)   Social Drivers of Corporate investment banker Strain: Not on file  Food Insecurity: No Food Insecurity (10/29/2023)   Hunger Vital Sign    Worried About Running Out of Food in the Last Year: Never true    Ran Out of Food in the Last Year: Never true  Transportation Needs: No Transportation Needs (10/29/2023)   PRAPARE - Administrator, Civil Service (Medical): No    Lack of Transportation (Non-Medical): No  Physical Activity: Not on file  Stress: Not on file  Social Connections: Not on file  Intimate Partner Violence: Not At Risk (10/29/2023)   Humiliation, Afraid, Rape, and Kick questionnaire    Fear of Current or Ex-Partner: No    Emotionally Abused: No    Physically Abused: No    Sexually Abused: No     Review of Systems: General: negative for chills, fever, night sweats or weight changes.  Cardiovascular: negative for chest pain, dyspnea on exertion, edema, orthopnea, palpitations, paroxysmal nocturnal dyspnea or shortness of breath Dermatological: negative for rash Respiratory: negative for cough or wheezing Urologic: negative for hematuria Abdominal: negative for nausea, vomiting, diarrhea, bright red blood per rectum, melena, or hematemesis Neurologic: negative for visual changes, syncope, or dizziness All other systems reviewed and are otherwise negative except as noted above.    Blood pressure (!) 140/90, pulse 80, height 6' (1.829 m), weight 265 lb (120.2 kg), SpO2 96%.  General appearance: alert and no distress Neck: no adenopathy, no carotid bruit, no JVD, supple, symmetrical, trachea midline, and thyroid  not enlarged, symmetric, no tenderness/mass/nodules Lungs: clear to auscultation bilaterally Heart: regular rate and rhythm, S1, S2 normal, no murmur, click, rub or gallop Extremities: extremities normal, atraumatic, no cyanosis or edema Pulses: 2+ and symmetric Skin: Skin color, texture, turgor normal. No rashes or  lesions Neurologic: Grossly normal  EKG EKG Interpretation Date/Time:  Tuesday November 05 2023 13:30:41 EDT Ventricular Rate:  80 PR Interval:  166 QRS Duration:  90 QT Interval:  402 QTC Calculation: 463 R Axis:   -16  Text Interpretation: Normal sinus rhythm Nonspecific T wave abnormality Prolonged QT When compared with ECG of 14-Sep-2021 15:08, No significant change was found Confirmed by Court Carrier [phone]) on 11/05/2023 1:34:12 PM    ASSESSMENT AND PLAN:   Alcohol abuse Currently drinks a sixpack of beer a day.  We talked about the importance of moderation.  Hyperlipidemia History of hyperlipidemia on statin therapy with lipid profile performed 06/25/2022 revealing total cholesterol 123, LDL 119 and HDL 67.  Chest pain of uncertain etiology History of chest pain in the past with a cardiac catheterization performed by myself 08/10/2019 that revealed nonobstructive CAD.  Morbid obesity (HCC) BMI 36.  Referral to [facility] center for doctor assisted weight loss.  Essential hypertension History of essential hypertension with blood pressure measured today at 140/90.  He is on amlodipine , carvedilol  and losartan .     Dorn DOROTHA Lesches MD FACP,FACC,FAHA, [facility] 11/05/2023 1:53 PM

## 2023-11-05 NOTE — Telephone Encounter (Signed)
 FYI Only or Action Required?: FYI only for provider.  Patient was last seen in primary care on 10/29/2023 by [name], NP. Called Nurse Triage reporting Headache, numbness to left face, dizziness and nausea. Symptoms began yesterday. Interventions attempted: OTC medications: Excedrin and ice pack to neck. Symptoms are: gradually worsening.  Triage Disposition: Go to ED Now (overriding Go to ED or PCP/Alternative with Approval)  Patient/caregiver understands and will follow disposition?: Yes

## 2023-11-05 NOTE — Assessment & Plan Note (Signed)
 History of essential hypertension with blood pressure measured today at 140/90.  He is on amlodipine , carvedilol  and losartan .

## 2023-11-06 ENCOUNTER — Other Ambulatory Visit (INDEPENDENT_AMBULATORY_CARE_PROVIDER_SITE_OTHER): Payer: Self-pay | Admitting: Primary Care

## 2023-11-06 NOTE — Telephone Encounter (Signed)
 Copied from CRM [id number]. Topic: Clinical - Medication Refill >> Nov 06, 2023  4:34 PM Everette C wrote: Medication: Rimegepant Sulfate (NURTEC) 75 MG TBDP [[id number]]  Has the patient contacted their pharmacy? Yes (Agent: If no, request that the patient contact the pharmacy for the refill. If patient does not wish to contact the pharmacy document the reason why and proceed with request.) (Agent: If yes, when and what did the pharmacy advise?)  This is the patient's preferred pharmacy:  [facility] [address] [place], [place] - [address] [place] [address] Phone: [phone] Fax: [phone]  Is this the correct pharmacy for this prescription? Yes If no, delete pharmacy and type the correct one.   Has the prescription been filled recently? Yes  Is the patient out of the medication? Yes  Has the patient been seen for an appointment in the last year OR does the patient have an upcoming appointment? Yes  Can we respond through MyChart? No  Agent: Please be advised that Rx refills may take up to 3 business days. We ask that you follow-up with your pharmacy.

## 2023-11-11 ENCOUNTER — Encounter (INDEPENDENT_AMBULATORY_CARE_PROVIDER_SITE_OTHER): Payer: Self-pay | Admitting: Primary Care

## 2023-11-11 ENCOUNTER — Inpatient Hospital Stay (HOSPITAL_COMMUNITY)
Admission: EM | Admit: 2023-11-11 | Discharge: 2023-11-20 | DRG: 439 | Disposition: A | Discharge status: Home / Self Care | Payer: MEDICAID | Attending: Internal Medicine | Admitting: Internal Medicine

## 2023-11-11 ENCOUNTER — Other Ambulatory Visit: Payer: Self-pay

## 2023-11-11 ENCOUNTER — Encounter (HOSPITAL_COMMUNITY): Payer: Self-pay | Admitting: Emergency Medicine

## 2023-11-11 DIAGNOSIS — E785 Hyperlipidemia, unspecified: Secondary | ICD-10-CM | POA: Diagnosis present

## 2023-11-11 DIAGNOSIS — K921 Melena: Secondary | ICD-10-CM | POA: Diagnosis present

## 2023-11-11 DIAGNOSIS — I10 Essential (primary) hypertension: Secondary | ICD-10-CM | POA: Diagnosis present

## 2023-11-11 DIAGNOSIS — Z8249 Family history of ischemic heart disease and other diseases of the circulatory system: Secondary | ICD-10-CM

## 2023-11-11 DIAGNOSIS — Z833 Family history of diabetes mellitus: Secondary | ICD-10-CM

## 2023-11-11 DIAGNOSIS — E66811 Obesity, class 1: Secondary | ICD-10-CM | POA: Diagnosis present

## 2023-11-11 DIAGNOSIS — Z6838 Body mass index (BMI) 38.0-38.9, adult: Secondary | ICD-10-CM

## 2023-11-11 DIAGNOSIS — E860 Dehydration: Secondary | ICD-10-CM | POA: Diagnosis present

## 2023-11-11 DIAGNOSIS — K567 Ileus, unspecified: Secondary | ICD-10-CM | POA: Diagnosis not present

## 2023-11-11 DIAGNOSIS — R109 Unspecified abdominal pain: Secondary | ICD-10-CM | POA: Diagnosis not present

## 2023-11-11 DIAGNOSIS — Z87891 Personal history of nicotine dependence: Secondary | ICD-10-CM

## 2023-11-11 DIAGNOSIS — Z87442 Personal history of urinary calculi: Secondary | ICD-10-CM

## 2023-11-11 DIAGNOSIS — K2920 Alcoholic gastritis without bleeding: Secondary | ICD-10-CM | POA: Diagnosis present

## 2023-11-11 DIAGNOSIS — K852 Alcohol induced acute pancreatitis without necrosis or infection: Secondary | ICD-10-CM | POA: Diagnosis not present

## 2023-11-11 DIAGNOSIS — E872 Acidosis, unspecified: Secondary | ICD-10-CM | POA: Diagnosis present

## 2023-11-11 DIAGNOSIS — N281 Cyst of kidney, acquired: Secondary | ICD-10-CM | POA: Diagnosis present

## 2023-11-11 DIAGNOSIS — F1020 Alcohol dependence, uncomplicated: Secondary | ICD-10-CM | POA: Diagnosis present

## 2023-11-11 DIAGNOSIS — R195 Other fecal abnormalities: Secondary | ICD-10-CM | POA: Diagnosis present

## 2023-11-11 DIAGNOSIS — I2510 Atherosclerotic heart disease of native coronary artery without angina pectoris: Secondary | ICD-10-CM | POA: Diagnosis present

## 2023-11-11 DIAGNOSIS — K219 Gastro-esophageal reflux disease without esophagitis: Secondary | ICD-10-CM | POA: Diagnosis present

## 2023-11-11 DIAGNOSIS — Z885 Allergy status to narcotic agent status: Secondary | ICD-10-CM

## 2023-11-11 DIAGNOSIS — Z79899 Other long term (current) drug therapy: Secondary | ICD-10-CM

## 2023-11-11 DIAGNOSIS — E876 Hypokalemia: Secondary | ICD-10-CM | POA: Diagnosis present

## 2023-11-11 DIAGNOSIS — I700 Atherosclerosis of aorta: Secondary | ICD-10-CM | POA: Diagnosis present

## 2023-11-11 DIAGNOSIS — K2980 Duodenitis without bleeding: Secondary | ICD-10-CM | POA: Diagnosis present

## 2023-11-11 DIAGNOSIS — R3129 Other microscopic hematuria: Secondary | ICD-10-CM | POA: Diagnosis present

## 2023-11-11 DIAGNOSIS — M109 Gout, unspecified: Secondary | ICD-10-CM | POA: Diagnosis present

## 2023-11-11 DIAGNOSIS — K760 Fatty (change of) liver, not elsewhere classified: Secondary | ICD-10-CM | POA: Diagnosis present

## 2023-11-11 DIAGNOSIS — R1010 Upper abdominal pain, unspecified: Secondary | ICD-10-CM

## 2023-11-11 DIAGNOSIS — Z91013 Allergy to seafood: Secondary | ICD-10-CM

## 2023-11-11 HISTORY — DX: Other psychoactive substance abuse, uncomplicated: F19.10

## 2023-11-11 HISTORY — DX: Sleep apnea, unspecified: G47.30

## 2023-11-11 HISTORY — DX: Headache, unspecified: R51.9

## 2023-11-11 LAB — COMPREHENSIVE METABOLIC PANEL WITH GFR
ALT: 31 U/L (ref 0–44)
AST: 53 U/L — ABNORMAL HIGH (ref 15–41)
Albumin: 4.3 g/dL (ref 3.5–5.0)
Alkaline Phosphatase: 36 U/L — ABNORMAL LOW (ref 38–126)
Anion gap: 18 — ABNORMAL HIGH (ref 5–15)
BUN: 27 mg/dL — ABNORMAL HIGH (ref 8–23)
CO2: 17 mmol/L — ABNORMAL LOW (ref 22–32)
Calcium: 9.2 mg/dL (ref 8.9–10.3)
Chloride: 104 mmol/L (ref 98–111)
Creatinine, Ser: 1.15 mg/dL (ref 0.61–1.24)
GFR, Estimated: >60 mL/min (ref >60)
Glucose, Bld: 136 mg/dL — ABNORMAL HIGH (ref 70–99)
Potassium: 3.9 mmol/L (ref 3.5–5.1)
Sodium: 139 mmol/L (ref 135–145)
Total Bilirubin: 1.7 mg/dL — ABNORMAL HIGH (ref 0.0–1.2)
Total Protein: 7.3 g/dL (ref 6.5–8.1)

## 2023-11-11 LAB — CBC
HCT: 41 % (ref 39.0–52.0)
Hemoglobin: 13.9 g/dL (ref 13.0–17.0)
MCH: 31.2 pg (ref 26.0–34.0)
MCHC: 33.9 g/dL (ref 30.0–36.0)
MCV: 91.9 fL (ref 80.0–100.0)
Platelets: 222 K/uL (ref 150–400)
RBC: 4.46 MIL/uL (ref 4.22–5.81)
RDW: 12.7 % (ref 11.5–15.5)
WBC: 16.1 K/uL — ABNORMAL HIGH (ref 4.0–10.5)
nRBC: 0 % (ref 0.0–0.2)

## 2023-11-11 LAB — TROPONIN I (HIGH SENSITIVITY)
Troponin I (High Sensitivity): 7 ng/L (ref <18)
Troponin I (High Sensitivity): 6 ng/L (ref <18)

## 2023-11-11 LAB — LIPASE, BLOOD: Lipase: 1256 U/L — ABNORMAL HIGH (ref 11–51)

## 2023-11-11 MED ORDER — ACETAMINOPHEN 325 MG PO TABS
650.0000 mg | ORAL_TABLET | Freq: Four times a day (QID) | ORAL | Status: AC | PRN
  Administered 2023-11-11: 650 mg via ORAL
  Filled 2023-11-11: qty 2

## 2023-11-11 MED ORDER — ONDANSETRON 4 MG PO TBDP
4.0000 mg | ORAL_TABLET | Freq: Once | ORAL | Status: AC
  Administered 2023-11-11: 4 mg via ORAL
  Filled 2023-11-11: qty 1

## 2023-11-11 NOTE — ED Triage Notes (Signed)
 PT BIB EMS from home. Chest and abd pain started this morning. Pt drank 1 pint of liquor last night The patient has a history of of pancreatitis. 1 episode of vomiting.   324 asa 97% 133 cbg 90pulse

## 2023-11-11 NOTE — Telephone Encounter (Signed)
 Requested medication (s) are due for refill today: yes  Requested medication (s) are on the active medication list: yes  Last refill:  10/17/23  Future visit scheduled: yes  Notes to clinic:  Unable to refill per protocol, last refill by another/ ED provider. Routing to PCP for approval.     Requested Prescriptions  Pending Prescriptions Disp Refills   Rimegepant Sulfate (NURTEC) 75 MG TBDP 30 tablet 0    Sig: Take 1 tablet (75 mg total) by mouth daily as needed (migraine headache).     Off-Protocol Failed - 11/11/2023  8:55 AM      Failed - Medication not assigned to a protocol, review manually.      Passed - Valid encounter within last 12 months    Recent Outpatient Visits           1 week ago    [place] [facility] Martin Rosaline SQUIBB, NP   8 months ago Essential hypertension   [place] [facility] Martin Rosaline SQUIBB, NP   1 year ago Toe infection   [place] [facility] Martin Rosaline SQUIBB, NP   1 year ago Essential hypertension   [place] [facility] Martin Rosaline SQUIBB, NP   1 year ago Screening for diabetes mellitus   [place] [facility] Martin Rosaline SQUIBB, NP

## 2023-11-11 NOTE — ED Provider Triage Note (Signed)
 Emergency Medicine Provider Triage Evaluation Note  [name] , a 63 y.o. male  was evaluated in triage.  Pt complains of epigastric pain, chest pain.  History of heavy liquor drinking, history of pancreatitis.  1 episode of vomiting.  Denies any shortness of breath.  Reports nothing to eat today.  Last drink yesterday..  Review of Systems  Positive: Chest pain, abdominal pain, nausesa Negative:   Physical Exam  BP 129/88 (BP Location: Right Arm)   Pulse 94   Temp (!) 97.5 F (36.4 C) (Oral)   Resp (!) 22   Wt 120 kg   SpO2 96%   BMI 35.88 kg/m  Gen:   Awake, no distress   Resp:  Normal effort  MSK:   Moves extremities without difficulty  Other:  Focal ttp in epigastric region with guarding, no rebound, rigidity  Medical Decision Making  Medically screening exam initiated at 6:52 PM.  Appropriate orders placed.  [name] was informed that the remainder of the evaluation will be completed by another provider, this initial triage assessment does not replace that evaluation, and the importance of remaining in the ED until their evaluation is complete.  Workup initiated in triage    [name] DEL, [place] 11/11/23 1853

## 2023-11-12 ENCOUNTER — Encounter (HOSPITAL_COMMUNITY): Payer: Self-pay | Admitting: Internal Medicine

## 2023-11-12 ENCOUNTER — Emergency Department (HOSPITAL_COMMUNITY): Payer: MEDICAID

## 2023-11-12 ENCOUNTER — Other Ambulatory Visit: Payer: Self-pay

## 2023-11-12 DIAGNOSIS — K2920 Alcoholic gastritis without bleeding: Secondary | ICD-10-CM | POA: Diagnosis present

## 2023-11-12 DIAGNOSIS — Z833 Family history of diabetes mellitus: Secondary | ICD-10-CM | POA: Diagnosis not present

## 2023-11-12 DIAGNOSIS — Z87891 Personal history of nicotine dependence: Secondary | ICD-10-CM | POA: Diagnosis not present

## 2023-11-12 DIAGNOSIS — F1010 Alcohol abuse, uncomplicated: Secondary | ICD-10-CM

## 2023-11-12 DIAGNOSIS — F1020 Alcohol dependence, uncomplicated: Secondary | ICD-10-CM | POA: Diagnosis present

## 2023-11-12 DIAGNOSIS — R7989 Other specified abnormal findings of blood chemistry: Secondary | ICD-10-CM

## 2023-11-12 DIAGNOSIS — K852 Alcohol induced acute pancreatitis without necrosis or infection: Principal | ICD-10-CM

## 2023-11-12 DIAGNOSIS — E876 Hypokalemia: Secondary | ICD-10-CM | POA: Diagnosis present

## 2023-11-12 DIAGNOSIS — R109 Unspecified abdominal pain: Secondary | ICD-10-CM | POA: Diagnosis present

## 2023-11-12 DIAGNOSIS — N281 Cyst of kidney, acquired: Secondary | ICD-10-CM | POA: Diagnosis present

## 2023-11-12 DIAGNOSIS — E785 Hyperlipidemia, unspecified: Secondary | ICD-10-CM | POA: Diagnosis present

## 2023-11-12 DIAGNOSIS — I10 Essential (primary) hypertension: Secondary | ICD-10-CM | POA: Diagnosis present

## 2023-11-12 DIAGNOSIS — I2510 Atherosclerotic heart disease of native coronary artery without angina pectoris: Secondary | ICD-10-CM | POA: Diagnosis present

## 2023-11-12 DIAGNOSIS — K219 Gastro-esophageal reflux disease without esophagitis: Secondary | ICD-10-CM | POA: Diagnosis present

## 2023-11-12 DIAGNOSIS — R1084 Generalized abdominal pain: Secondary | ICD-10-CM | POA: Diagnosis not present

## 2023-11-12 DIAGNOSIS — K2980 Duodenitis without bleeding: Secondary | ICD-10-CM | POA: Diagnosis present

## 2023-11-12 DIAGNOSIS — I700 Atherosclerosis of aorta: Secondary | ICD-10-CM | POA: Diagnosis present

## 2023-11-12 DIAGNOSIS — E872 Acidosis, unspecified: Secondary | ICD-10-CM | POA: Diagnosis present

## 2023-11-12 DIAGNOSIS — K567 Ileus, unspecified: Secondary | ICD-10-CM | POA: Diagnosis not present

## 2023-11-12 DIAGNOSIS — Z6838 Body mass index (BMI) 38.0-38.9, adult: Secondary | ICD-10-CM | POA: Diagnosis not present

## 2023-11-12 DIAGNOSIS — Z8249 Family history of ischemic heart disease and other diseases of the circulatory system: Secondary | ICD-10-CM | POA: Diagnosis not present

## 2023-11-12 DIAGNOSIS — R3129 Other microscopic hematuria: Secondary | ICD-10-CM | POA: Diagnosis present

## 2023-11-12 DIAGNOSIS — R195 Other fecal abnormalities: Secondary | ICD-10-CM | POA: Diagnosis present

## 2023-11-12 DIAGNOSIS — E66811 Obesity, class 1: Secondary | ICD-10-CM | POA: Diagnosis present

## 2023-11-12 DIAGNOSIS — K760 Fatty (change of) liver, not elsewhere classified: Secondary | ICD-10-CM | POA: Diagnosis present

## 2023-11-12 DIAGNOSIS — M109 Gout, unspecified: Secondary | ICD-10-CM | POA: Diagnosis present

## 2023-11-12 DIAGNOSIS — E860 Dehydration: Secondary | ICD-10-CM | POA: Diagnosis present

## 2023-11-12 LAB — URINALYSIS, ROUTINE W REFLEX MICROSCOPIC
Bacteria, UA: NONE SEEN
Bilirubin Urine: NEGATIVE
Glucose, UA: NEGATIVE mg/dL
Ketones, ur: NEGATIVE mg/dL
Leukocytes,Ua: NEGATIVE
Nitrite: NEGATIVE
Protein, ur: 100 mg/dL — AB
Specific Gravity, Urine: >1.046 — ABNORMAL HIGH (ref 1.005–1.030)
pH: 5 (ref 5.0–8.0)

## 2023-11-12 LAB — ETHANOL: Alcohol, Ethyl (B): <15 mg/dL (ref <15)

## 2023-11-12 LAB — LIPID PANEL
Cholesterol: 128 mg/dL (ref 0–200)
HDL: 60 mg/dL (ref >40)
LDL Cholesterol: 38 mg/dL (ref 0–99)
Total CHOL/HDL Ratio: 2.1 ratio
Triglycerides: 148 mg/dL (ref <150)
VLDL: 30 mg/dL (ref 0–40)

## 2023-11-12 LAB — I-STAT CG4 LACTIC ACID, ED: Lactic Acid, Venous: 2.8 mmol/L (ref 0.5–1.9)

## 2023-11-12 LAB — LACTIC ACID, PLASMA: Lactic Acid, Venous: 2.6 mmol/L (ref 0.5–1.9)

## 2023-11-12 MED ORDER — ALBUTEROL SULFATE (2.5 MG/3ML) 0.083% IN NEBU: 2.5000 mg | INHALATION_SOLUTION | RESPIRATORY_TRACT | Status: DC | PRN

## 2023-11-12 MED ORDER — HYDROMORPHONE HCL 1 MG/ML IJ SOLN
0.5000 mg | INTRAMUSCULAR | Status: DC | PRN
  Administered 2023-11-15 – 2023-11-16 (×3): 0.5 mg via INTRAVENOUS
  Filled 2023-11-12 (×3): qty 0.5

## 2023-11-12 MED ORDER — HYDROMORPHONE HCL 1 MG/ML IJ SOLN
1.0000 mg | INTRAMUSCULAR | Status: DC | PRN
  Administered 2023-11-12 – 2023-11-16 (×21): 1 mg via INTRAVENOUS
  Filled 2023-11-12 (×21): qty 1

## 2023-11-12 MED ORDER — SODIUM CHLORIDE 0.9 % IV BOLUS (SEPSIS)
1000.0000 mL | Freq: Once | INTRAVENOUS | Status: AC
  Administered 2023-11-12: 1000 mL via INTRAVENOUS

## 2023-11-12 MED ORDER — ONDANSETRON HCL 4 MG/2ML IJ SOLN
4.0000 mg | Freq: Four times a day (QID) | INTRAMUSCULAR | Status: DC | PRN
  Administered 2023-11-12 – 2023-11-16 (×14): 4 mg via INTRAVENOUS
  Filled 2023-11-12 (×14): qty 2

## 2023-11-12 MED ORDER — SODIUM CHLORIDE 0.9% FLUSH
3.0000 mL | Freq: Two times a day (BID) | INTRAVENOUS | Status: DC
  Administered 2023-11-12 – 2023-11-20 (×15): 3 mL via INTRAVENOUS

## 2023-11-12 MED ORDER — LORAZEPAM 2 MG/ML IJ SOLN: 1.0000 mg | INTRAMUSCULAR | Status: AC | PRN

## 2023-11-12 MED ORDER — SUCRALFATE 1 GM/10ML PO SUSP
1.0000 g | Freq: Three times a day (TID) | ORAL | Status: DC
  Administered 2023-11-12 – 2023-11-16 (×18): 1 g via ORAL
  Filled 2023-11-12 (×21): qty 10

## 2023-11-12 MED ORDER — CARVEDILOL 12.5 MG PO TABS: 25.0000 mg | ORAL_TABLET | Freq: Two times a day (BID) | ORAL | Status: DC

## 2023-11-12 MED ORDER — SODIUM CHLORIDE 0.9 % IV BOLUS
500.0000 mL | Freq: Once | INTRAVENOUS | Status: AC
  Administered 2023-11-12: 500 mL via INTRAVENOUS

## 2023-11-12 MED ORDER — ADULT MULTIVITAMIN W/MINERALS CH
1.0000 | ORAL_TABLET | Freq: Every day | ORAL | Status: DC
  Administered 2023-11-12 – 2023-11-20 (×9): 1 via ORAL
  Filled 2023-11-12 (×10): qty 1

## 2023-11-12 MED ORDER — MELATONIN 3 MG PO TABS: 6.0000 mg | ORAL_TABLET | Freq: Every evening | ORAL | Status: DC | PRN

## 2023-11-12 MED ORDER — PANTOPRAZOLE SODIUM 40 MG IV SOLR
40.0000 mg | Freq: Two times a day (BID) | INTRAVENOUS | Status: DC
  Administered 2023-11-12 – 2023-11-19 (×15): 40 mg via INTRAVENOUS
  Filled 2023-11-12 (×15): qty 10

## 2023-11-12 MED ORDER — LORAZEPAM 1 MG PO TABS
1.0000 mg | ORAL_TABLET | ORAL | Status: AC | PRN
  Administered 2023-11-13: 1 mg via ORAL
  Filled 2023-11-12: qty 1

## 2023-11-12 MED ORDER — THIAMINE MONONITRATE 100 MG PO TABS
100.0000 mg | ORAL_TABLET | Freq: Every day | ORAL | Status: DC
  Administered 2023-11-13 – 2023-11-20 (×8): 100 mg via ORAL
  Filled 2023-11-12 (×9): qty 1

## 2023-11-12 MED ORDER — HYDROMORPHONE HCL 1 MG/ML IJ SOLN
1.0000 mg | Freq: Once | INTRAMUSCULAR | Status: AC
  Administered 2023-11-12: 1 mg via INTRAVENOUS
  Filled 2023-11-12: qty 1

## 2023-11-12 MED ORDER — ACETAMINOPHEN 500 MG PO TABS
1000.0000 mg | ORAL_TABLET | Freq: Four times a day (QID) | ORAL | Status: DC | PRN
  Administered 2023-11-15 – 2023-11-20 (×12): 1000 mg via ORAL
  Filled 2023-11-12 (×12): qty 2

## 2023-11-12 MED ORDER — METOCLOPRAMIDE HCL 5 MG/ML IJ SOLN
10.0000 mg | Freq: Once | INTRAMUSCULAR | Status: AC
  Administered 2023-11-12: 10 mg via INTRAVENOUS
  Filled 2023-11-12: qty 2

## 2023-11-12 MED ORDER — ATORVASTATIN CALCIUM 40 MG PO TABS
40.0000 mg | ORAL_TABLET | Freq: Every day | ORAL | Status: DC
  Administered 2023-11-12 – 2023-11-20 (×9): 40 mg via ORAL
  Filled 2023-11-12 (×11): qty 1

## 2023-11-12 MED ORDER — HYDROMORPHONE HCL 1 MG/ML IJ SOLN
0.5000 mg | INTRAMUSCULAR | Status: DC | PRN
  Administered 2023-11-12 (×2): 0.5 mg via INTRAVENOUS
  Filled 2023-11-12 (×2): qty 1

## 2023-11-12 MED ORDER — SODIUM CHLORIDE 0.9 % IV SOLN
1000.0000 mL | INTRAVENOUS | Status: DC
  Administered 2023-11-12 – 2023-11-18 (×14): 1000 mL via INTRAVENOUS

## 2023-11-12 MED ORDER — IOHEXOL 350 MG/ML SOLN
100.0000 mL | Freq: Once | INTRAVENOUS | Status: AC | PRN
  Administered 2023-11-12: 100 mL via INTRAVENOUS

## 2023-11-12 MED ORDER — POLYETHYLENE GLYCOL 3350 17 G PO PACK
17.0000 g | PACK | Freq: Every day | ORAL | Status: DC | PRN
  Administered 2023-11-16: 17 g via ORAL
  Filled 2023-11-12: qty 1

## 2023-11-12 MED ORDER — FOLIC ACID 1 MG PO TABS
1.0000 mg | ORAL_TABLET | Freq: Every day | ORAL | Status: DC
  Administered 2023-11-12 – 2023-11-20 (×9): 1 mg via ORAL
  Filled 2023-11-12 (×10): qty 1

## 2023-11-12 MED ORDER — SODIUM CHLORIDE 0.9 % IV SOLN
1000.0000 mL | INTRAVENOUS | Status: DC
  Administered 2023-11-12: 1000 mL via INTRAVENOUS

## 2023-11-12 MED ORDER — THIAMINE HCL 100 MG/ML IJ SOLN
100.0000 mg | Freq: Every day | INTRAMUSCULAR | Status: DC
  Administered 2023-11-12: 100 mg via INTRAVENOUS
  Filled 2023-11-12: qty 2

## 2023-11-12 MED ORDER — AMLODIPINE BESYLATE 5 MG PO TABS: 10.0000 mg | ORAL_TABLET | Freq: Every day | ORAL | Status: DC

## 2023-11-12 NOTE — ED Notes (Addendum)
 [address] attempted to be called to be notified pt to be transported to floor.

## 2023-11-12 NOTE — H&P (Signed)
 History and Physical    [name] FMW:[id number] DOB: 1961-04-07 DOA: 11/11/2023  PCP: [name], NP   Patient coming from: Home   Chief Complaint:  Chief Complaint  Patient presents with   Abdominal Pain   Chest Pain    HPI:  [name] is a 63 y.o. male with hx of alcohol use disorder with history of recurrent alcohol related pancreatitis, hypertension, hyperlipidemia, who presents with worsening upper abdominal pain.  Reports onset of symptoms yesterday morning after of night of heavy drinking the night before.  He is drinking about a pint of liquor or sixpack of beer per day.  Yesterday morning developed sudden onset of a band of upper abdominal pain which has been persistent since onset, not relieved with Tylenol  at home.  Pain remains severe despite dilaudid  in the ED. He has had nausea and vomiting with dark, questionable coffee-ground emesis.  Has also had recent dark stools.  Reports previously going to rehab for alcohol use but relapsed, he is interested in quitting drinking again and interested in medication assisted treatment for alcohol use disorder.   Review of Systems:  ROS complete and negative except as marked above   Allergies  Allergen Reactions   Hydrocodone -Acetaminophen  Palpitations   Shrimp [Shellfish Allergy] Other (See Comments)    Cholesterol level becomes very high. Patient reports sweating and itching.    Prior to Admission medications   Medication Sig Start Date End Date Taking? Authorizing Provider  amLODipine  (NORVASC ) 10 MG tablet Take 1 tablet (10 mg total) by mouth daily. 11/05/23   [name], MD  atorvastatin  (LIPITOR) 40 MG tablet Take 1 tablet (40 mg total) by mouth daily. 11/05/23   [name], MD  carvedilol  (COREG ) 25 MG tablet Take 1 tablet (25 mg total) by mouth 2 (two) times daily with a meal. 11/05/23   [name], MD  chlordiazePOXIDE  (LIBRIUM ) 25 MG capsule 50mg  PO TID x 1D, then 25-50mg  PO BID X 1D, then  25-50mg  PO QD X 1D Patient not taking: Reported on 11/05/2023 10/17/23   Long, Fonda MATSU, MD  cyclobenzaprine  (FLEXERIL ) 10 MG tablet Take 1 tablet by mouth three times daily as needed for muscle spasm 08/09/21   [name], NP  losartan  (COZAAR ) 25 MG tablet Take 1 tablet (25 mg total) by mouth daily. 11/05/23   [name], MD  Melatonin 10 MG CAPS Take 10 mg by mouth at bedtime as needed. 01/10/22   [name], NP  Multiple Vitamins-Minerals (MULTIVITAMIN WITH MINERALS) tablet Take 1 tablet by mouth daily.    [provider]  omeprazole  (PRILOSEC) 40 MG capsule Take 1 capsule (40 mg total) by mouth daily. 10/03/23   [name], NP  oxyCODONE -acetaminophen  (PERCOCET/ROXICET) 5-325 MG tablet Take 1 tablet by mouth every 6 (six) hours as needed for severe pain (pain score 7-10). 05/30/23   Logan Ubaldo NOVAK, PA-C  potassium chloride  SA (KLOR-CON  M) 20 MEQ tablet Take 1 tablet (20 mEq total) by mouth daily. 09/21/21   Abran Norleen SAILOR, MD  Rimegepant Sulfate (NURTEC) 75 MG TBDP Take 1 tablet (75 mg total) by mouth daily as needed (migraine headache). Patient not taking: Reported on 11/05/2023 10/17/23   Long, Fonda MATSU, MD  rizatriptan  (MAXALT -MLT) 10 MG disintegrating tablet Take 1 tablet (10 mg total) by mouth as needed for migraine. May repeat in 2 hours if needed Patient not taking: Reported on 11/05/2023 09/19/20   Penumalli, Vikram R, MD  Tetrahydrozoline  HCl (VISINE OP) Place 1 drop into both eyes daily as needed (redness).    [provider]  triamcinolone  cream (KENALOG ) 0.1 % Apply 1 application topically 2 (two) times daily. Patient not taking: Reported on 11/05/2023 09/08/19   Arloa Suzen RAMAN, NP  diphenhydrAMINE  (BENADRYL ) 25 MG tablet Take 1 tablet (25 mg total) by mouth every 6 (six) hours as needed. 04/04/20 06/17/20  Armenta Canning, MD  metoCLOPramide  (REGLAN ) 10 MG tablet Take 1 tablet (10 mg total) by mouth every 6 (six) hours as needed for nausea  (nausea/headache). Take this tablet with 25 mg of Benadryl  and two extra strength Tylenol  for headache. 04/27/20 06/17/20  [name], NP    Past Medical History:  Diagnosis Date   Alcohol abuse    Allergy    Anxiety    Arthritis    Diverticulosis of colon (without mention of hemorrhage)    GERD (gastroesophageal reflux disease)    Gout    History of kidney stones    Hyperlipemia    Hypertension    dr garrel  norins   Kidney stones    Nephrolithiasis    Pancreatitis    Hx of    Ureteral stent displacement (HCC) 1990    Past Surgical History:  Procedure Laterality Date   A2 pulley flexor sheath cyst 4th finger excisional biopsy     '03   ANTERIOR CRUCIATE LIGAMENT REPAIR     R knee   ESOPHAGOGASTRODUODENOSCOPY N/A 07/09/2012   Procedure: ESOPHAGOGASTRODUODENOSCOPY (EGD);  Surgeon: Lamar JONETTA Aho, MD;  Location: [facility];  Service: Endoscopy;  Laterality: N/A;   KNEE SURGERY  2008   LEFT HEART CATH AND CORONARY ANGIOGRAPHY N/A 08/10/2019   Procedure: LEFT HEART CATH AND CORONARY ANGIOGRAPHY;  Surgeon: [name], MD;  Location: [facility];  Service: Cardiovascular;  Laterality: N/A;   SHOULDER ARTHROSCOPY WITH SUBACROMIAL DECOMPRESSION AND OPEN ROTATOR C Right 12/26/2012   Procedure: RIGHT SHOULDER ARTHROSCOPY WITH SUBACROMIAL DECOMPRESSION MINI OPEN ROTATOR CUFF REPAIR, OPEN BICEP TENODESIS OPEN DISTAL CLAVICLE RESECTION  ;  Surgeon: Elspeth JONELLE Her, MD;  Location: [facility];  Service: Orthopedics;  Laterality: Right;   TRANSFORAMINAL LUMBAR INTERBODY FUSION (TLIF) WITH PEDICLE SCREW FIXATION 1 LEVEL Right 03/27/2018   Procedure: RIGHT-SIDED LUMBAR 4-5 TRANSFORAMINAL LUMBAR INTERBODY FUSION WITH INSTRUMENTATION AND ALLOGRAFT;  Surgeon: Beuford Anes, MD;  Location: [facility];  Service: Orthopedics;  Laterality: Right;   URETEROSCOPY     and stone retreval     reports that he quit smoking about 25 years ago. His smoking use included cigarettes. He has never used  smokeless tobacco. He reports current alcohol use of about 12.0 standard drinks of alcohol per week. He reports that he does not use drugs.  Family History  Problem Relation Age of Onset   Diabetes Mother        x2 brothers   Hypertension Father        X4 siblings   Heart disease Father    Colon cancer Neg Hx    Esophageal cancer Neg Hx      Physical Exam: Vitals:   11/12/23 0330 11/12/23 0440 11/12/23 0545 11/12/23 0600  BP: 120/65  (!) 151/107 (!) 162/104  Pulse: (!) 106  (!) 113 (!) 104  Resp: 19  20 18   Temp:  99.1 F (37.3 C)    TempSrc:  Oral    SpO2: 98%  96% 99%  Weight:        Gen: Awake, alert, chronically  ill-appearing CV: Regular, normal S1, S2, no murmurs  Resp: Normal WOB, CTAB  Abd: Round, nondistended, hypoactive, moderate upper abdominal tenderness.  No rebound, guarding, rigidity.   MSK: Symmetric, no edema  Skin: No rashes or lesions to exposed skin  Neuro: Alert and interactive  Psych: euthymic, appropriate    Data review:   Labs reviewed, notable for:   Lipase 1256, T. bili 1.7 not fractionated, alk phos 36, AST 53, ALT 31  Bicarb 17, AG 18, lactate 2.8 WBC 16 UA microscopic hematuria  Micro:  Results for orders placed or performed during the hospital encounter of 06/17/20  Urine Culture     Status: Abnormal   Collection Time: 06/17/20  8:10 PM   Specimen: Urine, Random  Result Value Ref Range Status   Specimen Description URINE, RANDOM  Final   Special Requests   Final    NONE Performed at [facility], [address], [place], [place] [address]    Culture >=100,000 COLONIES/mL ESCHERICHIA COLI (A)  Final   Report Status 06/20/2020 FINAL  Final   Organism ID, Bacteria ESCHERICHIA COLI (A)  Final      Susceptibility   Escherichia coli - MIC*    AMPICILLIN <=2 SENSITIVE Sensitive     CEFAZOLIN  <=4 SENSITIVE Sensitive     CEFEPIME <=0.12 SENSITIVE Sensitive     CEFTRIAXONE  <=0.25 SENSITIVE Sensitive     CIPROFLOXACIN  <=0.25  SENSITIVE Sensitive     GENTAMICIN <=1 SENSITIVE Sensitive     IMIPENEM <=0.25 SENSITIVE Sensitive     NITROFURANTOIN <=16 SENSITIVE Sensitive     TRIMETH /SULFA  <=20 SENSITIVE Sensitive     AMPICILLIN/SULBACTAM <=2 SENSITIVE Sensitive     PIP/TAZO <=4 SENSITIVE Sensitive     * >=100,000 COLONIES/mL ESCHERICHIA COLI    Imaging reviewed:  CT ABDOMEN PELVIS W CONTRAST Result Date: 11/12/2023 CLINICAL DATA:  Severe pancreatitis symptoms with previous history of pancreatitis. EXAM: CT ABDOMEN AND PELVIS WITH CONTRAST TECHNIQUE: Multidetector CT imaging of the abdomen and pelvis was performed using the standard protocol following bolus administration of intravenous contrast. RADIATION DOSE REDUCTION: This exam was performed according to the departmental dose-optimization program which includes automated exposure control, adjustment of the mA and/or kV according to patient size and/or use of iterative reconstruction technique. CONTRAST:  OMNIPAQUE  IOHEXOL  350 MG/ML SOLN COMPARISON:  CT without contrast 01/12/2021, CT with contrast 05/19/2018. FINDINGS: Lower chest: Lung bases are clear. The cardiac size is normal. Small hiatal hernia. There are 2-vessel coronary calcifications in the LAD and right coronary arteries. Hepatobiliary: The liver is 22 cm in length with mild-to-moderate steatosis. There is no mass enhancement. No cirrhotic surface changes to the liver contour. Gallbladder and bile ducts are unremarkable. Pancreas: There is generalized pancreatic edema and swelling consistent with acute interstitial pancreatitis. There is moderate peripancreatic inflammatory reaction with nonlocalized fluid extending into the hypogastrium, perigastrium and lesser sac, and surrounding the pancreas and layering over the bilateral Gerota's fascia with small amount collecting in the pelvis. The fluid is low in density not suspicious for hemorrhage. There is no free air or abscess. There is no pancreatic mass  enhancement or ductal dilatation. Spleen: No abnormality. Adrenals/Urinary Tract: There is a 2 cm Bosniak 1 cyst of the medial upper pole right kidney, Hounsfield density is 17. There are additional scattered occasional bilateral tiny Bosniak 2 cortical cysts which are too small to characterize. No follow-up imaging is recommended. JACR 2018 Feb; 264-273, Management of the Incidental Renal Mass on CT, [employer] 2021;  [phone], Bosniak Classification of Cystic Renal Masses, Version 2019. There is no adrenal or renal mass enhancement. No urinary stone or obstruction is seen. There is symmetric excretion on the delayed images. The bladder is unremarkable for the degree of distension. Stomach/Bowel: There are thickened folds in the duodenum, probably reactive due to the pancreatitis or could be coexisting duodenitis. Rest of the small bowel and gastric wall are unremarkable. Normal caliber appendix. The cecum is mobile located in the anterior right mid abdomen. There is diffuse diverticulosis without evidence of acute diverticulitis. Vascular/Lymphatic: Aortic atherosclerosis. No enlarged abdominal or pelvic lymph nodes. Multiple pelvic phleboliths. Reproductive: Prostate is unremarkable. Other: There are small umbilical and inguinal fat hernias. No incarcerated hernia. No free hemorrhage, free air or abscess. Musculoskeletal: L4-5 bilateral dorsal fusion rods and pedicle screws are again noted with solid interbody fusion. Mild degenerative change of the spine is also seen. IMPRESSION: 1. Acute interstitial pancreatitis with moderate peripancreatic inflammatory reaction and nonlocalized patchy fluid extending into the hypogastrium, perigastrium and lesser sac, and layering over the bilateral Gerota's fascia with small amount collecting in the pelvis. No free air or abscess. 2. Thickened folds in the duodenum probably reactive due to the pancreatitis or could be coexisting duodenitis. 3. Diverticulosis without  evidence of diverticulitis. 4. Aortic and coronary artery atherosclerosis. 5. Small hiatal hernia. 6. Umbilical and inguinal fat hernias. 7. Bosniak 1 and 2 renal cysts. Aortic Atherosclerosis (ICD10-I70.0). Electronically Signed   By: Francis Quam M.D.   On: 11/12/2023 02:05   DG Chest Port 1 View Result Date: 11/12/2023 CLINICAL DATA:  Chest pain and abdominal pain. EXAM: PORTABLE CHEST 1 VIEW COMPARISON:  01/12/2021 FINDINGS: Shallow inspiration. Heart size and pulmonary vascularity are normal for technique. Lungs are clear. No pleural effusion or pneumothorax. Mediastinal contours appear intact. Calcification of the aorta. IMPRESSION: No active disease. Electronically Signed   By: Elsie Gravely M.D.   On: 11/12/2023 01:11    EKG:  Personally reviewed, sinus rhythm, no acute ischemic changes  ED Course:  Treated with 2 L IV fluid and started on 125 an hour, Dilaudid  0.5 mg x 2 without relief of pain.   Assessment/Plan:  63 y.o. male with hx  alcohol use disorder with history of recurrent alcohol related pancreatitis, hypertension, hyperlipidemia, who presents with worsening upper abdominal pain in setting of heavy alcohol use, admitted for alcoholic pancreatitis.  Alcoholic pancreatitis Lactic acidosis Acute onset after binge drinking.  On initial evaluation tachycardic.  Afebrile.  Labs Lipase 1256, T. bili 1.7 not fractionated, alk phos 36, AST 53, ALT 31. WBC 16. CT abdomen pelvis demonstrating interstitial pancreatitis with moderate peripancreatic inflammatory change, and patchy surrounding fluid collection, no pancreatic mass, no pancreatic or biliary ductal dilation no gallstones noted. - GI consult messaged [facility] Dr. San, group to see  - N.p.o. with sips - S/p 2 L IV fluid.  With continued elevation in lactate given additional 500 cc, increase NS to 150 cc an hour for now.  Trend lactate - If he develops fever or worsening signs of sepsis will place on antibiotic for  possible surrounding infected collection, less likely at this point -Symptomatic management Dilaudid  0.5/1 mg IV every 4 hours as needed for moderate/severe.  Zofran  as needed -Check lipids   Suspect upper GI bleed, likely minor bleeding  Duodenitis, likely gastritis, alcohol related. Reports small amount of dark possible coffee-ground emesis and dark stools over the past day.  Tachycardic although suspect related to pancreatitis.  Hemoglobin 13.  Hepatomegaly on imaging, no known underlying cirrhosis.  - GI consult per above - N.p.o. with sips for now  - Pantoprazole  40 mg IV twice daily - Sucralfate  1 g 4 times daily - Trend hemoglobin, check coags  Severe alcohol use disorder Drinking 1 pint of liquor, or sixpack per day.  Previous success with rehabilitation program outpatient but relapsed.  He is motivated to quit and understands the serious impact on his health.  - CIWA, Ativan  as needed - Thiamine , multivitamin, folate - TOC for substance use resources - Interested in medication assisted therapy for alcohol use disorder would consider for naltrexone, acamprosate   Acute liver injury, likely alcohol related Hepatomegaly on imaging, hepatic steatosis - Trend LFT - Check hep B, C serology  Incidental findings:  Aortic, coronary atherosclerosis Bosniak 2 renal cyst Microscopic hematuria  Chronic medical problems:  Hypertension: Hold his home amlodipine , losartan , carvedilol  in the setting of volume depletion and lactic acidosis. Hyperlipidemia: Continue home atorvastatin    Body mass index is 35.88 kg/m.    DVT prophylaxis:  SCDs Code Status:  Full Code Diet:  Diet Orders (From admission, onward)     Start     Ordered   11/12/23 0712  Diet NPO time specified Except for: Ice Chips, Sips with Meds, Other (See Comments)  Diet effective now       Comments: Sips clears ok  Question Answer Comment  Except for Ice Chips   Except for Sips with Meds   Except for Other (See  Comments)      11/12/23 0715           Family Communication: None Consults: [facility] Admission status:   Inpatient, Telemetry bed  Severity of Illness: The appropriate patient status for this patient is INPATIENT. Inpatient status is judged to be reasonable and necessary in order to provide the required intensity of service to ensure the patient's safety. The patient's presenting symptoms, physical exam findings, and initial radiographic and laboratory data in the context of their chronic comorbidities is felt to place them at high risk for further clinical deterioration. Furthermore, it is not anticipated that the patient will be medically stable for discharge from the hospital within 2 midnights of admission.   * I certify that at the point of admission it is my clinical judgment that the patient will require inpatient hospital care spanning beyond 2 midnights from the point of admission due to high intensity of service, high risk for further deterioration and high frequency of surveillance required.*   Dorn Dawson, MD [facility]  How to contact the [facility] Attending or Consulting provider 7A - 7P or covering provider during after hours 7P -7A, for this patient.  Check the care team in [facility] and look for a) attending/consulting [facility] provider listed and b) the [facility] team listed Log into [email or web address] and use 's universal password to access. If you do not have the password, please contact the hospital operator. Locate the [facility] provider you are looking for under [facility] and page to a number that you can be directly reached. If you still have difficulty reaching the provider, please page the [facility] (Director on Call) for the Hospitalists listed on amion for assistance.  11/12/2023, 7:35 AM

## 2023-11-12 NOTE — Plan of Care (Signed)
   Problem: Education: Goal: Knowledge of General Education information will improve Description Including pain rating scale, medication(s)/side effects and non-pharmacologic comfort measures Outcome: Progressing   Problem: Health Behavior/Discharge Planning: Goal: Ability to manage health-related needs will improve Outcome: Progressing

## 2023-11-12 NOTE — Progress Notes (Signed)
 63 y.o. male with hx of alcohol use disorder with history of recurrent alcohol related pancreatitis, hypertension, hyperlipidemia, who presents with worsening upper abdominal pain.  Reports onset of symptoms yesterday morning after of night of heavy drinking the night before.  He is drinking about a pint of liquor or sixpack of beer per day.  Yesterday morning developed sudden onset of a band of upper abdominal pain which has been persistent since onset, not relieved with Tylenol  at home.  Pain remains severe despite dilaudid  in the ED. He has had nausea and vomiting with dark, questionable coffee-ground emesis.  Has also had recent dark stools.  Reports previously going to rehab for alcohol use but relapsed, he is interested in quitting drinking again and interested in medication assisted treatment for alcohol use disorder.   Patient admitted for alcoholic pancreatitis lactic acidosis suspected upper GI bleed GI has been consulted and on conservative management This morning mildly tachycardic BP stable on room air afebrile Lactic acid 2.8 lipase 1256 on admission. Alert awake oriented no acute uncomfortable, complains of a little abdominal pain nausea. Abdomen soft distended, positive Batus Continue on multivitamin thiamine  folate with IV fluid hydration He is at risk of withdrawal

## 2023-11-12 NOTE — Progress Notes (Signed)
 CSW added substance abuse resources to patient's AVS.  Edwin Dada, MSW, LCSW Transitions of Care  Clinical Social Worker II [phone]

## 2023-11-12 NOTE — ED Provider Notes (Signed)
 [place] EMERGENCY DEPARTMENT AT [facility] Provider Note  CSN: [id number] Arrival date & time: 11/11/23 1818  Chief Complaint(s) Abdominal Pain and Chest Pain  HPI [name] is a 63 y.o. male with a past medical history listed below including alcohol use disorder and prior pancreatitis here for 1 day of gradually worsening periumbilical and epigastric abdominal pain now more generalized with episodes of nausea and emesis.  No diarrhea.  Patient admits to continued alcohol use and states that he drank last night.  Also reported chest discomfort attributed to the upper abdominal pain.  Pain is not exertional and nonradiating.  No associated shortness of breath.  The history is provided by the patient.    Past Medical History Past Medical History:  Diagnosis Date   Alcohol abuse    Allergy    Anxiety    Arthritis    Diverticulosis of colon (without mention of hemorrhage)    GERD (gastroesophageal reflux disease)    Gout    History of kidney stones    Hyperlipemia    Hypertension    dr garrel  norins   Kidney stones    Nephrolithiasis    Pancreatitis    Hx of    Ureteral stent displacement (HCC) 1990   Patient Active Problem List   Diagnosis Date Noted   Morbid obesity (HCC) 11/05/2023   Chest pain of uncertain etiology 07/24/2019   Alcoholic pancreatitis 05/20/2018   Normal anion gap metabolic acidosis 05/20/2018   Radiculopathy 03/27/2018   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/02/2017   Renal stone 10/08/2016   Non-compliant behavior 12/02/2013   Routine health maintenance 01/27/2012   Hyperlipidemia 01/27/2012   Knee pain, left 11/12/2011   BPH (benign prostatic hyperplasia) 11/12/2011   GERD 08/10/2008   Essential hypertension 04/14/2008   Alcohol abuse 10/26/2007   TRIGGER FINGER, LEFT MIDDLE 05/12/2007   NEPHROLITHIASIS, HX OF 05/12/2007   Home Medication(s) Prior to Admission medications   Medication Sig Start Date End Date Taking? Authorizing  Provider  amLODipine  (NORVASC ) 10 MG tablet Take 1 tablet (10 mg total) by mouth daily. 11/05/23   [name], MD  atorvastatin  (LIPITOR) 40 MG tablet Take 1 tablet (40 mg total) by mouth daily. 11/05/23   [name], MD  carvedilol  (COREG ) 25 MG tablet Take 1 tablet (25 mg total) by mouth 2 (two) times daily with a meal. 11/05/23   [name], MD  chlordiazePOXIDE  (LIBRIUM ) 25 MG capsule 50mg  PO TID x 1D, then 25-50mg  PO BID X 1D, then 25-50mg  PO QD X 1D Patient not taking: Reported on 11/05/2023 10/17/23   Long, Fonda MATSU, MD  cyclobenzaprine  (FLEXERIL ) 10 MG tablet Take 1 tablet by mouth three times daily as needed for muscle spasm 08/09/21   [name], NP  losartan  (COZAAR ) 25 MG tablet Take 1 tablet (25 mg total) by mouth daily. 11/05/23   [name], MD  Melatonin 10 MG CAPS Take 10 mg by mouth at bedtime as needed. 01/10/22   [name], NP  Multiple Vitamins-Minerals (MULTIVITAMIN WITH MINERALS) tablet Take 1 tablet by mouth daily.    [provider]  omeprazole  (PRILOSEC) 40 MG capsule Take 1 capsule (40 mg total) by mouth daily. 10/03/23   [name], NP  oxyCODONE -acetaminophen  (PERCOCET/ROXICET) 5-325 MG tablet Take 1 tablet by mouth every 6 (six) hours as needed for severe pain (pain score 7-10). 05/30/23   Logan Ubaldo NOVAK, PA-C  potassium chloride  SA (KLOR-CON  M)  20 MEQ tablet Take 1 tablet (20 mEq total) by mouth daily. 09/21/21   Abran Norleen SAILOR, MD  Rimegepant Sulfate (NURTEC) 75 MG TBDP Take 1 tablet (75 mg total) by mouth daily as needed (migraine headache). Patient not taking: Reported on 11/05/2023 10/17/23   Long, Fonda MATSU, MD  rizatriptan  (MAXALT -MLT) 10 MG disintegrating tablet Take 1 tablet (10 mg total) by mouth as needed for migraine. May repeat in 2 hours if needed Patient not taking: Reported on 11/05/2023 09/19/20   Penumalli, Vikram R, MD  Tetrahydrozoline HCl (VISINE OP) Place 1 drop into both eyes daily as needed (redness).     [provider]  triamcinolone  cream (KENALOG ) 0.1 % Apply 1 application topically 2 (two) times daily. Patient not taking: Reported on 11/05/2023 09/08/19   Arloa Suzen RAMAN, NP  diphenhydrAMINE  (BENADRYL ) 25 MG tablet Take 1 tablet (25 mg total) by mouth every 6 (six) hours as needed. 04/04/20 06/17/20  Armenta Canning, MD  metoCLOPramide  (REGLAN ) 10 MG tablet Take 1 tablet (10 mg total) by mouth every 6 (six) hours as needed for nausea (nausea/headache). Take this tablet with 25 mg of Benadryl  and two extra strength Tylenol  for headache. 04/27/20 06/17/20  [name], NP                                                                                                                                    Allergies Hydrocodone -acetaminophen  and Shrimp [shellfish allergy]  Review of Systems Review of Systems As noted in HPI  Physical Exam Vital Signs  I have reviewed the triage vital signs BP (!) 155/98   Pulse 93   Temp 97.9 F (36.6 C) (Oral)   Resp (!) 21   Wt 120 kg   SpO2 100%   BMI 35.88 kg/m   Physical Exam Vitals reviewed.  Constitutional:      General: He is not in acute distress.    Appearance: He is well-developed. He is not diaphoretic.  HENT:     Head: Normocephalic and atraumatic.     Nose: Nose normal.  Eyes:     General: No scleral icterus.       Right eye: No discharge.        Left eye: No discharge.     Conjunctiva/sclera: Conjunctivae normal.     Pupils: Pupils are equal, round, and reactive to light.  Cardiovascular:     Rate and Rhythm: Normal rate and regular rhythm.     Heart sounds: No murmur heard.    No friction rub. No gallop.  Pulmonary:     Effort: Pulmonary effort is normal. No respiratory distress.     Breath sounds: Normal breath sounds. No stridor. No rales.  Abdominal:     General: There is distension.     Palpations: Abdomen is soft.     Tenderness: There is generalized abdominal tenderness (mostly epigastric and LUQ).   Musculoskeletal:  General: No tenderness.     Cervical back: Normal range of motion and neck supple.  Skin:    General: Skin is warm and dry.     Findings: No erythema or rash.  Neurological:     Mental Status: He is alert and oriented to person, place, and time.     ED Results and Treatments Labs (all labs ordered are listed, but only abnormal results are displayed) Labs Reviewed  LIPASE, BLOOD - Abnormal; Notable for the following components:      Result Value   Lipase 1,256 (*)    All other components within normal limits  COMPREHENSIVE METABOLIC PANEL WITH GFR - Abnormal; Notable for the following components:   CO2 17 (*)    Glucose, Bld 136 (*)    BUN 27 (*)    AST 53 (*)    Alkaline Phosphatase 36 (*)    Total Bilirubin 1.7 (*)    Anion gap 18 (*)    All other components within normal limits  CBC - Abnormal; Notable for the following components:   WBC 16.1 (*)    All other components within normal limits  URINALYSIS, ROUTINE W REFLEX MICROSCOPIC - Abnormal; Notable for the following components:   APPearance HAZY (*)    Specific Gravity, Urine >1.046 (*)    Hgb urine dipstick SMALL (*)    Protein, ur 100 (*)    All other components within normal limits  ETHANOL  I-STAT CG4 LACTIC ACID, ED  TROPONIN I (HIGH SENSITIVITY)  TROPONIN I (HIGH SENSITIVITY)                                                                                                                         EKG    Radiology CT ABDOMEN PELVIS W CONTRAST Result Date: 11/12/2023 CLINICAL DATA:  Severe pancreatitis symptoms with previous history of pancreatitis. EXAM: CT ABDOMEN AND PELVIS WITH CONTRAST TECHNIQUE: Multidetector CT imaging of the abdomen and pelvis was performed using the standard protocol following bolus administration of intravenous contrast. RADIATION DOSE REDUCTION: This exam was performed according to the departmental dose-optimization program which includes automated exposure  control, adjustment of the mA and/or kV according to patient size and/or use of iterative reconstruction technique. CONTRAST:  OMNIPAQUE  IOHEXOL  350 MG/ML SOLN COMPARISON:  CT without contrast 01/12/2021, CT with contrast 05/19/2018. FINDINGS: Lower chest: Lung bases are clear. The cardiac size is normal. Small hiatal hernia. There are 2-vessel coronary calcifications in the LAD and right coronary arteries. Hepatobiliary: The liver is 22 cm in length with mild-to-moderate steatosis. There is no mass enhancement. No cirrhotic surface changes to the liver contour. Gallbladder and bile ducts are unremarkable. Pancreas: There is generalized pancreatic edema and swelling consistent with acute interstitial pancreatitis. There is moderate peripancreatic inflammatory reaction with nonlocalized fluid extending into the hypogastrium, perigastrium and lesser sac, and surrounding the pancreas and layering over the bilateral Gerota's fascia with small amount collecting in the pelvis. The fluid is low in  density not suspicious for hemorrhage. There is no free air or abscess. There is no pancreatic mass enhancement or ductal dilatation. Spleen: No abnormality. Adrenals/Urinary Tract: There is a 2 cm Bosniak 1 cyst of the medial upper pole right kidney, Hounsfield density is 17. There are additional scattered occasional bilateral tiny Bosniak 2 cortical cysts which are too small to characterize. No follow-up imaging is recommended. JACR 2018 Feb; 264-273, Management of the Incidental Renal Mass on CT, RadioGraphics 2021; 814-848, Bosniak Classification of Cystic Renal Masses, Version 2019. There is no adrenal or renal mass enhancement. No urinary stone or obstruction is seen. There is symmetric excretion on the delayed images. The bladder is unremarkable for the degree of distension. Stomach/Bowel: There are thickened folds in the duodenum, probably reactive due to the pancreatitis or could be coexisting duodenitis. Rest of  the small bowel and gastric wall are unremarkable. Normal caliber appendix. The cecum is mobile located in the anterior right mid abdomen. There is diffuse diverticulosis without evidence of acute diverticulitis. Vascular/Lymphatic: Aortic atherosclerosis. No enlarged abdominal or pelvic lymph nodes. Multiple pelvic phleboliths. Reproductive: Prostate is unremarkable. Other: There are small umbilical and inguinal fat hernias. No incarcerated hernia. No free hemorrhage, free air or abscess. Musculoskeletal: L4-5 bilateral dorsal fusion rods and pedicle screws are again noted with solid interbody fusion. Mild degenerative change of the spine is also seen. IMPRESSION: 1. Acute interstitial pancreatitis with moderate peripancreatic inflammatory reaction and nonlocalized patchy fluid extending into the hypogastrium, perigastrium and lesser sac, and layering over the bilateral Gerota's fascia with small amount collecting in the pelvis. No free air or abscess. 2. Thickened folds in the duodenum probably reactive due to the pancreatitis or could be coexisting duodenitis. 3. Diverticulosis without evidence of diverticulitis. 4. Aortic and coronary artery atherosclerosis. 5. Small hiatal hernia. 6. Umbilical and inguinal fat hernias. 7. Bosniak 1 and 2 renal cysts. Aortic Atherosclerosis (ICD10-I70.0). Electronically Signed   By: Francis Quam M.D.   On: 11/12/2023 02:05   DG Chest Port 1 View Result Date: 11/12/2023 CLINICAL DATA:  Chest pain and abdominal pain. EXAM: PORTABLE CHEST 1 VIEW COMPARISON:  01/12/2021 FINDINGS: Shallow inspiration. Heart size and pulmonary vascularity are normal for technique. Lungs are clear. No pleural effusion or pneumothorax. Mediastinal contours appear intact. Calcification of the aorta. IMPRESSION: No active disease. Electronically Signed   By: Elsie Gravely M.D.   On: 11/12/2023 01:11    Medications Ordered in ED Medications  sodium chloride  0.9 % bolus 1,000 mL (0 mLs  Intravenous Stopped 11/12/23 0200)    Followed by  sodium chloride  0.9 % bolus 1,000 mL (0 mLs Intravenous Stopped 11/12/23 0200)    Followed by  0.9 %  sodium chloride  infusion (has no administration in time range)  HYDROmorphone  (DILAUDID ) injection 0.5 mg (has no administration in time range)  acetaminophen  (TYLENOL ) tablet 650 mg (650 mg Oral Given 11/11/23 1936)  ondansetron  (ZOFRAN -ODT) disintegrating tablet 4 mg (4 mg Oral Given 11/11/23 2059)  HYDROmorphone  (DILAUDID ) injection 1 mg (1 mg Intravenous Given 11/12/23 0121)  metoCLOPramide  (REGLAN ) injection 10 mg (10 mg Intravenous Given 11/12/23 0119)  iohexol  (OMNIPAQUE ) 350 MG/ML injection 100 mL (100 mLs Intravenous Contrast Given 11/12/23 0137)   Procedures Procedures  (including critical care time) Medical Decision Making / ED Course   Medical Decision Making Amount and/or Complexity of Data Reviewed Labs: ordered. Decision-making details documented in ED Course. Radiology: ordered and independent interpretation performed. Decision-making details documented in ED Course. ECG/medicine tests: ordered and independent interpretation  performed. Decision-making details documented in ED Course.  Risk Prescription drug management. Parenteral controlled substances. Decision regarding hospitalization.    Upper abdominal pain and chest pain.  Differential diagnosis considered.  Workup below.  EKG without acute ischemic changes or evidence of pericarditis.  Troponins negative.  Unlikely ACS.  Unlikely pulmonary embolism.  Doubt dissection.  Chest x-ray without evidence of pneumonia, pneumothorax, pulmonary edema pleural effusion.  No pneumomediastinum concerning for esophageal perforation.  Workup is revealing for pancreatitis with lipase greater than thousand and CT scan confirming.  Patient started on IV fluids, pain medicine and nausea medicine and admitted to medicine for further workup and management    Final Clinical Impression(s) / ED  Diagnoses Final diagnoses:  Alcohol-induced acute pancreatitis without infection or necrosis    This chart was dictated using voice recognition software.  Despite best efforts to proofread,  errors can occur which can change the documentation meaning.    Trine Raynell Moder, MD 11/12/23 [phone]

## 2023-11-12 NOTE — ED Notes (Signed)
 CCMD called, pt moved

## 2023-11-12 NOTE — [facility] Notes (Signed)
 Patient arrived to room. Alert and oriented x4. VSS. Call bell within reach and bed alarm on.

## 2023-11-12 NOTE — Discharge Instructions (Signed)
   Outpatient Substance Abuse  Treatment- uninsured  Narcotics Anonymous 24-HOUR HELPLINE Pre-recorded for Meeting Schedules [facility] [phone]  [email or web address] ALCOHOLICS ANONYMOUS  [facility]  Answering Service [phone] Please Note: All [place] Meetings are Non-smoking [email or web address]  Alcohol and Drug Services -  Insurance: Medicaid /State funding/private insurance Methadone, suboxone/Intensive outpatient  [place] [phone] Fax: [phone] [address], [place], [place], [address] [place] [phone] Fax: [phone]    [address] [place], [place], [address] ([address] [place], [place], [place], [place], [place], [place], [place], [place]) [facility] [email or web address] Accepts State funding/Medicaid Transitional housing, Intensive Outpatient Treatment, Outpatient treatment, Veterans Services  Phone: [phone] Fax: [phone] Address: [address], [place] [address]   [employer] [facility] ([email or web address]/) Insurance: Medicaid Case Management, Administrator, arts, Medication Management, Outpatient Therapy, Psychosocial Rehabilitation, Substance Abuse Intensive Outpatient  Phone: [phone] Fax: [phone], [place], [place], [address]  [facility] Medicaid, most private insurance providers Types of Program: Individual/Group Therapy, Substance Abuse Treatment  Phone: [place] [phone] Fax: [phone] [address], [place], [place], [address] [place] [phone] [address], [address] [place], [place], [address]  [facility]  Medicaid Types of Program: SAIOP  Phone: [phone] Fax: [phone] [address] [place], [place], [place], [address] [facility] Medicaid/state funds Crisis line [phone] [place] [phone] [place] [phone] [place] [phone]  [facility] [address];  [place], [place] [address] [phone]  Substance Abuse Intensive Outpatient Program [facility] [address] [place], [place] [address] [phone] Substance abuse treatment  Successful Transitions  Insurance: [facility], [address], sliding scale Types of Program: substance abuse treatment, transportation assistance Phone: [phone] Fax: [phone] Address: [address], [address], [place] [address] [facility] ([email or web address].[email or web address]) Insurance: UHC, [place], [place], [place] Program: addiction counseling, detoxification,  Phone: [phone]  Fax: [phone] Address: [address] [place], [place] [address]  [facility] (statewide facilities/programs) [address] (Medicaid/state funds) [place], [place] [address]                      http://[email or web address]/ [phone] [name]- [phone] [place]- [phone] [facility] [place] (2 Locations) (Medicaid/state funds) --[address]  walk in 8:30-12 and 1-2:30 [place], [id number]   [facility]- [phone] --[address] [place], [place] [address]  [phone] walk in 8:30-12 and 2-3:30  [facility] state funds/medicaid [address] [place], [place] [address] [phone] [facility] (Suboxone clinic) Medicaid/state funds  [address]  [place], [place] [address] [phone]   [facility]  [address] [place], [place] [address]  [phone] (24 hours) [place]- [address] [place], [place] [address]  [phone] (24 hours) [place]- [address] [place] [phone] [place]- [address] Rosalita Levan [phone] Turner Daniels [address] [place] [phone] [facility]- Medicaid and state funds  [place]- [address] [place], [place] [address] [phone] (24 hours) [place]- [address] [place], [place]  [phone] [facility]- [address] [place], [place] [address] [phone] (24 hours) [place] [address] [place], [place]  [address] [phone] [place]- [address] [place] [phone]

## 2023-11-12 NOTE — Hospital Course (Addendum)
 63 y.o. male with hx of alcohol use disorder with history of recurrent alcohol related pancreatitis, hypertension, hyperlipidemia, who presents with worsening upper abdominal pain.  Reports onset of symptoms yesterday morning after of night of heavy drinking the night before.  He is drinking about a pint of liquor or sixpack of beer per day.  Yesterday morning developed sudden onset of a band of upper abdominal pain which has been persistent since onset, not relieved with Tylenol  at home.  Pain remains severe despite dilaudid  in the ED. He has had nausea and vomiting with dark, questionable coffee-ground emesis.  Has also had recent dark stools.  Reports previously going to rehab for alcohol use but relapsed, he is interested in quitting drinking again and interested in medication assisted treatment for alcohol use disorder.  GI is following and is being managed conservatively with acute pancreatitis, complicated by ileus. Diet slowly advanced so far tolerating electrolytes has improved. Once able to tolerate diet plan for discharge home  Subjective: Patient seen examined this morning He denies nausea and vomiting Overnight afebrile BP fairly okay in 150s  Labs reviewed electrolytes as normalized He is on full liquid diet, having bowel movement last 7/14, eager to advance diet  Discharge diagnosis *** assessment and plan:  Alcoholic pancreatitis complicated by ileus Lactic acidosis Transaminitis likely alcohol related Hepatomegaly on imaging, hepatic steatosis: Patient with significant alcohol abuse binge drinking, likely the etiology of his underlying lactic acidosis transaminitis pancreatitis and dehydration AND BEING MAANGED conservatively with aggressive IV fluid hydration, antiemetics, PPI, IV pain management.  Will provide liquid diet, will advance diet, having bowel movement overall clinically improving> diet being advanced to low fat diet if tolerates she can go home  Reported black  stools/coffee-ground emesis  ?Duodenitis, or gastritis, alcohol related: No evidence of bleeding, continue PPI. Hb stable  Severe alcohol use disorder, at risk of withdrawal: Drinking 1 pint of liquor, or sixpack per day. Previous success with rehabilitation program outpatient but relapsed. Appears motivated to quit TOC consulted continue on CIWA scale thiamine  multivitamin folate.  Hypokalemia Hypomagnesemia: Electrolytes have normalized, continue IVF with potassium.  Monitor  Recent Labs  Lab 11/15/23 0645 11/16/23 0703 11/17/23 0818 11/18/23 0705 11/18/23 2034  K 3.7 3.2* 2.7* 2.7* 3.7    Hypertension: BP was uncontrolled, Resumed amlodipine  Coreg  7/14 AND IMPROVING.hold off on losartan   Hyperlipidemia: continue home atorvastatin   Incidental findings: Aortic, coronary atherosclerosis Bosniak 2 renal cyst Microscopic hematuria Follow-up with PCP

## 2023-11-12 NOTE — ED Notes (Signed)
 CCMD contacted to admit the patient for cardiac monitoring.

## 2023-11-12 NOTE — ED Notes (Signed)
 Pt transported to CT ?

## 2023-11-12 NOTE — Consult Note (Addendum)
 [facility] Consult Note   History Jacobe Study MRN # [id number]  Date of Admission: 11/11/2023 Date of Consultation: 11/12/2023 Referring physician: Dr. Christobal Guadalajara, MD Primary Care Provider: [name], NP Primary Gastroenterologist: Dr. Norleen Kiang   Reason for Consultation/Chief Complaint: Acute pancreatitis abnormal vomitus (question upper GI bleeding)  Subjective  HPI:  This is a 63 year old man with a history of alcohol-related pancreatitis, last ED visit or hospitalization for which appears to have been in May 2023 when he was consuming alcohol heavily.  He saw Dr. Kiang in the office shortly after that because there is also been a question of coffee-ground's emesis and black stool (possibly melena).  At that point he reported having ceased alcohol intake shortly afterward underwent EGD and colonoscopy.  These were essentially unremarkable studies from the standpoint of bleeding.  There was an incidental small hiatal hernia and nonobstructing Schatzki ring on the EGD, and there was a diminutive adenomatous polyp, diverticulosis and internal hemorrhoids on the colonoscopy.  He has lately been drinking alcohol heavily with both liquor and beer and yesterday had the acute onset of severe upper abdominal pain with nausea.  He took some OTC remedies including Pepto-Bismol and at 1 point had some vomitus that was reportedly dark-colored.  He says his stools have also been dark lately, though not black and tarry and no bright red blood per rectum.  He is found to have acute pancreatitis during this ED visit with an elevated lipase and a CT abdomen and pelvis showing pancreatic inflammation with some adjacent fluid tracking into the retroperitoneum.  He continues to have abdominal pain and (to a lesser degree) nausea.  ROS:   All other systems are negative except as noted above in the HPI  Past Medical History Past Medical History:  Diagnosis Date   Alcohol abuse     Allergy    Anxiety    Arthritis    Diverticulosis of colon (without mention of hemorrhage)    GERD (gastroesophageal reflux disease)    Gout    History of kidney stones    Hyperlipemia    Hypertension    dr garrel  norins   Kidney stones    Nephrolithiasis    Pancreatitis    Hx of    Ureteral stent displacement (HCC) 1990    Past Surgical History Past Surgical History:  Procedure Laterality Date   A2 pulley flexor sheath cyst 4th finger excisional biopsy     '03   ANTERIOR CRUCIATE LIGAMENT REPAIR     R knee   ESOPHAGOGASTRODUODENOSCOPY N/A 07/09/2012   Procedure: ESOPHAGOGASTRODUODENOSCOPY (EGD);  Surgeon: Lamar JONETTA Aho, MD;  Location: [facility];  Service: Endoscopy;  Laterality: N/A;   KNEE SURGERY  2008   LEFT HEART CATH AND CORONARY ANGIOGRAPHY N/A 08/10/2019   Procedure: LEFT HEART CATH AND CORONARY ANGIOGRAPHY;  Surgeon: [name], MD;  Location: [facility];  Service: Cardiovascular;  Laterality: N/A;   SHOULDER ARTHROSCOPY WITH SUBACROMIAL DECOMPRESSION AND OPEN ROTATOR C Right 12/26/2012   Procedure: RIGHT SHOULDER ARTHROSCOPY WITH SUBACROMIAL DECOMPRESSION MINI OPEN ROTATOR CUFF REPAIR, OPEN BICEP TENODESIS OPEN DISTAL CLAVICLE RESECTION  ;  Surgeon: Elspeth JONELLE Her, MD;  Location: [facility];  Service: Orthopedics;  Laterality: Right;   TRANSFORAMINAL LUMBAR INTERBODY FUSION (TLIF) WITH PEDICLE SCREW FIXATION 1 LEVEL Right 03/27/2018   Procedure: RIGHT-SIDED LUMBAR 4-5 TRANSFORAMINAL LUMBAR INTERBODY FUSION WITH INSTRUMENTATION AND ALLOGRAFT;  Surgeon: Beuford Anes, MD;  Location: [facility];  Service: Orthopedics;  Laterality:  Right;   URETEROSCOPY     and stone retreval    Family History Family History  Problem Relation Age of Onset   Diabetes Mother        x2 brothers   Hypertension Father        X4 siblings   Heart disease Father    Colon cancer Neg Hx    Esophageal cancer Neg Hx     Social History Social History   Socioeconomic History    Marital status: Legally Separated    Spouse name: Not on file   Number of children: 3   Years of education: Not on file   Highest education level: Not on file  Occupational History    Employer: [employer]    Comment: Pharmacologist , retired  Tobacco Use   Smoking status: Former    Current packs/day: 0.00    Types: Cigarettes    Quit date: 07/10/1998    Years since quitting: 25.3   Smokeless tobacco: Never  Vaping Use   Vaping status: Never Used  Substance and Sexual Activity   Alcohol use: Yes    Alcohol/week: 12.0 standard drinks of alcohol    Types: 6 Cans of beer, 6 Shots of liquor per week    Comment: 1 pint las night   Drug use: No   Sexual activity: Yes  Other Topics Concern   Not on file  Social History Narrative   HSG   [employer] groundskeeper   Difficult domestic situation, Married '87- separated '01   2 sons- '82, '91; 1 daughter '91   His niece is 67 with metastatic breast cancer (feb '12)   Social Drivers of Corporate investment banker Strain: Not on file  Food Insecurity: No Food Insecurity (10/29/2023)   Hunger Vital Sign    Worried About Running Out of Food in the Last Year: Never true    Ran Out of Food in the Last Year: Never true  Transportation Needs: No Transportation Needs (10/29/2023)   PRAPARE - Administrator, Civil Service (Medical): No    Lack of Transportation (Non-Medical): No  Physical Activity: Not on file  Stress: Not on file  Social Connections: Not on file    Allergies Allergies  Allergen Reactions   Hydrocodone -Acetaminophen  Palpitations   Shrimp [Shellfish Allergy] Other (See Comments)    Cholesterol level becomes very high. Patient reports sweating and itching.    Outpatient Meds Home medications from the H+P and/or nursing med reconciliation reviewed.  Inpatient med list reviewed  _____________________________________________________________________ Objective   Exam:  Current vital signs  Patient  Vitals for the past 8 hrs:  BP Temp Temp src Pulse Resp SpO2  11/12/23 1035 (!) 158/93 -- -- (!) 113 19 98 %  11/12/23 0857 -- 98.6 F (37 C) Oral -- -- --  11/12/23 0700 (!) 163/105 -- -- (!) 114 20 98 %  11/12/23 0600 (!) 162/104 -- -- (!) 104 18 99 %  11/12/23 0545 (!) 151/107 -- -- (!) 113 20 96 %  11/12/23 0440 -- 99.1 F (37.3 C) Oral -- -- --  11/12/23 0330 120/65 -- -- (!) 106 19 98 %  11/12/23 0315 (!) 155/114 -- -- 99 20 98 %    Intake/Output Summary (Last 24 hours) at 11/12/2023 1100 Last data filed at 11/12/2023 0200 Gross per 24 hour  Intake 2000 ml  Output --  Net 2000 ml    Physical Exam:   General: this is  a pleasant male patient who is alert conversational and appropriate.  He is visibly uncomfortable looking abdominal pain in the ED stretcher, though he is nontoxic-appearing. Eyes: sclera anicteric, no redness ENT: oral mucosa dry and without lesions, no cervical or supraclavicular lymphadenopathy CV: RRR (tachycardic 110) without appreciable murmur, S1/S2, no JVD,, no peripheral edema, good distal pulses Resp: clear to auscultation bilaterally, normal RR and effort noted GI: soft, diffuse upper abdominal tenderness, with active bowel sounds of normal character. No guarding or palpable organomegaly noted.  Reducible umbilical hernia Skin; warm and dry, no rash or jaundice noted Neuro: awake, alert and oriented x 3. Normal gross motor function and fluent speech.  Labs:     Latest Ref Rng & Units 11/11/2023    6:58 PM 10/16/2023    6:19 PM 10/12/2022    6:31 PM  CBC  WBC 4.0 - 10.5 K/uL 16.1  8.8  10.9   Hemoglobin 13.0 - 17.0 g/dL 86.0  86.9  87.8   Hematocrit 39.0 - 52.0 % 41.0  38.8  37.9   Platelets 150 - 400 K/uL 222  190  216        Latest Ref Rng & Units 11/11/2023    6:58 PM 10/16/2023    6:19 PM 10/12/2022    6:31 PM  CMP  Glucose 70 - 99 mg/dL 863  880  881   BUN 8 - 23 mg/dL 27  16  19    Creatinine 0.61 - 1.24 mg/dL 8.84  8.81  8.57   Sodium 135 -  145 mmol/L 139  140  139   Potassium 3.5 - 5.1 mmol/L 3.9  3.3  3.7   Chloride 98 - 111 mmol/L 104  105  104   CO2 22 - 32 mmol/L 17  22  23    Calcium  8.9 - 10.3 mg/dL 9.2  9.0  9.4   Total Protein 6.5 - 8.1 g/dL 7.3     Total Bilirubin 0.0 - 1.2 mg/dL 1.7     Alkaline Phos 38 - 126 U/L 36     AST 15 - 41 U/L 53     ALT 0 - 44 U/L 31       No results for input(s): INR in the last 168 hours. _________________________________________________________ Radiologic studies: CLINICAL DATA:  Severe pancreatitis symptoms with previous history of pancreatitis.   EXAM: CT ABDOMEN AND PELVIS WITH CONTRAST   TECHNIQUE: Multidetector CT imaging of the abdomen and pelvis was performed using the standard protocol following bolus administration of intravenous contrast.   RADIATION DOSE REDUCTION: This exam was performed according to the departmental dose-optimization program which includes automated exposure control, adjustment of the mA and/or kV according to patient size and/or use of iterative reconstruction technique.   CONTRAST:  OMNIPAQUE  IOHEXOL  350 MG/ML SOLN   COMPARISON:  CT without contrast 01/12/2021, CT with contrast 05/19/2018.   FINDINGS: Lower chest: Lung bases are clear. The cardiac size is normal. Small hiatal hernia. There are 2-vessel coronary calcifications in the LAD and right coronary arteries.   Hepatobiliary: The liver is 22 cm in length with mild-to-moderate steatosis. There is no mass enhancement.   No cirrhotic surface changes to the liver contour. Gallbladder and bile ducts are unremarkable.   Pancreas: There is generalized pancreatic edema and swelling consistent with acute interstitial pancreatitis.   There is moderate peripancreatic inflammatory reaction with nonlocalized fluid extending into the hypogastrium, perigastrium and lesser sac, and surrounding the pancreas and layering over the bilateral Gerota's fascia  with small amount collecting  in the pelvis.   The fluid is low in density not suspicious for hemorrhage. There is no free air or abscess.   There is no pancreatic mass enhancement or ductal dilatation.   Spleen: No abnormality.   Adrenals/Urinary Tract: There is a 2 cm Bosniak 1 cyst of the medial upper pole right kidney, Hounsfield density is 17.   There are additional scattered occasional bilateral tiny Bosniak 2 cortical cysts which are too small to characterize.   No follow-up imaging is recommended. JACR 2018 Feb; 264-273, Management of the Incidental Renal Mass on CT, [employer] 2021; 814-848, Bosniak Classification of Cystic Renal Masses, Version 2019.   There is no adrenal or renal mass enhancement. No urinary stone or obstruction is seen. There is symmetric excretion on the delayed images.   The bladder is unremarkable for the degree of distension.   Stomach/Bowel: There are thickened folds in the duodenum, probably reactive due to the pancreatitis or could be coexisting duodenitis.   Rest of the small bowel and gastric wall are unremarkable. Normal caliber appendix.   The cecum is mobile located in the anterior right mid abdomen. There is diffuse diverticulosis without evidence of acute diverticulitis.   Vascular/Lymphatic: Aortic atherosclerosis. No enlarged abdominal or pelvic lymph nodes. Multiple pelvic phleboliths.   Reproductive: Prostate is unremarkable.   Other: There are small umbilical and inguinal fat hernias. No incarcerated hernia. No free hemorrhage, free air or abscess.   Musculoskeletal: L4-5 bilateral dorsal fusion rods and pedicle screws are again noted with solid interbody fusion. Mild degenerative change of the spine is also seen.   IMPRESSION: 1. Acute interstitial pancreatitis with moderate peripancreatic inflammatory reaction and nonlocalized patchy fluid extending into the hypogastrium, perigastrium and lesser sac, and layering over the bilateral Gerota's  fascia with small amount collecting in the pelvis. No free air or abscess. 2. Thickened folds in the duodenum probably reactive due to the pancreatitis or could be coexisting duodenitis. 3. Diverticulosis without evidence of diverticulitis. 4. Aortic and coronary artery atherosclerosis. 5. Small hiatal hernia. 6. Umbilical and inguinal fat hernias. 7. Bosniak 1 and 2 renal cysts.   Aortic Atherosclerosis (ICD10-I70.0).     Electronically Signed   By: Francis Quam M.D.   On: 11/12/2023 02:05  ______________________________________________________ Other studies:   _______________________________________________________ Assessment & Plan  Impression:  Upper abdominal pain and nausea from acute alcohol-related pancreatitis with elevated lipase and pancreatic imaging on CT abdomen and pelvis.  Elevated LFTs-modestly elevated AST-suspect related to alcohol  Question of upper GI bleeding, though it is not clear that is occurring.  He felt nauseated and had what sounds of a single episode of some darker vomitus after having taken Pepto-Bismol.  He may have alcohol related gastropathy ulcer possible but seems less likely.  He does take a daily 81 mg aspirin  and occasional NSAIDs as well as Tylenol  for back and other orthopedic pain. He does not appear to be having adverse GI bleed if one is occurring at all, and says his last BM was yesterday morning.  Hemoglobin 13.9, should be checked daily.  Anticipate it will decline somewhat with IV fluid administration. Not yet clear if upper endoscopy will be needed this admission, but we will follow him and monitor for improvement in the pancreatitis as well as any possible GI bleeding and make further recommendations and plans. Plan:  He is getting IV fluids at present including a 500 cc bolus on top of his maintenance fluids.  Of  note, pancreatitis patient's should be treated with LR rather than normal saline  As needed antiemetics and  judicious use of opioids.  Currently does not have an ileus.  DVT prophylaxis  N.p.o. except ice chips and water for now  He needs assistance with treatment for his alcohol use disorder.  We will follow    Thank you for the courtesy of this consult.  Please contact me with any questions or concerns.  Victory LITTIE Brand III Office: [phone]

## 2023-11-12 NOTE — ED Notes (Signed)
 CCMD called.

## 2023-11-13 DIAGNOSIS — K852 Alcohol induced acute pancreatitis without necrosis or infection: Secondary | ICD-10-CM | POA: Diagnosis not present

## 2023-11-13 DIAGNOSIS — R1010 Upper abdominal pain, unspecified: Secondary | ICD-10-CM

## 2023-11-13 DIAGNOSIS — E876 Hypokalemia: Secondary | ICD-10-CM

## 2023-11-13 LAB — HEPATITIS C ANTIBODY: HCV Ab: NONREACTIVE

## 2023-11-13 LAB — CBC
HCT: 39.9 % (ref 39.0–52.0)
Hemoglobin: 13.4 g/dL (ref 13.0–17.0)
MCH: 31.7 pg (ref 26.0–34.0)
MCHC: 33.6 g/dL (ref 30.0–36.0)
MCV: 94.3 fL (ref 80.0–100.0)
Platelets: 151 K/uL (ref 150–400)
RBC: 4.23 MIL/uL (ref 4.22–5.81)
RDW: 13 % (ref 11.5–15.5)
WBC: 19.2 K/uL — ABNORMAL HIGH (ref 4.0–10.5)
nRBC: 0 % (ref 0.0–0.2)

## 2023-11-13 LAB — HEPATIC FUNCTION PANEL
ALT: 21 U/L (ref 0–44)
AST: 24 U/L (ref 15–41)
Albumin: 3.3 g/dL — ABNORMAL LOW (ref 3.5–5.0)
Alkaline Phosphatase: 41 U/L (ref 38–126)
Bilirubin, Direct: 0.2 mg/dL (ref 0.0–0.2)
Indirect Bilirubin: 1.5 mg/dL — ABNORMAL HIGH (ref 0.3–0.9)
Total Bilirubin: 1.7 mg/dL — ABNORMAL HIGH (ref 0.0–1.2)
Total Protein: 6.4 g/dL — ABNORMAL LOW (ref 6.5–8.1)

## 2023-11-13 LAB — BASIC METABOLIC PANEL WITH GFR
Anion gap: 10 (ref 5–15)
BUN: 10 mg/dL (ref 8–23)
CO2: 23 mmol/L (ref 22–32)
Calcium: 7.5 mg/dL — ABNORMAL LOW (ref 8.9–10.3)
Chloride: 106 mmol/L (ref 98–111)
Creatinine, Ser: 1 mg/dL (ref 0.61–1.24)
GFR, Estimated: >60 mL/min (ref >60)
Glucose, Bld: 139 mg/dL — ABNORMAL HIGH (ref 70–99)
Potassium: 3 mmol/L — ABNORMAL LOW (ref 3.5–5.1)
Sodium: 139 mmol/L (ref 135–145)

## 2023-11-13 LAB — HIV ANTIBODY (ROUTINE TESTING W REFLEX): HIV Screen 4th Generation wRfx: NONREACTIVE

## 2023-11-13 LAB — PHOSPHORUS: Phosphorus: 2.6 mg/dL (ref 2.5–4.6)

## 2023-11-13 LAB — MAGNESIUM: Magnesium: 1.4 mg/dL — ABNORMAL LOW (ref 1.7–2.4)

## 2023-11-13 LAB — PROTIME-INR
INR: 1.1 (ref 0.8–1.2)
Prothrombin Time: 14.3 s (ref 11.4–15.2)

## 2023-11-13 LAB — TYPE AND SCREEN
ABO/RH(D): AB POS
Antibody Screen: NEGATIVE

## 2023-11-13 LAB — HEPATITIS B SURFACE ANTIBODY,QUALITATIVE: Hep B S Ab: NONREACTIVE

## 2023-11-13 LAB — HEPATITIS B SURFACE ANTIGEN: Hepatitis B Surface Ag: NONREACTIVE

## 2023-11-13 MED ORDER — MAGNESIUM SULFATE 2 GM/50ML IV SOLN
2.0000 g | Freq: Once | INTRAVENOUS | Status: AC
  Administered 2023-11-13: 2 g via INTRAVENOUS
  Filled 2023-11-13: qty 50

## 2023-11-13 MED ORDER — POTASSIUM CHLORIDE CRYS ER 20 MEQ PO TBCR
40.0000 meq | EXTENDED_RELEASE_TABLET | ORAL | Status: AC
  Administered 2023-11-13 (×2): 40 meq via ORAL
  Filled 2023-11-13 (×2): qty 2

## 2023-11-13 NOTE — Progress Notes (Signed)
 PROGRESS NOTE [name]  FMW:[id number] DOB: Jan 16, 1961 DOA: 11/11/2023 PCP: [name], NP  Brief Narrative/Hospital Course:  63 y.o. male with hx of alcohol use disorder with history of recurrent alcohol related pancreatitis, hypertension, hyperlipidemia, who presents with worsening upper abdominal pain.  Reports onset of symptoms yesterday morning after of night of heavy drinking the night before.  He is drinking about a pint of liquor or sixpack of beer per day.  Yesterday morning developed sudden onset of a band of upper abdominal pain which has been persistent since onset, not relieved with Tylenol  at home.  Pain remains severe despite dilaudid  in the ED. He has had nausea and vomiting with dark, questionable coffee-ground emesis.  Has also had recent dark stools.  Reports previously going to rehab for alcohol use but relapsed, he is interested in quitting drinking again and interested in medication assisted treatment for alcohol use disorder.   Subjective: Seen and examined Complains of similar abdominal discomfort and pain No tremors no confusion no agitation Overnight afebrile heart rate in low 100 BP 150s-106, Labs reviewed mild hypokalemia 3.0 with stable renal function and LFTs mag 1.4, CBC with leukocytosis  Assessment and plan:  Alcoholic pancreatitis Lactic acidosis Transaminitis likely alcohol related Hepatomegaly on imaging, hepatic steatosis: Patient with significant alcohol abuse binge drinking/heavy drinking, likely the etiology of his underlying lactic acidosis transaminitis pancreatitis and dehydration.  GI input appreciated continued aggressive IV fluid hydration, electrolytes LFTs.  Continue PPI antiemetics supportive care Carafate  Recent Labs  Lab 11/11/23 1858 11/13/23 0533  AST 53* 24  ALT 31 21  ALKPHOS 36* 41  BILITOT 1.7* 1.7*  BILIDIR  --  0.2  IBILI  --  1.5*  PROT 7.3 6.4*  ALBUMIN  4.3 3.3*  INR  --  1.1  LIPASE 1,256*  --   PLT 222 151       Latest Ref Rng & Units 11/13/2023    5:33 AM  Hepatitis  Hep B Surface Ag NON REACTIVE NON REACTIVE   Hep C Ab NON REACTIVE NON REACTIVE    MELD 3.0: 10 at 11/13/2023  5:33 AM MELD-Na: 10 at 11/13/2023  5:33 AM Calculated from: Serum Creatinine: 1 mg/dL at 2/0/7974  4:66 AM Serum Sodium: 139 mmol/L (Using max of 137 mmol/L) at 11/13/2023  5:33 AM Total Bilirubin: 1.7 mg/dL at 2/0/7974  4:66 AM Serum Albumin : 3.3 g/dL at 2/0/7974  4:66 AM INR(ratio): 1.1 at 11/13/2023  5:33 AM Age at listing (hypothetical): 69 years Sex: Male at 11/13/2023  5:33 AM   Suspect upper GI bleed, likely minor bleeding  Duodenitis, likely gastritis, alcohol related: GI input appreciated avoid NSAID aspirin  monitor CBC regularly continue PPI Recent Labs  Lab 11/11/23 1858 11/13/23 0533  HGB 13.9 13.4  HCT 41.0 39.9    Severe alcohol use disorder, at risk of withdrawal: Drinking 1 pint of liquor, or sixpack per day. Previous success with rehabilitation program outpatient but relapsed. Appears motivated to quit TOC consulted continue on CIWA scale thiamine  multivitamin folate  Incidental findings: Aortic, coronary atherosclerosis Bosniak 2 renal cyst Microscopic hematuria  Hypertension: BP stable, continue to hold amlodipine , losartan , carvedilol  in the setting of volume depletion and lactic acidosis.  Hypokalemia Hypomagnesemia: Replace.  Hyperlipidemia: continue home atorvastatin    Class I Obesity w/ Body mass index is 35.88 kg/m.: Will benefit with PCP follow-up, weight loss,healthy lifestyle and outpatient sleep eval if not done.  DVT prophylaxis: SCDs Start: 11/12/23 0711: Holding off on chemical prophylaxis will start if no  evidence of bleeding Code Status:   Code Status: Full Code Family Communication: plan of care discussed with patient at bedside. Patient status is: Remains hospitalized because of severity of illness Level of care: Telemetry Medical   Dispo: The patient is  from:Home            Anticipated disposition: TBD Objective: Vitals last 24 hrs: Vitals:   11/12/23 1424 11/12/23 2248 11/13/23 0507 11/13/23 0736  BP: (!) 168/106 (!) 151/96 (!) 153/94 (!) 151/106  Pulse: (!) 102 (!) 104 (!) 101   Resp:  18 18 17   Temp: 98.5 F (36.9 C) 98.5 F (36.9 C) 98.3 F (36.8 C) 98.3 F (36.8 C)  TempSrc: Oral Oral Oral   SpO2: 98% 98% 94% 91%  Weight:        Physical Examination: General exam: alert awake, older than stated age HEENT:Oral mucosa moist, Ear/Nose WNL grossly Respiratory system: Bilaterally clear BS, no use of accessory muscle Cardiovascular system: S1 & S2 +. Gastrointestinal system: Abdomen soft, distended mild tenderness present,BS+ Nervous System: Alert, awake, following commands. Extremities: LE edema neg, warm extremities Skin: No rashes,warm. MSK: Normal muscle bulk/tone.   Data Reviewed: I have personally reviewed following labs and imaging studies ( see epic result tab) CBC: Recent Labs  Lab 11/11/23 1858 11/13/23 0533  WBC 16.1* 19.2*  HGB 13.9 13.4  HCT 41.0 39.9  MCV 91.9 94.3  PLT 222 151   CMP: Recent Labs  Lab 11/11/23 1858 11/13/23 0533  NA 139 139  K 3.9 3.0*  CL 104 106  CO2 17* 23  GLUCOSE 136* 139*  BUN 27* 10  CREATININE 1.15 1.00  CALCIUM  9.2 7.5*  MG  --  1.4*  PHOS  --  2.6   GFR: Estimated Creatinine Clearance: 103.8 mL/min (by C-G formula based on SCr of 1 mg/dL). Recent Labs  Lab 11/11/23 1858 11/13/23 0533  AST 53* 24  ALT 31 21  ALKPHOS 36* 41  BILITOT 1.7* 1.7*  PROT 7.3 6.4*  ALBUMIN  4.3 3.3*    Recent Labs  Lab 11/11/23 1858  LIPASE 1,256*   No results for input(s): AMMONIA in the last 168 hours. Coagulation Profile:  Recent Labs  Lab 11/13/23 0533  INR 1.1   Unresulted Labs (From admission, onward)     Start     Ordered   11/14/23 0500  CBC  Daily,   R      11/13/23 0850   11/14/23 0500  Comprehensive metabolic panel with GFR  Daily,   R      11/13/23 0850    11/14/23 0500  Magnesium   Tomorrow morning,   R        11/13/23 0850   11/13/23 0500  Hepatitis B core antibody, total  Tomorrow morning,   R        11/12/23 0757           Antimicrobials/Microbiology: Anti-infectives (From admission, onward)    None         Component Value Date/Time   SDES URINE, RANDOM 06/17/2020 2010   SPECREQUEST  06/17/2020 2010    NONE Performed at [facility], [address], [place], [place] [address]    CULT >=100,000 COLONIES/mL ESCHERICHIA COLI (A) 06/17/2020 2010   REPTSTATUS 06/20/2020 FINAL 06/17/2020 2010    Procedures:  Medications reviewed: Scheduled Meds:  atorvastatin   40 mg Oral Daily   folic acid   1 mg Oral Daily   multivitamin with minerals  1 tablet Oral  Daily   pantoprazole  (PROTONIX ) IV  40 mg Intravenous Q12H   potassium chloride   40 mEq Oral Q3H   sodium chloride  flush  3 mL Intravenous Q12H   sucralfate   1 g Oral TID WC & HS   thiamine   100 mg Oral Daily   Or   thiamine   100 mg Intravenous Daily   Continuous Infusions:  sodium chloride  1,000 mL (11/12/23 2029)   magnesium  sulfate bolus IVPB      Hollis Oh, MD [facility] 11/13/2023, 10:57 AM

## 2023-11-13 NOTE — Progress Notes (Addendum)
 Daily Progress Note  DOA: 11/11/2023 Hospital Day: 3   Cc:  acute pancreatitis, ? GI bleed  Brief History:  63 y.o. year old male with a medical history including but not limited to alcohol related pancreatitis, hepatic steatosis, hypertension, hyperlipidemia  ASSESSMENT    63 year old male admitted with upper abdominal pain and acute pancreatitis. CT scan demonstrating  acute interstitial pancreatitis with moderate peripancreatic inflammatory reaction and nonlocalized patchy fluid extending into the hypogastrium, perigastrium and lesser sac, and layering over the bilateral Gerota's fascia with small amount collecting in the pelvis.  >>TODAY: Still having significant generalized abdominal and back pain. Nauseated right now but wants to something to eat, at least clear liquids.   Elevated LFTs ( on admission) / ETOH abuse Total bilirubin 1.7 . Pattern of LFT to ALT ratio suggestive of ETOH etiology >>TODAY: Bilirubin 1.7 but predominantly indirect ( 1.5) , liver enzymes have normalized  Reported black stool and coffee-ground emesis Resolved.  GI bleed?  On admission to volume depletion rather than GI bleed.  >>TODAY: Hemoglobin has remained stable at 13.4.  No vomiting today.  No BMs / overt GI bleeding  Hypokalemia / Low Mg+   Principal Problem:   Alcoholic pancreatitis   PLAN   --Continue twice daily PPI --Continue 4 times daily Carafate  --Electrolyte repletion in progress --Wants clear liquids but also having significant ongoing generalized abdominal pain --Asking for increase in pain meds. I will message Hospitalist.   Subjective   Continues to have abdominal abdominal pain feels nauseated does not vomiting.  Requesting an increase in pain meds.  No BMs or overt GI bleeding  Objective    Recent Labs    11/11/23 1858 11/13/23 0533  WBC 16.1* 19.2*  HGB 13.9 13.4  HCT 41.0 39.9  MCV 91.9 94.3  PLT 222 151   Recent Labs    11/11/23 1858  11/13/23 0533  NA 139 139  K 3.9 3.0*  CL 104 106  CO2 17* 23  GLUCOSE 136* 139*  BUN 27* 10  CREATININE 1.15 1.00  CALCIUM  9.2 7.5*   Recent Labs    11/11/23 1858 11/13/23 0533  PROT 7.3 6.4*  ALBUMIN  4.3 3.3*  AST 53* 24  ALT 31 21  ALKPHOS 36* 41  BILITOT 1.7* 1.7*  BILIDIR  --  0.2  IBILI  --  1.5*      Imaging:  CT ABDOMEN PELVIS W CONTRAST CLINICAL DATA:  Severe pancreatitis symptoms with previous history of pancreatitis.  EXAM: CT ABDOMEN AND PELVIS WITH CONTRAST  TECHNIQUE: Multidetector CT imaging of the abdomen and pelvis was performed using the standard protocol following bolus administration of intravenous contrast.  RADIATION DOSE REDUCTION: This exam was performed according to the departmental dose-optimization program which includes automated exposure control, adjustment of the mA and/or kV according to patient size and/or use of iterative reconstruction technique.  CONTRAST:  OMNIPAQUE  IOHEXOL  350 MG/ML SOLN  COMPARISON:  CT without contrast 01/12/2021, CT with contrast 05/19/2018.  FINDINGS: Lower chest: Lung bases are clear. The cardiac size is normal. Small hiatal hernia. There are 2-vessel coronary calcifications in the LAD and right coronary arteries.  Hepatobiliary: The liver is 22 cm in length with mild-to-moderate steatosis. There is no mass enhancement.  No cirrhotic surface changes to the liver contour. Gallbladder and bile ducts are unremarkable.  Pancreas: There is generalized pancreatic edema and swelling consistent with acute interstitial pancreatitis.  There is moderate peripancreatic inflammatory reaction with nonlocalized fluid extending into  the hypogastrium, perigastrium and lesser sac, and surrounding the pancreas and layering over the bilateral Gerota's fascia with small amount collecting in the pelvis.  The fluid is low in density not suspicious for hemorrhage. There is no free air or abscess.  There  is no pancreatic mass enhancement or ductal dilatation.  Spleen: No abnormality.  Adrenals/Urinary Tract: There is a 2 cm Bosniak 1 cyst of the medial upper pole right kidney, Hounsfield density is 17.  There are additional scattered occasional bilateral tiny Bosniak 2 cortical cysts which are too small to characterize.  No follow-up imaging is recommended. JACR 2018 Feb; 264-273, Management of the Incidental Renal Mass on CT, [employer] 2021; 814-848, Bosniak Classification of Cystic Renal Masses, Version 2019.  There is no adrenal or renal mass enhancement. No urinary stone or obstruction is seen. There is symmetric excretion on the delayed images.  The bladder is unremarkable for the degree of distension.  Stomach/Bowel: There are thickened folds in the duodenum, probably reactive due to the pancreatitis or could be coexisting duodenitis.  Rest of the small bowel and gastric wall are unremarkable. Normal caliber appendix.  The cecum is mobile located in the anterior right mid abdomen. There is diffuse diverticulosis without evidence of acute diverticulitis.  Vascular/Lymphatic: Aortic atherosclerosis. No enlarged abdominal or pelvic lymph nodes. Multiple pelvic phleboliths.  Reproductive: Prostate is unremarkable.  Other: There are small umbilical and inguinal fat hernias. No incarcerated hernia. No free hemorrhage, free air or abscess.  Musculoskeletal: L4-5 bilateral dorsal fusion rods and pedicle screws are again noted with solid interbody fusion. Mild degenerative change of the spine is also seen.  IMPRESSION: 1. Acute interstitial pancreatitis with moderate peripancreatic inflammatory reaction and nonlocalized patchy fluid extending into the hypogastrium, perigastrium and lesser sac, and layering over the bilateral Gerota's fascia with small amount collecting in the pelvis. No free air or abscess. 2. Thickened folds in the duodenum probably reactive due to  the pancreatitis or could be coexisting duodenitis. 3. Diverticulosis without evidence of diverticulitis. 4. Aortic and coronary artery atherosclerosis. 5. Small hiatal hernia. 6. Umbilical and inguinal fat hernias. 7. Bosniak 1 and 2 renal cysts.  Aortic Atherosclerosis (ICD10-I70.0).  Electronically Signed   By: Francis Quam M.D.   On: 11/12/2023 02:05 DG Chest Port 1 View CLINICAL DATA:  Chest pain and abdominal pain.  EXAM: PORTABLE CHEST 1 VIEW  COMPARISON:  01/12/2021  FINDINGS: Shallow inspiration. Heart size and pulmonary vascularity are normal for technique. Lungs are clear. No pleural effusion or pneumothorax. Mediastinal contours appear intact. Calcification of the aorta.  IMPRESSION: No active disease.  Electronically Signed   By: Elsie Gravely M.D.   On: 11/12/2023 01:11     Scheduled inpatient medications:   atorvastatin   40 mg Oral Daily   folic acid   1 mg Oral Daily   multivitamin with minerals  1 tablet Oral Daily   pantoprazole  (PROTONIX ) IV  40 mg Intravenous Q12H   sodium chloride  flush  3 mL Intravenous Q12H   sucralfate   1 g Oral TID WC & HS   thiamine   100 mg Oral Daily   Or   thiamine   100 mg Intravenous Daily   Continuous inpatient infusions:   sodium chloride  1,000 mL (11/13/23 1157)   PRN inpatient medications: acetaminophen , albuterol , HYDROmorphone  (DILAUDID ) injection **OR** HYDROmorphone  (DILAUDID ) injection, LORazepam  **OR** LORazepam , melatonin, ondansetron  (ZOFRAN ) IV, polyethylene glycol  Vital signs in last 24 hours: Temp:  [98.3 F (36.8 C)-99.4 F (37.4 C)] 99.4 F (37.4 C) (  07/09 1559) Pulse Rate:  [101-104] 102 (07/09 1559) Resp:  [17-19] 19 (07/09 1559) BP: (145-153)/(94-106) 145/97 (07/09 1559) SpO2:  [91 %-98 %] 93 % (07/09 1559) Last BM Date : 11/11/23  Intake/Output Summary (Last 24 hours) at 11/13/2023 1623 Last data filed at 11/13/2023 1500 Gross per 24 hour  Intake 3720.23 ml  Output --  Net 3720.23 ml     Intake/Output from previous day: 07/08 0701 - 07/09 0700 In: 1119.9 [P.O.:150; I.V.:969.9] Out: -  Intake/Output this shift: Total I/O In: 2700.3 [I.V.:2650.3; IV Piggyback:50] Out: -    Physical Exam:  General: Alert male in NAD Heart:  Regular rate and rhythm.  Pulmonary: Normal respiratory effort Abdomen: Soft, protuberant, moderate LUQ tenderness .  A few bowel sounds present . Extremities: No lower extremity edema  Neurologic: Alert and oriented Psych:  Cooperative     LOS: 1 day   Vina Dasen ,NP 11/13/2023, 4:23 PM  I have taken an interval history, thoroughly reviewed the chart and examined the patient. I agree with the Advanced Practitioner's note, impression and recommendations, and have recorded additional findings, impressions and recommendations below. I performed a substantive portion of this encounter (>50% time spent), including a complete performance of the medical decision making.  My additional thoughts are as follows:  Acute alcohol related pancreatitis without complications thus far Still having pain, wants to try clear liquid diet He does not appear to be having ongoing GI bleeding and there were no current plans for an upper endoscopy.  Continue current supportive care   Victory LITTIE Brand III Office:[phone]

## 2023-11-13 NOTE — Plan of Care (Signed)
  Problem: Education: Goal: Knowledge of General Education information will improve Description: Including pain rating scale, medication(s)/side effects and non-pharmacologic comfort measures Outcome: Progressing   Problem: Education: Goal: Knowledge of General Education information will improve Description: Including pain rating scale, medication(s)/side effects and non-pharmacologic comfort measures Outcome: Progressing   Problem: Clinical Measurements: Goal: Ability to maintain clinical measurements within normal limits will improve Outcome: Progressing Goal: Will remain free from infection Outcome: Progressing Goal: Respiratory complications will improve Outcome: Progressing   Problem: Nutrition: Goal: Adequate nutrition will be maintained Outcome: Progressing   Problem: Nutrition: Goal: Adequate nutrition will be maintained Outcome: Progressing   Problem: Coping: Goal: Level of anxiety will decrease Outcome: Progressing   Problem: Elimination: Goal: Will not experience complications related to bowel motility Outcome: Progressing

## 2023-11-14 DIAGNOSIS — K852 Alcohol induced acute pancreatitis without necrosis or infection: Secondary | ICD-10-CM | POA: Diagnosis not present

## 2023-11-14 LAB — CBC
HCT: 36 % — ABNORMAL LOW (ref 39.0–52.0)
Hemoglobin: 11.8 g/dL — ABNORMAL LOW (ref 13.0–17.0)
MCH: 31.1 pg (ref 26.0–34.0)
MCHC: 32.8 g/dL (ref 30.0–36.0)
MCV: 95 fL (ref 80.0–100.0)
Platelets: 128 K/uL — ABNORMAL LOW (ref 150–400)
RBC: 3.79 MIL/uL — ABNORMAL LOW (ref 4.22–5.81)
RDW: 13.2 % (ref 11.5–15.5)
WBC: 16.3 K/uL — ABNORMAL HIGH (ref 4.0–10.5)
nRBC: 0 % (ref 0.0–0.2)

## 2023-11-14 LAB — COMPREHENSIVE METABOLIC PANEL WITH GFR
ALT: 17 U/L (ref 0–44)
AST: 20 U/L (ref 15–41)
Albumin: 3.1 g/dL — ABNORMAL LOW (ref 3.5–5.0)
Alkaline Phosphatase: 45 U/L (ref 38–126)
Anion gap: 12 (ref 5–15)
BUN: 10 mg/dL (ref 8–23)
CO2: 20 mmol/L — ABNORMAL LOW (ref 22–32)
Calcium: 7.9 mg/dL — ABNORMAL LOW (ref 8.9–10.3)
Chloride: 110 mmol/L (ref 98–111)
Creatinine, Ser: 1.01 mg/dL (ref 0.61–1.24)
GFR, Estimated: >60 mL/min (ref >60)
Glucose, Bld: 139 mg/dL — ABNORMAL HIGH (ref 70–99)
Potassium: 3.5 mmol/L (ref 3.5–5.1)
Sodium: 142 mmol/L (ref 135–145)
Total Bilirubin: 1.4 mg/dL — ABNORMAL HIGH (ref 0.0–1.2)
Total Protein: 6.4 g/dL — ABNORMAL LOW (ref 6.5–8.1)

## 2023-11-14 LAB — MAGNESIUM: Magnesium: 1.9 mg/dL (ref 1.7–2.4)

## 2023-11-14 LAB — HEPATITIS B CORE ANTIBODY, TOTAL: HEP B CORE AB: NEGATIVE

## 2023-11-14 MED ORDER — OXYCODONE HCL 5 MG PO TABS
5.0000 mg | ORAL_TABLET | Freq: Four times a day (QID) | ORAL | Status: DC | PRN
  Administered 2023-11-14 – 2023-11-16 (×6): 5 mg via ORAL
  Filled 2023-11-14 (×6): qty 1

## 2023-11-14 NOTE — Progress Notes (Signed)
 Admitted with upper abdominal pain and acute pancreatitis. Hx of alcohol related pancreatitis, hepatic steatosis, hypertension, hyperlipidemia.   11/14/23 1238  TOC Brief Assessment  Insurance and Status Reviewed  Patient has primary care physician Yes  Home environment has been reviewed From home with mom and dad. Retiree.  Prior level of function: PTA independent with ADL's , no DME usage.  Prior/Current Home Services No current home services  Readmission risk has been reviewed No  Transition of care needs no transition of care needs at this time   Pt states want to detox and will f/u with [facility] once d/c. Pt without transportation issues or RX med concerns. TOC following and will assist with needs as they presents. Jon Hoit RN,BSN,CM

## 2023-11-14 NOTE — Plan of Care (Signed)

## 2023-11-14 NOTE — Progress Notes (Signed)
 PROGRESS NOTE [name]  FMW:[id number] DOB: August 21, 1960 DOA: 11/11/2023 PCP: [name], NP  Brief Narrative/Hospital Course:  63 y.o. male with hx of alcohol use disorder with history of recurrent alcohol related pancreatitis, hypertension, hyperlipidemia, who presents with worsening upper abdominal pain.  Reports onset of symptoms yesterday morning after of night of heavy drinking the night before.  He is drinking about a pint of liquor or sixpack of beer per day.  Yesterday morning developed sudden onset of a band of upper abdominal pain which has been persistent since onset, not relieved with Tylenol  at home.  Pain remains severe despite dilaudid  in the ED. He has had nausea and vomiting with dark, questionable coffee-ground emesis.  Has also had recent dark stools.  Reports previously going to rehab for alcohol use but relapsed, he is interested in quitting drinking again and interested in medication assisted treatment for alcohol use disorder.   Subjective: Patient seen and examined Reports pain medication are working but short lasting, Passing gas and had bowel movement Overnight afebrile BP 140s-150s, on room air  Labs reviewed stable CMP albumin  3.1 T. bili 1.4 down from 1.7 leukocytosis down to 16.3 CIWA scale running 1-5, aitvan used 7/9  Assessment and plan:  Alcoholic pancreatitis Lactic acidosis Transaminitis likely alcohol related Hepatomegaly on imaging, hepatic steatosis: Patient with significant alcohol abuse binge drinking, likely the etiology of his underlying lactic acidosis transaminitis pancreatitis and dehydration.  GI input appreciated continued aggressive IV fluid hydration, monitor electrolytes. Having bowel movement flatus pain is overall controlled on IV Dilaudid  and oral [place].  Hopefully can start diet soon await GI input.  Continue antiemetics supportive care IV fluids, ppi, carafate   as ordered. Recent Labs  Lab 11/11/23 1858 11/13/23 0533  11/14/23 0729  AST 53* 24 20  ALT 31 21 17   ALKPHOS 36* 41 45  BILITOT 1.7* 1.7* 1.4*  BILIDIR  --  0.2  --   IBILI  --  1.5*  --   PROT 7.3 6.4* 6.4*  ALBUMIN  4.3 3.3* 3.1*  INR  --  1.1  --   LIPASE 1,256*  --   --   PLT 222 151 128*      Latest Ref Rng & Units 11/13/2023    5:33 AM  Hepatitis  Hep B Surface Ag NON REACTIVE NON REACTIVE   Hep C Ab NON REACTIVE NON REACTIVE    MELD 3.0: 9 at 11/14/2023  7:29 AM MELD-Na: 9 at 11/14/2023  7:29 AM Calculated from: Serum Creatinine: 1.01 mg/dL at 2/89/7974  2:70 AM Serum Sodium: 142 mmol/L (Using max of 137 mmol/L) at 11/14/2023  7:29 AM Total Bilirubin: 1.4 mg/dL at 2/89/7974  2:70 AM Serum Albumin : 3.1 g/dL at 2/89/7974  2:70 AM INR(ratio): 1.1 at 11/13/2023  5:33 AM Age at listing (hypothetical): 79 years Sex: Male at 11/14/2023  7:29 AM   Reported black stools/coffee-ground emesis  ?Duodenitis, or gastritis, alcohol related: GI input appreciated avoid NSAID aspirin  monitor CBC regularly continue PPI Overall hemoglobin remains stable no evidence of bleeding. Recent Labs  Lab 11/11/23 1858 11/13/23 0533 11/14/23 0729  HGB 13.9 13.4 11.8*  HCT 41.0 39.9 36.0*    Severe alcohol use disorder, at risk of withdrawal: Drinking 1 pint of liquor, or sixpack per day. Previous success with rehabilitation program outpatient but relapsed. Appears motivated to quit TOC consulted continue on CIWA scale thiamine  multivitamin folate  Incidental findings: Aortic, coronary atherosclerosis Bosniak 2 renal cyst Microscopic hematuria Follow-up with PCP  Hypertension: BP  stable, continue to hold amlodipine , losartan , carvedilol  in the setting of volume depletion and lactic acidosis.  Hypokalemia Hypomagnesemia: Replaced.  Hyperlipidemia: continue home atorvastatin    Class I Obesity w/ Body mass index is 35.88 kg/m.: Will benefit with PCP follow-up, weight loss,healthy lifestyle and outpatient sleep eval if not done.  DVT  prophylaxis: SCDs Start: 11/12/23 0711: Holding off on chemical prophylaxis will start if no evidence of bleeding Code Status:   Code Status: Full Code Family Communication: plan of care discussed with patient at bedside. Patient status is: Remains hospitalized because of severity of illness Level of care: Telemetry Medical   Dispo: The patient is from:Home            Anticipated disposition: TBD Objective: Vitals last 24 hrs: Vitals:   11/13/23 1200 11/13/23 1559 11/13/23 2022 11/14/23 0517  BP: (!) 154/94 (!) 145/97 (!) 152/98 (!) 156/94  Pulse: 100 (!) 102 (!) 103 99  Resp:  19 17 15   Temp:  99.4 F (37.4 C) 99.5 F (37.5 C) 99.1 F (37.3 C)  TempSrc:  Oral Oral Oral  SpO2:  93% 91% 93%  Weight:        Physical Examination: General exam: alert awake, oriented HEENT:Oral mucosa moist, Ear/Nose WNL grossly Respiratory system: Bilaterally clear BS,no use of accessory muscle Cardiovascular system: S1 & S2 +, No JVD. Gastrointestinal system: Abdomen soft, moderately distended with generalized tenderness no guarding or rigidity,BS+ Nervous System: Alert, awake, moving all extremities,and following commands. Extremities: LE edema neg,distal peripheral pulses palpable and warm.  Skin: No rashes,no icterus. MSK: Normal muscle bulk,tone, power   Data Reviewed: I have personally reviewed following labs and imaging studies ( see epic result tab) CBC: Recent Labs  Lab 11/11/23 1858 11/13/23 0533 11/14/23 0729  WBC 16.1* 19.2* 16.3*  HGB 13.9 13.4 11.8*  HCT 41.0 39.9 36.0*  MCV 91.9 94.3 95.0  PLT 222 151 128*   CMP: Recent Labs  Lab 11/11/23 1858 11/13/23 0533 11/14/23 0729  NA 139 139 142  K 3.9 3.0* 3.5  CL 104 106 110  CO2 17* 23 20*  GLUCOSE 136* 139* 139*  BUN 27* 10 10  CREATININE 1.15 1.00 1.01  CALCIUM  9.2 7.5* 7.9*  MG  --  1.4* 1.9  PHOS  --  2.6  --    GFR: Estimated Creatinine Clearance: 102.8 mL/min (by C-G formula based on SCr of 1.01  mg/dL). Recent Labs  Lab 11/11/23 1858 11/13/23 0533 11/14/23 0729  AST 53* 24 20  ALT 31 21 17   ALKPHOS 36* 41 45  BILITOT 1.7* 1.7* 1.4*  PROT 7.3 6.4* 6.4*  ALBUMIN  4.3 3.3* 3.1*    Recent Labs  Lab 11/11/23 1858  LIPASE 1,256*   No results for input(s): AMMONIA in the last 168 hours. Coagulation Profile:  Recent Labs  Lab 11/13/23 0533  INR 1.1   Unresulted Labs (From admission, onward)     Start     Ordered   11/14/23 0500  CBC  Daily,   R      11/13/23 0850   11/14/23 0500  Comprehensive metabolic panel with GFR  Daily,   R      11/13/23 0850           Antimicrobials/Microbiology: Anti-infectives (From admission, onward)    None         Component Value Date/Time   SDES URINE, RANDOM 06/17/2020 2010   SPECREQUEST  06/17/2020 2010    NONE Performed at [facility],  [address] [place], [place] [address]    CULT >=100,000 COLONIES/mL ESCHERICHIA COLI (A) 06/17/2020 2010   REPTSTATUS 06/20/2020 FINAL 06/17/2020 2010    Procedures:  Medications reviewed: Scheduled Meds:  atorvastatin   40 mg Oral Daily   folic acid   1 mg Oral Daily   multivitamin with minerals  1 tablet Oral Daily   pantoprazole  (PROTONIX ) IV  40 mg Intravenous Q12H   sodium chloride  flush  3 mL Intravenous Q12H   sucralfate   1 g Oral TID WC & HS   thiamine   100 mg Oral Daily   Or   thiamine   100 mg Intravenous Daily   Continuous Infusions:  sodium chloride  1,000 mL (11/13/23 1157)    Mennie LAMY, MD [facility] 11/14/2023, 11:46 AM

## 2023-11-14 NOTE — TOC CAGE-AID Note (Signed)
 Transition of Care [facility]) - CAGE-AID Screening   Patient Details  Name: [name] MRN: [id number] Date of Birth: [birth year]  Transition of Care [facility]) CM/SW Contact:    Rosalva Jon Bloch, RN Phone Number: 11/14/2023, 1:03 PM   Clinical Narrative: Pt states has a drinking problem. States has been drinking for years. States drinks a pint of liquor and 6 beers a day prior to admit. States would like to participate in a detox program. States has been to a drug rehab center before. States familiar with [facility]), and once d/c will reach out to they for help.   CAGE-AID Screening:    Have You Ever Felt You Ought to Cut Down on Your Drinking or Drug Use?: Yes Have People Annoyed You By Critizing Your Drinking Or Drug Use?: Yes Have You Felt Bad Or Guilty About Your Drinking Or Drug Use?: Yes Have You Ever Had a Drink or Used Drugs First Thing In The Morning to Steady Your Nerves or to Get Rid of a Hangover?: Yes CAGE-AID Score: 4  Substance Abuse Education Offered: Yes  Substance abuse interventions: Transport planner

## 2023-11-14 NOTE — Progress Notes (Addendum)
 Daily Progress Note  DOA: 11/11/2023 Hospital Day: 4   Cc: Acute  pancreatitis Brief History:  63 y.o. year old male with a medical history including but not limited to alcohol related pancreatitis, hepatic steatosis, hypertension, hyperlipidemia   ASSESSMENT    63 year old male admitted with upper abdominal pain and acute pancreatitis. CT scan demonstrating  acute pancreatitis with patchy fluid extending into the hypogastrium, perigastrium and lesser sac, and layering over the bilateral Gerota's fascia with small amount collecting in the pelvis.  >>TODAY: Clinically improving .  Still having abdominal pain but better compared to yesterday .   He has been ambulating in the halls.  He had a bowel movement today.  He would like to try clear liquids .   Elevated LFTs ( on admission) / ETOH abuse Resolving..   >>TODAY:    T. bili 1.4 today, remainder of LFTs normal   Reported black stool and coffee-ground emesis  Resolved.     >>TODAY: Hemoglobin down to 11.8 from 13.4 but getting IVF at 150 / ml. No overt GI bleeding.    Hypokalemia  Resolved  Principal Problem:   Alcoholic pancreatitis Active Problems:   Melena   Upper abdominal pain   PLAN   --Continue empiric twice daily PPI --Continue 4 times daily Carafate  for now.  Will try and discontinue over the next day or 2 as it can contribute to constipation.    Subjective   Still having abdominal pain but better compared to yesterday .   He has been ambulating in the halls.  He had a bowel movement today.  He would like to try clear liquids   Objective   GI Studies:    Recent Labs    11/11/23 1858 11/13/23 0533 11/14/23 0729  WBC 16.1* 19.2* 16.3*  HGB 13.9 13.4 11.8*  HCT 41.0 39.9 36.0*  MCV 91.9 94.3 95.0  PLT 222 151 128*   No results for input(s): FOLATE, VITAMINB12, FERRITIN, TIBC, IRONPCTSAT in the last 72 hours. Recent Labs    11/11/23 1858 11/13/23 0533 11/14/23 0729  NA 139 139  142  K 3.9 3.0* 3.5  CL 104 106 110  CO2 17* 23 20*  GLUCOSE 136* 139* 139*  BUN 27* 10 10  CREATININE 1.15 1.00 1.01  CALCIUM  9.2 7.5* 7.9*   Recent Labs    11/11/23 1858 11/13/23 0533 11/14/23 0729  PROT 7.3 6.4* 6.4*  ALBUMIN  4.3 3.3* 3.1*  AST 53* 24 20  ALT 31 21 17   ALKPHOS 36* 41 45  BILITOT 1.7* 1.7* 1.4*  BILIDIR  --  0.2  --   IBILI  --  1.5*  --       Imaging:  CT ABDOMEN PELVIS W CONTRAST CLINICAL DATA:  Severe pancreatitis symptoms with previous history of pancreatitis.  EXAM: CT ABDOMEN AND PELVIS WITH CONTRAST  TECHNIQUE: Multidetector CT imaging of the abdomen and pelvis was performed using the standard protocol following bolus administration of intravenous contrast.  RADIATION DOSE REDUCTION: This exam was performed according to the departmental dose-optimization program which includes automated exposure control, adjustment of the mA and/or kV according to patient size and/or use of iterative reconstruction technique.  CONTRAST:  OMNIPAQUE  IOHEXOL  350 MG/ML SOLN  COMPARISON:  CT without contrast 01/12/2021, CT with contrast 05/19/2018.  FINDINGS: Lower chest: Lung bases are clear. The cardiac size is normal. Small hiatal hernia. There are 2-vessel coronary calcifications in the LAD and right coronary arteries.  Hepatobiliary: The liver is  22 cm in length with mild-to-moderate steatosis. There is no mass enhancement.  No cirrhotic surface changes to the liver contour. Gallbladder and bile ducts are unremarkable.  Pancreas: There is generalized pancreatic edema and swelling consistent with acute interstitial pancreatitis.  There is moderate peripancreatic inflammatory reaction with nonlocalized fluid extending into the hypogastrium, perigastrium and lesser sac, and surrounding the pancreas and layering over the bilateral Gerota's fascia with small amount collecting in the pelvis.  The fluid is low in density not suspicious for  hemorrhage. There is no free air or abscess.  There is no pancreatic mass enhancement or ductal dilatation.  Spleen: No abnormality.  Adrenals/Urinary Tract: There is a 2 cm Bosniak 1 cyst of the medial upper pole right kidney, Hounsfield density is 17.  There are additional scattered occasional bilateral tiny Bosniak 2 cortical cysts which are too small to characterize.  No follow-up imaging is recommended. JACR 2018 Feb; 264-273, Management of the Incidental Renal Mass on CT, [employer] 2021; 814-848, Bosniak Classification of Cystic Renal Masses, Version 2019.  There is no adrenal or renal mass enhancement. No urinary stone or obstruction is seen. There is symmetric excretion on the delayed images.  The bladder is unremarkable for the degree of distension.  Stomach/Bowel: There are thickened folds in the duodenum, probably reactive due to the pancreatitis or could be coexisting duodenitis.  Rest of the small bowel and gastric wall are unremarkable. Normal caliber appendix.  The cecum is mobile located in the anterior right mid abdomen. There is diffuse diverticulosis without evidence of acute diverticulitis.  Vascular/Lymphatic: Aortic atherosclerosis. No enlarged abdominal or pelvic lymph nodes. Multiple pelvic phleboliths.  Reproductive: Prostate is unremarkable.  Other: There are small umbilical and inguinal fat hernias. No incarcerated hernia. No free hemorrhage, free air or abscess.  Musculoskeletal: L4-5 bilateral dorsal fusion rods and pedicle screws are again noted with solid interbody fusion. Mild degenerative change of the spine is also seen.  IMPRESSION: 1. Acute interstitial pancreatitis with moderate peripancreatic inflammatory reaction and nonlocalized patchy fluid extending into the hypogastrium, perigastrium and lesser sac, and layering over the bilateral Gerota's fascia with small amount collecting in the pelvis. No free air or abscess. 2.  Thickened folds in the duodenum probably reactive due to the pancreatitis or could be coexisting duodenitis. 3. Diverticulosis without evidence of diverticulitis. 4. Aortic and coronary artery atherosclerosis. 5. Small hiatal hernia. 6. Umbilical and inguinal fat hernias. 7. Bosniak 1 and 2 renal cysts.  Aortic Atherosclerosis (ICD10-I70.0).  Electronically Signed   By: Francis Quam M.D.   On: 11/12/2023 02:05 DG Chest Port 1 View CLINICAL DATA:  Chest pain and abdominal pain.  EXAM: PORTABLE CHEST 1 VIEW  COMPARISON:  01/12/2021  FINDINGS: Shallow inspiration. Heart size and pulmonary vascularity are normal for technique. Lungs are clear. No pleural effusion or pneumothorax. Mediastinal contours appear intact. Calcification of the aorta.  IMPRESSION: No active disease.  Electronically Signed   By: Elsie Gravely M.D.   On: 11/12/2023 01:11     Scheduled inpatient medications:   atorvastatin   40 mg Oral Daily   folic acid   1 mg Oral Daily   multivitamin with minerals  1 tablet Oral Daily   pantoprazole  (PROTONIX ) IV  40 mg Intravenous Q12H   sodium chloride  flush  3 mL Intravenous Q12H   sucralfate   1 g Oral TID WC & HS   thiamine   100 mg Oral Daily   Or   thiamine   100 mg Intravenous Daily   Continuous  inpatient infusions:   sodium chloride  1,000 mL (11/14/23 1331)   PRN inpatient medications: acetaminophen , albuterol , HYDROmorphone  (DILAUDID ) injection **OR** HYDROmorphone  (DILAUDID ) injection, LORazepam  **OR** LORazepam , melatonin, ondansetron  (ZOFRAN ) IV, oxyCODONE , polyethylene glycol  Vital signs in last 24 hours: Temp:  [98.4 F (36.9 C)-99.5 F (37.5 C)] 98.4 F (36.9 C) (07/10 1416) Pulse Rate:  [91-103] 91 (07/10 1416) Resp:  [15-17] 16 (07/10 1416) BP: (152-156)/(94-98) 156/94 (07/10 1416) SpO2:  [91 %-93 %] 93 % (07/10 1416) Last BM Date : 11/14/23  Intake/Output Summary (Last 24 hours) at 11/14/2023 1631 Last data filed at 11/14/2023  1300 Gross per 24 hour  Intake 2681.04 ml  Output --  Net 2681.04 ml    Intake/Output from previous day: 07/09 0701 - 07/10 0700 In: 4490.5 [I.V.:4440.5; IV Piggyback:50] Out: -  Intake/Output this shift: Total I/O In: 890.9 [I.V.:890.9] Out: -    Physical Exam:  General: Alert male in NAD Heart:  Regular rate and rhythm.  Pulmonary: Normal respiratory effort Abdomen: Soft, protuberant , nontender.  A few bowel sounds are present  Extremities: No lower extremity edema  Neurologic: Alert and oriented Psych:  Cooperative     LOS: 2 days   Vina Dasen ,NP 11/14/2023, 4:31 PM   I have taken an interval history, thoroughly reviewed the chart and examined the patient. I agree with the Advanced Practitioner's note, impression and recommendations, and have recorded additional findings, impressions and recommendations below. I performed a substantive portion of this encounter (>50% time spent), including a complete performance of the medical decision making.  My additional thoughts are as follows:  He looks and feels better today with less pain and nausea.  Wants to slowly advance diet No melena or coffee-ground's emesis since admission, and at this point I do not think you will need an EGD.  Continue supportive care for pancreatitis and we will follow.   Victory LITTIE Brand III Office:[phone]

## 2023-11-15 ENCOUNTER — Inpatient Hospital Stay (HOSPITAL_COMMUNITY): Payer: MEDICAID

## 2023-11-15 DIAGNOSIS — K852 Alcohol induced acute pancreatitis without necrosis or infection: Secondary | ICD-10-CM | POA: Diagnosis not present

## 2023-11-15 DIAGNOSIS — K567 Ileus, unspecified: Secondary | ICD-10-CM

## 2023-11-15 DIAGNOSIS — R1084 Generalized abdominal pain: Secondary | ICD-10-CM

## 2023-11-15 LAB — COMPREHENSIVE METABOLIC PANEL WITH GFR
ALT: 14 U/L (ref 0–44)
AST: 22 U/L (ref 15–41)
Albumin: 2.8 g/dL — ABNORMAL LOW (ref 3.5–5.0)
Alkaline Phosphatase: 45 U/L (ref 38–126)
Anion gap: 9 (ref 5–15)
BUN: 7 mg/dL — ABNORMAL LOW (ref 8–23)
CO2: 24 mmol/L (ref 22–32)
Calcium: 8.1 mg/dL — ABNORMAL LOW (ref 8.9–10.3)
Chloride: 105 mmol/L (ref 98–111)
Creatinine, Ser: 0.88 mg/dL (ref 0.61–1.24)
GFR, Estimated: >60 mL/min (ref >60)
Glucose, Bld: 143 mg/dL — ABNORMAL HIGH (ref 70–99)
Potassium: 3.7 mmol/L (ref 3.5–5.1)
Sodium: 138 mmol/L (ref 135–145)
Total Bilirubin: 1.2 mg/dL (ref 0.0–1.2)
Total Protein: 6.1 g/dL — ABNORMAL LOW (ref 6.5–8.1)

## 2023-11-15 LAB — CBC
HCT: 35.5 % — ABNORMAL LOW (ref 39.0–52.0)
Hemoglobin: 11.7 g/dL — ABNORMAL LOW (ref 13.0–17.0)
MCH: 31.5 pg (ref 26.0–34.0)
MCHC: 33 g/dL (ref 30.0–36.0)
MCV: 95.7 fL (ref 80.0–100.0)
Platelets: 159 K/uL (ref 150–400)
RBC: 3.71 MIL/uL — ABNORMAL LOW (ref 4.22–5.81)
RDW: 13.1 % (ref 11.5–15.5)
WBC: 14.6 K/uL — ABNORMAL HIGH (ref 4.0–10.5)
nRBC: 0 % (ref 0.0–0.2)

## 2023-11-15 MED ORDER — ENOXAPARIN SODIUM 40 MG/0.4ML IJ SOSY
40.0000 mg | PREFILLED_SYRINGE | INTRAMUSCULAR | Status: DC
  Administered 2023-11-15 – 2023-11-20 (×6): 40 mg via SUBCUTANEOUS
  Filled 2023-11-15 (×6): qty 0.4

## 2023-11-15 MED ORDER — BISACODYL 10 MG RE SUPP: 10.0000 mg | Freq: Every day | RECTAL | Status: DC | PRN

## 2023-11-15 NOTE — Progress Notes (Signed)
 [name]  Chief Complaint: Acute alcohol-related pancreatitis and abdominal distention  History:  His abdomen feels more uncomfortable today and bloated with distention Passed a couple small semiformed BMs earlier today but does not feel well evacuated.  No vomiting, still on clear liquids  ROS: Cardiovascular: No chest pain Respiratory: No dyspnea Urinary: No dysuria  Objective:   Current Facility-Administered Medications:    [COMPLETED] sodium chloride  0.9 % bolus 1,000 mL, 1,000 mL, Intravenous, Once, Stopped at 11/12/23 0200 **FOLLOWED BY** [COMPLETED] sodium chloride  0.9 % bolus 1,000 mL, 1,000 mL, Intravenous, Once, Stopped at 11/12/23 0200 **FOLLOWED BY** 0.9 %  sodium chloride  infusion, 1,000 mL, Intravenous, Continuous, Segars, Dorn, MD, Stopped at 11/14/23 1932   acetaminophen  (TYLENOL ) tablet 1,000 mg, 1,000 mg, Oral, Q6H PRN, Segars, Jonathan, MD, 1,000 mg at 11/15/23 1411   albuterol  (PROVENTIL ) (2.5 MG/3ML) 0.083% nebulizer solution 2.5 mg, 2.5 mg, Nebulization, Q4H PRN, Segars, Jonathan, MD   atorvastatin  (LIPITOR) tablet 40 mg, 40 mg, Oral, Daily, Segars, Dorn, MD, 40 mg at 11/15/23 1013   bisacodyl  (DULCOLAX) suppository 10 mg, 10 mg, Rectal, Daily PRN, Kc, Mennie, MD   enoxaparin  (LOVENOX ) injection 40 mg, 40 mg, Subcutaneous, Q24H, Kc, Ramesh, MD, 40 mg at 11/15/23 1429   folic acid  (FOLVITE ) tablet 1 mg, 1 mg, Oral, Daily, Segars, Dorn, MD, 1 mg at 11/15/23 1013   HYDROmorphone  (DILAUDID ) injection 0.5 mg, 0.5 mg, Intravenous, Q4H PRN **OR** HYDROmorphone  (DILAUDID ) injection 1 mg, 1 mg, Intravenous, Q4H PRN, Segars, Jonathan, MD, 1 mg at 11/15/23 1412   melatonin tablet 6 mg, 6 mg, Oral, QHS PRN, Segars, Jonathan, MD   multivitamin with minerals tablet 1 tablet, 1 tablet, Oral, Daily, Segars, Dorn, MD, 1 tablet at 11/15/23 1013   ondansetron  (ZOFRAN ) injection 4 mg, 4 mg, Intravenous, Q6H PRN, Segars, Jonathan, MD, 4 mg at 11/15/23  0805   oxyCODONE  (Oxy IR/ROXICODONE ) immediate release tablet 5 mg, 5 mg, Oral, Q6H PRN, Kc, Ramesh, MD, 5 mg at 11/15/23 1411   pantoprazole  (PROTONIX ) injection 40 mg, 40 mg, Intravenous, Q12H, Segars, Dorn, MD, 40 mg at 11/15/23 1013   polyethylene glycol (MIRALAX  / GLYCOLAX ) packet 17 g, 17 g, Oral, Daily PRN, Segars, Jonathan, MD   sodium chloride  flush (NS) 0.9 % injection 3 mL, 3 mL, Intravenous, Q12H, Segars, Dorn, MD, 3 mL at 11/15/23 1020   sucralfate  (CARAFATE ) 1 GM/10ML suspension 1 g, 1 g, Oral, TID WC & HS, Segars, Dorn, MD, 1 g at 11/15/23 1429   thiamine  (VITAMIN B1) tablet 100 mg, 100 mg, Oral, Daily, 100 mg at 11/15/23 1013 **OR** thiamine  (VITAMIN B1) injection 100 mg, 100 mg, Intravenous, Daily, Segars, Dorn, MD, 100 mg at 11/12/23 1013   sodium chloride  Stopped (11/14/23 1932)     Vital signs in last 24 hrs: Vitals:   11/15/23 0752 11/15/23 1448  BP: (!) 158/97 (!) 156/99  Pulse: 87 87  Resp: 18 18  Temp: 98.6 F (37 C) 98.7 F (37.1 C)  SpO2: 93% 91%    Intake/Output Summary (Last 24 hours) at 11/15/2023 1559 Last data filed at 11/15/2023 1020 Gross per 24 hour  Intake 652.39 ml  Output --  Net 652.39 ml     Physical Exam  HEENT: sclera anicteric, oral mucosa without lesions Neck: supple, no thyromegaly, JVD or lymphadenopathy Cardiac: RRR without murmurs, S1S2 heard, no peripheral edema Pulm: clear to auscultation bilaterally, normal RR and effort noted Abdomen: soft, distended and tympanitic, no focal tenderness Skin; warm and dry, no  jaundice  Recent Labs:     Latest Ref Rng & Units 11/15/2023    6:45 AM 11/14/2023    7:29 AM 11/13/2023    5:33 AM  CBC  WBC 4.0 - 10.5 K/uL 14.6  16.3  19.2   Hemoglobin 13.0 - 17.0 g/dL 88.2  88.1  86.5   Hematocrit 39.0 - 52.0 % 35.5  36.0  39.9   Platelets 150 - 400 K/uL 159  128  151     Recent Labs  Lab 11/13/23 0533  INR 1.1      Latest Ref Rng & Units 11/15/2023    6:45 AM 11/14/2023     7:29 AM 11/13/2023    5:33 AM  CMP  Glucose 70 - 99 mg/dL 856  860  860   BUN 8 - 23 mg/dL 7  10  10    Creatinine 0.61 - 1.24 mg/dL 9.11  8.98  8.99   Sodium 135 - 145 mmol/L 138  142  139   Potassium 3.5 - 5.1 mmol/L 3.7  3.5  3.0   Chloride 98 - 111 mmol/L 105  110  106   CO2 22 - 32 mmol/L 24  20  23    Calcium  8.9 - 10.3 mg/dL 8.1  7.9  7.5   Total Protein 6.5 - 8.1 g/dL 6.1  6.4  6.4   Total Bilirubin 0.0 - 1.2 mg/dL 1.2  1.4  1.7   Alkaline Phos 38 - 126 U/L 45  45  41   AST 15 - 41 U/L 22  20  24    ALT 0 - 44 U/L 14  17  21       Radiologic studies: Narrative & Impression  CLINICAL DATA:  Bloating and abdominal pain   EXAM: PORTABLE ABDOMEN - 1 VIEW   COMPARISON:  CT abdomen and pelvis dated 11/12/2023, abdominal radiograph dated 10/10/2016   FINDINGS: Increased diffuse gas-filled dilation of loops of bowel throughout the abdomen. Lucency along the right hepatic margin of the liver, likely interface with peritoneal fat. No definite free air or pneumatosis. No abnormal radio-opaque calculi or mass effect. No acute or substantial osseous abnormality. The sacrum and coccyx are partially obscured by overlying bowel contents. Partially imaged lumbar spinal fixation hardware appears intact.   IMPRESSION: Diffuse gas-filled dilated loops of bowel throughout the abdomen, which may represent ileus in the setting of acute pancreatitis.     Electronically Signed   By: Limin  Xu M.D.   On: 11/15/2023 10:24    Assessment & Plan  Assessment: Acute alcohol related pancreatitis Generalized abdominal pain Ileus related to pancreatitis and possibly opioid analgesics  Plan: Do not advance diet clear liquids - -will likely need IV fluids resumed Work to mobilize patient as much as possible Discontinue opioid analgesics Very little stool seen on KUB (images personally reviewed), so likely little benefit from enema or suppository  [name] Office:  [phone]

## 2023-11-15 NOTE — Progress Notes (Signed)
 PROGRESS NOTE [name]  FMW:[id number] DOB: 12/25/1960 DOA: 11/11/2023 PCP: [name], NP  Brief Narrative/Hospital Course:  63 y.o. male with hx of alcohol use disorder with history of recurrent alcohol related pancreatitis, hypertension, hyperlipidemia, who presents with worsening upper abdominal pain.  Reports onset of symptoms yesterday morning after of night of heavy drinking the night before.  He is drinking about a pint of liquor or sixpack of beer per day.  Yesterday morning developed sudden onset of a band of upper abdominal pain which has been persistent since onset, not relieved with Tylenol  at home.  Pain remains severe despite dilaudid  in the ED. He has had nausea and vomiting with dark, questionable coffee-ground emesis.  Has also had recent dark stools.  Reports previously going to rehab for alcohol use but relapsed, he is interested in quitting drinking again and interested in medication assisted treatment for alcohol use disorder.   Subjective: Seen this am C/o abdomen bloating,  I want to throw up to relieve pain is controlled on meds Had BM thsi am and yesterday Overnight patient has been afebrile BP stable  WBC continues to improve, BMP stable CIWA scale last was 1 on 7/10 Clear liquid diet started  7/10  Assessment and plan:  Alcoholic pancreatitis Lactic acidosis Transaminitis likely alcohol related Hepatomegaly on imaging, hepatic steatosis: Patient with significant alcohol abuse binge drinking, likely the etiology of his underlying lactic acidosis transaminitis pancreatitis and dehydration.  Managing conservatively with aggressive IV fluid hydration, antiemetics, PPI, IV Dilaudid /Oxy Clear liquid diet started 7/10, advance as tolerated, GI input again appreciated Having bloating although having Bms, will xray abd  Reported black stools/coffee-ground emesis  ?Duodenitis, or gastritis, alcohol related: GI input appreciated avoid NSAID aspirin  monitor CBC  regularly continue PPI. Overall hemoglobin remains stable no evidence of bleeding. Recent Labs  Lab 11/11/23 1858 11/13/23 0533 11/14/23 0729 11/15/23 0645  HGB 13.9 13.4 11.8* 11.7*  HCT 41.0 39.9 36.0* 35.5*    Severe alcohol use disorder, at risk of withdrawal: Drinking 1 pint of liquor, or sixpack per day. Previous success with rehabilitation program outpatient but relapsed. Appears motivated to quit TOC consulted continue on CIWA scale thiamine  multivitamin folate.  Incidental findings: Aortic, coronary atherosclerosis Bosniak 2 renal cyst Microscopic hematuria Follow-up with PCP  Hypertension: BP stable, continue to hold amlodipine , losartan , carvedilol  in the setting of volume depletion and lactic acidosis.  Hypokalemia Hypomagnesemia: Replaced.  Hyperlipidemia: continue home atorvastatin    Class I Obesity w/ Body mass index is 35.88 kg/m.: Will benefit with PCP follow-up, weight loss,healthy lifestyle and outpatient sleep eval if not done.  DVT prophylaxis: SCDs Start: 11/12/23 0711: start lovenox  Code Status:   Code Status: Full Code Family Communication: plan of care discussed with patient at bedside. Patient status is: Remains hospitalized because of severity of illness Level of care: Telemetry Medical   Dispo: The patient is from:Home            Anticipated disposition: TBD Objective: Vitals last 24 hrs: Vitals:   11/14/23 0517 11/14/23 1416 11/14/23 1952 11/15/23 0752  BP: (!) 156/94 (!) 156/94 (!) 156/94 (!) 158/97  Pulse: 99 91 96 87  Resp: 15 16 19 18   Temp: 99.1 F (37.3 C) 98.4 F (36.9 C) 98.4 F (36.9 C) 98.6 F (37 C)  TempSrc: Oral Oral Rectal   SpO2: 93% 93% 93% 93%  Weight:        Physical Examination: General exam: alert awake, oriented at baseline, older than stated age HEENT:Oral  mucosa moist, Ear/Nose WNL grossly Respiratory system: Bilaterally clear BS,no use of accessory muscle Cardiovascular system: S1 & S2 +, No  JVD. Gastrointestinal system: Abdomen soft, distended, obese, mildly tender BS+ Nervous System: Alert, awake, moving all extremities,and following commands. Extremities: LE edema neg,distal peripheral pulses palpable and warm.  Skin: No rashes,no icterus. MSK: Normal muscle bulk,tone, power   Data Reviewed: I have personally reviewed following labs and imaging studies ( see epic result tab) CBC: Recent Labs  Lab 11/11/23 1858 11/13/23 0533 11/14/23 0729 11/15/23 0645  WBC 16.1* 19.2* 16.3* 14.6*  HGB 13.9 13.4 11.8* 11.7*  HCT 41.0 39.9 36.0* 35.5*  MCV 91.9 94.3 95.0 95.7  PLT 222 151 128* 159   CMP: Recent Labs  Lab 11/11/23 1858 11/13/23 0533 11/14/23 0729 11/15/23 0645  NA 139 139 142 138  K 3.9 3.0* 3.5 3.7  CL 104 106 110 105  CO2 17* 23 20* 24  GLUCOSE 136* 139* 139* 143*  BUN 27* 10 10 7*  CREATININE 1.15 1.00 1.01 0.88  CALCIUM  9.2 7.5* 7.9* 8.1*  MG  --  1.4* 1.9  --   PHOS  --  2.6  --   --    GFR: Estimated Creatinine Clearance: 118 mL/min (by C-G formula based on SCr of 0.88 mg/dL). Recent Labs  Lab 11/11/23 1858 11/13/23 0533 11/14/23 0729 11/15/23 0645  AST 53* 24 20 22   ALT 31 21 17 14   ALKPHOS 36* 41 45 45  BILITOT 1.7* 1.7* 1.4* 1.2  PROT 7.3 6.4* 6.4* 6.1*  ALBUMIN  4.3 3.3* 3.1* 2.8*    Recent Labs  Lab 11/11/23 1858  LIPASE 1,256*   No results for input(s): AMMONIA in the last 168 hours. Coagulation Profile:  Recent Labs  Lab 11/13/23 0533  INR 1.1   Unresulted Labs (From admission, onward)     Start     Ordered   11/14/23 0500  CBC  Daily,   R      11/13/23 0850   11/14/23 0500  Comprehensive metabolic panel with GFR  Daily,   R      11/13/23 0850           Antimicrobials/Microbiology: Anti-infectives (From admission, onward)    None         Component Value Date/Time   SDES URINE, RANDOM 06/17/2020 2010   SPECREQUEST  06/17/2020 2010    NONE Performed at [facility], [address] [place],  [place] [address]    CULT >=100,000 COLONIES/mL ESCHERICHIA COLI (A) 06/17/2020 2010   REPTSTATUS 06/20/2020 FINAL 06/17/2020 2010    Procedures:  Medications reviewed: Scheduled Meds:  atorvastatin   40 mg Oral Daily   folic acid   1 mg Oral Daily   multivitamin with minerals  1 tablet Oral Daily   pantoprazole  (PROTONIX ) IV  40 mg Intravenous Q12H   sodium chloride  flush  3 mL Intravenous Q12H   sucralfate   1 g Oral TID WC & HS   thiamine   100 mg Oral Daily   Or   thiamine   100 mg Intravenous Daily   Continuous Infusions:  sodium chloride  Stopped (11/14/23 1932)    Mennie LAMY, MD [facility] 11/15/2023, 9:51 AM

## 2023-11-16 ENCOUNTER — Inpatient Hospital Stay (HOSPITAL_COMMUNITY): Payer: MEDICAID

## 2023-11-16 DIAGNOSIS — K852 Alcohol induced acute pancreatitis without necrosis or infection: Secondary | ICD-10-CM | POA: Diagnosis not present

## 2023-11-16 LAB — COMPREHENSIVE METABOLIC PANEL WITH GFR
ALT: 15 U/L (ref 0–44)
AST: 19 U/L (ref 15–41)
Albumin: 2.9 g/dL — ABNORMAL LOW (ref 3.5–5.0)
Alkaline Phosphatase: 46 U/L (ref 38–126)
Anion gap: 11 (ref 5–15)
BUN: 5 mg/dL — ABNORMAL LOW (ref 8–23)
CO2: 24 mmol/L (ref 22–32)
Calcium: 8.4 mg/dL — ABNORMAL LOW (ref 8.9–10.3)
Chloride: 104 mmol/L (ref 98–111)
Creatinine, Ser: 0.95 mg/dL (ref 0.61–1.24)
GFR, Estimated: >60 mL/min (ref >60)
Glucose, Bld: 113 mg/dL — ABNORMAL HIGH (ref 70–99)
Potassium: 3.2 mmol/L — ABNORMAL LOW (ref 3.5–5.1)
Sodium: 139 mmol/L (ref 135–145)
Total Bilirubin: 1.3 mg/dL — ABNORMAL HIGH (ref 0.0–1.2)
Total Protein: 6.2 g/dL — ABNORMAL LOW (ref 6.5–8.1)

## 2023-11-16 LAB — CBC
HCT: 33.2 % — ABNORMAL LOW (ref 39.0–52.0)
Hemoglobin: 11.2 g/dL — ABNORMAL LOW (ref 13.0–17.0)
MCH: 32.2 pg (ref 26.0–34.0)
MCHC: 33.7 g/dL (ref 30.0–36.0)
MCV: 95.4 fL (ref 80.0–100.0)
Platelets: 184 K/uL (ref 150–400)
RBC: 3.48 MIL/uL — ABNORMAL LOW (ref 4.22–5.81)
RDW: 13 % (ref 11.5–15.5)
WBC: 11.1 K/uL — ABNORMAL HIGH (ref 4.0–10.5)
nRBC: 0 % (ref 0.0–0.2)

## 2023-11-16 MED ORDER — POLYETHYLENE GLYCOL 3350 17 G PO PACK
17.0000 g | PACK | Freq: Every day | ORAL | Status: DC
  Administered 2023-11-17 – 2023-11-20 (×3): 17 g via ORAL
  Filled 2023-11-16 (×4): qty 1

## 2023-11-16 MED ORDER — POTASSIUM CHLORIDE 10 MEQ/100ML IV SOLN
10.0000 meq | INTRAVENOUS | Status: AC
  Administered 2023-11-16 (×4): 10 meq via INTRAVENOUS
  Filled 2023-11-16 (×4): qty 100

## 2023-11-16 MED ORDER — POTASSIUM CHLORIDE CRYS ER 20 MEQ PO TBCR
20.0000 meq | EXTENDED_RELEASE_TABLET | Freq: Once | ORAL | Status: AC
  Administered 2023-11-16: 20 meq via ORAL
  Filled 2023-11-16: qty 1

## 2023-11-16 NOTE — Progress Notes (Signed)
 PROGRESS NOTE [facility] [place]  FMW:[id number] DOB: Jan 01, 1961 DOA: 11/11/2023 PCP: [name], NP  Brief Narrative/Hospital Course:  63 y.o. male with hx of alcohol use disorder with history of recurrent alcohol related pancreatitis, hypertension, hyperlipidemia, who presents with worsening upper abdominal pain.  Reports onset of symptoms yesterday morning after of night of heavy drinking the night before.  He is drinking about a pint of liquor or sixpack of beer per day.  Yesterday morning developed sudden onset of a band of upper abdominal pain which has been persistent since onset, not relieved with Tylenol  at home.  Pain remains severe despite dilaudid  in the ED. He has had nausea and vomiting with dark, questionable coffee-ground emesis.  Has also had recent dark stools.  Reports previously going to rehab for alcohol use but relapsed, he is interested in quitting drinking again and interested in medication assisted treatment for alcohol use disorder.  GI is following and is being managed conservatively with acute pancreatitis, complicated by ileus  Subjective: Patient seen and examined Took miralax  this am and had BM and abdomen feels less distended Overnight afebrile BP trending up 150s-180s Labs shows leukocytosis almost resolved, potassium at 3.2 BUN 5 LFTs stable last BM 7/10 with abdominal distention x-ray 7/11 showed ileus  Assessment and plan:  Alcoholic pancreatitis complicated by ileus Lactic acidosis Transaminitis likely alcohol related Hepatomegaly on imaging, hepatic steatosis: Patient with significant alcohol abuse binge drinking, likely the etiology of his underlying lactic acidosis transaminitis pancreatitis and dehydration.  Managing conservatively with aggressive IV fluid hydration, antiemetics, PPI, IV Dilaudid  prn On clear liquid diet from 7/10, having more abdominal distention 7/1 x-ray revealed ileus Oxycodone  discontinued, continue current conservative management  supportive care.  Not much stool and x-ray to try enema or laxatives Ambulate, minimize opiates, monitor electrolytes, cont bowel regimen.  Reported black stools/coffee-ground emesis  ?Duodenitis, or gastritis, alcohol related: GI input appreciated avoid NSAID aspirin  monitor CBC regularly continue PPI. Overall hemoglobin remains stable no evidence of bleeding. Recent Labs  Lab 11/11/23 1858 11/13/23 0533 11/14/23 0729 11/15/23 0645 11/16/23 0703  HGB 13.9 13.4 11.8* 11.7* 11.2*  HCT 41.0 39.9 36.0* 35.5* 33.2*    Severe alcohol use disorder, at risk of withdrawal: Drinking 1 pint of liquor, or sixpack per day. Previous success with rehabilitation program outpatient but relapsed. Appears motivated to quit TOC consulted continue on CIWA scale thiamine  multivitamin folate.  Hypokalemia: Will replace with IV and po potassium Recent Labs  Lab 11/11/23 1858 11/13/23 0533 11/14/23 0729 11/15/23 0645 11/16/23 0703  K 3.9 3.0* 3.5 3.7 3.2*    Incidental findings: Aortic, coronary atherosclerosis Bosniak 2 renal cyst Microscopic hematuria Follow-up with PCP  Hypertension: BP acceptable.  Continue to hold amlodipine , losartan , carvedilol  in the setting of volume depletion and lactic acidosis.  Hypokalemia Hypomagnesemia: Replaced.  Continue to monitor  Hyperlipidemia: continue home atorvastatin    Class I Obesity w/ Body mass index is 38.3 kg/m.: Will benefit with PCP follow-up, weight loss,healthy lifestyle and outpatient sleep eval if not done.  DVT prophylaxis: enoxaparin  (LOVENOX ) injection 40 mg Start: 11/15/23 1200 SCDs Start: 11/12/23 0711: start lovenox  Code Status:   Code Status: Full Code Family Communication: plan of care discussed with patient at bedside. Patient status is: Remains hospitalized because of severity of illness Level of care: Telemetry Medical   Dispo: The patient is from:Home            Anticipated disposition: TBD Objective: Vitals last 24  hrs: Vitals:   11/15/23 1448  11/15/23 2003 11/16/23 0452 11/16/23 0827  BP: (!) 156/99 (!) 183/101 (!) 177/95 (!) 160/90  Pulse: 87 85 85 97  Resp: 18 15  16   Temp: 98.7 F (37.1 C) 97.8 F (36.6 C) 98.6 F (37 C) 98.4 F (36.9 C)  TempSrc: Rectal Rectal Oral   SpO2: 91% 92% 95% 95%  Weight:   128.1 kg     Physical Examination: General exam: alert awake, oriented at baseline, older than stated age HEENT:Oral mucosa moist, Ear/Nose WNL grossly Respiratory system: Bilaterally clear BS,no use of accessory muscle Cardiovascular system: S1 & S2 +, No JVD. Gastrointestinal system: Abdomen distended mildly tender BS + Nervous System: Alert, awake, moving all extremities,and following commands. Extremities: LE edema neg,distal peripheral pulses palpable and warm.  Skin: No rashes,no icterus. MSK: Normal muscle bulk,tone, power   Data Reviewed: I have personally reviewed following labs and imaging studies ( see epic result tab) CBC: Recent Labs  Lab 11/11/23 1858 11/13/23 0533 11/14/23 0729 11/15/23 0645 11/16/23 0703  WBC 16.1* 19.2* 16.3* 14.6* 11.1*  HGB 13.9 13.4 11.8* 11.7* 11.2*  HCT 41.0 39.9 36.0* 35.5* 33.2*  MCV 91.9 94.3 95.0 95.7 95.4  PLT 222 151 128* 159 184   CMP: Recent Labs  Lab 11/11/23 1858 11/13/23 0533 11/14/23 0729 11/15/23 0645 11/16/23 0703  NA 139 139 142 138 139  K 3.9 3.0* 3.5 3.7 3.2*  CL 104 106 110 105 104  CO2 17* 23 20* 24 24  GLUCOSE 136* 139* 139* 143* 113*  BUN 27* 10 10 7* 5*  CREATININE 1.15 1.00 1.01 0.88 0.95  CALCIUM  9.2 7.5* 7.9* 8.1* 8.4*  MG  --  1.4* 1.9  --   --   PHOS  --  2.6  --   --   --    GFR: Estimated Creatinine Clearance: 113 mL/min (by C-G formula based on SCr of 0.95 mg/dL). Recent Labs  Lab 11/11/23 1858 11/13/23 0533 11/14/23 0729 11/15/23 0645 11/16/23 0703  AST 53* 24 20 22 19   ALT 31 21 17 14 15   ALKPHOS 36* 41 45 45 46  BILITOT 1.7* 1.7* 1.4* 1.2 1.3*  PROT 7.3 6.4* 6.4* 6.1* 6.2*  ALBUMIN   4.3 3.3* 3.1* 2.8* 2.9*    Recent Labs  Lab 11/11/23 1858  LIPASE 1,256*   No results for input(s): AMMONIA in the last 168 hours. Coagulation Profile:  Recent Labs  Lab 11/13/23 0533  INR 1.1   Unresulted Labs (From admission, onward)     Start     Ordered   11/22/23 0500  Creatinine, serum  (enoxaparin  (LOVENOX )    CrCl >/= 30 ml/min)  Weekly,   R     Comments: while on enoxaparin  therapy    11/15/23 1051   11/14/23 0500  CBC  Daily,   R      11/13/23 0850   11/14/23 0500  Comprehensive metabolic panel with GFR  Daily,   R      11/13/23 0850           Antimicrobials/Microbiology: Anti-infectives (From admission, onward)    None         Component Value Date/Time   SDES URINE, RANDOM 06/17/2020 2010   SPECREQUEST  06/17/2020 2010    NONE Performed at [facility], [address], [place], [place] [address]    CULT >=100,000 COLONIES/mL ESCHERICHIA COLI (A) 06/17/2020 2010   REPTSTATUS 06/20/2020 FINAL 06/17/2020 2010    Procedures:  Medications reviewed: Scheduled Meds:  atorvastatin   40 mg Oral Daily   enoxaparin  (LOVENOX ) injection  40 mg Subcutaneous Q24H   folic acid   1 mg Oral Daily   multivitamin with minerals  1 tablet Oral Daily   pantoprazole  (PROTONIX ) IV  40 mg Intravenous Q12H   potassium chloride   20 mEq Oral Once   sodium chloride  flush  3 mL Intravenous Q12H   sucralfate   1 g Oral TID WC & HS   thiamine   100 mg Oral Daily   Or   thiamine   100 mg Intravenous Daily   Continuous Infusions:  sodium chloride  1,000 mL (11/16/23 0755)   potassium chloride       Mennie LAMY, MD [facility] 11/16/2023, 10:17 AM

## 2023-11-16 NOTE — Progress Notes (Addendum)
 Daily Progress Note  DOA: 11/11/2023 Hospital Day: 6   Cc:  Acute Etoh pancreatitis and ileus   ASSESSMENT    63 year old male admitted with upper abdominal pain and acute pancreatitis. CT scan demonstrating  acute pancreatitis with patchy fluid extending into the hypogastrium, perigastrium and lesser sac, and layering over the bilateral Gerota's fascia with small amount collecting in the pelvis.  >>TODAY: WBC continues to trend down and has nearly normalized. Currently NPO except for water due to development of ileus  Ileus  In setting of acute pancreatitis, decreased mobility and narcotics.  Oxycodone  has been discontinued, currently getting only Dilaudid  as needed  >> TODAY: Clinically tolerating the ileus. Has some nausea but had intermittently throughout this admission. No vomiting. Abdominal pain not any worse ( in fact seems a little better today). Passing flatus. Last BM was two days ago.   Elevated LFTs ( on admission) / ETOH abuse Resolving..   >>TODAY:   T. bili 1.3 today, remainder of LFTs normal   Hypokalemia  K+ 3.2.  Repletion in progress    Principal Problem:   Alcoholic pancreatitis Active Problems:   Melena   Upper abdominal pain   Ileus (HCC)   PLAN   --Water only for now.  --Bowel regimen. Yesterday's KUB didn't show large stool burden but he is at risk. Last BM 2 days ago. Changing miralax  from PRN to once daily  --Stopping Carafate   ( can worsen constipation).    --Please try to keep K+ above 4.  --Continue to minimize narcotics as much as possible.  --Continue maintenance IVF since he is basically NPO right now --Follow up KUB tomorrow   Subjective   Has some nausea ( not new) but no vomiting. Abdominal pain a little better, especially as he passes more flatus.  No vomiting.  Last BM 2 days ago. Basically NPO except for water.   Objective   GI Studies:    Recent Labs    11/14/23 0729 11/15/23 0645 11/16/23 0703  WBC 16.3*  14.6* 11.1*  HGB 11.8* 11.7* 11.2*  HCT 36.0* 35.5* 33.2*  MCV 95.0 95.7 95.4  PLT 128* 159 184   No results for input(s): FOLATE, VITAMINB12, FERRITIN, TIBC, IRONPCTSAT in the last 72 hours. Recent Labs    11/14/23 0729 11/15/23 0645 11/16/23 0703  NA 142 138 139  K 3.5 3.7 3.2*  CL 110 105 104  CO2 20* 24 24  GLUCOSE 139* 143* 113*  BUN 10 7* 5*  CREATININE 1.01 0.88 0.95  CALCIUM  7.9* 8.1* 8.4*   Recent Labs    11/14/23 0729 11/15/23 0645 11/16/23 0703  PROT 6.4* 6.1* 6.2*  ALBUMIN  3.1* 2.8* 2.9*  AST 20 22 19   ALT 17 14 15   ALKPHOS 45 45 46  BILITOT 1.4* 1.2 1.3*      Imaging:  DG Abd Portable 1V CLINICAL DATA:  Bloating and abdominal pain  EXAM: PORTABLE ABDOMEN - 1 VIEW  COMPARISON:  CT abdomen and pelvis dated 11/12/2023, abdominal radiograph dated 10/10/2016  FINDINGS: Increased diffuse gas-filled dilation of loops of bowel throughout the abdomen. Lucency along the right hepatic margin of the liver, likely interface with peritoneal fat. No definite free air or pneumatosis. No abnormal radio-opaque calculi or mass effect. No acute or substantial osseous abnormality. The sacrum and coccyx are partially obscured by overlying bowel contents. Partially imaged lumbar spinal fixation hardware appears intact.  IMPRESSION: Diffuse gas-filled dilated loops of bowel throughout the abdomen, which may represent ileus  in the setting of acute pancreatitis.  Electronically Signed   By: Limin  Xu M.D.   On: 11/15/2023 10:24     Scheduled inpatient medications:   atorvastatin   40 mg Oral Daily   enoxaparin  (LOVENOX ) injection  40 mg Subcutaneous Q24H   folic acid   1 mg Oral Daily   multivitamin with minerals  1 tablet Oral Daily   pantoprazole  (PROTONIX ) IV  40 mg Intravenous Q12H   sodium chloride  flush  3 mL Intravenous Q12H   sucralfate   1 g Oral TID WC & HS   thiamine   100 mg Oral Daily   Or   thiamine   100 mg Intravenous Daily    Continuous inpatient infusions:   sodium chloride  1,000 mL (11/16/23 0755)   potassium chloride  10 mEq (11/16/23 1211)   PRN inpatient medications: acetaminophen , albuterol , bisacodyl , HYDROmorphone  (DILAUDID ) injection **OR** HYDROmorphone  (DILAUDID ) injection, melatonin, ondansetron  (ZOFRAN ) IV, polyethylene glycol  Vital signs in last 24 hours: Temp:  [97.8 F (36.6 C)-98.7 F (37.1 C)] 98.4 F (36.9 C) (07/12 0827) Pulse Rate:  [85-97] 97 (07/12 0827) Resp:  [15-18] 16 (07/12 0827) BP: (156-183)/(90-101) 160/90 (07/12 0827) SpO2:  [91 %-95 %] 95 % (07/12 0827) Weight:  [128.1 kg] 128.1 kg (07/12 0452) Last BM Date : 11/14/23  Intake/Output Summary (Last 24 hours) at 11/16/2023 1224 Last data filed at 11/15/2023 2150 Gross per 24 hour  Intake 243 ml  Output --  Net 243 ml    Intake/Output from previous day: 07/11 0701 - 07/12 0700 In: 246 [P.O.:240; I.V.:6] Out: -  Intake/Output this shift: No intake/output data recorded.   Physical Exam:  General: Alert male in NAD Heart:  Regular rate and rhythm.  Pulmonary: Normal respiratory effort Abdomen: Soft, largely distended, nontender. Hypoactive bowel sounds. Extremities: No lower extremity edema  Neurologic: Alert and oriented Psych: Pleasant.      LOS: 4 days   Vina Dasen ,NP 11/16/2023, 12:24 PM  I have taken an interval history, thoroughly reviewed the chart and examined the patient. I agree with the Advanced Practitioner's note, impression and recommendations, and have recorded additional findings, impressions and recommendations below. I performed a substantive portion of this encounter (>50% time spent), including a complete performance of the medical decision making.  My additional thoughts are as follows:  Pain stable, still has an ileus on exam with distention, tympany and decreased bowel sounds, though passing some gas today.  Continue to replete and potassium, encourage ambulation as he has been  doing, decrease opioid analgesics.  If not significantly improved in about 48 hours, he will need TPN as that will mark a week of hospitalization.  We are giving some MiraLAX  to see if that jumpstart his bowels.  Fortunately no GI bleeding, no current plans for endoscopy.   Victory LITTIE Brand III Office:[phone]

## 2023-11-16 NOTE — Plan of Care (Signed)
   Problem: Education: Goal: Knowledge of General Education information will improve Description Including pain rating scale, medication(s)/side effects and non-pharmacologic comfort measures Outcome: Progressing

## 2023-11-16 NOTE — Plan of Care (Signed)

## 2023-11-17 DIAGNOSIS — K852 Alcohol induced acute pancreatitis without necrosis or infection: Secondary | ICD-10-CM | POA: Diagnosis not present

## 2023-11-17 LAB — CBC
HCT: 31.9 % — ABNORMAL LOW (ref 39.0–52.0)
Hemoglobin: 10.6 g/dL — ABNORMAL LOW (ref 13.0–17.0)
MCH: 31.3 pg (ref 26.0–34.0)
MCHC: 33.2 g/dL (ref 30.0–36.0)
MCV: 94.1 fL (ref 80.0–100.0)
Platelets: 189 K/uL (ref 150–400)
RBC: 3.39 MIL/uL — ABNORMAL LOW (ref 4.22–5.81)
RDW: 13 % (ref 11.5–15.5)
WBC: 8.9 K/uL (ref 4.0–10.5)
nRBC: 0 % (ref 0.0–0.2)

## 2023-11-17 LAB — COMPREHENSIVE METABOLIC PANEL WITH GFR
ALT: 14 U/L (ref 0–44)
AST: 19 U/L (ref 15–41)
Albumin: 2.8 g/dL — ABNORMAL LOW (ref 3.5–5.0)
Alkaline Phosphatase: 50 U/L (ref 38–126)
Anion gap: 13 (ref 5–15)
BUN: <5 mg/dL — ABNORMAL LOW (ref 8–23)
CO2: 22 mmol/L (ref 22–32)
Calcium: 8.4 mg/dL — ABNORMAL LOW (ref 8.9–10.3)
Chloride: 104 mmol/L (ref 98–111)
Creatinine, Ser: 0.96 mg/dL (ref 0.61–1.24)
GFR, Estimated: >60 mL/min (ref >60)
Glucose, Bld: 96 mg/dL (ref 70–99)
Potassium: 2.7 mmol/L — CL (ref 3.5–5.1)
Sodium: 139 mmol/L (ref 135–145)
Total Bilirubin: 1.5 mg/dL — ABNORMAL HIGH (ref 0.0–1.2)
Total Protein: 6.1 g/dL — ABNORMAL LOW (ref 6.5–8.1)

## 2023-11-17 LAB — MAGNESIUM: Magnesium: 1.5 mg/dL — ABNORMAL LOW (ref 1.7–2.4)

## 2023-11-17 MED ORDER — POTASSIUM CHLORIDE 10 MEQ/100ML IV SOLN
10.0000 meq | INTRAVENOUS | Status: AC
  Administered 2023-11-17 (×4): 10 meq via INTRAVENOUS
  Filled 2023-11-17 (×4): qty 100

## 2023-11-17 MED ORDER — MAGNESIUM SULFATE 2 GM/50ML IV SOLN
2.0000 g | Freq: Once | INTRAVENOUS | Status: AC
  Administered 2023-11-17: 2 g via INTRAVENOUS
  Filled 2023-11-17: qty 50

## 2023-11-17 MED ORDER — POTASSIUM CHLORIDE CRYS ER 20 MEQ PO TBCR
40.0000 meq | EXTENDED_RELEASE_TABLET | ORAL | Status: AC
  Administered 2023-11-17 (×2): 40 meq via ORAL
  Filled 2023-11-17 (×2): qty 2

## 2023-11-17 NOTE — Progress Notes (Signed)
 Date and time results received: 11/17/23 10:19 AM  (use smartphrase .now to insert current time)  Test: potassium Critical Value: 2.7  Name of Provider Notified: Dr. Mennie  Orders Received? Or Actions Taken?: Orders Received - See Orders for details

## 2023-11-17 NOTE — Progress Notes (Signed)
 Per patient, he had multiple bowel movements and passed a lot of gas during the shift. Patient also ambulated multiple times around the unit

## 2023-11-17 NOTE — Progress Notes (Signed)
 PROGRESS NOTE [name]  FMW:[id number] DOB: Apr 13, 1961 DOA: 11/11/2023 PCP: [name], NP  Brief Narrative/Hospital Course:  63 y.o. male with hx of alcohol use disorder with history of recurrent alcohol related pancreatitis, hypertension, hyperlipidemia, who presents with worsening upper abdominal pain.  Reports onset of symptoms yesterday morning after of night of heavy drinking the night before.  He is drinking about a pint of liquor or sixpack of beer per day.  Yesterday morning developed sudden onset of a band of upper abdominal pain which has been persistent since onset, not relieved with Tylenol  at home.  Pain remains severe despite dilaudid  in the ED. He has had nausea and vomiting with dark, questionable coffee-ground emesis.  Has also had recent dark stools.  Reports previously going to rehab for alcohol use but relapsed, he is interested in quitting drinking again and interested in medication assisted treatment for alcohol use disorder.  GI is following and is being managed conservatively with acute pancreatitis, complicated by ileus  Subjective: Patient seen examined this morning He reports his pain is improving, had bowel movement no nausea vomiting, abdomen is distended but slightly better Overnight afebrile and BP in 150s-160s, on room air Last bowel movement 7/12.  Labs pending subsequently resulted potassium was low at 2.7  Assessment and plan:  Alcoholic pancreatitis complicated by ileus Lactic acidosis Transaminitis likely alcohol related Hepatomegaly on imaging, hepatic steatosis: Patient with significant alcohol abuse binge drinking, likely the etiology of his underlying lactic acidosis transaminitis pancreatitis and dehydration. Managing conservatively with aggressive IV fluid hydration, antiemetics, PPI, IV pain management. GI following closely x-ray 7/11 showed ileus-keep mag above 2 potassium above 4-replacing aggressively minimize narcotics as much as  possible Cont to encourage to ambulate  Reported black stools/coffee-ground emesis  ?Duodenitis, or gastritis, alcohol related: GI input appreciated avoid NSAID aspirin  monitor CBC regularly continue PPI. Overall hemoglobin remains stable no evidence of bleeding. Recent Labs  Lab 11/13/23 0533 11/14/23 0729 11/15/23 0645 11/16/23 0703 11/17/23 0818  HGB 13.4 11.8* 11.7* 11.2* 10.6*  HCT 39.9 36.0* 35.5* 33.2* 31.9*    Severe alcohol use disorder, at risk of withdrawal: Drinking 1 pint of liquor, or sixpack per day. Previous success with rehabilitation program outpatient but relapsed. Appears motivated to quit TOC consulted continue on CIWA scale thiamine  multivitamin folate.  Hypokalemia Hypomagnesemia: K low again being replaced aggressively to keep >4 due to ileus  Recent Labs  Lab 11/13/23 0533 11/14/23 0729 11/15/23 0645 11/16/23 0703 11/17/23 0818  K 3.0* 3.5 3.7 3.2* 2.7*    Incidental findings: Aortic, coronary atherosclerosis Bosniak 2 renal cyst Microscopic hematuria Follow-up with PCP  Hypertension: BP acceptable.Continue to hold amlodipine , losartan , carvedilol  in the setting of volume depletion and lactic acidosis.  Hyperlipidemia: continue home atorvastatin    Class I Obesity w/ Body mass index is 38.18 kg/m.: Will benefit with PCP follow-up, weight loss,healthy lifestyle and outpatient sleep eval if not done.  DVT prophylaxis: enoxaparin  (LOVENOX ) injection 40 mg Start: 11/15/23 1200 SCDs Start: 11/12/23 0711: start lovenox  Code Status:   Code Status: Full Code Family Communication: plan of care discussed with patient at bedside. Patient status is: Remains hospitalized because of severity of illness Level of care: Telemetry Medical   Dispo: The patient is from:Home            Anticipated disposition: TBD Objective: Vitals last 24 hrs: Vitals:   11/16/23 1553 11/16/23 2019 11/17/23 0440 11/17/23 0756  BP: (!) 158/91 (!) 160/99  (!) 165/94   Pulse: 81  73  80  Resp: 16 17  18   Temp: 98.4 F (36.9 C) 97.9 F (36.6 C)  99.1 F (37.3 C)  TempSrc:  Oral    SpO2: 95% 96%  96%  Weight:   127.7 kg     Physical Examination: General exam: alert awake, oriented at baseline, older than stated age HEENT:Oral mucosa moist, Ear/Nose WNL grossly Respiratory system: Bilaterally clear BS,no use of accessory muscle Cardiovascular system: S1 & S2 +, No JVD. Gastrointestinal system: Abdomen soft moderately distended with mild tenderness , BS+ Nervous System: Alert, awake, moving all extremities,and following commands. Extremities: LE edema neg,distal peripheral pulses palpable and warm.  Skin: No rashes,no icterus. MSK: Normal muscle bulk,tone, power   Data Reviewed: I have personally reviewed following labs and imaging studies ( see epic result tab) CBC: Recent Labs  Lab 11/13/23 0533 11/14/23 0729 11/15/23 0645 11/16/23 0703 11/17/23 0818  WBC 19.2* 16.3* 14.6* 11.1* 8.9  HGB 13.4 11.8* 11.7* 11.2* 10.6*  HCT 39.9 36.0* 35.5* 33.2* 31.9*  MCV 94.3 95.0 95.7 95.4 94.1  PLT 151 128* 159 184 189   CMP: Recent Labs  Lab 11/13/23 0533 11/14/23 0729 11/15/23 0645 11/16/23 0703 11/17/23 0818  NA 139 142 138 139 139  K 3.0* 3.5 3.7 3.2* 2.7*  CL 106 110 105 104 104  CO2 23 20* 24 24 22   GLUCOSE 139* 139* 143* 113* 96  BUN 10 10 7* 5* <5*  CREATININE 1.00 1.01 0.88 0.95 0.96  CALCIUM  7.5* 7.9* 8.1* 8.4* 8.4*  MG 1.4* 1.9  --   --   --   PHOS 2.6  --   --   --   --    GFR: Estimated Creatinine Clearance: 111.6 mL/min (by C-G formula based on SCr of 0.96 mg/dL). Recent Labs  Lab 11/13/23 0533 11/14/23 0729 11/15/23 0645 11/16/23 0703 11/17/23 0818  AST 24 20 22 19 19   ALT 21 17 14 15 14   ALKPHOS 41 45 45 46 50  BILITOT 1.7* 1.4* 1.2 1.3* 1.5*  PROT 6.4* 6.4* 6.1* 6.2* 6.1*  ALBUMIN  3.3* 3.1* 2.8* 2.9* 2.8*    Recent Labs  Lab 11/11/23 1858  LIPASE 1,256*   No results for input(s): AMMONIA in the last 168  hours. Coagulation Profile:  Recent Labs  Lab 11/13/23 0533  INR 1.1   Unresulted Labs (From admission, onward)     Start     Ordered   11/22/23 0500  Creatinine, serum  (enoxaparin  (LOVENOX )    CrCl >/= 30 ml/min)  Weekly,   R     Comments: while on enoxaparin  therapy    11/15/23 1051   11/17/23 1019  Magnesium   Add-on,   AD        11/17/23 1018   11/14/23 0500  CBC  Daily,   R      11/13/23 0850   11/14/23 0500  Comprehensive metabolic panel with GFR  Daily,   R      11/13/23 0850           Antimicrobials/Microbiology: Anti-infectives (From admission, onward)    None         Component Value Date/Time   SDES URINE, RANDOM 06/17/2020 2010   SPECREQUEST  06/17/2020 2010    NONE Performed at [facility], [address] [place], [place] [address]    CULT >=100,000 COLONIES/mL ESCHERICHIA COLI (A) 06/17/2020 2010   REPTSTATUS 06/20/2020 FINAL 06/17/2020 2010    Procedures:  Medications reviewed: Scheduled Meds:  atorvastatin   40 mg Oral Daily   enoxaparin  (LOVENOX ) injection  40 mg Subcutaneous Q24H   folic acid   1 mg Oral Daily   multivitamin with minerals  1 tablet Oral Daily   pantoprazole  (PROTONIX ) IV  40 mg Intravenous Q12H   polyethylene glycol  17 g Oral Daily   sodium chloride  flush  3 mL Intravenous Q12H   thiamine   100 mg Oral Daily   Or   thiamine   100 mg Intravenous Daily   Continuous Infusions:  sodium chloride  1,000 mL (11/17/23 0538)    Mennie LAMY, MD [facility] 11/17/2023, 2:57 PM

## 2023-11-17 NOTE — Progress Notes (Addendum)
 Daily Progress Note  DOA: 11/11/2023 Hospital Day: 7   Cc: Acute alcohol related pancreatitis and ileus   ASSESSMENT     63 year old male admitted with upper abdominal pain and acute pancreatitis. Hospital course complicated by ileus >>TODAY: WBC Normal, hypokalemia is worse today.  Hasn't had any narcotics today and from a pancreatitis standpoint seems to be doing fairly well.    Ileus  In setting of acute pancreatitis, decreased mobility and narcotics.   >> TODAY: KUB not done ( I forgot to order) but will hold off for now. Clinically he is improving. Passing flatus, has had a couple of BMs since yesterday. Belly less distended and softer today overall. Still some generalized upper abdominal distention and abnormal bowel sounds but he isn't having any N/V today . Tolerating clears. Hasn't had any narcotics today   Chronic mild hyperbilirubinema , predominantly indirect  Stable.   Hepatic steatosis but no other hepatobiliary findings on CT scan. Probably Etoh related , Gilbert's syndrome another consideration   Hypokalemia  >> TODAY -  K+ worse (2.7) despite ongoing repletion .  Repletion still in progress (IV and PO K+)  Principal Problem:   Alcoholic pancreatitis Active Problems:   Melena   Upper abdominal pain   Ileus (HCC)   PLAN   --Continue daily Miralax , ambulation --Continue maintenance IV fluids for now --Potassium repletion still in progress. Goal K+ > 4.  --Fortunately he has been able to do without narcotics today --Trial of full liquids. Advised to take slowly and stop if any significant N/V, worsening abdominal distention. If not able to tolerate full liquids then consider starting TNA   Subjective   Passing flatus, has had a couple of BMs since yesterday. Belly less distended  Still some generalized upper abdominal distention but no N/V . Tolerating clears.  Has been ambulating   Objective   GI Studies:    Recent Labs    11/15/23 0645  11/16/23 0703 11/17/23 0818  WBC 14.6* 11.1* 8.9  HGB 11.7* 11.2* 10.6*  HCT 35.5* 33.2* 31.9*  MCV 95.7 95.4 94.1  PLT 159 184 189   No results for input(s): FOLATE, VITAMINB12, FERRITIN, TIBC, IRONPCTSAT in the last 72 hours. Recent Labs    11/15/23 0645 11/16/23 0703 11/17/23 0818  NA 138 139 139  K 3.7 3.2* 2.7*  CL 105 104 104  CO2 24 24 22   GLUCOSE 143* 113* 96  BUN 7* 5* <5*  CREATININE 0.88 0.95 0.96  CALCIUM  8.1* 8.4* 8.4*   Recent Labs    11/15/23 0645 11/16/23 0703 11/17/23 0818  PROT 6.1* 6.2* 6.1*  ALBUMIN  2.8* 2.9* 2.8*  AST 22 19 19   ALT 14 15 14   ALKPHOS 45 46 50  BILITOT 1.2 1.3* 1.5*      Imaging:  DG Abd 1 View CLINICAL DATA:  [id number] Ileus (HCC) [id number]  EXAM: ABDOMEN - 1 VIEW  COMPARISON:  November 15, 2023  FINDINGS: Marked gaseous dilation of a loop of favored colon in the RIGHT hemiabdomen to proximally 13.5 cm. Incomplete visualization of the abdomen. Extent of this dilation appears increased since prior. There is diffuse gaseous distension of other visualized loops of large and small bowel. Status post posterior fixation of the lower lumbar spine. Pelvic phleboliths. No definitive air is visualized in the rectum.  IMPRESSION: Marked gaseous dilation of a loop bowel in the RIGHT hemiabdomen, favored to reflect a markedly dilated cecum. Extent of this dilation appears increased since prior. In the  setting of adjacent pancreatitis, this likely reflects colonic ileus. However if concern for interval obstruction, this would be better assessed with dedicated CT abdomen pelvis with contrast.  Electronically Signed   By: Corean Salter M.D.   On: 11/16/2023 17:44     Scheduled inpatient medications:   atorvastatin   40 mg Oral Daily   enoxaparin  (LOVENOX ) injection  40 mg Subcutaneous Q24H   folic acid   1 mg Oral Daily   multivitamin with minerals  1 tablet Oral Daily   pantoprazole  (PROTONIX ) IV  40 mg Intravenous  Q12H   polyethylene glycol  17 g Oral Daily   sodium chloride  flush  3 mL Intravenous Q12H   thiamine   100 mg Oral Daily   Or   thiamine   100 mg Intravenous Daily   Continuous inpatient infusions:   sodium chloride  1,000 mL (11/17/23 0538)   potassium chloride  10 mEq (11/17/23 1356)   PRN inpatient medications: acetaminophen , albuterol , bisacodyl , HYDROmorphone  (DILAUDID ) injection **OR** HYDROmorphone  (DILAUDID ) injection, melatonin, ondansetron  (ZOFRAN ) IV  Vital signs in last 24 hours: Temp:  [97.9 F (36.6 C)-99.1 F (37.3 C)] 99.1 F (37.3 C) (07/13 0756) Pulse Rate:  [73-81] 80 (07/13 0756) Resp:  [16-18] 18 (07/13 0756) BP: (158-165)/(91-99) 165/94 (07/13 0756) SpO2:  [95 %-96 %] 96 % (07/13 0756) Weight:  [127.7 kg] 127.7 kg (07/13 0440) Last BM Date : 11/17/23  Intake/Output Summary (Last 24 hours) at 11/17/2023 1444 Last data filed at 11/16/2023 2131 Gross per 24 hour  Intake 2761.66 ml  Output --  Net 2761.66 ml    Intake/Output from previous day: 07/12 0701 - 07/13 0700 In: 2761.7 [I.V.:2482.4; IV Piggyback:279.3] Out: -  Intake/Output this shift: No intake/output data recorded.   Physical Exam:  General: Alert male in NAD Heart:  Regular rate and rhythm.  Pulmonary: Normal respiratory effort Abdomen: Soft, nontender . Ggeneralized upper abdominal distention with abnormal bowel sounds . Lower abdomen soft , nontender with normal bowel sounds  Extremities: No lower extremity edema  Neurologic: Alert and oriented Psych: Pleasant. Cooperative     LOS: 5 days   Vina Dasen ,NP 11/17/2023, 2:44 PM   I have taken an interval history, thoroughly reviewed the chart and examined the patient. I agree with the Advanced Practitioner's note, impression and recommendations, and have recorded additional findings, impressions and recommendations below. I performed a substantive portion of this encounter (>50% time spent), including a complete performance of the  medical decision making.  My additional thoughts are as follows:  Pancreatitis related ileus seems to be slowly improving, so he may not need TPN.  We will slowly and cautiously try advancing diet.  Narcotics have been off and he needs further potassium repletion as well as continued ambulation to help the ileus fully resolved.  Dr. Suzann will take over the consult service starting tomorrow and we will continue to follow.   Victory LITTIE Brand III Office:[phone]

## 2023-11-17 NOTE — Plan of Care (Signed)
   Problem: Education: Goal: Knowledge of General Education information will improve Description Including pain rating scale, medication(s)/side effects and non-pharmacologic comfort measures Outcome: Progressing   Problem: Health Behavior/Discharge Planning: Goal: Ability to manage health-related needs will improve Outcome: Progressing

## 2023-11-17 NOTE — Plan of Care (Signed)

## 2023-11-18 DIAGNOSIS — K852 Alcohol induced acute pancreatitis without necrosis or infection: Secondary | ICD-10-CM | POA: Diagnosis not present

## 2023-11-18 LAB — COMPREHENSIVE METABOLIC PANEL WITH GFR
ALT: 19 U/L (ref 0–44)
AST: 26 U/L (ref 15–41)
Albumin: 3.1 g/dL — ABNORMAL LOW (ref 3.5–5.0)
Alkaline Phosphatase: 58 U/L (ref 38–126)
Anion gap: 11 (ref 5–15)
BUN: <5 mg/dL — ABNORMAL LOW (ref 8–23)
CO2: 24 mmol/L (ref 22–32)
Calcium: 8.6 mg/dL — ABNORMAL LOW (ref 8.9–10.3)
Chloride: 104 mmol/L (ref 98–111)
Creatinine, Ser: 0.87 mg/dL (ref 0.61–1.24)
GFR, Estimated: >60 mL/min (ref >60)
Glucose, Bld: 126 mg/dL — ABNORMAL HIGH (ref 70–99)
Potassium: 2.7 mmol/L — CL (ref 3.5–5.1)
Sodium: 139 mmol/L (ref 135–145)
Total Bilirubin: 1.3 mg/dL — ABNORMAL HIGH (ref 0.0–1.2)
Total Protein: 6.5 g/dL (ref 6.5–8.1)

## 2023-11-18 LAB — BASIC METABOLIC PANEL WITH GFR
Anion gap: 11 (ref 5–15)
BUN: <5 mg/dL — ABNORMAL LOW (ref 8–23)
CO2: 25 mmol/L (ref 22–32)
Calcium: 8.7 mg/dL — ABNORMAL LOW (ref 8.9–10.3)
Chloride: 105 mmol/L (ref 98–111)
Creatinine, Ser: 0.87 mg/dL (ref 0.61–1.24)
GFR, Estimated: >60 mL/min (ref >60)
Glucose, Bld: 125 mg/dL — ABNORMAL HIGH (ref 70–99)
Potassium: 3.7 mmol/L (ref 3.5–5.1)
Sodium: 141 mmol/L (ref 135–145)

## 2023-11-18 LAB — CBC
HCT: 31.8 % — ABNORMAL LOW (ref 39.0–52.0)
Hemoglobin: 10.7 g/dL — ABNORMAL LOW (ref 13.0–17.0)
MCH: 30.9 pg (ref 26.0–34.0)
MCHC: 33.6 g/dL (ref 30.0–36.0)
MCV: 91.9 fL (ref 80.0–100.0)
Platelets: 217 K/uL (ref 150–400)
RBC: 3.46 MIL/uL — ABNORMAL LOW (ref 4.22–5.81)
RDW: 13.2 % (ref 11.5–15.5)
WBC: 9.2 K/uL (ref 4.0–10.5)
nRBC: 0.2 % (ref 0.0–0.2)

## 2023-11-18 LAB — PHOSPHORUS: Phosphorus: 2.5 mg/dL (ref 2.5–4.6)

## 2023-11-18 LAB — MAGNESIUM: Magnesium: 1.7 mg/dL (ref 1.7–2.4)

## 2023-11-18 MED ORDER — POTASSIUM CHLORIDE 10 MEQ/100ML IV SOLN
10.0000 meq | INTRAVENOUS | Status: AC
  Administered 2023-11-18 (×2): 10 meq via INTRAVENOUS
  Filled 2023-11-18 (×2): qty 100

## 2023-11-18 MED ORDER — POTASSIUM CHLORIDE IN NACL 20-0.9 MEQ/L-% IV SOLN
INTRAVENOUS | Status: AC
  Filled 2023-11-18 (×3): qty 1000

## 2023-11-18 MED ORDER — AMLODIPINE BESYLATE 10 MG PO TABS
10.0000 mg | ORAL_TABLET | Freq: Every day | ORAL | Status: DC
  Administered 2023-11-18 – 2023-11-20 (×3): 10 mg via ORAL
  Filled 2023-11-18 (×3): qty 1

## 2023-11-18 MED ORDER — CARVEDILOL 25 MG PO TABS
25.0000 mg | ORAL_TABLET | Freq: Two times a day (BID) | ORAL | Status: DC
  Administered 2023-11-18 – 2023-11-20 (×5): 25 mg via ORAL
  Filled 2023-11-18 (×5): qty 1

## 2023-11-18 MED ORDER — POTASSIUM CHLORIDE CRYS ER 20 MEQ PO TBCR
40.0000 meq | EXTENDED_RELEASE_TABLET | ORAL | Status: AC
  Administered 2023-11-18 (×3): 40 meq via ORAL
  Filled 2023-11-18 (×3): qty 2

## 2023-11-18 MED ORDER — MAGNESIUM SULFATE 2 GM/50ML IV SOLN
2.0000 g | Freq: Once | INTRAVENOUS | Status: AC
  Administered 2023-11-18: 2 g via INTRAVENOUS
  Filled 2023-11-18: qty 50

## 2023-11-18 NOTE — Plan of Care (Signed)
   Problem: Education: Goal: Knowledge of General Education information will improve Description Including pain rating scale, medication(s)/side effects and non-pharmacologic comfort measures Outcome: Progressing

## 2023-11-18 NOTE — Progress Notes (Addendum)
 Inpatient Progress Note     Patient Profile/Chief Complaint  63 year old male admitted with acute alcohol related pancreatitis complicated by ileus   Interval History   -- Reports having 2-3 bowel movements yesterday and 1 this morning -- Passing flatus and has had a reduction in abdominal distention -- Abdominal pain also improved -- No fevers, chills, nausea or vomiting -- Labs show persistent hypokalemia with potassium 2.7    Objective   Vital signs in last 24 hours: Temp:  [98.5 F (36.9 C)-99.3 F (37.4 C)] 99.3 F (37.4 C) (07/14 0724) Pulse Rate:  [69-77] 76 (07/14 0724) Resp:  [16-19] 17 (07/14 0724) BP: (165-184)/(90-95) 174/90 (07/14 0724) SpO2:  [94 %-97 %] 94 % (07/14 0724) Weight:  [126.6 kg] 126.6 kg (07/14 0500) Last BM Date : 11/18/23 General:    Alert, resting in bed no distress Heart:  Regular rate and rhythm; no murmurs Lungs: Respirations even and unlabored, lungs CTA bilaterally Abdomen:  Soft, mild tenderness to palpation of epigastrium with accompanying mild abdominal distention normal bowel sounds. Extremities:  Without edema. Neurologic:  Alert and oriented,  grossly normal neurologically. Psych:  Cooperative. Normal mood and affect.  Intake/Output from previous day: 07/13 0701 - 07/14 0700 In: 4342.4 [P.O.:480; I.V.:3432.3; IV Piggyback:430.1] Out: -  Intake/Output this shift: No intake/output data recorded.  Lab Results: Recent Labs    11/16/23 0703 11/17/23 0818 11/18/23 0705  WBC 11.1* 8.9 9.2  HGB 11.2* 10.6* 10.7*  HCT 33.2* 31.9* 31.8*  PLT 184 189 217   BMET Recent Labs    11/16/23 0703 11/17/23 0818 11/18/23 0705  NA 139 139 139  K 3.2* 2.7* 2.7*  CL 104 104 104  CO2 24 22 24   GLUCOSE 113* 96 126*  BUN 5* <5* <5*  CREATININE 0.95 0.96 0.87  CALCIUM  8.4* 8.4* 8.6*   LFT Recent Labs    11/18/23 0705  PROT 6.5  ALBUMIN  3.1*  AST 26  ALT 19  ALKPHOS 58  BILITOT 1.3*   PT/INR No results for input(s):  LABPROT, INR in the last 72 hours.  Studies/Results: DG Abd 1 View Result Date: 11/16/2023 CLINICAL DATA:  [id number] Ileus [facility]) [id number] EXAM: ABDOMEN - 1 VIEW COMPARISON:  November 15, 2023 FINDINGS: Marked gaseous dilation of a loop of favored colon in the RIGHT hemiabdomen to proximally 13.5 cm. Incomplete visualization of the abdomen. Extent of this dilation appears increased since prior. There is diffuse gaseous distension of other visualized loops of large and small bowel. Status post posterior fixation of the lower lumbar spine. Pelvic phleboliths. No definitive air is visualized in the rectum. IMPRESSION: Marked gaseous dilation of a loop bowel in the RIGHT hemiabdomen, favored to reflect a markedly dilated cecum. Extent of this dilation appears increased since prior. In the setting of adjacent pancreatitis, this likely reflects colonic ileus. However if concern for interval obstruction, this would be better assessed with dedicated CT abdomen pelvis with contrast. Electronically Signed   By: Corean Salter M.D.   On: 11/16/2023 17:44    Endoscopic Studies: None   Clinical Impression   63 year old male admitted with acute alcohol related pancreatitis complicated by ileus.  Mr. Clausen has had persistent hypokalemia over the last 48 hours with potassium 2.7 that is being repleted.  Magnesium  and phosphorus are normal.  Despite ongoing hypokalemia, he notes clinical improvement in his symptoms with reduction in abdominal distention, pain.  He is passing flatus and stools.  States that he has not needed narcotics in  the last 24 hours.   Plan  -- Continue potassium repletion to goal potassium equal to or greater than 4 -- Continue maintenance IV fluids -- Continue full liquid diet and will consider advancement over the next 24 hours if he continues to clinically improve and potassium normalizes -- Encourage ambulation -- Continue MiraLAX     LOS: 6 days   Inocente CHRISTELLA Hausen  11/18/2023, 1:34  PM  Inocente Hausen, MD [facility]

## 2023-11-18 NOTE — Progress Notes (Signed)
 PROGRESS NOTE [name]  FMW:[id number] DOB: [birth year] DOA: 11/11/2023 PCP: Martin Rosaline SQUIBB, NP  Brief Narrative/Hospital Course:  63 y.o. male with hx of alcohol use disorder with history of recurrent alcohol related pancreatitis, hypertension, hyperlipidemia, who presents with worsening upper abdominal pain.  Reports onset of symptoms yesterday morning after of night of heavy drinking the night before.  He is drinking about a pint of liquor or sixpack of beer per day.  Yesterday morning developed sudden onset of a band of upper abdominal pain which has been persistent since onset, not relieved with Tylenol  at home.  Pain remains severe despite dilaudid  in the ED. He has had nausea and vomiting with dark, questionable coffee-ground emesis.  Has also had recent dark stools.  Reports previously going to rehab for alcohol use but relapsed, he is interested in quitting drinking again and interested in medication assisted treatment for alcohol use disorder.  GI is following and is being managed conservatively with acute pancreatitis, complicated by ileus  Subjective: Seen and examined  Reports he is tolerating diet, having bowel movement abdomen feels less distended and softer  Labs this morning shows critically low potassium BP has been trending up  Assessment and plan:  Alcoholic pancreatitis complicated by ileus Lactic acidosis Transaminitis likely alcohol related Hepatomegaly on imaging, hepatic steatosis: Patient with significant alcohol abuse binge drinking, likely the etiology of his underlying lactic acidosis transaminitis pancreatitis and dehydration. Managing conservatively with aggressive IV fluid hydration, antiemetics, PPI, IV pain management. GI following closely x-ray 7/11 showed ileus-keep mag above 2 potassium above 4-replacing aggressively minimize narcotics as much as possible.  Diet advanced to FLD, ADA diet as per GI  Reported black stools/coffee-ground emesis   ?Duodenitis, or gastritis, alcohol related: No evidence of bleeding, continue PPI. Hb stable  Severe alcohol use disorder, at risk of withdrawal: Drinking 1 pint of liquor, or sixpack per day. Previous success with rehabilitation program outpatient but relapsed. Appears motivated to quit TOC consulted continue on CIWA scale thiamine  multivitamin folate.  Hypokalemia Hypomagnesemia: Mag improved to 1.7-add additional 2 g mag rider, potassium is still low- will replace aggressively with p.o. x 3 doses 40 mEq in IV 10 mEq x2, add kcl on ivf nss infusion> recheck labs later today Recent Labs  Lab 11/14/23 0729 11/15/23 0645 11/16/23 0703 11/17/23 0818 11/18/23 0705  K 3.5 3.7 3.2* 2.7* 2.7*    Hypertension: BP uncontrolled and trending up.  Will resume amlodipine  Coreg , hold off on losartan   Hyperlipidemia: continue home atorvastatin   Incidental findings: Aortic, coronary atherosclerosis Bosniak 2 renal cyst Microscopic hematuria Follow-up with PCP   Class I Obesity w/ Body mass index is 37.85 kg/m.: Will benefit with PCP follow-up, weight loss,healthy lifestyle and outpatient sleep eval if not done.  DVT prophylaxis: enoxaparin  (LOVENOX ) injection 40 mg Start: 11/15/23 1200 SCDs Start: 11/12/23 0711: start lovenox  Code Status:   Code Status: Full Code Family Communication: plan of care discussed with patient at bedside. Patient status is: Remains hospitalized because of severity of illness Level of care: Telemetry Medical   Dispo: The patient is from:Home            Anticipated disposition: TBD Objective: Vitals last 24 hrs: Vitals:   11/17/23 2201 11/18/23 0359 11/18/23 0500 11/18/23 0724  BP: (!) 184/95 (!) 170/91  (!) 174/90  Pulse: 77 69  76  Resp: 19 16  17   Temp: 98.5 F (36.9 C) 98.9 F (37.2 C)  99.3 F (37.4 C)  TempSrc: Oral Oral  Oral  SpO2: 96% 97%  94%  Weight:   126.6 kg     Physical Examination: General exam: alert awake, oriented HEENT:Oral  mucosa moist, Ear/Nose WNL grossly Respiratory system: Bilaterally clear BS,no use of accessory muscle Cardiovascular system: S1 & S2 +, No JVD. Gastrointestinal system: Abdomen soft moderate distention mild tenderness, BS+ Nervous System: Alert, awake, moving all extremities,and following commands. Extremities: LE edema neg,distal peripheral pulses palpable and warm.  Skin: No rashes,no icterus. MSK: Normal muscle bulk,tone, power   Data Reviewed: I have personally reviewed following labs and imaging studies ( see epic result tab) CBC: Recent Labs  Lab 11/14/23 0729 11/15/23 0645 11/16/23 0703 11/17/23 0818 11/18/23 0705  WBC 16.3* 14.6* 11.1* 8.9 9.2  HGB 11.8* 11.7* 11.2* 10.6* 10.7*  HCT 36.0* 35.5* 33.2* 31.9* 31.8*  MCV 95.0 95.7 95.4 94.1 91.9  PLT 128* 159 184 189 217   CMP: Recent Labs  Lab 11/13/23 0533 11/14/23 0729 11/15/23 0645 11/16/23 0703 11/17/23 0818 11/17/23 1100 11/18/23 0705 11/18/23 0717  NA 139 142 138 139 139  --  139  --   K 3.0* 3.5 3.7 3.2* 2.7*  --  2.7*  --   CL 106 110 105 104 104  --  104  --   CO2 23 20* 24 24 22   --  24  --   GLUCOSE 139* 139* 143* 113* 96  --  126*  --   BUN 10 10 7* 5* <5*  --  <5*  --   CREATININE 1.00 1.01 0.88 0.95 0.96  --  0.87  --   CALCIUM  7.5* 7.9* 8.1* 8.4* 8.4*  --  8.6*  --   MG 1.4* 1.9  --   --   --  1.5*  --  1.7  PHOS 2.6  --   --   --   --   --   --  2.5   GFR: Estimated Creatinine Clearance: 122.6 mL/min (by C-G formula based on SCr of 0.87 mg/dL). Recent Labs  Lab 11/14/23 0729 11/15/23 0645 11/16/23 0703 11/17/23 0818 11/18/23 0705  AST 20 22 19 19 26   ALT 17 14 15 14 19   ALKPHOS 45 45 46 50 58  BILITOT 1.4* 1.2 1.3* 1.5* 1.3*  PROT 6.4* 6.1* 6.2* 6.1* 6.5  ALBUMIN  3.1* 2.8* 2.9* 2.8* 3.1*    Recent Labs  Lab 11/11/23 1858  LIPASE 1,256*   No results for input(s): AMMONIA in the last 168 hours. Coagulation Profile:  Recent Labs  Lab 11/13/23 0533  INR 1.1   Unresulted Labs  (From admission, onward)     Start     Ordered   11/22/23 0500  Creatinine, serum  (enoxaparin  (LOVENOX )    CrCl >/= 30 ml/min)  Weekly,   R     Comments: while on enoxaparin  therapy    11/15/23 1051   11/18/23 2100  Basic metabolic panel  Once,   R        11/18/23 0826           Antimicrobials/Microbiology: Anti-infectives (From admission, onward)    None         Component Value Date/Time   SDES URINE, RANDOM 06/17/2020 2010   SPECREQUEST  06/17/2020 2010    NONE Performed at [facility], [address], [place], [place] [address]    CULT >=100,000 COLONIES/mL ESCHERICHIA COLI (A) 06/17/2020 2010   REPTSTATUS 06/20/2020 FINAL 06/17/2020 2010    Procedures:  Medications reviewed: Scheduled Meds:  amLODipine   10 mg Oral Daily   atorvastatin   40 mg Oral Daily   carvedilol   25 mg Oral BID WC   enoxaparin  (LOVENOX ) injection  40 mg Subcutaneous Q24H   folic acid   1 mg Oral Daily   multivitamin with minerals  1 tablet Oral Daily   pantoprazole  (PROTONIX ) IV  40 mg Intravenous Q12H   polyethylene glycol  17 g Oral Daily   potassium chloride   40 mEq Oral Q3H   sodium chloride  flush  3 mL Intravenous Q12H   thiamine   100 mg Oral Daily   Or   thiamine   100 mg Intravenous Daily   Continuous Infusions:  0.9 % NaCl with KCl 20 mEq / L 125 mL/hr at 11/18/23 0912   magnesium  sulfate bolus IVPB 2 g (11/18/23 0927)   potassium chloride  10 mEq (11/18/23 0930)    Mennie LAMY, MD [facility] 11/18/2023, 10:18 AM

## 2023-11-19 DIAGNOSIS — K852 Alcohol induced acute pancreatitis without necrosis or infection: Secondary | ICD-10-CM | POA: Diagnosis not present

## 2023-11-19 MED ORDER — PANTOPRAZOLE SODIUM 40 MG PO TBEC
40.0000 mg | DELAYED_RELEASE_TABLET | Freq: Two times a day (BID) | ORAL | Status: DC
  Administered 2023-11-19 – 2023-11-20 (×2): 40 mg via ORAL
  Filled 2023-11-19 (×2): qty 1

## 2023-11-19 NOTE — Progress Notes (Signed)
    Inpatient Progress Note     Patient Profile/Chief Complaint  63 year old male admitted with acute alcohol related pancreatitis complicated by ileus   Interval History   -- Continuing to pass stools and flatus -- Abdominal distention improved -- Potassium now corrected    Objective   Vital signs in last 24 hours: Temp:  [98.5 F (36.9 C)-99.3 F (37.4 C)] 99.3 F (37.4 C) (07/15 0821) Pulse Rate:  [72-82] 82 (07/15 0821) Resp:  [16-18] 18 (07/15 0821) BP: (150-162)/(80-91) 150/89 (07/15 0821) SpO2:  [95 %-97 %] 95 % (07/15 0821) Last BM Date : 11/19/23 General:    Alert, resting in bed no distress Heart:  Regular rate and rhythm; no murmurs Lungs: Respirations even and unlabored, lungs CTA bilaterally Abdomen:  Soft, nontender, nondistended, normoactive bowel sounds Extremities:  Without edema. Neurologic:  Alert and oriented,  grossly normal neurologically. Psych:  Cooperative. Normal mood and affect.  Intake/Output from previous day: 07/14 0701 - 07/15 0700 In: 536 [I.V.:436; IV Piggyback:100] Out: -  Intake/Output this shift: No intake/output data recorded.  Lab Results: Recent Labs    11/17/23 0818 11/18/23 0705  WBC 8.9 9.2  HGB 10.6* 10.7*  HCT 31.9* 31.8*  PLT 189 217   BMET Recent Labs    11/17/23 0818 11/18/23 0705 11/18/23 2034  NA 139 139 141  K 2.7* 2.7* 3.7  CL 104 104 105  CO2 22 24 25   GLUCOSE 96 126* 125*  BUN <5* <5* <5*  CREATININE 0.96 0.87 0.87  CALCIUM  8.4* 8.6* 8.7*   LFT Recent Labs    11/18/23 0705  PROT 6.5  ALBUMIN  3.1*  AST 26  ALT 19  ALKPHOS 58  BILITOT 1.3*   PT/INR No results for input(s): LABPROT, INR in the last 72 hours.  Studies/Results: No results found.   Endoscopic Studies: None   Clinical Impression   63 year old male admitted with acute alcohol related pancreatitis complicated by ileus.  Mr. Faux has demonstrated consistent clinical progress.  His ileus appears to be resolving  passing flatus and bowel movements.  Abdominal distention is also improved.  Potassium has now been corrected.  His abdominal pain has decreased.  Discussed that he could advance his diet to a low-fat diet today.   Plan  -- Advance to low-fat diet and assess tolerance -- Continue to monitor stools and flatus -- Monitor electrolytes -- Encourage ambulation -- Continue MiraLAX  -- Discussed with primary team that if he tolerates low-fat diet may be able to consider discharge later today or tomorrow.    LOS: 7 days   Inocente CHRISTELLA Hausen  11/19/2023, 4:01 PM  Inocente Hausen, MD  GI

## 2023-11-19 NOTE — Progress Notes (Signed)
 PROGRESS NOTE [name]  FMW:[id number] DOB: [birth year] DOA: 11/11/2023 PCP: [name], NP  Brief Narrative/Hospital Course:  63 y.o. male with hx of alcohol use disorder with history of recurrent alcohol related pancreatitis, hypertension, hyperlipidemia, who presents with worsening upper abdominal pain.  Reports onset of symptoms yesterday morning after of night of heavy drinking the night before.  He is drinking about a pint of liquor or sixpack of beer per day.  Yesterday morning developed sudden onset of a band of upper abdominal pain which has been persistent since onset, not relieved with Tylenol  at home.  Pain remains severe despite dilaudid  in the ED. He has had nausea and vomiting with dark, questionable coffee-ground emesis.  Has also had recent dark stools.  Reports previously going to rehab for alcohol use but relapsed, he is interested in quitting drinking again and interested in medication assisted treatment for alcohol use disorder.  GI is following and is being managed conservatively with acute pancreatitis, complicated by ileus  Subjective: Patient seen examined this morning He denies nausea and vomiting Overnight afebrile BP fairly okay in 150s  Labs reviewed electrolytes as normalized He is on full liquid diet, having bowel movement last 7/14, eager to advance diet  Assessment and plan:  Alcoholic pancreatitis complicated by ileus Lactic acidosis Transaminitis likely alcohol related Hepatomegaly on imaging, hepatic steatosis: Patient with significant alcohol abuse binge drinking, likely the etiology of his underlying lactic acidosis transaminitis pancreatitis and dehydration AND BEING MAANGED conservatively with aggressive IV fluid hydration, antiemetics, PPI, IV pain management.  Will provide liquid diet, will advance diet, having bowel movement overall clinically improving KEEP MAG>2, K >4 minimize narcotics as much as possible.Continue MiraLAX .  Reported black  stools/coffee-ground emesis  ?Duodenitis, or gastritis, alcohol related: No evidence of bleeding, continue PPI. Hb stable  Severe alcohol use disorder, at risk of withdrawal: Drinking 1 pint of liquor, or sixpack per day. Previous success with rehabilitation program outpatient but relapsed. Appears motivated to quit TOC consulted continue on CIWA scale thiamine  multivitamin folate.  Hypokalemia Hypomagnesemia: Electrolytes have normalized, continue IVF with potassium.  Monitor  Recent Labs  Lab 11/15/23 0645 11/16/23 0703 11/17/23 0818 11/18/23 0705 11/18/23 2034  K 3.7 3.2* 2.7* 2.7* 3.7    Hypertension: BP was uncontrolled, Resumed amlodipine  Coreg  7/14 AND IMPROVING.hold off on losartan   Hyperlipidemia: continue home atorvastatin   Incidental findings: Aortic, coronary atherosclerosis Bosniak 2 renal cyst Microscopic hematuria Follow-up with PCP   Class I Obesity w/ Body mass index is 37.85 kg/m.: Will benefit with PCP follow-up, weight loss,healthy lifestyle and outpatient sleep eval if not done.  DVT prophylaxis: enoxaparin  (LOVENOX ) injection 40 mg Start: 11/15/23 1200 SCDs Start: 11/12/23 0711: start lovenox  Code Status:   Code Status: Full Code Family Communication: plan of care discussed with patient at bedside. Patient status is: Remains hospitalized because of severity of illness Level of care: Telemetry Medical   Dispo: The patient is from:Home            Anticipated disposition: Hopefully home today or tomorrow  Objective: Vitals last 24 hrs: Vitals:   11/18/23 1426 11/18/23 2022 11/19/23 0559 11/19/23 0821  BP: (!) 142/83 (!) 160/80 (!) 162/91 (!) 150/89  Pulse: 76 75 72 82  Resp:  17 16 18   Temp: 98.1 F (36.7 C) 98.6 F (37 C) 98.5 F (36.9 C) 99.3 F (37.4 C)  TempSrc: Oral Oral Oral Oral  SpO2: 93% 95% 97% 95%  Weight:        Physical  Examination: General exam: alert awake, oriented at baseline, older than stated age HEENT:Oral mucosa  moist, Ear/Nose WNL grossly Respiratory system: Bilaterally clear BS,no use of accessory muscle Cardiovascular system: S1 & S2 +, No JVD. Gastrointestinal system: Abdomen soft distended,NT BS+ Nervous System: Alert, awake, moving all extremities,and following commands. Extremities: LE edema neg,distal peripheral pulses palpable and warm.  Skin: No rashes,no icterus. MSK: Normal muscle bulk,tone, power   Data Reviewed: I have personally reviewed following labs and imaging studies ( see epic result tab) CBC: Recent Labs  Lab 11/14/23 0729 11/15/23 0645 11/16/23 0703 11/17/23 0818 11/18/23 0705  WBC 16.3* 14.6* 11.1* 8.9 9.2  HGB 11.8* 11.7* 11.2* 10.6* 10.7*  HCT 36.0* 35.5* 33.2* 31.9* 31.8*  MCV 95.0 95.7 95.4 94.1 91.9  PLT 128* 159 184 189 217   CMP: Recent Labs  Lab 11/13/23 0533 11/14/23 0729 11/15/23 0645 11/16/23 0703 11/17/23 0818 11/17/23 1100 11/18/23 0705 11/18/23 0717 11/18/23 2034  NA 139 142 138 139 139  --  139  --  141  K 3.0* 3.5 3.7 3.2* 2.7*  --  2.7*  --  3.7  CL 106 110 105 104 104  --  104  --  105  CO2 23 20* 24 24 22   --  24  --  25  GLUCOSE 139* 139* 143* 113* 96  --  126*  --  125*  BUN 10 10 7* 5* <5*  --  <5*  --  <5*  CREATININE 1.00 1.01 0.88 0.95 0.96  --  0.87  --  0.87  CALCIUM  7.5* 7.9* 8.1* 8.4* 8.4*  --  8.6*  --  8.7*  MG 1.4* 1.9  --   --   --  1.5*  --  1.7  --   PHOS 2.6  --   --   --   --   --   --  2.5  --    GFR: Estimated Creatinine Clearance: 122.6 mL/min (by C-G formula based on SCr of 0.87 mg/dL). Recent Labs  Lab 11/14/23 0729 11/15/23 0645 11/16/23 0703 11/17/23 0818 11/18/23 0705  AST 20 22 19 19 26   ALT 17 14 15 14 19   ALKPHOS 45 45 46 50 58  BILITOT 1.4* 1.2 1.3* 1.5* 1.3*  PROT 6.4* 6.1* 6.2* 6.1* 6.5  ALBUMIN  3.1* 2.8* 2.9* 2.8* 3.1*    No results for input(s): LIPASE, AMYLASE in the last 168 hours.  No results for input(s): AMMONIA in the last 168 hours. Coagulation Profile:  Recent Labs  Lab  11/13/23 0533  INR 1.1   Unresulted Labs (From admission, onward)     Start     Ordered   11/22/23 0500  Creatinine, serum  (enoxaparin  (LOVENOX )    CrCl >/= 30 ml/min)  Weekly,   R     Comments: while on enoxaparin  therapy    11/15/23 1051           Antimicrobials/Microbiology: Anti-infectives (From admission, onward)    None         Component Value Date/Time   SDES URINE, RANDOM 06/17/2020 2010   SPECREQUEST  06/17/2020 2010    NONE Performed at [facility], [address] [place], [place] [address]    CULT >=100,000 COLONIES/mL ESCHERICHIA COLI (A) 06/17/2020 2010   REPTSTATUS 06/20/2020 FINAL 06/17/2020 2010    Procedures:  Medications reviewed: Scheduled Meds:  amLODipine   10 mg Oral Daily   atorvastatin   40 mg Oral Daily   carvedilol   25  mg Oral BID WC   enoxaparin  (LOVENOX ) injection  40 mg Subcutaneous Q24H   folic acid   1 mg Oral Daily   multivitamin with minerals  1 tablet Oral Daily   pantoprazole  (PROTONIX ) IV  40 mg Intravenous Q12H   polyethylene glycol  17 g Oral Daily   sodium chloride  flush  3 mL Intravenous Q12H   thiamine   100 mg Oral Daily   Or   thiamine   100 mg Intravenous Daily   Continuous Infusions:    Mennie LAMY, MD [facility] 11/19/2023, 10:00 AM

## 2023-11-20 ENCOUNTER — Other Ambulatory Visit (HOSPITAL_COMMUNITY): Payer: Self-pay

## 2023-11-20 DIAGNOSIS — K852 Alcohol induced acute pancreatitis without necrosis or infection: Secondary | ICD-10-CM | POA: Diagnosis not present

## 2023-11-20 MED ORDER — THIAMINE HCL 100 MG PO TABS
100.0000 mg | ORAL_TABLET | Freq: Every day | ORAL | 0 refills | Status: AC
  Filled 2023-11-20: qty 30, 30d supply, fill #0

## 2023-11-20 MED ORDER — FOLIC ACID 1 MG PO TABS
1.0000 mg | ORAL_TABLET | Freq: Every day | ORAL | 0 refills | Status: AC
  Filled 2023-11-20: qty 30, 30d supply, fill #0

## 2023-11-20 MED ORDER — OMEPRAZOLE 40 MG PO CPDR
40.0000 mg | DELAYED_RELEASE_CAPSULE | Freq: Every day | ORAL | 0 refills | Status: DC
  Filled 2023-11-20: qty 30, 30d supply, fill #0

## 2023-11-20 MED ORDER — TRAMADOL HCL 50 MG PO TABS
50.0000 mg | ORAL_TABLET | Freq: Four times a day (QID) | ORAL | 0 refills | Status: AC | PRN
  Filled 2023-11-20: qty 12, 3d supply, fill #0

## 2023-11-20 MED ORDER — POLYETHYLENE GLYCOL 3350 17 GM/SCOOP PO POWD
17.0000 g | Freq: Every day | ORAL | 0 refills | Status: DC
  Filled 2023-11-20: qty 238, 14d supply, fill #0

## 2023-11-20 NOTE — TOC Transition Note (Addendum)
 Transition of Care [facility]) - Discharge Note   Patient Details  Name: [name] MRN: [id number] Date of Birth: 1960-11-20  [facility] CM/SW Contact:  Rosalva Jon Bloch, RN Phone Number: 11/20/2023, 10:08 AM   Clinical Narrative:    Patient will DC to: home Anticipated DC date: 11/20/2023 Family notified: yes Transport by:car    Acute alcohol related pancreatitis  Per MD patient ready for DC today. RN, patient, and patient's family aware of DC plan. Pt states will f/u with [facility] for ETOH rehab concerns. Resources noted on AVS. Post hospital follow noted on AVS. Pt without transportation issues or RX med concerns. Family to provide transportation to home once d/c ready.  RNCM will sign off for now as intervention is no longer needed. Please consult us  again if new needs arise.    Final next level of care: Home/Self Care     Patient Goals and CMS Choice            Discharge Placement                       Discharge Plan and Services Additional resources added to the After Visit Summary for                                       Social Drivers of Health (SDOH) Interventions SDOH Screenings   Food Insecurity: No Food Insecurity (11/12/2023)  Housing: Low Risk  (11/12/2023)  Transportation Needs: No Transportation Needs (11/12/2023)  Utilities: Not At Risk (11/12/2023)  Depression (PHQ2-9): Low Risk  (10/29/2023)  Social Connections: Socially Isolated (11/12/2023)  Tobacco Use: Medium Risk (11/12/2023)     Readmission Risk Interventions     No data to display

## 2023-11-20 NOTE — Progress Notes (Signed)
 Discharge packet reviewed at bedside, iv access removed, toc medication will be picked up on the way to the discharge lounge, patient transferred to discharge lounge, while waiting for his family member to pick him up.

## 2023-11-20 NOTE — Discharge Summary (Signed)
 Physician Discharge Summary  [name]:[id number] DOB: Apr 14, 1961 DOA: 11/11/2023  PCP: [name], NP  Admit date: 11/11/2023 Discharge date: 11/20/2023 Recommendations for Outpatient Follow-up:  Follow up with PCP in 1 weeks-call for appointment Please obtain BMP/CBC in one week Continue to quit alcohol  Discharge Dispo: home Discharge Condition: Stable Code Status:   Code Status: Full Code Diet recommendation:  Diet Order             Diet Heart Room service appropriate? Yes; Fluid consistency: Thin  Diet effective now                    Brief/Interim Summary:  63 y.o. male with hx of alcohol use disorder with history of recurrent alcohol related pancreatitis, hypertension, hyperlipidemia, who presents with worsening upper abdominal pain.  Reports onset of symptoms yesterday morning after of night of heavy drinking the night before.  He is drinking about a pint of liquor or sixpack of beer per day.  Yesterday morning developed sudden onset of a band of upper abdominal pain which has been persistent since onset, not relieved with Tylenol  at home.  Pain remains severe despite dilaudid  in the ED. He has had nausea and vomiting with dark, questionable coffee-ground emesis.  Has also had recent dark stools.  Reports previously going to rehab for alcohol use but relapsed, he is interested in quitting drinking again and interested in medication assisted treatment for alcohol use disorder.  GI is following and is being managed conservatively with acute pancreatitis, complicated by ileus. Diet slowly advanced so far tolerating electrolytes has improved. Diet has been advanced to low-fat diet 7/15> tolerating and stable for discharge home.   Subjective: Seen and examined Overnight afebrile BP stable  Aaox3. 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  Seen by GI, diet advance slowly, at this time is clinically improved having bowel meant tolerating diet Advised to go slowly on diet as tolerated  Reported black stools/coffee-ground emesis  ?Duodenitis, or gastritis, alcohol related: No evidence of bleeding, continue PPI. Hb stable  Severe alcohol use disorder, at risk of withdrawal: Drinking 1 pint of liquor, or sixpack per day. Previous success with rehabilitation program outpatient but relapsed. Appears motivated to quit TOC consulted given thiamine  and folate, at this time no signs of withdrawal  Hypokalemia Hypomagnesemia: Normalized   Hypertension: BP was uncontrolled. cont amlodipine  Coreg  7/14 and losartan   Hyperlipidemia: continue home atorvastatin   Incidental findings: Aortic, coronary atherosclerosis Bosniak 2 renal cyst Microscopic hematuria Follow-up with PCP   Discharge Exam: Vitals:   11/20/23 0553 11/20/23 0808  BP: (!) 146/75 (!) 164/89  Pulse: 72 75  Resp: 16 18  Temp: 98.4 F (36.9 C) 98.2 F (36.8 C)  SpO2: 97% 94%   General: Pt is alert, awake, not in acute distress Cardiovascular: RRR, S1/S2 +, no rubs, no gallops Respiratory: CTA bilaterally, no wheezing, no rhonchi Abdominal: Soft, NT, ND, bowel sounds + Extremities: no edema, no cyanosis  Discharge Instructions  Discharge Instructions     Discharge instructions   Complete by: As directed    Please do not start drinking alcohol continue to quit, follow-up with PCP in 1 week  Please call call MD or return to ER  for similar or worsening recurring problem that brought you to hospital or if any fever,nausea/vomiting,abdominal pain, uncontrolled pain, chest pain,  shortness of breath or any other alarming  symptoms.  Please follow-up your doctor as instructed in a week time and call the office for appointment.  Please avoid alcohol, smoking, or any other illicit substance and maintain healthy habits including taking your regular medications as prescribed.  You were cared for by a hospitalist during your hospital stay. If you have any questions about your discharge medications or the care you received while you were in the hospital after you are discharged, you can call the unit and ask to speak with the hospitalist on call if the hospitalist that took care of you is not available.  Once you are discharged, your primary care physician will handle any further medical issues. Please note that NO REFILLS for any discharge medications will be authorized once you are discharged, as it is imperative that you return to your primary care physician (or establish a relationship with a primary care physician if you do not have one) for your aftercare needs so that they can reassess your need for medications and monitor your lab values   Increase activity slowly   Complete by: As directed       Allergies as of 11/20/2023       Reactions   Hydrocodone -acetaminophen  Palpitations   Shrimp [shellfish Allergy] Other (See Comments)   Cholesterol level becomes very high. Patient reports sweating and itching.        Medication List     STOP taking these medications    chlordiazePOXIDE  25 MG capsule Commonly known as: LIBRIUM    oxyCODONE -acetaminophen  5-325 MG tablet Commonly known as: PERCOCET/ROXICET       TAKE these medications    amLODipine  10 MG tablet Commonly known as: NORVASC  Take 1 tablet (10 mg total) by mouth daily.   atorvastatin  40 MG tablet Commonly known as: LIPITOR Take 1 tablet (40 mg total) by mouth daily.   carvedilol  25 MG tablet Commonly known as: COREG  Take 1 tablet (25 mg total) by mouth 2 (two) times daily with a meal.   folic acid  1 MG tablet Commonly known as:  FOLVITE  Take 1 tablet (1 mg total) by mouth daily. Start taking on: November 21, 2023   losartan  25 MG tablet Commonly known as: COZAAR  Take 1 tablet (25 mg total) by mouth daily.   multivitamin with minerals tablet Take 1 tablet by mouth daily.   Nurtec 75 MG Tbdp Generic drug: Rimegepant Sulfate Take 1 tablet (75 mg total) by mouth daily as needed (migraine headache).   omeprazole  40 MG capsule Commonly known as: PRILOSEC Take 1 capsule (40 mg total) by mouth daily.   polyethylene glycol 17 g packet Commonly known as: MIRALAX  / GLYCOLAX  Take 17 g by mouth daily. Start taking on: November 21, 2023   rizatriptan  10 MG disintegrating tablet Commonly known as: MAXALT -MLT Take 1 tablet (10 mg total) by mouth as needed for migraine. May repeat in 2 hours if needed   thiamine  100 MG tablet Commonly known as: Vitamin B-1 Take 1 tablet (100 mg total) by mouth daily. Start taking on: November 21, 2023   traMADol  50 MG tablet Commonly known as: Ultram  Take 1 tablet (50 mg total) by mouth every 6 (six) hours as needed for up to 12 doses.   triamcinolone  cream 0.1 % Commonly known as: KENALOG  Apply 1 application topically 2 (two) times daily.   VISINE OP  Place 1 drop into both eyes daily as needed (redness).        Follow-up Information     [facility], [facility] Follow up.   Contact information: [address] [place] [address] [phone]         [facility] Follow up on 12/02/2023.   Specialty: Family Medicine Why: Please follow up with PCP on 12/02/2023 at 2:10pm Contact information: [address] [place] [facility] [place]  [address] [phone] Additional information: [address]  [place], [place] [address]               Allergies  Allergen Reactions   Hydrocodone -Acetaminophen  Palpitations   Shrimp [Shellfish Allergy] Other (See Comments)    Cholesterol level becomes very high. Patient reports sweating and  itching.    The results of significant diagnostics from this hospitalization (including imaging, microbiology, ancillary and laboratory) are listed below for reference.    Microbiology: No results found for this or any previous visit (from the past 240 hours).  Procedures/Studies: DG Abd 1 View Result Date: 11/16/2023 CLINICAL DATA:  [id number] Ileus [facility]) [id number] EXAM: ABDOMEN - 1 VIEW COMPARISON:  November 15, 2023 FINDINGS: Marked gaseous dilation of a loop of favored colon in the RIGHT hemiabdomen to proximally 13.5 cm. Incomplete visualization of the abdomen. Extent of this dilation appears increased since prior. There is diffuse gaseous distension of other visualized loops of large and small bowel. Status post posterior fixation of the lower lumbar spine. Pelvic phleboliths. No definitive air is visualized in the rectum. IMPRESSION: Marked gaseous dilation of a loop bowel in the RIGHT hemiabdomen, favored to reflect a markedly dilated cecum. Extent of this dilation appears increased since prior. In the setting of adjacent pancreatitis, this likely reflects colonic ileus. However if concern for interval obstruction, this would be better assessed with dedicated CT abdomen pelvis with contrast. Electronically Signed   By: Corean Salter M.D.   On: 11/16/2023 17:44   DG Abd Portable 1V Result Date: 11/15/2023 CLINICAL DATA:  Bloating and abdominal pain EXAM: PORTABLE ABDOMEN - 1 VIEW COMPARISON:  CT abdomen and pelvis dated 11/12/2023, abdominal radiograph dated 10/10/2016 FINDINGS: Increased diffuse gas-filled dilation of loops of bowel throughout the abdomen. Lucency along the right hepatic margin of the liver, likely interface with peritoneal fat. No definite free air or pneumatosis. No abnormal radio-opaque calculi or mass effect. No acute or substantial osseous abnormality. The sacrum and coccyx are partially obscured by overlying bowel contents. Partially imaged lumbar spinal fixation hardware appears  intact. IMPRESSION: Diffuse gas-filled dilated loops of bowel throughout the abdomen, which may represent ileus in the setting of acute pancreatitis. Electronically Signed   By: Limin  Xu M.D.   On: 11/15/2023 10:24   CT ABDOMEN PELVIS W CONTRAST Result Date: 11/12/2023 CLINICAL DATA:  Severe pancreatitis symptoms with previous history of pancreatitis. EXAM: CT ABDOMEN AND PELVIS WITH CONTRAST TECHNIQUE: Multidetector CT imaging of the abdomen and pelvis was performed using the standard protocol following bolus administration of intravenous contrast. RADIATION DOSE REDUCTION: This exam was performed according to the departmental dose-optimization program which includes automated exposure control, adjustment of the mA and/or kV according to patient size and/or use of iterative reconstruction technique. CONTRAST:  OMNIPAQUE  IOHEXOL  350 MG/ML SOLN COMPARISON:  CT without contrast 01/12/2021, CT with contrast 05/19/2018. FINDINGS: Lower chest: Lung bases are clear. The cardiac size is normal. Small hiatal hernia. There are 2-vessel coronary calcifications in the LAD and right coronary arteries. Hepatobiliary: The  liver is 22 cm in length with mild-to-moderate steatosis. There is no mass enhancement. No cirrhotic surface changes to the liver contour. Gallbladder and bile ducts are unremarkable. Pancreas: There is generalized pancreatic edema and swelling consistent with acute interstitial pancreatitis. There is moderate peripancreatic inflammatory reaction with nonlocalized fluid extending into the hypogastrium, perigastrium and lesser sac, and surrounding the pancreas and layering over the bilateral Gerota's fascia with small amount collecting in the pelvis. The fluid is low in density not suspicious for hemorrhage. There is no free air or abscess. There is no pancreatic mass enhancement or ductal dilatation. Spleen: No abnormality. Adrenals/Urinary Tract: There is a 2 cm Bosniak 1 cyst of the medial upper pole  right kidney, Hounsfield density is 17. There are additional scattered occasional bilateral tiny Bosniak 2 cortical cysts which are too small to characterize. No follow-up imaging is recommended. JACR 2018 Feb; 264-273, Management of the Incidental Renal Mass on CT, [employer] 2021; 814-848, Bosniak Classification of Cystic Renal Masses, Version 2019. There is no adrenal or renal mass enhancement. No urinary stone or obstruction is seen. There is symmetric excretion on the delayed images. The bladder is unremarkable for the degree of distension. Stomach/Bowel: There are thickened folds in the duodenum, probably reactive due to the pancreatitis or could be coexisting duodenitis. Rest of the small bowel and gastric wall are unremarkable. Normal caliber appendix. The cecum is mobile located in the anterior right mid abdomen. There is diffuse diverticulosis without evidence of acute diverticulitis. Vascular/Lymphatic: Aortic atherosclerosis. No enlarged abdominal or pelvic lymph nodes. Multiple pelvic phleboliths. Reproductive: Prostate is unremarkable. Other: There are small umbilical and inguinal fat hernias. No incarcerated hernia. No free hemorrhage, free air or abscess. Musculoskeletal: L4-5 bilateral dorsal fusion rods and pedicle screws are again noted with solid interbody fusion. Mild degenerative change of the spine is also seen. IMPRESSION: 1. Acute interstitial pancreatitis with moderate peripancreatic inflammatory reaction and nonlocalized patchy fluid extending into the hypogastrium, perigastrium and lesser sac, and layering over the bilateral Gerota's fascia with small amount collecting in the pelvis. No free air or abscess. 2. Thickened folds in the duodenum probably reactive due to the pancreatitis or could be coexisting duodenitis. 3. Diverticulosis without evidence of diverticulitis. 4. Aortic and coronary artery atherosclerosis. 5. Small hiatal hernia. 6. Umbilical and inguinal fat hernias. 7.  Bosniak 1 and 2 renal cysts. Aortic Atherosclerosis (ICD10-I70.0). Electronically Signed   By: Francis Quam M.D.   On: 11/12/2023 02:05   DG Chest Port 1 View Result Date: 11/12/2023 CLINICAL DATA:  Chest pain and abdominal pain. EXAM: PORTABLE CHEST 1 VIEW COMPARISON:  01/12/2021 FINDINGS: Shallow inspiration. Heart size and pulmonary vascularity are normal for technique. Lungs are clear. No pleural effusion or pneumothorax. Mediastinal contours appear intact. Calcification of the aorta. IMPRESSION: No active disease. Electronically Signed   By: Elsie Gravely M.D.   On: 11/12/2023 01:11    Labs: BNP (last 3 results) No results for input(s): BNP in the last 8760 hours. Basic Metabolic Panel: Recent Labs  Lab 11/14/23 0729 11/15/23 0645 11/16/23 0703 11/17/23 0818 11/17/23 1100 11/18/23 0705 11/18/23 0717 11/18/23 2034  NA 142 138 139 139  --  139  --  141  K 3.5 3.7 3.2* 2.7*  --  2.7*  --  3.7  CL 110 105 104 104  --  104  --  105  CO2 20* 24 24 22   --  24  --  25  GLUCOSE 139* 143* 113* 96  --  126*  --  125*  BUN 10 7* 5* <5*  --  <5*  --  <5*  CREATININE 1.01 0.88 0.95 0.96  --  0.87  --  0.87  CALCIUM  7.9* 8.1* 8.4* 8.4*  --  8.6*  --  8.7*  MG 1.9  --   --   --  1.5*  --  1.7  --   PHOS  --   --   --   --   --   --  2.5  --    Liver Function Tests: Recent Labs  Lab 11/14/23 0729 11/15/23 0645 11/16/23 0703 11/17/23 0818 11/18/23 0705  AST 20 22 19 19 26   ALT 17 14 15 14 19   ALKPHOS 45 45 46 50 58  BILITOT 1.4* 1.2 1.3* 1.5* 1.3*  PROT 6.4* 6.1* 6.2* 6.1* 6.5  ALBUMIN  3.1* 2.8* 2.9* 2.8* 3.1*   No results for input(s): LIPASE, AMYLASE in the last 168 hours. No results for input(s): AMMONIA in the last 168 hours. CBC: Recent Labs  Lab 11/14/23 0729 11/15/23 0645 11/16/23 0703 11/17/23 0818 11/18/23 0705  WBC 16.3* 14.6* 11.1* 8.9 9.2  HGB 11.8* 11.7* 11.2* 10.6* 10.7*  HCT 36.0* 35.5* 33.2* 31.9* 31.8*  MCV 95.0 95.7 95.4 94.1 91.9  PLT 128*  159 184 189 217  Urinalysis    Component Value Date/Time   COLORURINE YELLOW 11/12/2023 0242   APPEARANCEUR HAZY (A) 11/12/2023 0242   LABSPEC >1.046 (H) 11/12/2023 0242   PHURINE 5.0 11/12/2023 0242   GLUCOSEU NEGATIVE 11/12/2023 0242   GLUCOSEU NEGATIVE 06/08/2010 1329   HGBUR SMALL (A) 11/12/2023 0242   BILIRUBINUR NEGATIVE 11/12/2023 0242   BILIRUBINUR small (A) 06/17/2020 1956   KETONESUR NEGATIVE 11/12/2023 0242   PROTEINUR 100 (A) 11/12/2023 0242   UROBILINOGEN 1.0 08/23/2021 1311   NITRITE NEGATIVE 11/12/2023 0242   LEUKOCYTESUR NEGATIVE 11/12/2023 0242   Sepsis Labs Recent Labs  Lab 11/15/23 0645 11/16/23 0703 11/17/23 0818 11/18/23 0705  WBC 14.6* 11.1* 8.9 9.2   Microbiology No results found for this or any previous visit (from the past 240 hours).  Time coordinating discharge: 35 minutes  SIGNED: Mennie LAMY, MD  [facility] 11/20/2023, 11:02 AM  If 7PM-7AM, please contact night-coverage [email or web address]

## 2023-11-21 ENCOUNTER — Telehealth (INDEPENDENT_AMBULATORY_CARE_PROVIDER_SITE_OTHER): Payer: Self-pay

## 2023-11-21 NOTE — Transitions of Care (Post Inpatient/ED Visit) (Signed)
   11/21/2023  Name: [name] MRN: [id number] DOB: Aug 07, 1960  Today's TOC FU Call Status: Today's TOC FU Call Status:: Unsuccessful Call (1st Attempt) Unsuccessful Call (1st Attempt) Date: 11/21/23  Attempted to reach the patient regarding the most recent Inpatient/ED visit.  Follow Up Plan: Additional outreach attempts will be made to reach the patient to complete the Transitions of Care (Post Inpatient/ED visit) call.   Signature Julian Lemmings, LPN [facility] Nurse Health Advisor Direct Dial [phone]

## 2023-11-22 NOTE — Telephone Encounter (Signed)
 Copied from CRM [id number]. Topic: General - Other >> Nov 22, 2023 12:46 PM Delon DASEN wrote: Reason for CRM: returning call from office- no message left

## 2023-11-22 NOTE — Transitions of Care (Post Inpatient/ED Visit) (Signed)
   11/22/2023  Name: [name] MRN: [id number] DOB: 1960-09-12  Today's TOC FU Call Status: Today's TOC FU Call Status:: Unsuccessful Call (2nd Attempt) Unsuccessful Call (1st Attempt) Date: 11/21/23 Unsuccessful Call (2nd Attempt) Date: 11/22/23  Attempted to reach the patient regarding the most recent Inpatient/ED visit.  Follow Up Plan: Additional outreach attempts will be made to reach the patient to complete the Transitions of Care (Post Inpatient/ED visit) call.   Signature Julian Lemmings, LPN [facility] Nurse Health Advisor Direct Dial [phone]

## 2023-11-25 NOTE — Transitions of Care (Post Inpatient/ED Visit) (Signed)
 11/25/2023  Name: [name] MRN: [id number] DOB: 1961/04/20  Today's TOC FU Call Status: Today's TOC FU Call Status:: Successful TOC FU Call Completed Unsuccessful Call (1st Attempt) Date: 11/21/23 Unsuccessful Call (2nd Attempt) Date: 11/22/23 [facility] FU Call Complete Date: 11/25/23 Patient's Name and Date of Birth confirmed.  Transition Care Management Follow-up Telephone Call Date of Discharge: 11/20/23 Discharge Facility: [facility]) Type of Discharge: Inpatient Admission Primary Inpatient Discharge Diagnosis:: pancreatitis How have you been since you were released from the hospital?: Better Any questions or concerns?: No  Items Reviewed: Did you receive and understand the discharge instructions provided?: Yes Medications obtained,verified, and reconciled?: Yes (Medications Reviewed) Any new allergies since your discharge?: No Dietary orders reviewed?: Yes Do you have support at home?: Yes People in Home [RPT]: parent(s)  Medications Reviewed Today: Medications Reviewed Today     Reviewed by [name], LPN (Licensed Practical Nurse) on 11/25/23 at 1227  Med List Status: <None>   Medication Order Taking? Sig Documenting Provider Last Dose Status Informant  amLODipine  (NORVASC ) 10 MG tablet [id number] Yes Take 1 tablet (10 mg total) by mouth daily. [name], MD  Active Self, Pharmacy Records  atorvastatin  (LIPITOR) 40 MG tablet [id number] Yes Take 1 tablet (40 mg total) by mouth daily. [name], MD  Active Self, Pharmacy Records  carvedilol  (COREG ) 25 MG tablet [id number] Yes Take 1 tablet (25 mg total) by mouth 2 (two) times daily with a meal. [name], MD  Active Self, Pharmacy Records    Discontinued 06/17/20 2019   folic acid  (FOLVITE ) 1 MG tablet [id number] Yes Take 1 tablet (1 mg total) by mouth daily. Christobal Guadalajara, MD  Active   losartan  (COZAAR ) 25 MG tablet [id number] Yes Take 1 tablet (25 mg total) by mouth daily. [name], MD   Active Self, Pharmacy Records    Discontinued 06/17/20 2019   Multiple Vitamins-Minerals (MULTIVITAMIN WITH MINERALS) tablet [id number] Yes Take 1 tablet by mouth daily. [provider]  Active Self, Pharmacy Records  omeprazole  (PRILOSEC) 40 MG capsule [id number] Yes Take 1 capsule (40 mg total) by mouth daily. Christobal Guadalajara, MD  Active   polyethylene glycol powder (GLYCOLAX /MIRALAX ) 17 GM/SCOOP powder [id number] Yes Take 17 g by mouth daily. Christobal Guadalajara, MD  Active   Rimegepant Sulfate (NURTEC) 75 MG TBDP [id number]  Take 1 tablet (75 mg total) by mouth daily as needed (migraine headache).  Patient not taking: Reported on 11/25/2023   Long, Joshua G, MD  Active Self, Pharmacy Records  rizatriptan  (MAXALT -MLT) 10 MG disintegrating tablet [id number]  Take 1 tablet (10 mg total) by mouth as needed for migraine. May repeat in 2 hours if needed  Patient not taking: Reported on 11/25/2023   Margaret Eduard SAUNDERS, MD  Active Self, Pharmacy Records  Tetrahydrozoline HCl [facility] OP) [id number] Yes Place 1 drop into both eyes daily as needed (redness). [provider]  Active Self, Pharmacy Records  thiamine  (VITAMIN B1) 100 MG tablet [id number] Yes Take 1 tablet (100 mg total) by mouth daily. Christobal Guadalajara, MD  Active   traMADol  (ULTRAM ) 50 MG tablet [id number] Yes Take 1 tablet (50 mg total) by mouth every 6 (six) hours as needed for up to 12 doses. Christobal Guadalajara, MD  Active   triamcinolone  cream (KENALOG ) 0.1 % [id number]  Apply 1 application topically 2 (two) times daily.  Patient not taking: Reported on 11/25/2023   Arloa Suzen RAMAN, NP  Active Self, Pharmacy Records  Home Care and Equipment/Supplies: Were Home Health Services Ordered?: NA Any new equipment or medical supplies ordered?: NA  Functional Questionnaire: Do you need assistance with bathing/showering or dressing?: No Do you need assistance with meal preparation?: No Do you have difficulty maintaining continence: No Do you  need assistance with getting out of bed/getting out of a chair/moving?: No Do you have difficulty managing or taking your medications?: No  Follow up appointments reviewed: PCP Follow-up appointment confirmed?: Yes Date of PCP follow-up appointment?: 12/02/23 Follow-up Provider: [facility] Follow-up appointment confirmed?: Yes Date of Specialist follow-up appointment?: 12/17/23 Follow-Up Specialty Provider:: GI Do you need transportation to your follow-up appointment?: No Do you understand care options if your condition(s) worsen?: Yes-patient verbalized understanding    SIGNATURE Julian Lemmings, LPN [facility] Nurse Health Advisor Direct Dial [phone]

## 2023-12-02 ENCOUNTER — Encounter (INDEPENDENT_AMBULATORY_CARE_PROVIDER_SITE_OTHER): Payer: Self-pay | Admitting: Primary Care

## 2023-12-02 ENCOUNTER — Other Ambulatory Visit (INDEPENDENT_AMBULATORY_CARE_PROVIDER_SITE_OTHER): Payer: Self-pay | Admitting: Primary Care

## 2023-12-02 ENCOUNTER — Other Ambulatory Visit: Payer: Self-pay

## 2023-12-02 ENCOUNTER — Ambulatory Visit (INDEPENDENT_AMBULATORY_CARE_PROVIDER_SITE_OTHER): Payer: MEDICAID | Admitting: Primary Care

## 2023-12-02 VITALS — BP 100/68 | HR 83 | Resp 16 | Wt 252.0 lb

## 2023-12-02 DIAGNOSIS — E6609 Other obesity due to excess calories: Secondary | ICD-10-CM

## 2023-12-02 DIAGNOSIS — I10 Essential (primary) hypertension: Secondary | ICD-10-CM | POA: Diagnosis not present

## 2023-12-02 DIAGNOSIS — E66811 Obesity, class 1: Secondary | ICD-10-CM

## 2023-12-02 DIAGNOSIS — Z79899 Other long term (current) drug therapy: Secondary | ICD-10-CM | POA: Diagnosis not present

## 2023-12-02 DIAGNOSIS — Z09 Encounter for follow-up examination after completed treatment for conditions other than malignant neoplasm: Secondary | ICD-10-CM

## 2023-12-02 DIAGNOSIS — Z6834 Body mass index (BMI) 34.0-34.9, adult: Secondary | ICD-10-CM

## 2023-12-02 NOTE — Progress Notes (Signed)
 Subjective:  [name] is a 63 y.o. male presents for hospital follow up.  Admit date to the hospital was 11/11/23, patient was discharged from the hospital on 11/20/23, patient was admitted for: Melena ,Alcoholic pancreatitis, Upper abdominal pain and Ileus.  Patient this hospitalization almost took him out.  All he can say is thank you Lord.  He also has not had a drink of any kind of alcohol since time of discharge.  Patient never discussed this with me before that he has a baby child and caretaker of his 75 year old mom.  Sometimes things are overwhelming and a little drink here and there seem to calm things down.  Now he is upset because his brothers and sisters told him he was not obligated to take care of his mother.  However none of them offered to help him or even pay for someone to come in to give him some type of rest. Allergies  Allergen Reactions   Hydrocodone -Acetaminophen  Palpitations   Shrimp [Shellfish Allergy] Other (See Comments)    Cholesterol level becomes very high. Patient reports sweating and itching.    Current Outpatient Medications on File Prior to Visit  Medication Sig Dispense Refill   amLODipine  (NORVASC ) 10 MG tablet Take 1 tablet (10 mg total) by mouth daily. 90 tablet 3   atorvastatin  (LIPITOR) 40 MG tablet Take 1 tablet (40 mg total) by mouth daily. 90 tablet 3   carvedilol  (COREG ) 25 MG tablet Take 1 tablet (25 mg total) by mouth 2 (two) times daily with a meal. 180 tablet 3   folic acid  (FOLVITE ) 1 MG tablet Take 1 tablet (1 mg total) by mouth daily. 30 tablet 0   losartan  (COZAAR ) 25 MG tablet Take 1 tablet (25 mg total) by mouth daily. 90 tablet 3   Multiple Vitamins-Minerals (MULTIVITAMIN WITH MINERALS) tablet Take 1 tablet by mouth daily.     omeprazole  (PRILOSEC) 40 MG capsule Take 1 capsule (40 mg total) by mouth daily. 30 capsule 0   polyethylene glycol powder (GLYCOLAX /MIRALAX ) 17 GM/SCOOP powder Take 17 g by mouth daily. 238 g 0    Tetrahydrozoline HCl (VISINE OP) Place 1 drop into both eyes daily as needed (redness).     thiamine  (VITAMIN B1) 100 MG tablet Take 1 tablet (100 mg total) by mouth daily. 30 tablet 0   traMADol  (ULTRAM ) 50 MG tablet Take 1 tablet (50 mg total) by mouth every 6 (six) hours as needed for up to 12 doses. 12 tablet 0   [DISCONTINUED] diphenhydrAMINE  (BENADRYL ) 25 MG tablet Take 1 tablet (25 mg total) by mouth every 6 (six) hours as needed. 30 tablet 0   No current facility-administered medications on file prior to visit.    Review of System: ROS Comprehensive ROS Pertinent positive and negative noted in HPI   Objective:  BP 100/68   Pulse 83   Resp 16   Wt 252 lb (114.3 kg)   SpO2 98%   BMI 34.18 kg/m   Filed Weights   12/02/23 1433  Weight: 252 lb (114.3 kg)    Physical Exam Vitals reviewed.  Constitutional:      Appearance: He is obese.  HENT:     Head: Normocephalic.     Right Ear: Tympanic membrane and external ear normal.     Left Ear: There is impacted cerumen.     Nose: Nose normal.  Eyes:     Extraocular Movements: Extraocular movements intact.     Pupils: Pupils are equal, round,  and reactive to light.  Cardiovascular:     Rate and Rhythm: Normal rate and regular rhythm.  Pulmonary:     Effort: Pulmonary effort is normal.     Breath sounds: Normal breath sounds.  Abdominal:     General: Bowel sounds are normal. There is distension.     Palpations: Abdomen is soft.  Musculoskeletal:        General: Normal range of motion.     Cervical back: Normal range of motion and neck supple.  Skin:    General: Skin is warm and dry.  Neurological:     Mental Status: He is oriented to person, place, and time.  Psychiatric:        Mood and Affect: Mood normal.        Behavior: Behavior normal.        Thought Content: Thought content normal.        Judgment: Judgment normal.      Assessment:  [name] was seen today for hospitalization follow-up and medication  management.  Diagnoses and all orders for this visit:  Hospital discharge follow-up See HPI  Essential hypertension Bp well control followed by cardiology [name], MD  -     CBC with Differential/Platelet  Class 1 obesity due to excess calories with serious comorbidity and body mass index (BMI) of 34.0 to 34.9 in adult Obesity is 30-39 indicating an excess in caloric intake or underlining conditions. This may lead to other co-morbidities. Educated on lifestyle modifications of diet and exercise which may reduce obesity.     This note has been created with Education officer, environmental. Any transcriptional errors are unintentional.   Return in about 3 months (around 03/03/2024) for ck up .  Rosaline SHAUNNA Bohr, NP 12/02/2023, 2:56 PM

## 2023-12-03 LAB — CBC WITH DIFFERENTIAL/PLATELET
Basophils Absolute: 0.1 x10E3/uL (ref 0.0–0.2)
Basos: 1 %
EOS (ABSOLUTE): 0.2 x10E3/uL (ref 0.0–0.4)
Eos: 2 %
Hematocrit: 35.5 % — ABNORMAL LOW (ref 37.5–51.0)
Hemoglobin: 11.4 g/dL — ABNORMAL LOW (ref 13.0–17.7)
Immature Grans (Abs): 0.1 x10E3/uL (ref 0.0–0.1)
Immature Granulocytes: 1 %
Lymphocytes Absolute: 1.9 x10E3/uL (ref 0.7–3.1)
Lymphs: 20 %
MCH: 29.8 pg (ref 26.6–33.0)
MCHC: 32.1 g/dL (ref 31.5–35.7)
MCV: 93 fL (ref 79–97)
Monocytes Absolute: 0.8 x10E3/uL (ref 0.1–0.9)
Monocytes: 8 %
Neutrophils Absolute: 6.9 x10E3/uL (ref 1.4–7.0)
Neutrophils: 68 %
Platelets: 473 x10E3/uL — ABNORMAL HIGH (ref 150–450)
RBC: 3.82 x10E6/uL — ABNORMAL LOW (ref 4.14–5.80)
RDW: 12.5 % (ref 11.6–15.4)
WBC: 9.8 x10E3/uL (ref 3.4–10.8)

## 2023-12-03 LAB — CMP14+EGFR
ALT: 17 IU/L (ref 0–44)
AST: 19 IU/L (ref 0–40)
Albumin: 4.4 g/dL (ref 3.9–4.9)
Alkaline Phosphatase: 53 IU/L (ref 44–121)
BUN/Creatinine Ratio: 9 — ABNORMAL LOW (ref 10–24)
BUN: 11 mg/dL (ref 8–27)
Bilirubin Total: 0.5 mg/dL (ref 0.0–1.2)
CO2: 21 mmol/L (ref 20–29)
Calcium: 9.8 mg/dL (ref 8.6–10.2)
Chloride: 102 mmol/L (ref 96–106)
Creatinine, Ser: 1.28 mg/dL — ABNORMAL HIGH (ref 0.76–1.27)
Globulin, Total: 2.6 g/dL (ref 1.5–4.5)
Glucose: 128 mg/dL — ABNORMAL HIGH (ref 70–99)
Potassium: 4.1 mmol/L (ref 3.5–5.2)
Sodium: 142 mmol/L (ref 134–144)
Total Protein: 7 g/dL (ref 6.0–8.5)
eGFR: 64 mL/min/1.73 (ref >59)

## 2023-12-05 ENCOUNTER — Ambulatory Visit: Payer: Self-pay | Admitting: Primary Care

## 2023-12-09 ENCOUNTER — Telehealth: Payer: Self-pay | Admitting: Cardiovascular Disease

## 2023-12-09 NOTE — Telephone Encounter (Signed)
 Pt c/o medication issue:  1. Name of Medication: aspirin  81 MG EC tablet  and fiber pill  2. How are you currently taking this medication (dosage and times per day)?   3. Are you having a reaction (difficulty breathing--STAT)?   4. What is your medication issue?   Patient stated he was taken of these medications and put on new medication.  Patient wants to know if he should restart taking these medications in addition to his new medications.

## 2023-12-09 NOTE — Telephone Encounter (Signed)
 Patient calling for medication education after discharged from hospital on 7/16. Patient calling to inquire about restarting OTC aspirin  81 mg and OTC fiber. Made patient aware that provider and pharmacy would be notified for recommendations and advice on restarting aspirin  and OTC fiber. Understanding verbalized.

## 2023-12-09 NOTE — Telephone Encounter (Signed)
 Called and made patient aware that Dr. Court recommended that he hold off on Aspirin  81 mg now due to recent hospital admission and GI bleed. Patient verbalized an understanding.

## 2023-12-11 ENCOUNTER — Other Ambulatory Visit (INDEPENDENT_AMBULATORY_CARE_PROVIDER_SITE_OTHER): Payer: Self-pay | Admitting: Primary Care

## 2023-12-11 ENCOUNTER — Other Ambulatory Visit (HOSPITAL_COMMUNITY): Payer: Self-pay

## 2023-12-11 NOTE — Telephone Encounter (Signed)
 Copied from CRM [id number]. Topic: Clinical - Medication Refill >> Dec 11, 2023  3:35 PM Pinkey ORN wrote: Medication: traMADol  (ULTRAM ) 50 MG tablet, polyethylene glycol powder (GLYCOLAX /MIRALAX ) 17 GM/SCOOP powder  Has the patient contacted their pharmacy? Yes (Agent: If no, request that the patient contact the pharmacy for the refill. If patient does not wish to contact the pharmacy document the reason why and proceed with request.) (Agent: If yes, when and what did the pharmacy advise?)  This is the patient's preferred pharmacy:    [facility] [address] [place] [address] Phone: [phone] Fax: [phone]  Is this the correct pharmacy for this prescription? Yes If no, delete pharmacy and type the correct one.   Has the prescription been filled recently? No  Is the patient out of the medication? Yes  Has the patient been seen for an appointment in the last year OR does the patient have an upcoming appointment? Yes  Can we respond through MyChart? Yes  Agent: Please be advised that Rx refills may take up to 3 business days. We ask that you follow-up with your pharmacy.

## 2023-12-12 ENCOUNTER — Other Ambulatory Visit (HOSPITAL_BASED_OUTPATIENT_CLINIC_OR_DEPARTMENT_OTHER): Payer: Self-pay

## 2023-12-12 ENCOUNTER — Other Ambulatory Visit (INDEPENDENT_AMBULATORY_CARE_PROVIDER_SITE_OTHER): Payer: Self-pay | Admitting: Primary Care

## 2023-12-12 ENCOUNTER — Telehealth (INDEPENDENT_AMBULATORY_CARE_PROVIDER_SITE_OTHER): Payer: Self-pay | Admitting: *Deleted

## 2023-12-13 ENCOUNTER — Telehealth (INDEPENDENT_AMBULATORY_CARE_PROVIDER_SITE_OTHER): Payer: Self-pay | Admitting: Primary Care

## 2023-12-13 ENCOUNTER — Other Ambulatory Visit (HOSPITAL_BASED_OUTPATIENT_CLINIC_OR_DEPARTMENT_OTHER): Payer: Self-pay

## 2023-12-13 MED ORDER — POLYETHYLENE GLYCOL 3350 17 GM/SCOOP PO POWD
17.0000 g | Freq: Every day | ORAL | 0 refills | Status: AC
  Filled 2023-12-13: qty 238, 14d supply, fill #0

## 2023-12-13 MED ORDER — POLYETHYLENE GLYCOL 3350 17 GM/SCOOP PO POWD
17.0000 g | Freq: Every day | ORAL | 0 refills | Status: AC
Start: 2023-12-13 — End: ?

## 2023-12-13 NOTE — Telephone Encounter (Signed)
 Requested medication (s) are due for refill today: yes  Requested medication (s) are on the active medication list: yes  Last refill:  11/20/23  Future visit scheduled: no  Notes to clinic:  Unable to refill per protocol, last refill by another provider.      Requested Prescriptions  Pending Prescriptions Disp Refills   traMADol  (ULTRAM ) 50 MG tablet 12 tablet 0    Sig: Take 1 tablet (50 mg total) by mouth every 6 (six) hours as needed for up to 12 doses.     Not Delegated - Analgesics:  Opioid Agonists Failed - 12/13/2023  2:38 PM      Failed - This refill cannot be delegated      Failed - Urine Drug Screen completed in last 360 days      Passed - Valid encounter within last 3 months    Recent Outpatient Visits           1 week ago Hospital discharge follow-up   [place] [facility] [name], NP   1 month ago Hospital discharge follow-up   [place] [facility] [name], NP   9 months ago Essential hypertension   [place] [facility] [name], NP   1 year ago Toe infection   [place] [facility] [name], NP   1 year ago Essential hypertension   [place] [facility] [name], NP       Future Appointments             In 2 weeks Genelle Standing, MD [facility] [place]             polyethylene glycol powder (GLYCOLAX /MIRALAX ) 17 GM/SCOOP powder 238 g 0    Sig: Take 17 g by mouth daily.     Gastroenterology:  Laxatives Passed - 12/13/2023  2:38 PM      Passed - Valid encounter within last 12 months    Recent Outpatient Visits           1 week ago Hospital discharge follow-up   [place] [facility] [name], NP   1 month ago Hospital discharge follow-up   [place] [facility] [name], NP   9 months ago Essential hypertension    [place] [facility] [name], NP   1 year ago Toe infection   [place] [facility] [name], NP   1 year ago Essential hypertension   [place] [facility] [name], NP       Future Appointments             In 2 weeks Genelle Standing, MD [facility], [place]

## 2023-12-13 NOTE — Telephone Encounter (Signed)
 Copied from CRM [id number]. Topic: Clinical - Medication Refill >> Dec 13, 2023 12:00 PM Delon HERO wrote: Medication: Rx #: [id number]  thiamine  (VITAMIN B1) 100 MG tablet [[id number]]   Rx #: [id number]  folic acid  (FOLVITE ) 1 MG tablet [[id number]]   Rx #: [id number]  polyethylene glycol powder (GLYCOLAX /MIRALAX ) 17 GM/SCOOP powder [[id number]]   Hospital Follow Up was 12/02/2023 with Rosaline Bohr NP   Has the patient contacted their pharmacy? Yes (Agent: If no, request that the patient contact the pharmacy for the refill. If patient does not wish to contact the pharmacy document the reason why and proceed with request.) (Agent: If yes, when and what did the pharmacy advise?)  This is the patient's preferred pharmacy:  [facility] [address], [place] - [address] [place] [address] Phone: [phone] Fax: [phone]   Is this the correct pharmacy for this prescription? Yes If no, delete pharmacy and type the correct one.   Has the prescription been filled recently? Yes  Is the patient out of the medication? Yes  Has the patient been seen for an appointment in the last year OR does the patient have an upcoming appointment? Yes  Can we respond through MyChart? Yes  Agent: Please be advised that Rx refills may take up to 3 business days. We ask that you follow-up with your pharmacy.

## 2023-12-16 ENCOUNTER — Telehealth (INDEPENDENT_AMBULATORY_CARE_PROVIDER_SITE_OTHER): Payer: Self-pay | Admitting: Primary Care

## 2023-12-16 NOTE — Telephone Encounter (Signed)
 Copied from CRM [id number]. Topic: Clinical - Medication Question >> Dec 16, 2023 12:21 PM Sophia H wrote: Reason for CRM: Patient is wanting to know why PCP cannot fill traMADol  (ULTRAM ) 50 MG tablet. This was prescribed in the ED and has no other way to get it filled since he cannot request them from the ED doctor. Was seen by PCP on Jul 28. Patient also wants to know if he should continue to take the other medications prescribed to him while in the ED. Please call patient and advise # [phone]

## 2023-12-18 NOTE — Telephone Encounter (Signed)
 Patient recently requested medication refills but was not refilled by you

## 2023-12-18 NOTE — Telephone Encounter (Signed)
 Was this mentioned in recent visit ?

## 2023-12-23 ENCOUNTER — Other Ambulatory Visit (HOSPITAL_BASED_OUTPATIENT_CLINIC_OR_DEPARTMENT_OTHER): Payer: Self-pay

## 2024-01-01 ENCOUNTER — Ambulatory Visit (INDEPENDENT_AMBULATORY_CARE_PROVIDER_SITE_OTHER): Admitting: Orthopaedic Surgery

## 2024-01-01 ENCOUNTER — Other Ambulatory Visit (HOSPITAL_BASED_OUTPATIENT_CLINIC_OR_DEPARTMENT_OTHER): Payer: Self-pay

## 2024-01-01 ENCOUNTER — Institutional Professional Consult (permissible substitution) (INDEPENDENT_AMBULATORY_CARE_PROVIDER_SITE_OTHER): Payer: MEDICAID | Admitting: Adult Health

## 2024-01-01 DIAGNOSIS — S46001A Unspecified injury of muscle(s) and tendon(s) of the rotator cuff of right shoulder, initial encounter: Secondary | ICD-10-CM

## 2024-01-01 DIAGNOSIS — M25511 Pain in right shoulder: Secondary | ICD-10-CM | POA: Diagnosis not present

## 2024-01-01 MED ORDER — LIDOCAINE HCL 1 % IJ SOLN
4.0000 mL | INTRAMUSCULAR | Status: AC | PRN
  Administered 2024-01-01: 4 mL

## 2024-01-01 MED ORDER — TRIAMCINOLONE ACETONIDE 40 MG/ML IJ SUSP
80.0000 mg | INTRAMUSCULAR | Status: AC | PRN
  Administered 2024-01-01: 80 mg via INTRA_ARTICULAR

## 2024-01-01 NOTE — Progress Notes (Signed)
 Chief Complaint: Right shoulder pain     History of Present Illness:   01/01/2024: Presents today for follow-up of his right shoulder.  He is seeking a right shoulder injection today  [name] is a 63 y.o. male presents today with ongoing right shoulder pain after recent injury where he is throwing a trash bag into his trash bin and suddenly felt a pop with inability to lift the shoulder.  Of note he is 10 years status post previous right shoulder rotator cuff repair.  He was doing well until this occurred.  Since this time he has had very limited overhead range of motion of the shoulder.  He has been having pain with sleep with laying directly on the side.  This is his dominant side.  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    PMH/PSH/Family History/Social History/Meds/Allergies:    Past Medical History:  Diagnosis Date  . Alcohol abuse   . Allergy   . Anxiety   . Arthritis   . Diverticulosis of colon (without mention of hemorrhage)   . GERD (gastroesophageal reflux disease)   . Gout   . Headache   . History of kidney stones   . Hyperlipemia   . Hypertension    dr garrel  norins  . Kidney stones   . Nephrolithiasis   . Pancreatitis    Hx of   . Sleep apnea    possible sleep apnea--no testing done pt states, but his PCP has mentioned testing recently but not set up yet  . Substance abuse (HCC)    alcohol  . Ureteral stent displacement (HCC) 1990   Past Surgical History:  Procedure Laterality Date  . A2 pulley flexor sheath cyst 4th finger excisional biopsy     '03  . ANTERIOR CRUCIATE LIGAMENT REPAIR     R knee  . ESOPHAGOGASTRODUODENOSCOPY N/A 07/09/2012   Procedure: ESOPHAGOGASTRODUODENOSCOPY (EGD);  Surgeon: Lamar JONETTA Aho, MD;  Location: [facility];  Service: Endoscopy;  Laterality: N/A;  . KNEE SURGERY  2008  . LEFT HEART CATH AND CORONARY ANGIOGRAPHY N/A 08/10/2019   Procedure: LEFT HEART CATH AND CORONARY ANGIOGRAPHY;  Surgeon: [name], MD;   Location: [facility];  Service: Cardiovascular;  Laterality: N/A;  . SHOULDER ARTHROSCOPY WITH SUBACROMIAL DECOMPRESSION AND OPEN ROTATOR C Right 12/26/2012   Procedure: RIGHT SHOULDER ARTHROSCOPY WITH SUBACROMIAL DECOMPRESSION MINI OPEN ROTATOR CUFF REPAIR, OPEN BICEP TENODESIS OPEN DISTAL CLAVICLE RESECTION  ;  Surgeon: Elspeth JONELLE Her, MD;  Location: [facility];  Service: Orthopedics;  Laterality: Right;  . TRANSFORAMINAL LUMBAR INTERBODY FUSION (TLIF) WITH PEDICLE SCREW FIXATION 1 LEVEL Right 03/27/2018   Procedure: RIGHT-SIDED LUMBAR 4-5 TRANSFORAMINAL LUMBAR INTERBODY FUSION WITH INSTRUMENTATION AND ALLOGRAFT;  Surgeon: Beuford Anes, MD;  Location: [facility];  Service: Orthopedics;  Laterality: Right;  . URETEROSCOPY     and stone retreval   Social History   Socioeconomic History  . Marital status: Legally Separated    Spouse name: Not on file  . Number of children: 3  . Years of education: 7  . Highest education level: 12th grade  Occupational History    Employer: [employer]    Comment: Rhesa Sauger , retired  Tobacco Use  . Smoking status: Former    Current packs/day: 0.00    Types: Cigarettes    Quit date: 07/10/1998    Years since quitting: 25.4  . Smokeless tobacco: Never  Vaping Use  . Vaping status: Never  Used  Substance and Sexual Activity  . Alcohol use: Yes    Alcohol/week: 12.0 standard drinks of alcohol    Types: 6 Cans of beer, 6 Shots of liquor per week    Comment: 1 pint last night--on Sunday night 11/10/23 (last drink) --drinks daily  . Drug use: No  . Sexual activity: Yes  Other Topics Concern  . Not on file  Social History Narrative   HS[employer]   [employer] groundskeeper   Difficult domestic situation, Married '87- separated '01   2 sons- '82, '91; 1 daughter '91   His niece is 59 with metastatic breast cancer (feb '12)   Social Drivers of Corporate investment banker Strain: Not on file  Food Insecurity: No Food Insecurity (11/12/2023)   Hunger Vital  Sign   . Worried About Programme researcher, broadcasting/film/video in the Last Year: Never true   . Ran Out of Food in the Last Year: Never true  Transportation Needs: No Transportation Needs (11/12/2023)   PRAPARE - Transportation   . Lack of Transportation (Medical): No   . Lack of Transportation (Non-Medical): No  Physical Activity: Not on file  Stress: Not on file  Social Connections: Socially Isolated (11/12/2023)   Social Connection and Isolation Panel   . Frequency of Communication with Friends and Family: More than three times a week   . Frequency of Social Gatherings with Friends and Family: Once a week   . Attends Religious Services: Never   . Active Member of Clubs or Organizations: No   . Attends Banker Meetings: Never   . Marital Status: Separated   Family History  Problem Relation Age of Onset  . Diabetes Mother        x2 brothers  . Hypertension Father        X4 siblings  . Heart disease Father   . Colon cancer Neg Hx   . Esophageal cancer Neg Hx    Allergies  Allergen Reactions  . Hydrocodone -Acetaminophen  Palpitations  . Shrimp [Shellfish Allergy] Other (See Comments)    Cholesterol level becomes very high. Patient reports sweating and itching.   Current Outpatient Medications  Medication Sig Dispense Refill  . amLODipine  (NORVASC ) 10 MG tablet Take 1 tablet (10 mg total) by mouth daily. 90 tablet 3  . atorvastatin  (LIPITOR) 40 MG tablet Take 1 tablet (40 mg total) by mouth daily. 90 tablet 3  . carvedilol  (COREG ) 25 MG tablet Take 1 tablet (25 mg total) by mouth 2 (two) times daily with a meal. 180 tablet 3  . losartan  (COZAAR ) 25 MG tablet Take 1 tablet (25 mg total) by mouth daily. 90 tablet 3  . Multiple Vitamins-Minerals (MULTIVITAMIN WITH MINERALS) tablet Take 1 tablet by mouth daily.    . omeprazole  (PRILOSEC) 40 MG capsule Take 1 capsule (40 mg total) by mouth daily. 30 capsule 0  . polyethylene glycol powder (GLYCOLAX /MIRALAX ) 17 GM/SCOOP powder Take 17 g by  mouth daily. 238 g 0  . polyethylene glycol powder (GLYCOLAX /MIRALAX ) 17 GM/SCOOP powder Take 17 g by mouth daily. 238 g 0  . Tetrahydrozoline HCl (VISINE OP) Place 1 drop into both eyes daily as needed (redness).    . traMADol  (ULTRAM ) 50 MG tablet Take 1 tablet (50 mg total) by mouth every 6 (six) hours as needed for up to 12 doses. 12 tablet 0   No current facility-administered medications for this visit.   No results found.  Review of Systems:   A ROS was performed  including pertinent positives and negatives as documented in the HPI.  Physical Exam :   Constitutional: NAD and appears stated age Neurological: Alert and oriented Psych: Appropriate affect and cooperative There were no vitals taken for this visit.   Comprehensive Musculoskeletal Exam:    Musculoskeletal Exam    Inspection Right Left  Skin No atrophy or winging No atrophy or winging  Palpation    Tenderness Anterior lateral deltoid None  Range of Motion    Flexion (passive) 150 170  Flexion (active) 65 170  Abduction 60 170  ER at the side 30 70  Can reach behind back to L3 T12  Strength     4 out of 5 with forward elevation negative belly press Full  Special Tests    Pseudoparalytic No No  Neurologic    Fires PIN, radial, median, ulnar, musculocutaneous, axillary, suprascapular, long thoracic, and spinal accessory innervated muscles. No abnormal sensibility  Vascular/Lymphatic    Radial Pulse 2+ 2+  Cervical Exam    Patient has symmetric cervical range of motion with negative Spurling's test.  Special Test: Positive drop arm      Imaging:   Xray (3 views right shoulder): Inferior glenohumeral osteophyte consistent with mild osteoarthritis   MRI right shoulder: There is glenohumeral advanced changes significant with arthritis in the setting of previous rotator cuff repair  I personally reviewed and interpreted the radiographs.   Assessment and Plan:   63 y.o. male right-hand-dominant male with  right shoulder pain in the setting of osteoarthritis of the right shoulder with previous rotator cuff repair.  Today's visit I did recommend an initial ultrasound-guided injection of the right shoulder.  He would like to proceed with this today.  I did discuss briefly the possibility of shoulder arthroplasty although he would like to defer surgery at this time.   -Right shoulder ultrasound-guided injection provided after verbal consent obtained    Procedure Note  Patient: [name]             Date of Birth: 1960/09/01           MRN: [id number]             Visit Date: 01/01/2024  Procedures: Visit Diagnoses:  1. Injury of right rotator cuff, initial encounter     Large Joint Inj on 01/01/2024 2:23 PM Indications: pain Details: 22 G 1.5 in needle, ultrasound-guided anterior approach  Arthrogram: No  Medications: 4 mL lidocaine  1 %; 80 mg triamcinolone  acetonide 40 MG/ML Outcome: tolerated well, no immediate complications Procedure, treatment alternatives, risks and benefits explained, specific risks discussed. Consent was given by the patient. Immediately prior to procedure a time out was called to verify the correct patient, procedure, equipment, support staff and site/side marked as required. Patient was prepped and draped in the usual sterile fashion.         I personally saw and evaluated the patient, and participated in the management and treatment plan.  Elspeth Parker, MD Attending Physician, Orthopedic Surgery  This document was dictated using Dragon voice recognition software. A reasonable attempt at proof reading has been made to minimize errors.

## 2024-01-08 ENCOUNTER — Ambulatory Visit (HOSPITAL_BASED_OUTPATIENT_CLINIC_OR_DEPARTMENT_OTHER): Admitting: Orthopaedic Surgery

## 2024-01-08 ENCOUNTER — Other Ambulatory Visit (HOSPITAL_BASED_OUTPATIENT_CLINIC_OR_DEPARTMENT_OTHER): Payer: Self-pay

## 2024-01-08 DIAGNOSIS — S46001A Unspecified injury of muscle(s) and tendon(s) of the rotator cuff of right shoulder, initial encounter: Secondary | ICD-10-CM

## 2024-01-08 NOTE — Progress Notes (Signed)
 Chief Complaint: Right shoulder pain     History of Present Illness:   01/08/2024: Presents today for follow-up of his right shoulder.  Unfortunately he did not get any relief from his previous injection the previous week.  [name] is a 63 y.o. male presents today with ongoing right shoulder pain after recent injury where he is throwing a trash bag into his trash bin and suddenly felt a pop with inability to lift the shoulder.  Of note he is 10 years status post previous right shoulder rotator cuff repair.  He was doing well until this occurred.  Since this time he has had very limited overhead range of motion of the shoulder.  He has been having pain with sleep with laying directly on the side.  This is his dominant side.  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    PMH/PSH/Family History/Social History/Meds/Allergies:    Past Medical History:  Diagnosis Date   Alcohol abuse    Allergy    Anxiety    Arthritis    Diverticulosis of colon (without mention of hemorrhage)    GERD (gastroesophageal reflux disease)    Gout    Headache    History of kidney stones    Hyperlipemia    Hypertension    dr garrel  norins   Kidney stones    Nephrolithiasis    Pancreatitis    Hx of    Sleep apnea    possible sleep apnea--no testing done pt states, but his PCP has mentioned testing recently but not set up yet   Substance abuse (HCC)    alcohol   Ureteral stent displacement (HCC) 1990   Past Surgical History:  Procedure Laterality Date   A2 pulley flexor sheath cyst 4th finger excisional biopsy     '03   ANTERIOR CRUCIATE LIGAMENT REPAIR     R knee   ESOPHAGOGASTRODUODENOSCOPY N/A 07/09/2012   Procedure: ESOPHAGOGASTRODUODENOSCOPY (EGD);  Surgeon: Lamar JONETTA Aho, MD;  Location: [facility];  Service: Endoscopy;  Laterality: N/A;   KNEE SURGERY  2008   LEFT HEART CATH AND CORONARY ANGIOGRAPHY N/A 08/10/2019   Procedure: LEFT HEART CATH AND CORONARY ANGIOGRAPHY;  Surgeon:  [name], MD;  Location: [facility];  Service: Cardiovascular;  Laterality: N/A;   SHOULDER ARTHROSCOPY WITH SUBACROMIAL DECOMPRESSION AND OPEN ROTATOR C Right 12/26/2012   Procedure: RIGHT SHOULDER ARTHROSCOPY WITH SUBACROMIAL DECOMPRESSION MINI OPEN ROTATOR CUFF REPAIR, OPEN BICEP TENODESIS OPEN DISTAL CLAVICLE RESECTION  ;  Surgeon: Elspeth JONELLE Her, MD;  Location: [facility];  Service: Orthopedics;  Laterality: Right;   TRANSFORAMINAL LUMBAR INTERBODY FUSION (TLIF) WITH PEDICLE SCREW FIXATION 1 LEVEL Right 03/27/2018   Procedure: RIGHT-SIDED LUMBAR 4-5 TRANSFORAMINAL LUMBAR INTERBODY FUSION WITH INSTRUMENTATION AND ALLOGRAFT;  Surgeon: Beuford Anes, MD;  Location: [facility];  Service: Orthopedics;  Laterality: Right;   URETEROSCOPY     and stone retreval   Social History   Socioeconomic History   Marital status: Legally Separated    Spouse name: Not on file   Number of children: 3   Years of education: 12   Highest education level: 12th grade  Occupational History    Employer: [employer]    Comment: Pharmacologist , retired  Tobacco Use   Smoking status: Former    Current packs/day: 0.00    Types: Cigarettes    Quit date: 07/10/1998    Years since quitting: 25.5   Smokeless tobacco: Never  Vaping  Use   Vaping status: Never Used  Substance and Sexual Activity   Alcohol use: Yes    Alcohol/week: 12.0 standard drinks of alcohol    Types: 6 Cans of beer, 6 Shots of liquor per week    Comment: 1 pint last night--on Sunday night 11/10/23 (last drink) --drinks daily   Drug use: No   Sexual activity: Yes  Other Topics Concern   Not on file  Social History Narrative   HS[employer]   [employer] groundskeeper   Difficult domestic situation, Married '87- separated '01   2 sons- '82, '91; 1 daughter '91   His niece is 51 with metastatic breast cancer (feb '12)   Social Drivers of Corporate investment banker Strain: Not on file  Food Insecurity: No Food Insecurity (11/12/2023)    Hunger Vital Sign    Worried About Running Out of Food in the Last Year: Never true    Ran Out of Food in the Last Year: Never true  Transportation Needs: No Transportation Needs (11/12/2023)   PRAPARE - Administrator, Civil Service (Medical): No    Lack of Transportation (Non-Medical): No  Physical Activity: Not on file  Stress: Not on file  Social Connections: Socially Isolated (11/12/2023)   Social Connection and Isolation Panel    Frequency of Communication with Friends and Family: More than three times a week    Frequency of Social Gatherings with Friends and Family: Once a week    Attends Religious Services: Never    Database administrator or Organizations: No    Attends Engineer, structural: Never    Marital Status: Separated   Family History  Problem Relation Age of Onset   Diabetes Mother        x2 brothers   Hypertension Father        X4 siblings   Heart disease Father    Colon cancer Neg Hx    Esophageal cancer Neg Hx    Allergies  Allergen Reactions   Hydrocodone -Acetaminophen  Palpitations   Shrimp [Shellfish Allergy] Other (See Comments)    Cholesterol level becomes very high. Patient reports sweating and itching.   Current Outpatient Medications  Medication Sig Dispense Refill   amLODipine  (NORVASC ) 10 MG tablet Take 1 tablet (10 mg total) by mouth daily. 90 tablet 3   atorvastatin  (LIPITOR) 40 MG tablet Take 1 tablet (40 mg total) by mouth daily. 90 tablet 3   carvedilol  (COREG ) 25 MG tablet Take 1 tablet (25 mg total) by mouth 2 (two) times daily with a meal. 180 tablet 3   losartan  (COZAAR ) 25 MG tablet Take 1 tablet (25 mg total) by mouth daily. 90 tablet 3   Multiple Vitamins-Minerals (MULTIVITAMIN WITH MINERALS) tablet Take 1 tablet by mouth daily.     omeprazole  (PRILOSEC) 40 MG capsule Take 1 capsule (40 mg total) by mouth daily. 30 capsule 0   polyethylene glycol powder (GLYCOLAX /MIRALAX ) 17 GM/SCOOP powder Take 17 g by mouth daily.  238 g 0   polyethylene glycol powder (GLYCOLAX /MIRALAX ) 17 GM/SCOOP powder Take 17 g by mouth daily. 238 g 0   Tetrahydrozoline HCl (VISINE OP) Place 1 drop into both eyes daily as needed (redness).     traMADol  (ULTRAM ) 50 MG tablet Take 1 tablet (50 mg total) by mouth every 6 (six) hours as needed for up to 12 doses. 12 tablet 0   No current facility-administered medications for this visit.   No results found.  Review of Systems:  A ROS was performed including pertinent positives and negatives as documented in the HPI.  Physical Exam :   Constitutional: NAD and appears stated age Neurological: Alert and oriented Psych: Appropriate affect and cooperative There were no vitals taken for this visit.   Comprehensive Musculoskeletal Exam:    Musculoskeletal Exam    Inspection Right Left  Skin No atrophy or winging No atrophy or winging  Palpation    Tenderness Anterior lateral deltoid None  Range of Motion    Flexion (passive) 150 170  Flexion (active) 65 170  Abduction 60 170  ER at the side 30 70  Can reach behind back to L3 T12  Strength     4 out of 5 with forward elevation negative belly press Full  Special Tests    Pseudoparalytic No No  Neurologic    Fires PIN, radial, median, ulnar, musculocutaneous, axillary, suprascapular, long thoracic, and spinal accessory innervated muscles. No abnormal sensibility  Vascular/Lymphatic    Radial Pulse 2+ 2+  Cervical Exam    Patient has symmetric cervical range of motion with negative Spurling's test.  Special Test: Positive drop arm      Imaging:   Xray (3 views right shoulder): Inferior glenohumeral osteophyte consistent with mild osteoarthritis   MRI right shoulder: There is glenohumeral advanced changes significant with arthritis in the setting of previous rotator cuff repair  I personally reviewed and interpreted the radiographs.   Assessment and Plan:   63 y.o. male right-hand-dominant male with right shoulder  pain in the setting of osteoarthritis of the right shoulder with previous rotator cuff repair.  Today's visit I did recommend an initial ultrasound-guided injection of the right shoulder.  He would like to proceed with this today.  I did discuss briefly the possibility of shoulder arthroplasty although he would like to defer surgery at this time.   -Right shoulder ultrasound-guided injection provided after verbal consent obtained     I personally saw and evaluated the patient, and participated in the management and treatment plan.  Elspeth Parker, MD Attending Physician, Orthopedic Surgery  This document was dictated using Dragon voice recognition software. A reasonable attempt at proof reading has been made to minimize errors.

## 2024-01-14 ENCOUNTER — Ambulatory Visit: Payer: MEDICAID | Admitting: Physician Assistant

## 2024-01-29 ENCOUNTER — Ambulatory Visit (INDEPENDENT_AMBULATORY_CARE_PROVIDER_SITE_OTHER): Payer: MEDICAID | Admitting: Primary Care

## 2024-02-06 ENCOUNTER — Telehealth: Payer: Self-pay

## 2024-02-06 NOTE — Telephone Encounter (Signed)
 Copied from CRM [id number]. Topic: General - Other >> Feb 06, 2024  3:54 PM Sophia H wrote: Reason for CRM: [employer] - was told there are certain steps he needs to take prior to joining the program and wanted to follow up. Patient states he was told by Kellen that someone would reach out to him but he is not sure who exactly. Please advise. # [phone]

## 2024-02-13 ENCOUNTER — Telehealth: Payer: Self-pay | Admitting: Cardiovascular Disease

## 2024-02-13 NOTE — Telephone Encounter (Signed)
*  STAT* If patient is at the pharmacy, call can be transferred to refill team.   1. Which medications need to be refilled? (please list name of each medication and dose if known)   carvedilol  (COREG ) 25 MG tablet   2. Would you like to learn more about the convenience, safety, & potential cost savings by using the [facility]?   3. Are you open to using the [facility] (Type [facility]. ).  4. Which pharmacy/location (including street and city if local pharmacy) is medication to be sent to?  [facility] - [place], [place] - [address]   5. Do they need a 30 day or 90 day supply?   90 day  Patient stated he has 4 days left of this medication.

## 2024-02-13 NOTE — Telephone Encounter (Signed)
 RX sent in on 11/05/23. Pharmacy verified. Called Pt to inform. Pt verbalized understanding.

## 2024-03-03 ENCOUNTER — Encounter (INDEPENDENT_AMBULATORY_CARE_PROVIDER_SITE_OTHER): Payer: Self-pay | Admitting: Primary Care

## 2024-03-03 ENCOUNTER — Ambulatory Visit (INDEPENDENT_AMBULATORY_CARE_PROVIDER_SITE_OTHER): Admitting: Primary Care

## 2024-03-03 ENCOUNTER — Other Ambulatory Visit (INDEPENDENT_AMBULATORY_CARE_PROVIDER_SITE_OTHER): Payer: Self-pay | Admitting: Primary Care

## 2024-03-03 VITALS — BP 112/73 | HR 75 | Resp 16 | Wt 254.4 lb

## 2024-03-03 DIAGNOSIS — N63 Unspecified lump in unspecified breast: Secondary | ICD-10-CM | POA: Diagnosis not present

## 2024-03-03 NOTE — Progress Notes (Signed)
 Diagnostic  [facility]  [name], is a 63 y.o. male  RDW:[id number]  FMW:[id number]  DOB - 1960-08-09  Chief Complaint  Patient presents with   Hypertension   Insomnia    Got worse over the years       Subjective:   [name] is a 63 y.o. male here today for a follow up visit HTN. Patient has No headache, No chest pain, No abdominal pain - No Nausea, No new weakness tingling or numbness, No Cough - shortness of breath. Suspicious mass chest appeared approx. 3 months ago -2 inches X 1/4 3-4 oclock outer edge.  Hypertension  Insomnia    No problems updated.  Comprehensive ROS Pertinent positive and negative noted in HPI   Allergies  Allergen Reactions   Hydrocodone -Acetaminophen  Palpitations   Shrimp [Shellfish Allergy] Other (See Comments)    Cholesterol level becomes very high. Patient reports sweating and itching.    Past Medical History:  Diagnosis Date   Alcohol abuse    Allergy    Anxiety    Arthritis    Diverticulosis of colon (without mention of hemorrhage)    GERD (gastroesophageal reflux disease)    Gout    Headache    History of kidney stones    Hyperlipemia    Hypertension    dr garrel  norins   Kidney stones    Nephrolithiasis    Pancreatitis    Hx of    Sleep apnea    possible sleep apnea--no testing done pt states, but his PCP has mentioned testing recently but not set up yet   Substance abuse (HCC)    alcohol   Ureteral stent displacement 1990    Current Outpatient Medications on File Prior to Visit  Medication Sig Dispense Refill   amLODipine  (NORVASC ) 10 MG tablet Take 1 tablet (10 mg total) by mouth daily. 90 tablet 3   atorvastatin  (LIPITOR) 40 MG tablet Take 1 tablet (40 mg total) by mouth daily. 90 tablet 3   carvedilol  (COREG ) 25 MG tablet Take 1 tablet (25 mg total) by mouth 2 (two) times daily with a meal. 180 tablet 3   losartan  (COZAAR ) 25 MG tablet Take 1 tablet (25 mg total) by mouth daily. 90 tablet 3    Multiple Vitamins-Minerals (MULTIVITAMIN WITH MINERALS) tablet Take 1 tablet by mouth daily.     omeprazole  (PRILOSEC) 40 MG capsule Take 1 capsule (40 mg total) by mouth daily. 30 capsule 0   polyethylene glycol powder (GLYCOLAX /MIRALAX ) 17 GM/SCOOP powder Take 17 g by mouth daily. 238 g 0   polyethylene glycol powder (GLYCOLAX /MIRALAX ) 17 GM/SCOOP powder Take 17 g by mouth daily. 238 g 0   Tetrahydrozoline HCl (VISINE OP) Place 1 drop into both eyes daily as needed (redness).     traMADol  (ULTRAM ) 50 MG tablet Take 1 tablet (50 mg total) by mouth every 6 (six) hours as needed for up to 12 doses. 12 tablet 0   [DISCONTINUED] diphenhydrAMINE  (BENADRYL ) 25 MG tablet Take 1 tablet (25 mg total) by mouth every 6 (six) hours as needed. 30 tablet 0   No current facility-administered medications on file prior to visit.   Health Maintenance  Topic Date Due   Zoster (Shingles) Vaccine (1 of 2) Never done   Pneumococcal Vaccine for age over 22 (1 of 1 - PCV) Never done   COVID-19 Vaccine (2 - Pfizer risk series) 02/19/2020   Flu Shot  08/04/2024*   Colon Cancer Screening  11/09/2031  DTaP/Tdap/Td vaccine (4 - Td or Tdap) 03/11/2033   Hepatitis C Screening  Completed   HIV Screening  Completed   Hepatitis B Vaccine  Aged Out   HPV Vaccine  Aged Out   Meningitis B Vaccine  Aged Out  *Topic was postponed. The date shown is not the original due date.    Objective:   Vitals:   03/03/24 1408  BP: 112/73  Pulse: 75  Resp: 16  SpO2: 97%  Weight: 254 lb 6.4 oz (115.4 kg)   BP Readings from Last 3 Encounters:  03/04/24 130/80  03/03/24 112/73  12/02/23 100/68      Physical Exam Vitals reviewed.  Constitutional:      Appearance: He is obese.  HENT:     Head: Normocephalic.     Right Ear: Tympanic membrane and external ear normal.     Left Ear: Tympanic membrane and external ear normal.     Nose: Nose normal.  Eyes:     Extraocular Movements: Extraocular movements intact.     Pupils:  Pupils are equal, round, and reactive to light.  Cardiovascular:     Rate and Rhythm: Normal rate and regular rhythm.  Pulmonary:     Effort: Pulmonary effort is normal.     Breath sounds: Normal breath sounds.  Chest:     Comments: mass chest appeared approx. 3 months ago -2 inches X 1/4 3-4 oclock outer edge.   Abdominal:     General: Bowel sounds are normal. There is distension.     Palpations: Abdomen is soft.  Musculoskeletal:        General: Normal range of motion.     Cervical back: Normal range of motion and neck supple.  Skin:    General: Skin is warm and dry.  Neurological:     Mental Status: He is oriented to person, place, and time.  Psychiatric:        Mood and Affect: Mood normal.        Behavior: Behavior normal.        Thought Content: Thought content normal.        Judgment: Judgment normal.       Assessment & Plan  [name] was seen today for hypertension and insomnia.  Diagnoses and all orders for this visit:  Breast mass in male -     Cancel: MM 3D DIAGNOSTIC MAMMOGRAM UNILATERAL LEFT BREAST; Future -     US  BREAST COMPLETE UNI LEFT INC AXILLA; Future -     MM 3D DIAGNOSTIC MAMMOGRAM UNILATERAL LEFT BREAST; Future -     MM 3D DIAGNOSTIC MAMMOGRAM BILATERAL BREAST; Future     Patient have been counseled extensively about nutrition and exercise. Other issues discussed during this visit include: low cholesterol diet, weight control and daily exercise, foot care, annual eye examinations at Ophthalmology, importance of adherence with medications and regular follow-up. We also discussed long term complications of uncontrolled diabetes and hypertension.   No follow-ups on file.  The patient was given clear instructions to go to ER or return to medical center if symptoms don't improve, worsen or new problems develop. The patient verbalized understanding. The patient was told to call to get lab results if they haven't heard anything in the next week.   This  note has been created with Education officer, environmental. Any transcriptional errors are unintentional.   Rosaline SHAUNNA Bohr, NP 03/03/2024, 2:38 PM

## 2024-03-04 ENCOUNTER — Other Ambulatory Visit

## 2024-03-04 ENCOUNTER — Encounter: Payer: Self-pay | Admitting: Physician Assistant

## 2024-03-04 ENCOUNTER — Ambulatory Visit (INDEPENDENT_AMBULATORY_CARE_PROVIDER_SITE_OTHER): Payer: MEDICAID | Admitting: Physician Assistant

## 2024-03-04 VITALS — BP 130/80 | HR 71 | Ht 72.0 in | Wt 255.0 lb

## 2024-03-04 DIAGNOSIS — D649 Anemia, unspecified: Secondary | ICD-10-CM | POA: Diagnosis not present

## 2024-03-04 DIAGNOSIS — F1091 Alcohol use, unspecified, in remission: Secondary | ICD-10-CM

## 2024-03-04 DIAGNOSIS — K852 Alcohol induced acute pancreatitis without necrosis or infection: Secondary | ICD-10-CM

## 2024-03-04 DIAGNOSIS — K429 Umbilical hernia without obstruction or gangrene: Secondary | ICD-10-CM | POA: Diagnosis not present

## 2024-03-04 LAB — CBC WITH DIFFERENTIAL/PLATELET
Basophils Absolute: 0.1 K/uL (ref 0.0–0.1)
Basophils Relative: 0.6 % (ref 0.0–3.0)
Eosinophils Absolute: 0.1 K/uL (ref 0.0–0.7)
Eosinophils Relative: 1 % (ref 0.0–5.0)
HCT: 40.5 % (ref 39.0–52.0)
Hemoglobin: 13.5 g/dL (ref 13.0–17.0)
Lymphocytes Relative: 23.3 % (ref 12.0–46.0)
Lymphs Abs: 2.2 K/uL (ref 0.7–4.0)
MCHC: 33.4 g/dL (ref 30.0–36.0)
MCV: 91.6 fl (ref 78.0–100.0)
Monocytes Absolute: 0.7 K/uL (ref 0.1–1.0)
Monocytes Relative: 8 % (ref 3.0–12.0)
Neutro Abs: 6.3 K/uL (ref 1.4–7.7)
Neutrophils Relative %: 67.1 % (ref 43.0–77.0)
Platelets: 226 K/uL (ref 150.0–400.0)
RBC: 4.42 Mil/uL (ref 4.22–5.81)
RDW: 13.8 % (ref 11.5–15.5)
WBC: 9.4 K/uL (ref 4.0–10.5)

## 2024-03-04 LAB — COMPREHENSIVE METABOLIC PANEL WITH GFR
ALT: 9 U/L (ref 0–53)
AST: 11 U/L (ref 0–37)
Albumin: 4.6 g/dL (ref 3.5–5.2)
Alkaline Phosphatase: 36 U/L — ABNORMAL LOW (ref 39–117)
BUN: 17 mg/dL (ref 6–23)
CO2: 26 meq/L (ref 19–32)
Calcium: 9.2 mg/dL (ref 8.4–10.5)
Chloride: 106 meq/L (ref 96–112)
Creatinine, Ser: 1.16 mg/dL (ref 0.40–1.50)
GFR: 67.71 mL/min (ref >60.00)
Glucose, Bld: 103 mg/dL — ABNORMAL HIGH (ref 70–99)
Potassium: 3.9 meq/L (ref 3.5–5.1)
Sodium: 141 meq/L (ref 135–145)
Total Bilirubin: 0.8 mg/dL (ref 0.2–1.2)
Total Protein: 7.5 g/dL (ref 6.0–8.3)

## 2024-03-04 LAB — B12 AND FOLATE PANEL
Folate: 18.3 ng/mL (ref >5.9)
Vitamin B-12: 372 pg/mL (ref 211–911)

## 2024-03-04 LAB — LIPASE: Lipase: 7 U/L — ABNORMAL LOW (ref 11.0–59.0)

## 2024-03-04 NOTE — Progress Notes (Signed)
 Noted

## 2024-03-04 NOTE — Progress Notes (Signed)
 Ellouise Console, PA-C [address] [place], [place]  [address] Phone: [phone]   Primary Care Physician: [name], NP  Primary Gastroenterologist:  Ellouise Console, PA-C / Norleen Kiang, MD   Chief Complaint: Hospital follow-up of alcohol-induced pancreatitis       HPI:   Discussed the use of AI scribe software for clinical note transcription with the patient, who gave verbal consent to proceed.  [name] is a 63 year old male with a history of pancreatitis who presents for follow-up after hospitalization.  Patient was admitted to hospital for alcoholic pancreatitis 11/11/2023 until 11/20/2023.  History of alcohol use disorder and recurrent alcohol related pancreatitis.  History of hypertension and hyperlipidemia.  He was drinking a pint of liquor or sixpack of beer per day.  Has attended rehab for alcohol use in the past but had relapse.  He also had ileus during hospitalization.  Condition improved.  Patient reported having black stools and there was question of duodenitis or gastritis.  He was treated with PPI.  Hemoglobin stable.  Had hypokalemia and hypomagnesia which were treated and resolved. History of Present Illness He was hospitalized from July 7th to July 16th, 2025, for pancreatitis. During the hospitalization, a CT scan revealed inflammation of the pancreas, and his lipase levels were elevated. Currently, he has no abdominal pain, nausea, vomiting, diarrhea, constipation, heartburn, trouble swallowing, or changes in stool color. He recalls significant pain and an inability to eat during the hospitalization.  He has abstained from alcohol since his hospitalization 3 months ago. He has a history of significant alcohol consumption, stating he 'used to probably drink every day.' He is committed to maintaining sobriety, citing family considerations as a motivating factor.  He has a history of an umbilical hernia, which is not currently causing pain or  discomfort.  11/12/2023 CT abdomen pelvis with contrast: 1. Acute interstitial pancreatitis with moderate peripancreatic inflammatory reaction and nonlocalized patchy fluid extending into the hypogastrium, perigastrium and lesser sac, and layering over the bilateral Gerota's fascia with small amount collecting in the pelvis. No free air or abscess.   No pancreatic cyst. 2. Thickened folds in the duodenum probably reactive due to the pancreatitis or could be coexisting duodenitis. 3. Diverticulosis without evidence of diverticulitis. 4. Aortic and coronary artery atherosclerosis. 5. Small hiatal hernia. 6. Umbilical and inguinal fat hernias. 7. Bosniak 1 and 2 renal cysts. 8.  Normal liver, gallbladder, and bile ducts.  No evidence of cirrhosis.   12/02/2023 last labs: Hemoglobin 11.4, WBC 9.8, MCV 93, platelet 473, glucose 128, BUN 11, creatinine 1.28, GFR 64, normal LFTs, potassium 4.1.  11/2021 EGD by Dr. Kiang: Large caliber distal esophageal ring, otherwise normal esophagus.  Normal stomach and duodenum.  11/2021 colonoscopy by Dr. Kiang: 1 small 1 mm tubular adenoma polyp removed from ascending colon.  Diverticulosis.  Moderate internal hemorrhoids.  10-year repeat.  Component Ref Range & Units (hover) 3 mo ago (11/11/23) 2 yr ago (09/14/21) 5 yr ago (05/29/18) 5 yr ago (05/21/18) 5 yr ago (05/19/18) 7 yr ago (10/05/16) 9 yr ago (05/27/14)  Lipase 1,256 High  40 CM 63 High  CM 80 High  CM 146 High  CM 20 31 R    Current Outpatient Medications  Medication Sig Dispense Refill   amLODipine  (NORVASC ) 10 MG tablet Take 1 tablet (10 mg total) by mouth daily. 90 tablet 3   atorvastatin  (LIPITOR) 40 MG tablet Take 1 tablet (40 mg total) by mouth  daily. 90 tablet 3   carvedilol  (COREG ) 25 MG tablet Take 1 tablet (25 mg total) by mouth 2 (two) times daily with a meal. 180 tablet 3   losartan  (COZAAR ) 25 MG tablet Take 1 tablet (25 mg total) by mouth daily. 90 tablet 3   Multiple  Vitamins-Minerals (MULTIVITAMIN WITH MINERALS) tablet Take 1 tablet by mouth daily.     omeprazole  (PRILOSEC) 40 MG capsule Take 1 capsule (40 mg total) by mouth daily. 30 capsule 0   Tetrahydrozoline HCl (VISINE OP) Place 1 drop into both eyes daily as needed (redness).     polyethylene glycol powder (GLYCOLAX /MIRALAX ) 17 GM/SCOOP powder Take 17 g by mouth daily. (Patient not taking: Reported on 03/04/2024) 238 g 0   polyethylene glycol powder (GLYCOLAX /MIRALAX ) 17 GM/SCOOP powder Take 17 g by mouth daily. (Patient not taking: Reported on 03/04/2024) 238 g 0   traMADol  (ULTRAM ) 50 MG tablet Take 1 tablet (50 mg total) by mouth every 6 (six) hours as needed for up to 12 doses. (Patient not taking: Reported on 03/04/2024) 12 tablet 0   No current facility-administered medications for this visit.    Allergies as of 03/04/2024 - Review Complete 03/04/2024  Allergen Reaction Noted   Hydrocodone -acetaminophen  Palpitations 11/23/2013   Shrimp [shellfish allergy] Other (See Comments) 08/16/2012    Past Medical History:  Diagnosis Date   Alcohol abuse    Allergy    Anxiety    Arthritis    Diverticulosis of colon (without mention of hemorrhage)    GERD (gastroesophageal reflux disease)    Gout    Headache    History of kidney stones    Hyperlipemia    Hypertension    dr garrel  norins   Kidney stones    Nephrolithiasis    Pancreatitis    Hx of    Sleep apnea    possible sleep apnea--no testing done pt states, but his PCP has mentioned testing recently but not set up yet   Substance abuse (HCC)    alcohol   Ureteral stent displacement 1990    Past Surgical History:  Procedure Laterality Date   A2 pulley flexor sheath cyst 4th finger excisional biopsy     '03   ANTERIOR CRUCIATE LIGAMENT REPAIR     R knee   ESOPHAGOGASTRODUODENOSCOPY N/A 07/09/2012   Procedure: ESOPHAGOGASTRODUODENOSCOPY (EGD);  Surgeon: Lamar JONETTA Aho, MD;  Location: [facility];  Service: Endoscopy;  Laterality:  N/A;   KNEE SURGERY  2008   LEFT HEART CATH AND CORONARY ANGIOGRAPHY N/A 08/10/2019   Procedure: LEFT HEART CATH AND CORONARY ANGIOGRAPHY;  Surgeon: [name], MD;  Location: [facility];  Service: Cardiovascular;  Laterality: N/A;   SHOULDER ARTHROSCOPY WITH SUBACROMIAL DECOMPRESSION AND OPEN ROTATOR C Right 12/26/2012   Procedure: RIGHT SHOULDER ARTHROSCOPY WITH SUBACROMIAL DECOMPRESSION MINI OPEN ROTATOR CUFF REPAIR, OPEN BICEP TENODESIS OPEN DISTAL CLAVICLE RESECTION  ;  Surgeon: Elspeth JONELLE Her, MD;  Location: [facility];  Service: Orthopedics;  Laterality: Right;   TRANSFORAMINAL LUMBAR INTERBODY FUSION (TLIF) WITH PEDICLE SCREW FIXATION 1 LEVEL Right 03/27/2018   Procedure: RIGHT-SIDED LUMBAR 4-5 TRANSFORAMINAL LUMBAR INTERBODY FUSION WITH INSTRUMENTATION AND ALLOGRAFT;  Surgeon: Beuford Anes, MD;  Location: [facility];  Service: Orthopedics;  Laterality: Right;   URETEROSCOPY     and stone retreval    Review of Systems:    All systems reviewed and negative except where noted in HPI.    Physical Exam:  BP 130/80   Pulse 71  Ht 6' (1.829 m)   Wt 255 lb (115.7 kg)   BMI 34.58 kg/m  No LMP for male patient.  General: Well-nourished, well-developed in no acute distress.  Lungs: Clear to auscultation bilaterally. Non-labored. Heart: Regular rate and rhythm, no murmurs rubs or gallops.  Abdomen: Bowel sounds are normal; Abdomen is Soft; No hepatosplenomegaly, or masses;  No Abdominal Tenderness; No guarding or rebound tenderness.  There is a medium size reducible nontender umbilical hernia. Neuro: Alert and oriented x 3.  Grossly intact.  Psych: Alert and cooperative, normal mood and affect.  Imaging Studies: No results found.  Labs: CBC    Component Value Date/Time   WBC 9.4 03/04/2024 1512   RBC 4.42 03/04/2024 1512   HGB 13.5 03/04/2024 1512   HGB 11.4 (L) 12/02/2023 1542   HCT 40.5 03/04/2024 1512   HCT 35.5 (L) 12/02/2023 1542   PLT 226.0 03/04/2024 1512   PLT  473 (H) 12/02/2023 1542   MCV 91.6 03/04/2024 1512   MCV 93 12/02/2023 1542   MCH 29.8 12/02/2023 1542   MCH 30.9 11/18/2023 0705   MCHC 33.4 03/04/2024 1512   RDW 13.8 03/04/2024 1512   RDW 12.5 12/02/2023 1542   LYMPHSABS 2.2 03/04/2024 1512   LYMPHSABS 1.9 12/02/2023 1542   MONOABS 0.7 03/04/2024 1512   EOSABS 0.1 03/04/2024 1512   EOSABS 0.2 12/02/2023 1542   BASOSABS 0.1 03/04/2024 1512   BASOSABS 0.1 12/02/2023 1542    CMP     Component Value Date/Time   NA 141 03/04/2024 1512   NA 142 12/02/2023 1542   K 3.9 03/04/2024 1512   CL 106 03/04/2024 1512   CO2 26 03/04/2024 1512   GLUCOSE 103 (H) 03/04/2024 1512   BUN 17 03/04/2024 1512   BUN 11 12/02/2023 1542   CREATININE 1.16 03/04/2024 1512   CALCIUM  9.2 03/04/2024 1512   PROT 7.5 03/04/2024 1512   PROT 7.0 12/02/2023 1542   ALBUMIN  4.6 03/04/2024 1512   ALBUMIN  4.4 12/02/2023 1542   AST 11 03/04/2024 1512   ALT 9 03/04/2024 1512   ALKPHOS 36 (L) 03/04/2024 1512   BILITOT 0.8 03/04/2024 1512   BILITOT 0.5 12/02/2023 1542   GFRNONAA >60 11/18/2023 2034   GFRAA 67 06/17/2020 2020     Assessment and Plan:   Greysin Medlen is a 63 y.o. y/o male returns for follow-up of:  1.  Alcohol-induced pancreatitis Resolved acute pancreatitis post-hospitalization. Likely alcohol-induced. No gallstones or liver cirrhosis on CT. Discussed pseudocyst formation, not suspected. - Labs: Lipase, CMP - Schedule MRI of the pancreas to ensure resolution and check for pseudocysts. - Advise to report any new symptoms such as diarrhea. - Continue to abstain from all alcohol.  Last drink of alcohol was 3 months ago.  2.  Alcohol use disorder in remission. Abstinent since July hospitalization. Discussed genetic predisposition and provided support resources. Motivated by family and well-being. - Continue abstinence from alcohol. - Offer resources for support if needed, including Alcoholics Anonymous and inpatient rehab programs such as  [facility].  3.  Umbilical hernia, asymptomatic Asymptomatic, reducible umbilical hernia. No surgical intervention needed unless symptomatic. - Monitor for symptoms such as pain or inability to reduce hernia. - Recommend surgical consultation if hernia becomes symptomatic.  4.  Anemia, Unspecified - Labs: CBC,  iron panel, ferritin, B12, folate.   Ellouise Console, PA-C  Follow up based on above test results and GI symptoms.

## 2024-03-04 NOTE — Patient Instructions (Addendum)
 Your provider has requested that you go to the basement level for lab work before leaving today. Press B on the elevator. The lab is located at the first door on the left as you exit the elevator.  You have been scheduled for an MRI at [facility] on 03/12/24. Your appointment time is 3:00 pm. Please arrive to admitting (at main entrance of the hospital) 30 minutes prior to your appointment time for registration purposes. Please make certain not to have anything to eat or drink after 11:00 am prior to your test. In addition, if you have any metal in your body, have a pacemaker or defibrillator, please be sure to let your ordering physician know. This test typically takes 45 minutes to 1 hour to complete. Should you need to reschedule, please call [phone] to do so.  Please follow up sooner if symptoms increase or worsen  Due to recent changes in healthcare laws, you may see the results of your imaging and laboratory studies on MyChart before your provider has had a chance to review them.  We understand that in some cases there may be results that are confusing or concerning to you. Not all laboratory results come back in the same time frame and the provider may be waiting for multiple results in order to interpret others.  Please give us  48 hours in order for your provider to thoroughly review all the results before contacting the office for clarification of your results.   Thank you for trusting me with your gastrointestinal care!   Ellouise Console, PA-C _______________________________________________________  If your blood pressure at your visit was 140/90 or greater, please contact your primary care physician to follow up on this.  _______________________________________________________  If you are age 35 or older, your body mass index should be between 23-30. Your Body mass index is 34.58 kg/m. If this is out of the aforementioned range listed, please consider follow up with your  Primary Care Provider.  If you are age 78 or younger, your body mass index should be between 19-25. Your Body mass index is 34.58 kg/m. If this is out of the aformentioned range listed, please consider follow up with your Primary Care Provider.   ________________________________________________________  The [facility] providers would like to encourage you to use MYCHART to communicate with providers for non-urgent requests or questions.  Due to long hold times on the telephone, sending your provider a message by [facility] may be a faster and more efficient way to get a response.  Please allow 48 business hours for a response.  Please remember that this is for non-urgent requests.  _______________________________________________________

## 2024-03-05 ENCOUNTER — Ambulatory Visit: Payer: Self-pay | Admitting: Physician Assistant

## 2024-03-05 LAB — IRON,TIBC AND FERRITIN PANEL
%SAT: 28 % (ref 20–48)
Ferritin: 26 ng/mL (ref 24–380)
Iron: 96 ug/dL (ref 50–180)
TIBC: 341 ug/dL (ref 250–425)

## 2024-03-06 NOTE — Telephone Encounter (Signed)
 Copied from CRM [id number]. Topic: Referral - Question >> Mar 05, 2024  1:56 PM Winona R wrote: Pt states he was suppose to get a referral for sleep medicine, but do not see it in her paper work

## 2024-03-09 ENCOUNTER — Encounter: Payer: Self-pay | Admitting: Radiology

## 2024-03-09 ENCOUNTER — Telehealth: Payer: Self-pay

## 2024-03-09 DIAGNOSIS — R748 Abnormal levels of other serum enzymes: Secondary | ICD-10-CM

## 2024-03-09 DIAGNOSIS — K859 Acute pancreatitis without necrosis or infection, unspecified: Secondary | ICD-10-CM

## 2024-03-11 ENCOUNTER — Inpatient Hospital Stay: Admission: RE | Admit: 2024-03-11 | Discharge: 2024-03-11 | Attending: Primary Care | Admitting: Primary Care

## 2024-03-11 ENCOUNTER — Ambulatory Visit
Admission: RE | Admit: 2024-03-11 | Discharge: 2024-03-11 | Disposition: A | Discharge status: Home / Self Care | Source: Ambulatory Visit | Attending: Primary Care | Admitting: Primary Care

## 2024-03-11 DIAGNOSIS — N63 Unspecified lump in unspecified breast: Secondary | ICD-10-CM

## 2024-03-11 NOTE — Telephone Encounter (Signed)
   I called the patient to inform him that he will need to be rescheduled for his scan for the hospital to be able to get authorization. Patient verbalized understanding and has now been scheduled for 03/23/24 at 2:00 pm. Patient is aware.

## 2024-03-12 ENCOUNTER — Ambulatory Visit (HOSPITAL_COMMUNITY)

## 2024-03-12 ENCOUNTER — Ambulatory Visit (HOSPITAL_COMMUNITY): Admission: RE | Admit: 2024-03-12 | Source: Ambulatory Visit

## 2024-03-23 ENCOUNTER — Ambulatory Visit (HOSPITAL_COMMUNITY)

## 2024-03-27 ENCOUNTER — Other Ambulatory Visit (INDEPENDENT_AMBULATORY_CARE_PROVIDER_SITE_OTHER): Payer: Self-pay | Admitting: Primary Care

## 2024-03-27 DIAGNOSIS — K219 Gastro-esophageal reflux disease without esophagitis: Secondary | ICD-10-CM

## 2024-04-06 NOTE — Telephone Encounter (Signed)
 Created encounter in error

## 2024-04-23 ENCOUNTER — Ambulatory Visit (HOSPITAL_BASED_OUTPATIENT_CLINIC_OR_DEPARTMENT_OTHER): Admitting: Student

## 2024-04-23 DIAGNOSIS — S46001A Unspecified injury of muscle(s) and tendon(s) of the rotator cuff of right shoulder, initial encounter: Secondary | ICD-10-CM | POA: Diagnosis not present

## 2024-04-23 DIAGNOSIS — M19011 Primary osteoarthritis, right shoulder: Secondary | ICD-10-CM | POA: Diagnosis not present

## 2024-04-23 NOTE — Progress Notes (Signed)
 Chief Complaint: Right shoulder pain     History of Present Illness:   04/23/2024: Patient presents today for follow-up evaluation of his right shoulder.  He reports that the second injection performed on 9/3 has given him significant relief until recently.  He would like to avoid any more injections for the time being and would like to discuss possible physical therapy today.    [name] is a 63 y.o. male presents today with ongoing right shoulder pain after recent injury where he is throwing a trash bag into his trash bin and suddenly felt a pop with inability to lift the shoulder.  Of note he is 10 years status post previous right shoulder rotator cuff repair.  He was doing well until this occurred.  Since this time he has had very limited overhead range of motion of the shoulder.  He has been having pain with sleep with laying directly on the side.  This is his dominant side.  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    PMH/PSH/Family History/Social History/Meds/Allergies:    Past Medical History:  Diagnosis Date   Alcohol abuse    Allergy    Anxiety    Arthritis    Diverticulosis of colon (without mention of hemorrhage)    GERD (gastroesophageal reflux disease)    Gout    Headache    History of kidney stones    Hyperlipemia    Hypertension    dr garrel  norins   Kidney stones    Nephrolithiasis    Pancreatitis    Hx of    Sleep apnea    possible sleep apnea--no testing done pt states, but his PCP has mentioned testing recently but not set up yet   Substance abuse (HCC)    alcohol   Ureteral stent displacement 1990   Past Surgical History:  Procedure Laterality Date   A2 pulley flexor sheath cyst 4th finger excisional biopsy     '03   ANTERIOR CRUCIATE LIGAMENT REPAIR     R knee   ESOPHAGOGASTRODUODENOSCOPY N/A 07/09/2012   Procedure: ESOPHAGOGASTRODUODENOSCOPY (EGD);  Surgeon: Lamar JONETTA Aho, MD;  Location: [facility];  Service: Endoscopy;   Laterality: N/A;   KNEE SURGERY  2008   LEFT HEART CATH AND CORONARY ANGIOGRAPHY N/A 08/10/2019   Procedure: LEFT HEART CATH AND CORONARY ANGIOGRAPHY;  Surgeon: [name], MD;  [facility];  Service: Cardiovascular;  Laterality: N/A;   SHOULDER ARTHROSCOPY WITH SUBACROMIAL DECOMPRESSION AND OPEN ROTATOR C Right 12/26/2012   Procedure: RIGHT SHOULDER ARTHROSCOPY WITH SUBACROMIAL DECOMPRESSION MINI OPEN ROTATOR CUFF REPAIR, OPEN BICEP TENODESIS OPEN DISTAL CLAVICLE RESECTION  ;  Surgeon: Elspeth JONELLE Her, MD;  Location: [facility];  Service: Orthopedics;  Laterality: Right;   TRANSFORAMINAL LUMBAR INTERBODY FUSION (TLIF) WITH PEDICLE SCREW FIXATION 1 LEVEL Right 03/27/2018   Procedure: RIGHT-SIDED LUMBAR 4-5 TRANSFORAMINAL LUMBAR INTERBODY FUSION WITH INSTRUMENTATION AND ALLOGRAFT;  Surgeon: Beuford Anes, MD;  Location: [facility];  Service: Orthopedics;  Laterality: Right;   URETEROSCOPY     and stone retreval   Social History   Socioeconomic History   Marital status: Legally Separated    Spouse name: Not on file   Number of children: 3   Years of education: 12   Highest education level: 12th grade  Occupational History    Employer: [employer]    Comment: Pharmacologist , retired  Tobacco Use   Smoking status: Former    Current  packs/day: 0.00    Types: Cigarettes    Quit date: 07/10/1998    Years since quitting: 25.8   Smokeless tobacco: Never  Vaping Use   Vaping status: Never Used  Substance and Sexual Activity   Alcohol use: Yes    Alcohol/week: 12.0 standard drinks of alcohol    Types: 6 Cans of beer, 6 Shots of liquor per week    Comment: 1 pint last night--on Sunday night 11/10/23 (last drink) --drinks daily   Drug use: No   Sexual activity: Yes  Other Topics Concern   Not on file  Social History Narrative   HS[employer]   [employer] groundskeeper   Difficult domestic situation, Married '87- separated '01   2 sons- '82, '91; 1 daughter '91   His niece is 51 with  metastatic breast cancer (feb '12)   Social Drivers of Health   Tobacco Use: Medium Risk (03/04/2024)   Patient History    Smoking Tobacco Use: Former    Smokeless Tobacco Use: Never    Passive Exposure: Not on Actuary Strain: Not on file  Food Insecurity: No Food Insecurity (11/12/2023)   Epic    Worried About Programme Researcher, Broadcasting/film/video in the Last Year: Never true    Ran Out of Food in the Last Year: Never true  Transportation Needs: No Transportation Needs (11/12/2023)   Epic    Lack of Transportation (Medical): No    Lack of Transportation (Non-Medical): No  Physical Activity: Not on file  Stress: Not on file  Social Connections: Socially Isolated (11/12/2023)   Social Connection and Isolation Panel    Frequency of Communication with Friends and Family: More than three times a week    Frequency of Social Gatherings with Friends and Family: Once a week    Attends Religious Services: Never    Database Administrator or Organizations: No    Attends Banker Meetings: Never    Marital Status: Separated  Depression (PHQ2-9): Low Risk (03/03/2024)   Depression (PHQ2-9)    PHQ-2 Score: 0  Alcohol Screen: Not on file  Housing: Low Risk (11/12/2023)   Epic    Unable to Pay for Housing in the Last Year: No    Number of Times Moved in the Last Year: 0    Homeless in the Last Year: No  Utilities: Not At Risk (11/12/2023)   Epic    Threatened with loss of utilities: No  Health Literacy: Not on file   Family History  Problem Relation Age of Onset   Diabetes Mother        x2 brothers   Hypertension Father        X4 siblings   Heart disease Father    Colon cancer Neg Hx    Esophageal cancer Neg Hx    Allergies  Allergen Reactions   Hydrocodone -Acetaminophen  Palpitations   Shrimp [Shellfish Allergy] Other (See Comments)    Cholesterol level becomes very high. Patient reports sweating and itching.   Current Outpatient Medications  Medication Sig Dispense Refill    amLODipine  (NORVASC ) 10 MG tablet Take 1 tablet (10 mg total) by mouth daily. 90 tablet 3   atorvastatin  (LIPITOR) 40 MG tablet Take 1 tablet (40 mg total) by mouth daily. 90 tablet 3   carvedilol  (COREG ) 25 MG tablet Take 1 tablet (25 mg total) by mouth 2 (two) times daily with a meal. 180 tablet 3   losartan  (COZAAR ) 25 MG tablet Take 1 tablet (25 mg  total) by mouth daily. 90 tablet 3   Multiple Vitamins-Minerals (MULTIVITAMIN WITH MINERALS) tablet Take 1 tablet by mouth daily.     omeprazole  (PRILOSEC) 40 MG capsule TAKE 1 CAPSULE(40 MG) BY MOUTH DAILY 90 capsule 1   polyethylene glycol powder (GLYCOLAX /MIRALAX ) 17 GM/SCOOP powder Take 17 g by mouth daily. (Patient not taking: Reported on 03/04/2024) 238 g 0   polyethylene glycol powder (GLYCOLAX /MIRALAX ) 17 GM/SCOOP powder Take 17 g by mouth daily. (Patient not taking: Reported on 03/04/2024) 238 g 0   Tetrahydrozoline HCl (VISINE OP) Place 1 drop into both eyes daily as needed (redness).     traMADol  (ULTRAM ) 50 MG tablet Take 1 tablet (50 mg total) by mouth every 6 (six) hours as needed for up to 12 doses. (Patient not taking: Reported on 03/04/2024) 12 tablet 0   No current facility-administered medications for this visit.   No results found.  Review of Systems:   A ROS was performed including pertinent positives and negatives as documented in the HPI.  Physical Exam :   Constitutional: NAD and appears stated age Neurological: Alert and oriented Psych: Appropriate affect and cooperative There were no vitals taken for this visit.   Comprehensive Musculoskeletal Exam:    Tenderness in the right shoulder over the lateral deltoid.  Active forward elevation to 130 degrees compared to 160 on contralateral side.  Bilateral ER to 30 degrees and IR to L4.  Positive drop arm, Hawkins, and empty can.  Imaging:     Assessment and Plan:   63 y.o. male with history of a right shoulder rotator cuff repair who has subsequently developed  glenohumeral osteoarthritis.  He did get significant relief from a cortisone injection performed approximately 3 months ago.  Dr. Genelle had briefly discussed potential shoulder arthroplasty but patient is still hoping to avoid any surgical intervention at this time as well as repeat injections.  Patient would like to trial physical therapy which I discussed seems very reasonable in order to help maximize his strength, range of motion, and function.  Will place referral for this today and can have him follow-up as needed.    - Referral to physical therapy for right shoulder range of motion and strengthening    I personally saw and evaluated the patient, and participated in the management and treatment plan.   Leonce Reveal, PA-C Orthopedics  This document was dictated using Conservation officer, historic buildings. A reasonable attempt at proof reading has been made to minimize errors.

## 2024-05-12 ENCOUNTER — Other Ambulatory Visit: Payer: Self-pay

## 2024-05-12 ENCOUNTER — Ambulatory Visit: Attending: Student

## 2024-05-12 DIAGNOSIS — M25511 Pain in right shoulder: Secondary | ICD-10-CM | POA: Insufficient documentation

## 2024-05-12 DIAGNOSIS — S46001A Unspecified injury of muscle(s) and tendon(s) of the rotator cuff of right shoulder, initial encounter: Secondary | ICD-10-CM | POA: Insufficient documentation

## 2024-05-12 DIAGNOSIS — G8929 Other chronic pain: Secondary | ICD-10-CM | POA: Diagnosis present

## 2024-05-12 DIAGNOSIS — M6281 Muscle weakness (generalized): Secondary | ICD-10-CM | POA: Insufficient documentation

## 2024-05-12 NOTE — Therapy (Addendum)
 " OUTPATIENT PHYSICAL THERAPY SHOULDER EVALUATION   Patient Name: [name] MRN: [id number] DOB:[birth year], 64 y.o., male Today's Date: 05/12/2024  END OF SESSION:  PT End of Session - 05/12/24 1313     Visit Number 1    Number of Visits 12    Date for Recertification  07/10/24    Authorization Type [employer]    PT Start Time 1315    PT Stop Time 1400    PT Time Calculation (min) 45 min    Activity Tolerance Patient tolerated treatment well    Behavior During Therapy WFL for tasks assessed/performed          Past Medical History:  Diagnosis Date   Alcohol abuse    Allergy    Anxiety    Arthritis    Diverticulosis of colon (without mention of hemorrhage)    GERD (gastroesophageal reflux disease)    Gout    Headache    History of kidney stones    Hyperlipemia    Hypertension    dr garrel  norins   Kidney stones    Nephrolithiasis    Pancreatitis    Hx of    Sleep apnea    possible sleep apnea--no testing done pt states, but his PCP has mentioned testing recently but not set up yet   Substance abuse (HCC)    alcohol   Ureteral stent displacement 1990   Past Surgical History:  Procedure Laterality Date   A2 pulley flexor sheath cyst 4th finger excisional biopsy     '03   ANTERIOR CRUCIATE LIGAMENT REPAIR     R knee   ESOPHAGOGASTRODUODENOSCOPY N/A 07/09/2012   Procedure: ESOPHAGOGASTRODUODENOSCOPY (EGD);  Surgeon: Lamar JONETTA Aho, MD;  Location: [facility];  Service: Endoscopy;  Laterality: N/A;   KNEE SURGERY  2008   LEFT HEART CATH AND CORONARY ANGIOGRAPHY N/A 08/10/2019   Procedure: LEFT HEART CATH AND CORONARY ANGIOGRAPHY;  Surgeon: [name], MD;  Location: [facility];  Service: Cardiovascular;  Laterality: N/A;   SHOULDER ARTHROSCOPY WITH SUBACROMIAL DECOMPRESSION AND OPEN ROTATOR C Right 12/26/2012   Procedure: RIGHT SHOULDER ARTHROSCOPY WITH SUBACROMIAL DECOMPRESSION MINI OPEN ROTATOR CUFF REPAIR, OPEN BICEP TENODESIS OPEN DISTAL CLAVICLE  RESECTION  ;  Surgeon: Elspeth JONELLE Her, MD;  Location: [facility];  Service: Orthopedics;  Laterality: Right;   TRANSFORAMINAL LUMBAR INTERBODY FUSION (TLIF) WITH PEDICLE SCREW FIXATION 1 LEVEL Right 03/27/2018   Procedure: RIGHT-SIDED LUMBAR 4-5 TRANSFORAMINAL LUMBAR INTERBODY FUSION WITH INSTRUMENTATION AND ALLOGRAFT;  Surgeon: Beuford Anes, MD;  Location: [facility];  Service: Orthopedics;  Laterality: Right;   URETEROSCOPY     and stone retreval   Patient Active Problem List   Diagnosis Date Noted   Ileus (HCC) 11/15/2023   Upper abdominal pain 11/13/2023   Morbid obesity (HCC) 11/05/2023   Chest pain of uncertain etiology 07/24/2019   Alcoholic pancreatitis 05/20/2018   Normal anion gap metabolic acidosis 05/20/2018   Radiculopathy 03/27/2018   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/02/2017   Renal stone 10/08/2016   Non-compliant behavior 12/02/2013   Melena 07/09/2012   Routine health maintenance 01/27/2012   Hyperlipidemia 01/27/2012   Knee pain, left 11/12/2011   BPH (benign prostatic hyperplasia) 11/12/2011   GERD 08/10/2008   Essential hypertension 04/14/2008   Alcohol abuse 10/26/2007   TRIGGER FINGER, LEFT MIDDLE 05/12/2007   NEPHROLITHIASIS, HX OF 05/12/2007    PCP: [name], NP   REFERRING PROVIDER: Emiliano Leonce CROME, PA-C  REFERRING DIAG: [id number] (ICD-10-CM) -  Injury of right rotator cuff, initial encounter  THERAPY DIAG:  Injury of right rotator cuff, initial encounter  Chronic right shoulder pain  Muscle weakness (generalized)  Rationale for Evaluation and Treatment: Rehabilitation  ONSET DATE: 9/3  SUBJECTIVE:                                                                                                                                                                                      SUBJECTIVE STATEMENT: Patient presents today for pain of his right shoulder.  He reports that the second injection performed on 9/3 has given him significant  relief until recently.  He would like to avoid any more injections for the time being and would like to discuss possible physical therapy today.       [name] is a 64 y.o. male presents today with ongoing right shoulder pain after recent injury where he is throwing a trash bag into his trash bin and suddenly felt a pop with inability to lift the shoulder.  Of note he is 10 years status post previous right shoulder rotator cuff repair.  He was doing well until this occurred.  Since this time he has had very limited overhead range of motion of the shoulder.  He has been having pain with sleep with laying directly on the side.  This is his dominant side.  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 Hand dominance: Right  PERTINENT HISTORY: 64 y.o. male with history of a right shoulder rotator cuff repair who has subsequently developed glenohumeral osteoarthritis.  He did get significant relief from a cortisone injection performed approximately 3 months ago.  Dr. Genelle had briefly discussed potential shoulder arthroplasty but patient is still hoping to avoid any surgical intervention at this time as well as repeat injections.  Patient would like to trial physical therapy which I discussed seems very reasonable in order to help maximize his strength, range of motion, and function.  Will place referral for this today and can have him follow-up as needed.  PAIN:  Are you having pain? Yes: NPRS scale: 7/10 Pain location: R shoulder  Pain description: ache Aggravating factors: activity primarily OH and BH, lying on R side Relieving factors: rest, prior injections  PRECAUTIONS: None  RED FLAGS: None   WEIGHT BEARING RESTRICTIONS: No  FALLS:  Has patient fallen in last 6 months? No  OCCUPATION: caregiver  PLOF: Independent  PATIENT GOALS:To manage my shoulder pain  NEXT MD VISIT: TBD  OBJECTIVE:  Note: Objective measures were completed at Evaluation unless otherwise  noted.  DIAGNOSTIC FINDINGS:  IMPRESSION: 1. Current study is moderately motion degraded. 2. Postsurgical changes consistent with interval  rotator cuff repair and distal clavicle resection. 3. The supraspinatus tendon is diffusely attenuated and irregular, consistent with recurrent at least partial thickness tearing. No significant tendon retraction or muscular atrophy. 4. The intra-articular portion of the biceps tendon is not visualized, possibly secondary to interval biceps tenotomy or tenodesis. 5. Diffuse irregularity and attenuation of the superior labrum which may relate to interval labral debridement or a tear. 6. Edema anteriorly and laterally in the deltoid muscle. 7. Moderate amount of complex fluid anteriorly in the subacromial-subdeltoid space.     Electronically Signed   By: Elsie Perone M.D.   On: 06/11/2023 12:16  PATIENT SURVEYS:  Quick Dash: 29/55 QUICK DASH    Minimally Clinically Important Difference (MCID): 15-20 points  (Franchignoni, F. et al. (2013). Minimally clinically important difference of the disabilities of the arm, shoulder, and hand outcome measures (DASH) and its shortened version (Quick DASH). Journal of Orthopaedic & Sports Physical Therapy, 44(1), 30-39)    POSTURE: Rounded and forward shoulder  UPPER EXTREMITY ROM:   A/PROM Right eval Left eval  Shoulder flexion 110/WNLd   Shoulder extension    Shoulder abduction 90/WNL   Shoulder adduction    Shoulder internal rotation GT/WNL   Shoulder external rotation /WNL   Elbow flexion    Elbow extension    Wrist flexion    Wrist extension    Wrist ulnar deviation    Wrist radial deviation    Wrist pronation    Wrist supination    (Blank rows = not tested)  UPPER EXTREMITY MMT:  MMT Right eval Left eval  Shoulder flexion 3+   Shoulder extension 3+   Shoulder abduction 3+   Shoulder adduction    Shoulder internal rotation 4-   Shoulder external rotation 4-   Middle  trapezius    Lower trapezius    Elbow flexion    Elbow extension    Wrist flexion    Wrist extension    Wrist ulnar deviation    Wrist radial deviation    Wrist pronation    Wrist supination    Grip strength (lbs)    (Blank rows = not tested)  SHOULDER SPECIAL TESTS: Impingement tests: Neer impingement test: positive  and Hawkins/Kennedy impingement test: positive  Rotator cuff assessment: Empty can test: negative and Hornblower's sign: negative   PALPATION:  TTP posterior scapular muscles                                                                                                                             TREATMENT:  [facility] Adult PT Treatment:                                                DATE: 05/12/24 Eval and HEP Self Care: Additional minutes spent for educating on updated Therapeutic Home Exercise Program  as well as comparing current status to condition at start of symptoms. This included exercises focusing on stretching, strengthening, with focus on eccentric aspects. Long term goals include an improvement in range of motion, strength, endurance as well as avoiding reinjury. Patient's frequency would include in 1-2 times a day, 3-5 times a week for a duration of 6-12 weeks. Proper technique shown and discussed handout in great detail. All questions were discussed and addressed.     PATIENT EDUCATION: Education details: Discussed eval findings, rehab rationale and POC and patient is in agreement  Person educated: Patient Education method: Explanation and Handouts Education comprehension: verbalized understanding and needs further education  HOME EXERCISE PROGRAM: Access Code: [email or web address] URL: [email or web address] Date: 05/12/2024 Prepared by: Kevork Joyce  Exercises - Standing Shoulder Horizontal Abduction with Resistance  - 2-3 x daily - 5 x weekly - 1 sets - 15 reps - Shoulder External Rotation and Scapular Retraction with Resistance  - 2-3 x daily - 5  x weekly - 1 sets - 15 reps - Standing Shoulder Scaption  - 2-3 x daily - 5 x weekly - 1 sets - 15 reps  ASSESSMENT:  CLINICAL IMPRESSION: Patient is a 64 y.o. male who was seen today for physical therapy evaluation and treatment for chronic R shoulder pain.  Patient presents with limited AROM due to pain, positive impingement signs and full PROM noted.  Forward head and protracted and rounded shoulder posture observed.  TTP noted to posterior scapular muscles especially infraspinatus and teres group.  Isolated RC testing unremarkable for tear.  Patient presents with soft tissue irritation die to postural and biomechanical shoulder deficits.     OBJECTIVE IMPAIRMENTS: decreased coordination, decreased endurance, decreased knowledge of condition, decreased ROM, decreased strength, impaired UE functional use, improper body mechanics, postural dysfunction, and pain.   ACTIVITY LIMITATIONS: carrying, lifting, sleeping, and reach over head  PERSONAL FACTORS: Age, Fitness, Past/current experiences, and Time since onset of injury/illness/exacerbation are also affecting patient's functional outcome.   REHAB POTENTIAL: Good  CLINICAL DECISION MAKING: Stable/uncomplicated  EVALUATION COMPLEXITY: Moderate   GOALS: Goals reviewed with patient? No  SHORT TERM GOALS: Target date: 06/09/2024    Patient to demonstrate independence in HEP  Baseline: [id number] Goal status: INITIAL  2.  Patient to reach [facility] and behind with minimal discomfort Baseline: marked discomfort and limited ROM Goal status: INITIAL    LONG TERM GOALS: Target date: 07/07/2024    Patient will score at least 17/55 on FOTO to signify clinically meaningful improvement in functional abilities.   Baseline: 29/55 Goal status: INITIAL  2.  Patient will acknowledge 4/10 pain at least once during episode of care  Baseline: 7/10 Goal status: INITIAL  3.  Patient to demo AROM 160d flexion and abduction R shoulder Baseline:  A/PROM  Right eval Left eval  Shoulder flexion 110/WNLd   Shoulder extension    Shoulder abduction 90/WNL   Shoulder adduction    Shoulder internal rotation GT/WNL   Shoulder external rotation /WNL    Goal status: INITIAL  4.  Patient to demonstrate 4/5 R shoulder and scapulothoracic strength  Baseline:  MMT Right eval Left eval  Shoulder flexion 3+   Shoulder extension 3+   Shoulder abduction 3+   Shoulder adduction    Shoulder internal rotation 4-   Shoulder external rotation 4-    Goal status: INITIAL   PLAN:  PT FREQUENCY: 2x/week  PT DURATION: 6 weeks  PLANNED INTERVENTIONS: 97110-Therapeutic exercises, 97530- Therapeutic activity, [id number]- Neuromuscular re-education, 97535- Self  Care, [id number]- Manual therapy, and Patient/Family education  PLAN FOR NEXT SESSION: HEP review and update, manual techniques as appropriate, aerobic tasks, ROM and flexibility activities, strengthening and PREs, TPDN, gait and balance training,aquatic therapy, modalities for pain and NMRE     For all possible CPT codes, reference the Planned Interventions line above.     Check all conditions that are expected to impact treatment: {Conditions expected to impact treatment:None of these apply   If treatment provided at initial evaluation, no treatment charged due to lack of authorization.       Zaharah Amir M Javanni Maring, PT 05/12/2024, 1:16 PM  "

## 2024-05-21 ENCOUNTER — Ambulatory Visit

## 2024-05-21 DIAGNOSIS — M6281 Muscle weakness (generalized): Secondary | ICD-10-CM

## 2024-05-21 DIAGNOSIS — S46001A Unspecified injury of muscle(s) and tendon(s) of the rotator cuff of right shoulder, initial encounter: Secondary | ICD-10-CM

## 2024-05-21 DIAGNOSIS — G8929 Other chronic pain: Secondary | ICD-10-CM

## 2024-05-21 NOTE — Therapy (Signed)
 " OUTPATIENT PHYSICAL THERAPY TREATMENT NOTE    Patient Name: [name] MRN: [id number] DOB:[birth year], 64 y.o., male Today's Date: 05/25/2024  END OF SESSION:  PT End of Session - 05/25/24 1405     Visit Number 3    Number of Visits 12    Date for Recertification  07/10/24    Authorization Type [employer]    PT Start Time 1400    PT Stop Time 1440    PT Time Calculation (min) 40 min    Activity Tolerance Patient tolerated treatment well    Behavior During Therapy WFL for tasks assessed/performed            Past Medical History:  Diagnosis Date   Alcohol abuse    Allergy    Anxiety    Arthritis    Diverticulosis of colon (without mention of hemorrhage)    GERD (gastroesophageal reflux disease)    Gout    Headache    History of kidney stones    Hyperlipemia    Hypertension    dr garrel  norins   Kidney stones    Nephrolithiasis    Pancreatitis    Hx of    Sleep apnea    possible sleep apnea--no testing done pt states, but his PCP has mentioned testing recently but not set up yet   Substance abuse (HCC)    alcohol   Ureteral stent displacement 1990   Past Surgical History:  Procedure Laterality Date   A2 pulley flexor sheath cyst 4th finger excisional biopsy     '03   ANTERIOR CRUCIATE LIGAMENT REPAIR     R knee   ESOPHAGOGASTRODUODENOSCOPY N/A 07/09/2012   Procedure: ESOPHAGOGASTRODUODENOSCOPY (EGD);  Surgeon: Lamar JONETTA Aho, MD;  Location: [facility];  Service: Endoscopy;  Laterality: N/A;   KNEE SURGERY  2008   LEFT HEART CATH AND CORONARY ANGIOGRAPHY N/A 08/10/2019   Procedure: LEFT HEART CATH AND CORONARY ANGIOGRAPHY;  Surgeon: [name], MD;  Location: [facility];  Service: Cardiovascular;  Laterality: N/A;   SHOULDER ARTHROSCOPY WITH SUBACROMIAL DECOMPRESSION AND OPEN ROTATOR C Right 12/26/2012   Procedure: RIGHT SHOULDER ARTHROSCOPY WITH SUBACROMIAL DECOMPRESSION MINI OPEN ROTATOR CUFF REPAIR, OPEN BICEP TENODESIS OPEN DISTAL CLAVICLE  RESECTION  ;  Surgeon: Elspeth JONELLE Her, MD;  Location: [facility];  Service: Orthopedics;  Laterality: Right;   TRANSFORAMINAL LUMBAR INTERBODY FUSION (TLIF) WITH PEDICLE SCREW FIXATION 1 LEVEL Right 03/27/2018   Procedure: RIGHT-SIDED LUMBAR 4-5 TRANSFORAMINAL LUMBAR INTERBODY FUSION WITH INSTRUMENTATION AND ALLOGRAFT;  Surgeon: Beuford Anes, MD;  Location: [facility];  Service: Orthopedics;  Laterality: Right;   URETEROSCOPY     and stone retreval   Patient Active Problem List   Diagnosis Date Noted   Ileus (HCC) 11/15/2023   Upper abdominal pain 11/13/2023   Morbid obesity (HCC) 11/05/2023   Chest pain of uncertain etiology 07/24/2019   Alcoholic pancreatitis 05/20/2018   Normal anion gap metabolic acidosis 05/20/2018   Radiculopathy 03/27/2018   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/02/2017   Renal stone 10/08/2016   Non-compliant behavior 12/02/2013   Melena 07/09/2012   Routine health maintenance 01/27/2012   Hyperlipidemia 01/27/2012   Knee pain, left 11/12/2011   BPH (benign prostatic hyperplasia) 11/12/2011   GERD 08/10/2008   Essential hypertension 04/14/2008   Alcohol abuse 10/26/2007   TRIGGER FINGER, LEFT MIDDLE 05/12/2007   NEPHROLITHIASIS, HX OF 05/12/2007    PCP: [name], NP   REFERRING PROVIDER: Emiliano Leonce CROME, PA-C  REFERRING DIAG:  S46.001A (ICD-10-CM) - Injury of right rotator cuff, initial encounter  THERAPY DIAG:  Injury of right rotator cuff, initial encounter  Chronic right shoulder pain  Muscle weakness (generalized)  Rationale for Evaluation and Treatment: Rehabilitation  ONSET DATE: 9/3  SUBJECTIVE:                                                                                                                                                                                      SUBJECTIVE STATEMENT:  Cold weather affects shoulder but feels symptoms are moving in the right direction.  EVAL: Patient presents today for pain of his  right shoulder.  He reports that the second injection performed on 9/3 has given him significant relief until recently.  He would like to avoid any more injections for the time being and would like to discuss possible physical therapy today.      [name] is a 64 y.o. male presents today with ongoing right shoulder pain after recent injury where he is throwing a trash bag into his trash bin and suddenly felt a pop with inability to lift the shoulder.  Of note he is 10 years status post previous right shoulder rotator cuff repair.  He was doing well until this occurred.  Since this time he has had very limited overhead range of motion of the shoulder.  He has been having pain with sleep with laying directly on the side.  This is his dominant side.  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 Hand dominance: Right  PERTINENT HISTORY: 64 y.o. male with history of a right shoulder rotator cuff repair who has subsequently developed glenohumeral osteoarthritis.  He did get significant relief from a cortisone injection performed approximately 3 months ago.  Dr. Genelle had briefly discussed potential shoulder arthroplasty but patient is still hoping to avoid any surgical intervention at this time as well as repeat injections.  Patient would like to trial physical therapy which I discussed seems very reasonable in order to help maximize his strength, range of motion, and function.  Will place referral for this today and can have him follow-up as needed.  PAIN:  Are you having pain? Yes: NPRS scale: 7/10 Pain location: R shoulder  Pain description: ache Aggravating factors: activity primarily OH and BH, lying on R side Relieving factors: rest, prior injections  PRECAUTIONS: None  RED FLAGS: None   WEIGHT BEARING RESTRICTIONS: No  FALLS:  Has patient fallen in last 6 months? No  OCCUPATION: caregiver  PLOF: Independent  PATIENT GOALS:To manage my shoulder pain  NEXT MD VISIT:  TBD  OBJECTIVE:  Note: Objective measures were completed at Evaluation unless otherwise noted.  DIAGNOSTIC FINDINGS:  IMPRESSION: 1. Current study is moderately motion degraded. 2. Postsurgical changes consistent with interval rotator cuff repair and distal clavicle resection. 3. The supraspinatus tendon is diffusely attenuated and irregular, consistent with recurrent at least partial thickness tearing. No significant tendon retraction or muscular atrophy. 4. The intra-articular portion of the biceps tendon is not visualized, possibly secondary to interval biceps tenotomy or tenodesis. 5. Diffuse irregularity and attenuation of the superior labrum which may relate to interval labral debridement or a tear. 6. Edema anteriorly and laterally in the deltoid muscle. 7. Moderate amount of complex fluid anteriorly in the subacromial-subdeltoid space.     Electronically Signed   By: Elsie Perone M.D.   On: 06/11/2023 12:16  PATIENT SURVEYS:  Quick Dash: 29/55 QUICK DASH    Minimally Clinically Important Difference (MCID): 15-20 points  (Franchignoni, F. et al. (2013). Minimally clinically important difference of the disabilities of the arm, shoulder, and hand outcome measures (DASH) and its shortened version (Quick DASH). Journal of Orthopaedic & Sports Physical Therapy, 44(1), 30-39)    POSTURE: Rounded and forward shoulder  UPPER EXTREMITY ROM:   A/PROM Right eval Left eval  Shoulder flexion 110/WNLd   Shoulder extension    Shoulder abduction 90/WNL   Shoulder adduction    Shoulder internal rotation GT/WNL   Shoulder external rotation /WNL   Elbow flexion    Elbow extension    Wrist flexion    Wrist extension    Wrist ulnar deviation    Wrist radial deviation    Wrist pronation    Wrist supination    (Blank rows = not tested)  UPPER EXTREMITY MMT:  MMT Right eval Left eval  Shoulder flexion 3+   Shoulder extension 3+   Shoulder abduction 3+    Shoulder adduction    Shoulder internal rotation 4-   Shoulder external rotation 4-   Middle trapezius    Lower trapezius    Elbow flexion    Elbow extension    Wrist flexion    Wrist extension    Wrist ulnar deviation    Wrist radial deviation    Wrist pronation    Wrist supination    Grip strength (lbs)    (Blank rows = not tested)  SHOULDER SPECIAL TESTS: Impingement tests: Neer impingement test: positive  and Hawkins/Kennedy impingement test: positive  Rotator cuff assessment: Empty can test: negative and Hornblower's sign: negative   PALPATION:  TTP posterior scapular muscles                                                                                                                             TREATMENT:  [facility] Adult PT Treatment:                                                DATE:  05/25/24 Therapeutic Exercise: Nustep L2 8 min  Therapeutic Activity: Supine press 500g 15x Supine flexion 500g 15x L S/L flexion 500g 15x Prone flex/ext/scap/row/hor abd 500g ball 15x Seated hor abd YTB 15x Seated scaption 500g ball 15x Doorway stretch 30s x3 R pec minor release 2 min   [facility] Adult PT Treatment:                                                DATE: 05/21/24 Therapeutic Exercise: Pulleys flex/scap x2' ea Supine dowel flexion AAROM 2x10 Supine ER with dowel 2x10 Supine flexion AROM RUE 2x10 Supine chest press AROM RUE 2x10 Seated ITY's no weight x10 ea Neuromuscular re-ed: Supine horizontal abduction YTB 2x10 Supine BIL ER YTB 2x10   [facility] Adult PT Treatment:                                                DATE: 05/12/24 Eval and HEP Self Care: Additional minutes spent for educating on updated Therapeutic Home Exercise Program as well as comparing current status to condition at start of symptoms. This included exercises focusing on stretching, strengthening, with focus on eccentric aspects. Long term goals include an improvement in range of motion, strength,  endurance as well as avoiding reinjury. Patient's frequency would include in 1-2 times a day, 3-5 times a week for a duration of 6-12 weeks. Proper technique shown and discussed handout in great detail. All questions were discussed and addressed.     PATIENT EDUCATION: Education details: Discussed eval findings, rehab rationale and POC and patient is in agreement  Person educated: Patient Education method: Explanation and Handouts Education comprehension: verbalized understanding and needs further education  HOME EXERCISE PROGRAM: Access Code: [email or web address] URL: [email or web address] Date: 05/12/2024 Prepared by: Arthurine Oleary  Exercises - Standing Shoulder Horizontal Abduction with Resistance  - 2-3 x daily - 5 x weekly - 1 sets - 15 reps - Shoulder External Rotation and Scapular Retraction with Resistance  - 2-3 x daily - 5 x weekly - 1 sets - 15 reps - Standing Shoulder Scaption  - 2-3 x daily - 5 x weekly - 1 sets - 15 reps  ASSESSMENT:  CLINICAL IMPRESSION: Focus of session was aerobic w/u followed by posterior shoulder strengthening.  Tasks performed in various positions to isolate rotator cuff muscles and provide posterior strength/stabilization.  Incorporated R pec minor release which decreased symptoms with AROM  EVAL: Patient is a 64 y.o. male who was seen today for physical therapy evaluation and treatment for chronic R shoulder pain.  Patient presents with limited AROM due to pain, positive impingement signs and full PROM noted.  Forward head and protracted and rounded shoulder posture observed.  TTP noted to posterior scapular muscles especially infraspinatus and teres group.  Isolated RC testing unremarkable for tear.  Patient presents with soft tissue irritation die to postural and biomechanical shoulder deficits.     OBJECTIVE IMPAIRMENTS: decreased coordination, decreased endurance, decreased knowledge of condition, decreased ROM, decreased strength, impaired UE  functional use, improper body mechanics, postural dysfunction, and pain.   ACTIVITY LIMITATIONS: carrying, lifting, sleeping, and reach over head  PERSONAL FACTORS: Age, Fitness, Past/current experiences, and Time since onset of injury/illness/exacerbation are also affecting patient's functional outcome.  REHAB POTENTIAL: Good  CLINICAL DECISION MAKING: Stable/uncomplicated  EVALUATION COMPLEXITY: Moderate   GOALS: Goals reviewed with patient? No  SHORT TERM GOALS: Target date: 06/09/2024    Patient to demonstrate independence in HEP  Baseline: [email or web address] Goal status: INITIAL  2.  Patient to reach [facility] and behind with minimal discomfort Baseline: marked discomfort and limited ROM Goal status: INITIAL    LONG TERM GOALS: Target date: 07/07/2024    Patient will score at least 17/55 on FOTO to signify clinically meaningful improvement in functional abilities.   Baseline: 29/55 Goal status: INITIAL  2.  Patient will acknowledge 4/10 pain at least once during episode of care  Baseline: 7/10 Goal status: INITIAL  3.  Patient to demo AROM 160d flexion and abduction R shoulder Baseline:  A/PROM Right eval Left eval  Shoulder flexion 110/WNLd   Shoulder extension    Shoulder abduction 90/WNL   Shoulder adduction    Shoulder internal rotation GT/WNL   Shoulder external rotation /WNL    Goal status: INITIAL  4.  Patient to demonstrate 4/5 R shoulder and scapulothoracic strength  Baseline:  MMT Right eval Left eval  Shoulder flexion 3+   Shoulder extension 3+   Shoulder abduction 3+   Shoulder adduction    Shoulder internal rotation 4-   Shoulder external rotation 4-    Goal status: INITIAL   PLAN:  PT FREQUENCY: 2x/week  PT DURATION: 6 weeks  PLANNED INTERVENTIONS: 97110-Therapeutic exercises, 97530- Therapeutic activity, 97112- Neuromuscular re-education, 97535- Self Care, [id number]- Manual therapy, and Patient/Family education  PLAN FOR NEXT SESSION: HEP  review and update, manual techniques as appropriate, aerobic tasks, ROM and flexibility activities, strengthening and PREs, TPDN, gait and balance training,aquatic therapy, modalities for pain and NMRE     For all possible CPT codes, reference the Planned Interventions line above.     Check all conditions that are expected to impact treatment: {Conditions expected to impact treatment:None of these apply   If treatment provided at initial evaluation, no treatment charged due to lack of authorization.       Bexlee Bergdoll M Alba Perillo, PT 05/25/2024, 2:44 PM   "

## 2024-05-21 NOTE — Therapy (Signed)
 " OUTPATIENT PHYSICAL THERAPY TREATMENT NOTE    Patient Name: [name] MRN: [id number] DOB:Oct 24, 1960, 64 y.o., male Today's Date: 05/21/2024  END OF SESSION:  PT End of Session - 05/21/24 1449     Visit Number 2    Number of Visits 12    Date for Recertification  07/10/24    Authorization Type [employer]    PT Start Time 1449    PT Stop Time 1527    PT Time Calculation (min) 38 min    Activity Tolerance Patient tolerated treatment well    Behavior During Therapy WFL for tasks assessed/performed           Past Medical History:  Diagnosis Date   Alcohol abuse    Allergy    Anxiety    Arthritis    Diverticulosis of colon (without mention of hemorrhage)    GERD (gastroesophageal reflux disease)    Gout    Headache    History of kidney stones    Hyperlipemia    Hypertension    dr garrel  norins   Kidney stones    Nephrolithiasis    Pancreatitis    Hx of    Sleep apnea    possible sleep apnea--no testing done pt states, but his PCP has mentioned testing recently but not set up yet   Substance abuse (HCC)    alcohol   Ureteral stent displacement 1990   Past Surgical History:  Procedure Laterality Date   A2 pulley flexor sheath cyst 4th finger excisional biopsy     '03   ANTERIOR CRUCIATE LIGAMENT REPAIR     R knee   ESOPHAGOGASTRODUODENOSCOPY N/A 07/09/2012   Procedure: ESOPHAGOGASTRODUODENOSCOPY (EGD);  Surgeon: Lamar JONETTA Aho, MD;  Location: [facility];  Service: Endoscopy;  Laterality: N/A;   KNEE SURGERY  2008   LEFT HEART CATH AND CORONARY ANGIOGRAPHY N/A 08/10/2019   Procedure: LEFT HEART CATH AND CORONARY ANGIOGRAPHY;  Surgeon: [name], MD;  Location: [facility];  Service: Cardiovascular;  Laterality: N/A;   SHOULDER ARTHROSCOPY WITH SUBACROMIAL DECOMPRESSION AND OPEN ROTATOR C Right 12/26/2012   Procedure: RIGHT SHOULDER ARTHROSCOPY WITH SUBACROMIAL DECOMPRESSION MINI OPEN ROTATOR CUFF REPAIR, OPEN BICEP TENODESIS OPEN DISTAL CLAVICLE RESECTION   ;  Surgeon: Elspeth JONELLE Her, MD;  Location: [facility];  Service: Orthopedics;  Laterality: Right;   TRANSFORAMINAL LUMBAR INTERBODY FUSION (TLIF) WITH PEDICLE SCREW FIXATION 1 LEVEL Right 03/27/2018   Procedure: RIGHT-SIDED LUMBAR 4-5 TRANSFORAMINAL LUMBAR INTERBODY FUSION WITH INSTRUMENTATION AND ALLOGRAFT;  Surgeon: Beuford Anes, MD;  Location: [facility];  Service: Orthopedics;  Laterality: Right;   URETEROSCOPY     and stone retreval   Patient Active Problem List   Diagnosis Date Noted   Ileus (HCC) 11/15/2023   Upper abdominal pain 11/13/2023   Morbid obesity (HCC) 11/05/2023   Chest pain of uncertain etiology 07/24/2019   Alcoholic pancreatitis 05/20/2018   Normal anion gap metabolic acidosis 05/20/2018   Radiculopathy 03/27/2018   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/02/2017   Renal stone 10/08/2016   Non-compliant behavior 12/02/2013   Melena 07/09/2012   Routine health maintenance 01/27/2012   Hyperlipidemia 01/27/2012   Knee pain, left 11/12/2011   BPH (benign prostatic hyperplasia) 11/12/2011   GERD 08/10/2008   Essential hypertension 04/14/2008   Alcohol abuse 10/26/2007   TRIGGER FINGER, LEFT MIDDLE 05/12/2007   NEPHROLITHIASIS, HX OF 05/12/2007    PCP: [name], NP   REFERRING PROVIDER: Emiliano Leonce CROME, PA-C  REFERRING DIAG: [phone] (  ICD-10-CM) - Injury of right rotator cuff, initial encounter  THERAPY DIAG:  Injury of right rotator cuff, initial encounter  Chronic right shoulder pain  Muscle weakness (generalized)  Rationale for Evaluation and Treatment: Rehabilitation  ONSET DATE: 9/3  SUBJECTIVE:                                                                                                                                                                                      SUBJECTIVE STATEMENT: Patient reports that he has soreness and pain in his shoulder, has been compliant with his HEP.   EVAL: Patient presents today for pain of his  right shoulder.  He reports that the second injection performed on 9/3 has given him significant relief until recently.  He would like to avoid any more injections for the time being and would like to discuss possible physical therapy today.      [name] is a 64 y.o. male presents today with ongoing right shoulder pain after recent injury where he is throwing a trash bag into his trash bin and suddenly felt a pop with inability to lift the shoulder.  Of note he is 10 years status post previous right shoulder rotator cuff repair.  He was doing well until this occurred.  Since this time he has had very limited overhead range of motion of the shoulder.  He has been having pain with sleep with laying directly on the side.  This is his dominant side.  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 Hand dominance: Right  PERTINENT HISTORY: 64 y.o. male with history of a right shoulder rotator cuff repair who has subsequently developed glenohumeral osteoarthritis.  He did get significant relief from a cortisone injection performed approximately 3 months ago.  Dr. Genelle had briefly discussed potential shoulder arthroplasty but patient is still hoping to avoid any surgical intervention at this time as well as repeat injections.  Patient would like to trial physical therapy which I discussed seems very reasonable in order to help maximize his strength, range of motion, and function.  Will place referral for this today and can have him follow-up as needed.  PAIN:  Are you having pain? Yes: NPRS scale: 7/10 Pain location: R shoulder  Pain description: ache Aggravating factors: activity primarily OH and BH, lying on R side Relieving factors: rest, prior injections  PRECAUTIONS: None  RED FLAGS: None   WEIGHT BEARING RESTRICTIONS: No  FALLS:  Has patient fallen in last 6 months? No  OCCUPATION: caregiver  PLOF: Independent  PATIENT GOALS:To manage my shoulder pain  NEXT MD VISIT:  TBD  OBJECTIVE:  Note: Objective measures were completed at Evaluation  unless otherwise noted.  DIAGNOSTIC FINDINGS:  IMPRESSION: 1. Current study is moderately motion degraded. 2. Postsurgical changes consistent with interval rotator cuff repair and distal clavicle resection. 3. The supraspinatus tendon is diffusely attenuated and irregular, consistent with recurrent at least partial thickness tearing. No significant tendon retraction or muscular atrophy. 4. The intra-articular portion of the biceps tendon is not visualized, possibly secondary to interval biceps tenotomy or tenodesis. 5. Diffuse irregularity and attenuation of the superior labrum which may relate to interval labral debridement or a tear. 6. Edema anteriorly and laterally in the deltoid muscle. 7. Moderate amount of complex fluid anteriorly in the subacromial-subdeltoid space.     Electronically Signed   By: Elsie Perone M.D.   On: 06/11/2023 12:16  PATIENT SURVEYS:  Quick Dash: 29/55 QUICK DASH    Minimally Clinically Important Difference (MCID): 15-20 points  (Franchignoni, F. et al. (2013). Minimally clinically important difference of the disabilities of the arm, shoulder, and hand outcome measures (DASH) and its shortened version (Quick DASH). Journal of Orthopaedic & Sports Physical Therapy, 44(1), 30-39)    POSTURE: Rounded and forward shoulder  UPPER EXTREMITY ROM:   A/PROM Right eval Left eval  Shoulder flexion 110/WNLd   Shoulder extension    Shoulder abduction 90/WNL   Shoulder adduction    Shoulder internal rotation GT/WNL   Shoulder external rotation /WNL   Elbow flexion    Elbow extension    Wrist flexion    Wrist extension    Wrist ulnar deviation    Wrist radial deviation    Wrist pronation    Wrist supination    (Blank rows = not tested)  UPPER EXTREMITY MMT:  MMT Right eval Left eval  Shoulder flexion 3+   Shoulder extension 3+   Shoulder abduction 3+    Shoulder adduction    Shoulder internal rotation 4-   Shoulder external rotation 4-   Middle trapezius    Lower trapezius    Elbow flexion    Elbow extension    Wrist flexion    Wrist extension    Wrist ulnar deviation    Wrist radial deviation    Wrist pronation    Wrist supination    Grip strength (lbs)    (Blank rows = not tested)  SHOULDER SPECIAL TESTS: Impingement tests: Neer impingement test: positive  and Hawkins/Kennedy impingement test: positive  Rotator cuff assessment: Empty can test: negative and Hornblower's sign: negative   PALPATION:  TTP posterior scapular muscles                                                                                                                             TREATMENT:  [facility] Adult PT Treatment:  DATE: 05/21/24 Therapeutic Exercise: Pulleys flex/scap x2' ea Supine dowel flexion AAROM 2x10 Supine ER with dowel 2x10 Supine flexion AROM RUE 2x10 Supine chest press AROM RUE 2x10 Seated ITY's no weight x10 ea Neuromuscular re-ed: Supine horizontal abduction YTB 2x10 Supine BIL ER YTB 2x10   [facility] Adult PT Treatment:                                                DATE: 05/12/24 Eval and HEP Self Care: Additional minutes spent for educating on updated Therapeutic Home Exercise Program as well as comparing current status to condition at start of symptoms. This included exercises focusing on stretching, strengthening, with focus on eccentric aspects. Long term goals include an improvement in range of motion, strength, endurance as well as avoiding reinjury. Patient's frequency would include in 1-2 times a day, 3-5 times a week for a duration of 6-12 weeks. Proper technique shown and discussed handout in great detail. All questions were discussed and addressed.     PATIENT EDUCATION: Education details: Discussed eval findings, rehab rationale and POC and patient is in agreement  Person  educated: Patient Education method: Explanation and Handouts Education comprehension: verbalized understanding and needs further education  HOME EXERCISE PROGRAM: Access Code: [email or web address] URL: [email or web address] Date: 05/12/2024 Prepared by: Jeffrey Ziemba  Exercises - Standing Shoulder Horizontal Abduction with Resistance  - 2-3 x daily - 5 x weekly - 1 sets - 15 reps - Shoulder External Rotation and Scapular Retraction with Resistance  - 2-3 x daily - 5 x weekly - 1 sets - 15 reps - Standing Shoulder Scaption  - 2-3 x daily - 5 x weekly - 1 sets - 15 reps  ASSESSMENT:  CLINICAL IMPRESSION: Patient presents to first follow up session reporting continued right shoulder pain and soreness from completing his HEP every other day. Session today focused on ROM and strengthening with resistance bands. He reports minimal increase in pain during session, mostly endorses muscular fatigue. Will assess response to initial strengthening at next session. Patient continues to benefit from skilled PT services and should be progressed as able to improve functional independence.   EVAL: Patient is a 64 y.o. male who was seen today for physical therapy evaluation and treatment for chronic R shoulder pain.  Patient presents with limited AROM due to pain, positive impingement signs and full PROM noted.  Forward head and protracted and rounded shoulder posture observed.  TTP noted to posterior scapular muscles especially infraspinatus and teres group.  Isolated RC testing unremarkable for tear.  Patient presents with soft tissue irritation die to postural and biomechanical shoulder deficits.     OBJECTIVE IMPAIRMENTS: decreased coordination, decreased endurance, decreased knowledge of condition, decreased ROM, decreased strength, impaired UE functional use, improper body mechanics, postural dysfunction, and pain.   ACTIVITY LIMITATIONS: carrying, lifting, sleeping, and reach over head  PERSONAL  FACTORS: Age, Fitness, Past/current experiences, and Time since onset of injury/illness/exacerbation are also affecting patient's functional outcome.   REHAB POTENTIAL: Good  CLINICAL DECISION MAKING: Stable/uncomplicated  EVALUATION COMPLEXITY: Moderate   GOALS: Goals reviewed with patient? No  SHORT TERM GOALS: Target date: 06/09/2024    Patient to demonstrate independence in HEP  Baseline: [id number] Goal status: INITIAL  2.  Patient to reach [facility] and behind with minimal discomfort Baseline: marked discomfort and limited ROM Goal status: INITIAL    LONG  TERM GOALS: Target date: 07/07/2024    Patient will score at least 17/55 on FOTO to signify clinically meaningful improvement in functional abilities.   Baseline: 29/55 Goal status: INITIAL  2.  Patient will acknowledge 4/10 pain at least once during episode of care  Baseline: 7/10 Goal status: INITIAL  3.  Patient to demo AROM 160d flexion and abduction R shoulder Baseline:  A/PROM Right eval Left eval  Shoulder flexion 110/WNLd   Shoulder extension    Shoulder abduction 90/WNL   Shoulder adduction    Shoulder internal rotation GT/WNL   Shoulder external rotation /WNL    Goal status: INITIAL  4.  Patient to demonstrate 4/5 R shoulder and scapulothoracic strength  Baseline:  MMT Right eval Left eval  Shoulder flexion 3+   Shoulder extension 3+   Shoulder abduction 3+   Shoulder adduction    Shoulder internal rotation 4-   Shoulder external rotation 4-    Goal status: INITIAL   PLAN:  PT FREQUENCY: 2x/week  PT DURATION: 6 weeks  PLANNED INTERVENTIONS: 97110-Therapeutic exercises, 97530- Therapeutic activity, 97112- Neuromuscular re-education, 97535- Self Care, [id number]- Manual therapy, and Patient/Family education  PLAN FOR NEXT SESSION: HEP review and update, manual techniques as appropriate, aerobic tasks, ROM and flexibility activities, strengthening and PREs, TPDN, gait and balance training,aquatic  therapy, modalities for pain and NMRE     For all possible CPT codes, reference the Planned Interventions line above.     Check all conditions that are expected to impact treatment: {Conditions expected to impact treatment:None of these apply   If treatment provided at initial evaluation, no treatment charged due to lack of authorization.       Corean Pouch, PTA 05/21/2024, 3:26 PM   "

## 2024-05-25 ENCOUNTER — Ambulatory Visit

## 2024-05-25 DIAGNOSIS — G8929 Other chronic pain: Secondary | ICD-10-CM

## 2024-05-25 DIAGNOSIS — S46001A Unspecified injury of muscle(s) and tendon(s) of the rotator cuff of right shoulder, initial encounter: Secondary | ICD-10-CM

## 2024-05-25 DIAGNOSIS — M6281 Muscle weakness (generalized): Secondary | ICD-10-CM

## 2024-05-27 ENCOUNTER — Ambulatory Visit

## 2024-05-27 DIAGNOSIS — S46001A Unspecified injury of muscle(s) and tendon(s) of the rotator cuff of right shoulder, initial encounter: Secondary | ICD-10-CM | POA: Diagnosis not present

## 2024-05-27 DIAGNOSIS — G8929 Other chronic pain: Secondary | ICD-10-CM

## 2024-05-27 DIAGNOSIS — M6281 Muscle weakness (generalized): Secondary | ICD-10-CM

## 2024-05-27 NOTE — Therapy (Signed)
 " OUTPATIENT PHYSICAL THERAPY TREATMENT NOTE    Patient Name: [name] MRN: [id number] DOB:04/21/1961, 64 y.o., male Today's Date: 05/27/2024  END OF SESSION:  PT End of Session - 05/27/24 1215     Visit Number 4    Number of Visits 12    Date for Recertification  07/10/24    Authorization Type [employer]    Authorization Time Period 12 visits aproved 05/12/24-07/10/24    Authorization - Visit Number 4    Authorization - Number of Visits 12    PT Start Time 1215    PT Stop Time 1253    PT Time Calculation (min) 38 min    Activity Tolerance Patient tolerated treatment well    Behavior During Therapy WFL for tasks assessed/performed          Past Medical History:  Diagnosis Date   Alcohol abuse    Allergy    Anxiety    Arthritis    Diverticulosis of colon (without mention of hemorrhage)    GERD (gastroesophageal reflux disease)    Gout    Headache    History of kidney stones    Hyperlipemia    Hypertension    dr garrel  norins   Kidney stones    Nephrolithiasis    Pancreatitis    Hx of    Sleep apnea    possible sleep apnea--no testing done pt states, but his PCP has mentioned testing recently but not set up yet   Substance abuse (HCC)    alcohol   Ureteral stent displacement 1990   Past Surgical History:  Procedure Laterality Date   A2 pulley flexor sheath cyst 4th finger excisional biopsy     '03   ANTERIOR CRUCIATE LIGAMENT REPAIR     R knee   ESOPHAGOGASTRODUODENOSCOPY N/A 07/09/2012   Procedure: ESOPHAGOGASTRODUODENOSCOPY (EGD);  Surgeon: Lamar JONETTA Aho, MD;  Location: [facility];  Service: Endoscopy;  Laterality: N/A;   KNEE SURGERY  2008   LEFT HEART CATH AND CORONARY ANGIOGRAPHY N/A 08/10/2019   Procedure: LEFT HEART CATH AND CORONARY ANGIOGRAPHY;  Surgeon: [name], MD;  Location: [facility];  Service: Cardiovascular;  Laterality: N/A;   SHOULDER ARTHROSCOPY WITH SUBACROMIAL DECOMPRESSION AND OPEN ROTATOR C Right 12/26/2012   Procedure: RIGHT  SHOULDER ARTHROSCOPY WITH SUBACROMIAL DECOMPRESSION MINI OPEN ROTATOR CUFF REPAIR, OPEN BICEP TENODESIS OPEN DISTAL CLAVICLE RESECTION  ;  Surgeon: Elspeth JONELLE Her, MD;  Location: [facility];  Service: Orthopedics;  Laterality: Right;   TRANSFORAMINAL LUMBAR INTERBODY FUSION (TLIF) WITH PEDICLE SCREW FIXATION 1 LEVEL Right 03/27/2018   Procedure: RIGHT-SIDED LUMBAR 4-5 TRANSFORAMINAL LUMBAR INTERBODY FUSION WITH INSTRUMENTATION AND ALLOGRAFT;  Surgeon: Beuford Anes, MD;  Location: [facility];  Service: Orthopedics;  Laterality: Right;   URETEROSCOPY     and stone retreval   Patient Active Problem List   Diagnosis Date Noted   Ileus (HCC) 11/15/2023   Upper abdominal pain 11/13/2023   Morbid obesity (HCC) 11/05/2023   Chest pain of uncertain etiology 07/24/2019   Alcoholic pancreatitis 05/20/2018   Normal anion gap metabolic acidosis 05/20/2018   Radiculopathy 03/27/2018   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/02/2017   Renal stone 10/08/2016   Non-compliant behavior 12/02/2013   Melena 07/09/2012   Routine health maintenance 01/27/2012   Hyperlipidemia 01/27/2012   Knee pain, left 11/12/2011   BPH (benign prostatic hyperplasia) 11/12/2011   GERD 08/10/2008   Essential hypertension 04/14/2008   Alcohol abuse 10/26/2007   TRIGGER FINGER, LEFT MIDDLE 05/12/2007  NEPHROLITHIASIS, HX OF 05/12/2007    PCP: [name], NP   REFERRING PROVIDER: Emiliano Leonce LITTIE DEVONNA  REFERRING DIAG: [phone] (ICD-10-CM) - Injury of right rotator cuff, initial encounter  THERAPY DIAG:  Injury of right rotator cuff, initial encounter  Chronic right shoulder pain  Muscle weakness (generalized)  Rationale for Evaluation and Treatment: Rehabilitation  ONSET DATE: 9/3  SUBJECTIVE:                                                                                                                                                                                      SUBJECTIVE STATEMENT:   Patient  reports that he is still having shoulder pain, especially overnight when he sleeps on the RUE.  EVAL: Patient presents today for pain of his right shoulder.  He reports that the second injection performed on 9/3 has given him significant relief until recently.  He would like to avoid any more injections for the time being and would like to discuss possible physical therapy today.      [name] is a 64 y.o. male presents today with ongoing right shoulder pain after recent injury where he is throwing a trash bag into his trash bin and suddenly felt a pop with inability to lift the shoulder.  Of note he is 10 years status post previous right shoulder rotator cuff repair.  He was doing well until this occurred.  Since this time he has had very limited overhead range of motion of the shoulder.  He has been having pain with sleep with laying directly on the side.  This is his dominant side.  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 Hand dominance: Right  PERTINENT HISTORY: 64 y.o. male with history of a right shoulder rotator cuff repair who has subsequently developed glenohumeral osteoarthritis.  He did get significant relief from a cortisone injection performed approximately 3 months ago.  Dr. Genelle had briefly discussed potential shoulder arthroplasty but patient is still hoping to avoid any surgical intervention at this time as well as repeat injections.  Patient would like to trial physical therapy which I discussed seems very reasonable in order to help maximize his strength, range of motion, and function.  Will place referral for this today and can have him follow-up as needed.  PAIN:  Are you having pain? Yes: NPRS scale: 7/10 Pain location: R shoulder  Pain description: ache Aggravating factors: activity primarily OH and BH, lying on R side Relieving factors: rest, prior injections  PRECAUTIONS: None  RED FLAGS: None   WEIGHT BEARING RESTRICTIONS: No  FALLS:  Has patient  fallen in last 6 months? No  OCCUPATION: caregiver  PLOF: Independent  PATIENT GOALS:To manage my shoulder pain  NEXT MD VISIT: TBD  OBJECTIVE:  Note: Objective measures were completed at Evaluation unless otherwise noted.  DIAGNOSTIC FINDINGS:  IMPRESSION: 1. Current study is moderately motion degraded. 2. Postsurgical changes consistent with interval rotator cuff repair and distal clavicle resection. 3. The supraspinatus tendon is diffusely attenuated and irregular, consistent with recurrent at least partial thickness tearing. No significant tendon retraction or muscular atrophy. 4. The intra-articular portion of the biceps tendon is not visualized, possibly secondary to interval biceps tenotomy or tenodesis. 5. Diffuse irregularity and attenuation of the superior labrum which may relate to interval labral debridement or a tear. 6. Edema anteriorly and laterally in the deltoid muscle. 7. Moderate amount of complex fluid anteriorly in the subacromial-subdeltoid space.     Electronically Signed   By: Elsie Perone M.D.   On: 06/11/2023 12:16  PATIENT SURVEYS:  Quick Dash: 29/55 QUICK DASH    Minimally Clinically Important Difference (MCID): 15-20 points  (Franchignoni, F. et al. (2013). Minimally clinically important difference of the disabilities of the arm, shoulder, and hand outcome measures (DASH) and its shortened version (Quick DASH). Journal of Orthopaedic & Sports Physical Therapy, 44(1), 30-39)    POSTURE: Rounded and forward shoulder  UPPER EXTREMITY ROM:   A/PROM Right eval Left eval  Shoulder flexion 110/WNLd   Shoulder extension    Shoulder abduction 90/WNL   Shoulder adduction    Shoulder internal rotation GT/WNL   Shoulder external rotation /WNL   Elbow flexion    Elbow extension    Wrist flexion    Wrist extension    Wrist ulnar deviation    Wrist radial deviation    Wrist pronation    Wrist supination    (Blank rows = not  tested)  UPPER EXTREMITY MMT:  MMT Right eval Left eval  Shoulder flexion 3+   Shoulder extension 3+   Shoulder abduction 3+   Shoulder adduction    Shoulder internal rotation 4-   Shoulder external rotation 4-   Middle trapezius    Lower trapezius    Elbow flexion    Elbow extension    Wrist flexion    Wrist extension    Wrist ulnar deviation    Wrist radial deviation    Wrist pronation    Wrist supination    Grip strength (lbs)    (Blank rows = not tested)  SHOULDER SPECIAL TESTS: Impingement tests: Neer impingement test: positive  and Hawkins/Kennedy impingement test: positive  Rotator cuff assessment: Empty can test: negative and Hornblower's sign: negative   PALPATION:  TTP posterior scapular muscles                                                                                                                             TREATMENT:  [facility] Adult PT Treatment:  DATE: 05/27/24 Therapeutic Exercise: Pulleys flex/scap x2' ea Supine dowel flexion AAROM 2x10 Supine flexion AROM RUE 2x10 Supine chest press AROM RUE 2x10 Seated ITY's no weight x10 ea Neuromuscular re-ed: Supine horizontal abduction YTB x10, RTB x10 Supine BIL ER YTB 2x10 Supine press 500g 15x Supine flexion 500g 15x L S/L trio: ER/abd/flex 500g ball x15 ea  [facility] Adult PT Treatment:                                                DATE: 05/25/24 Therapeutic Exercise: Nustep L2 8 min Therapeutic Activity: Supine press 500g 15x Supine flexion 500g 15x L S/L flexion 500g 15x Prone flex/ext/scap/row/hor abd 500g ball 15x Seated hor abd YTB 15x Seated scaption 500g ball 15x Doorway stretch 30s x3 R pec minor release 2 min   [facility] Adult PT Treatment:                                                DATE: 05/21/24 Therapeutic Exercise: Pulleys flex/scap x2' ea Supine dowel flexion AAROM 2x10 Supine ER with dowel 2x10 Supine flexion AROM RUE 2x10 Supine  chest press AROM RUE 2x10 Seated ITY's no weight x10 ea Neuromuscular re-ed: Supine horizontal abduction YTB 2x10 Supine BIL ER YTB 2x10   PATIENT EDUCATION: Education details: Discussed eval findings, rehab rationale and POC and patient is in agreement  Person educated: Patient Education method: Explanation and Handouts Education comprehension: verbalized understanding and needs further education  HOME EXERCISE PROGRAM: Access Code: [email or web address] URL: [email or web address] Date: 05/12/2024 Prepared by: Jeffrey Ziemba  Exercises - Standing Shoulder Horizontal Abduction with Resistance  - 2-3 x daily - 5 x weekly - 1 sets - 15 reps - Shoulder External Rotation and Scapular Retraction with Resistance  - 2-3 x daily - 5 x weekly - 1 sets - 15 reps - Standing Shoulder Scaption  - 2-3 x daily - 5 x weekly - 1 sets - 15 reps  ASSESSMENT:  CLINICAL IMPRESSION: Patient presents to PT reporting continued shoulder pain but does note that it is overall improving. Session today continued to focus on RTC and periscapular strengthening and stabilization for the shoulder. Patient was able to tolerate all prescribed exercises with no adverse effects. Patient continues to benefit from skilled PT services and should be progressed as able to improve functional independence.   EVAL: Patient is a 64 y.o. male who was seen today for physical therapy evaluation and treatment for chronic R shoulder pain.  Patient presents with limited AROM due to pain, positive impingement signs and full PROM noted.  Forward head and protracted and rounded shoulder posture observed.  TTP noted to posterior scapular muscles especially infraspinatus and teres group.  Isolated RC testing unremarkable for tear.  Patient presents with soft tissue irritation die to postural and biomechanical shoulder deficits.     OBJECTIVE IMPAIRMENTS: decreased coordination, decreased endurance, decreased knowledge of condition,  decreased ROM, decreased strength, impaired UE functional use, improper body mechanics, postural dysfunction, and pain.   ACTIVITY LIMITATIONS: carrying, lifting, sleeping, and reach over head  PERSONAL FACTORS: Age, Fitness, Past/current experiences, and Time since onset of injury/illness/exacerbation are also affecting patient's functional outcome.   REHAB POTENTIAL: Good  CLINICAL DECISION MAKING: Stable/uncomplicated  EVALUATION  COMPLEXITY: Moderate   GOALS: Goals reviewed with patient? No  SHORT TERM GOALS: Target date: 06/09/2024    Patient to demonstrate independence in HEP  Baseline: [id number] Goal status: INITIAL  2.  Patient to reach [facility] and behind with minimal discomfort Baseline: marked discomfort and limited ROM Goal status: INITIAL   LONG TERM GOALS: Target date: 07/07/2024    Patient will score at least 17/55 on FOTO to signify clinically meaningful improvement in functional abilities.   Baseline: 29/55 Goal status: INITIAL  2.  Patient will acknowledge 4/10 pain at least once during episode of care  Baseline: 7/10 Goal status: INITIAL  3.  Patient to demo AROM 160d flexion and abduction R shoulder Baseline:  A/PROM Right eval Left eval  Shoulder flexion 110/WNLd   Shoulder extension    Shoulder abduction 90/WNL   Shoulder adduction    Shoulder internal rotation GT/WNL   Shoulder external rotation /WNL    Goal status: INITIAL  4.  Patient to demonstrate 4/5 R shoulder and scapulothoracic strength  Baseline:  MMT Right eval Left eval  Shoulder flexion 3+   Shoulder extension 3+   Shoulder abduction 3+   Shoulder adduction    Shoulder internal rotation 4-   Shoulder external rotation 4-    Goal status: INITIAL   PLAN:  PT FREQUENCY: 2x/week  PT DURATION: 6 weeks  PLANNED INTERVENTIONS: 97110-Therapeutic exercises, 97530- Therapeutic activity, 97112- Neuromuscular re-education, 97535- Self Care, 02859- Manual therapy, and Patient/Family  education  PLAN FOR NEXT SESSION: HEP review and update, manual techniques as appropriate, aerobic tasks, ROM and flexibility activities, strengthening and PREs, TPDN, gait and balance training,aquatic therapy, modalities for pain and NMRE     For all possible CPT codes, reference the Planned Interventions line above.     Check all conditions that are expected to impact treatment: {Conditions expected to impact treatment:None of these apply   If treatment provided at initial evaluation, no treatment charged due to lack of authorization.       Corean Pouch, PTA 05/27/2024, 12:55 PM   "

## 2024-05-28 ENCOUNTER — Telehealth (INDEPENDENT_AMBULATORY_CARE_PROVIDER_SITE_OTHER): Payer: Self-pay | Admitting: Primary Care

## 2024-05-28 NOTE — Telephone Encounter (Signed)
 Pt states that his back is in pain. He said he needed something to help with back pain. Pt stated that he would like a call back from CMA or provider on how to navigate with this.

## 2024-05-29 ENCOUNTER — Ambulatory Visit: Payer: Self-pay

## 2024-05-29 NOTE — Telephone Encounter (Signed)
 Will forward to provider

## 2024-05-29 NOTE — Telephone Encounter (Signed)
 FYI Only or Action Required?: Action required by provider: new medication request. Refused appointment. Requests follow up call from clinic today.  Patient was last seen in primary care on 03/03/2024 by [name], NP.  Called Nurse Triage reporting Back Pain.  Symptoms began a week ago.  Interventions attempted: OTC medications: Tylenol , Rest, and heat application.  Symptoms are: unchanged.  Triage Disposition: See PCP When Office is Open (Within 3 Days)  Patient/caregiver understands and will follow disposition?: No, wishes to speak with PCP   Reason for Disposition  [1] MODERATE back pain (e.g., interferes with normal activities) AND [2] present > 3 days  Answer Assessment - Initial Assessment Questions Patient called in requesting medication for back pain. He reports approximately one week ago he was helping son move and developed non radiating low back pain. He has been treating with rest, heat, and Tylenol  which only provides minimal temporary relief. Denies all other symptoms. He refused offered acute visit today due to no transportation, offered virtual visit which he also declines they are just going to ask me the same things you are asking. He is requesting for PCP to send in a prescription for pain relief. Please advise. Requests call back from clinic today.   1. ONSET: When did the pain begin? (e.g., minutes, hours, days)     Approximately one week 2. LOCATION: Where does it hurt? (upper, mid or lower back)     lower 3. SEVERITY: How bad is the pain?  (e.g., Scale 1-10; mild, moderate, or severe)     At the worst 8/10 4. PATTERN: Is the pain constant? (e.g., yes, no; constant, intermittent)      Constant  5. RADIATION: Does the pain shoot into your legs or somewhere else?     denies 6. CAUSE:  What do you think is causing the back pain?      Moving things 7. BACK OVERUSE:  Any recent lifting of heavy objects, strenuous work or exercise?      yes 8. MEDICINES: What have you taken so far for the pain? (e.g., nothing, acetaminophen , NSAIDS)     Tylenol  provides temporary relief 9. NEUROLOGIC SYMPTOMS: Do you have any weakness, numbness, or problems with bowel/bladder control?     denies 10. OTHER SYMPTOMS: Do you have any other symptoms? (e.g., fever, abdomen pain, burning with urination, blood in urine)       Fever  Protocols used: Back Pain-A-AH Message from [place] [facility] sent at 05/29/2024  2:30 PM EST  Reason for Triage: back pain

## 2024-06-01 ENCOUNTER — Ambulatory Visit

## 2024-06-02 ENCOUNTER — Telehealth (INDEPENDENT_AMBULATORY_CARE_PROVIDER_SITE_OTHER): Payer: Self-pay | Admitting: Primary Care

## 2024-06-02 NOTE — Telephone Encounter (Signed)
 Called to confirm appt. Pt will be present

## 2024-06-02 NOTE — Telephone Encounter (Signed)
 Called pt to confirm appt. Pt will be present.

## 2024-06-03 ENCOUNTER — Ambulatory Visit (INDEPENDENT_AMBULATORY_CARE_PROVIDER_SITE_OTHER): Admitting: Primary Care

## 2024-06-03 ENCOUNTER — Encounter (INDEPENDENT_AMBULATORY_CARE_PROVIDER_SITE_OTHER): Payer: Self-pay | Admitting: Primary Care

## 2024-06-03 VITALS — BP 123/77 | HR 66 | Temp 98.3°F | Resp 16 | Ht 72.0 in | Wt 268.8 lb

## 2024-06-03 DIAGNOSIS — H6122 Impacted cerumen, left ear: Secondary | ICD-10-CM

## 2024-06-03 DIAGNOSIS — G894 Chronic pain syndrome: Secondary | ICD-10-CM

## 2024-06-03 MED ORDER — NAPROXEN 500 MG PO TABS: 500.0000 mg | ORAL_TABLET | Freq: Two times a day (BID) | ORAL | 2 refills | Status: AC

## 2024-06-03 NOTE — Progress Notes (Unsigned)
 " [facility]  [name], is a 64 y.o. male  RDW:[id number]  FMW:[id number]  DOB - December 20, 1960  Chief Complaint  Patient presents with   Hypertension   Back Pain     one week ago he was helping son move and developed non radiating low back pain. He has been treating with rest, heat, and Tylenol  which only provides minimal temporary relief.       Subjective:   [name] is a 64 y.o. male here today for an acute visit. Patient is upset unable to get help for pain . Explained to him did not treat chronic pain other than Tylenol  or NSAIDS. Seen by ortho and in PT. Suggested contact orthopedics.   Hypertension  Back Pain    No problems updated.  Comprehensive ROS Pertinent positive and negative noted in HPI   Allergies[1]  Past Medical History:  Diagnosis Date   Alcohol abuse    Allergy    Anxiety    Arthritis    Diverticulosis of colon (without mention of hemorrhage)    GERD (gastroesophageal reflux disease)    Gout    Headache    History of kidney stones    Hyperlipemia    Hypertension    dr garrel  norins   Kidney stones    Nephrolithiasis    Pancreatitis    Hx of    Sleep apnea    possible sleep apnea--no testing done pt states, but his PCP has mentioned testing recently but not set up yet   Substance abuse (HCC)    alcohol   Ureteral stent displacement 1990    Medications Ordered Prior to Encounter[2] Health Maintenance  Topic Date Due   Zoster (Shingles) Vaccine (1 of 2) Never done   Pneumococcal Vaccine for age over 4 (1 of 1 - PCV) Never done   COVID-19 Vaccine (2 - Pfizer risk series) 02/19/2020   Flu Shot  08/04/2024*   Colon Cancer Screening  11/09/2031   DTaP/Tdap/Td vaccine (4 - Td or Tdap) 03/11/2033   HPV Vaccine (No Doses Required) Completed   Hepatitis C Screening  Completed   HIV Screening  Completed   Hepatitis B Vaccine  Aged Out   Meningitis B Vaccine  Aged Out  *Topic was postponed.  The date shown is not the original due date.    Objective:   Vitals:   06/03/24 1128  BP: 123/77  Pulse: 66  Resp: 16  Temp: 98.3 F (36.8 C)  SpO2: 97%  Weight: 268 lb 12.8 oz (121.9 kg)  Height: 6' (1.829 m)    Physical Exam Vitals reviewed.  Constitutional:      Appearance: He is obese.  HENT:     Head: Normocephalic.     Right Ear: Tympanic membrane and external ear normal.     Left Ear: External ear normal. There is impacted cerumen.     Nose: Nose normal.  Eyes:     Extraocular Movements: Extraocular movements intact.     Pupils: Pupils are equal, round, and reactive to light.  Cardiovascular:     Rate and Rhythm: Normal rate and regular rhythm.  Pulmonary:     Effort: Pulmonary effort is normal.     Breath sounds: Normal breath sounds.  Abdominal:     General: Bowel sounds are normal. There is distension.     Palpations: Abdomen is soft.  Musculoskeletal:        General: Normal range of motion.  Skin:    General: Skin  is warm and dry.  Neurological:     Mental Status: He is oriented to person, place, and time.  Psychiatric:        Mood and Affect: Mood normal.        Behavior: Behavior normal.        Thought Content: Thought content normal.        Judgment: Judgment normal.     {Labs (Optional):23779}  Assessment & Plan   There are no diagnoses linked to this encounter.   Patient have been counseled extensively about nutrition and exercise. Other issues discussed during this visit include: low cholesterol diet, weight control and daily exercise, foot care, annual eye examinations at Ophthalmology, importance of adherence with medications and regular follow-up. We also discussed long term complications of uncontrolled diabetes and hypertension.   No follow-ups on file.  The patient was given clear instructions to go to ER or return to medical center if symptoms don't improve, worsen or new problems develop. The patient verbalized understanding. The  patient was told to call to get lab results if they haven't heard anything in the next week.   This note has been created with Education officer, environmental. Any transcriptional errors are unintentional.   Rosaline SHAUNNA Bohr, NP 06/03/2024, 11:42 AM     [1] Allergies Allergen Reactions   Hydrocodone -Acetaminophen  Palpitations   Shrimp [Shellfish Allergy] Other (See Comments)    Cholesterol level becomes very high. Patient reports sweating and itching.  [2] Current Outpatient Medications on File Prior to Visit  Medication Sig Dispense Refill   amLODipine  (NORVASC ) 10 MG tablet Take 1 tablet (10 mg total) by mouth daily. 90 tablet 3   atorvastatin  (LIPITOR) 40 MG tablet Take 1 tablet (40 mg total) by mouth daily. 90 tablet 3   carvedilol  (COREG ) 25 MG tablet Take 1 tablet (25 mg total) by mouth 2 (two) times daily with a meal. 180 tablet 3   losartan  (COZAAR ) 25 MG tablet Take 1 tablet (25 mg total) by mouth daily. 90 tablet 3   Multiple Vitamins-Minerals (MULTIVITAMIN WITH MINERALS) tablet Take 1 tablet by mouth daily.     omeprazole  (PRILOSEC) 40 MG capsule TAKE 1 CAPSULE(40 MG) BY MOUTH DAILY 90 capsule 1   polyethylene glycol powder (GLYCOLAX /MIRALAX ) 17 GM/SCOOP powder Take 17 g by mouth daily. (Patient not taking: Reported on 03/04/2024) 238 g 0   polyethylene glycol powder (GLYCOLAX /MIRALAX ) 17 GM/SCOOP powder Take 17 g by mouth daily. (Patient not taking: Reported on 03/04/2024) 238 g 0   Tetrahydrozoline HCl (VISINE OP) Place 1 drop into both eyes daily as needed (redness).     traMADol  (ULTRAM ) 50 MG tablet Take 1 tablet (50 mg total) by mouth every 6 (six) hours as needed for up to 12 doses. (Patient not taking: Reported on 03/04/2024) 12 tablet 0   [DISCONTINUED] diphenhydrAMINE  (BENADRYL ) 25 MG tablet Take 1 tablet (25 mg total) by mouth every 6 (six) hours as needed. 30 tablet 0   No current facility-administered medications on  file prior to visit.  "

## 2024-06-04 ENCOUNTER — Ambulatory Visit

## 2024-06-05 ENCOUNTER — Ambulatory Visit

## 2024-06-05 DIAGNOSIS — G8929 Other chronic pain: Secondary | ICD-10-CM

## 2024-06-05 DIAGNOSIS — M6281 Muscle weakness (generalized): Secondary | ICD-10-CM

## 2024-06-05 DIAGNOSIS — S46001A Unspecified injury of muscle(s) and tendon(s) of the rotator cuff of right shoulder, initial encounter: Secondary | ICD-10-CM

## 2024-06-05 NOTE — Therapy (Unsigned)
 " OUTPATIENT PHYSICAL THERAPY TREATMENT NOTE    Patient Name: [name] MRN: [id number] DOB:Jan 24, 1961, 64 y.o., male Today's Date: 06/05/2024  END OF SESSION:    Past Medical History:  Diagnosis Date   Alcohol abuse    Allergy    Anxiety    Arthritis    Diverticulosis of colon (without mention of hemorrhage)    GERD (gastroesophageal reflux disease)    Gout    Headache    History of kidney stones    Hyperlipemia    Hypertension    dr garrel  norins   Kidney stones    Nephrolithiasis    Pancreatitis    Hx of    Sleep apnea    possible sleep apnea--no testing done pt states, but his PCP has mentioned testing recently but not set up yet   Substance abuse (HCC)    alcohol   Ureteral stent displacement 1990   Past Surgical History:  Procedure Laterality Date   A2 pulley flexor sheath cyst 4th finger excisional biopsy     '03   ANTERIOR CRUCIATE LIGAMENT REPAIR     R knee   ESOPHAGOGASTRODUODENOSCOPY N/A 07/09/2012   Procedure: ESOPHAGOGASTRODUODENOSCOPY (EGD);  Surgeon: Lamar JONETTA Aho, MD;  Location: [facility];  Service: Endoscopy;  Laterality: N/A;   KNEE SURGERY  2008   LEFT HEART CATH AND CORONARY ANGIOGRAPHY N/A 08/10/2019   Procedure: LEFT HEART CATH AND CORONARY ANGIOGRAPHY;  Surgeon: [name], MD;  Location: [facility];  Service: Cardiovascular;  Laterality: N/A;   SHOULDER ARTHROSCOPY WITH SUBACROMIAL DECOMPRESSION AND OPEN ROTATOR C Right 12/26/2012   Procedure: RIGHT SHOULDER ARTHROSCOPY WITH SUBACROMIAL DECOMPRESSION MINI OPEN ROTATOR CUFF REPAIR, OPEN BICEP TENODESIS OPEN DISTAL CLAVICLE RESECTION  ;  Surgeon: Elspeth JONELLE Her, MD;  Location: [facility];  Service: Orthopedics;  Laterality: Right;   TRANSFORAMINAL LUMBAR INTERBODY FUSION (TLIF) WITH PEDICLE SCREW FIXATION 1 LEVEL Right 03/27/2018   Procedure: RIGHT-SIDED LUMBAR 4-5 TRANSFORAMINAL LUMBAR INTERBODY FUSION WITH INSTRUMENTATION AND ALLOGRAFT;  Surgeon: Beuford Anes, MD;  Location: [facility];   Service: Orthopedics;  Laterality: Right;   URETEROSCOPY     and stone retreval   Patient Active Problem List   Diagnosis Date Noted   Ileus (HCC) 11/15/2023   Upper abdominal pain 11/13/2023   Morbid obesity (HCC) 11/05/2023   Chest pain of uncertain etiology 07/24/2019   Alcoholic pancreatitis 05/20/2018   Normal anion gap metabolic acidosis 05/20/2018   Radiculopathy 03/27/2018   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/02/2017   Renal stone 10/08/2016   Non-compliant behavior 12/02/2013   Melena 07/09/2012   Routine health maintenance 01/27/2012   Hyperlipidemia 01/27/2012   Knee pain, left 11/12/2011   BPH (benign prostatic hyperplasia) 11/12/2011   GERD 08/10/2008   Essential hypertension 04/14/2008   Alcohol abuse 10/26/2007   TRIGGER FINGER, LEFT MIDDLE 05/12/2007   NEPHROLITHIASIS, HX OF 05/12/2007    PCP: [name], NP   REFERRING PROVIDER: Emiliano Leonce CROME, PA-C  REFERRING DIAG: [phone] (ICD-10-CM) - Injury of right rotator cuff, initial encounter  THERAPY DIAG:  No diagnosis found.  Rationale for Evaluation and Treatment: Rehabilitation  ONSET DATE: 9/3  SUBJECTIVE:  SUBJECTIVE STATEMENT:   Patient reports that he is still having shoulder pain, especially overnight when he sleeps on the RUE.  EVAL: Patient presents today for pain of his right shoulder.  He reports that the second injection performed on 9/3 has given him significant relief until recently.  He would like to avoid any more injections for the time being and would like to discuss possible physical therapy today.      [name] is a 64 y.o. male presents today with ongoing right shoulder pain after recent injury where he is throwing a trash bag into his trash bin and suddenly felt a pop with inability to  lift the shoulder.  Of note he is 10 years status post previous right shoulder rotator cuff repair.  He was doing well until this occurred.  Since this time he has had very limited overhead range of motion of the shoulder.  He has been having pain with sleep with laying directly on the side.  This is his dominant side.  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 Hand dominance: Right  PERTINENT HISTORY: 64 y.o. male with history of a right shoulder rotator cuff repair who has subsequently developed glenohumeral osteoarthritis.  He did get significant relief from a cortisone injection performed approximately 3 months ago.  Dr. Genelle had briefly discussed potential shoulder arthroplasty but patient is still hoping to avoid any surgical intervention at this time as well as repeat injections.  Patient would like to trial physical therapy which I discussed seems very reasonable in order to help maximize his strength, range of motion, and function.  Will place referral for this today and can have him follow-up as needed.  PAIN:  Are you having pain? Yes: NPRS scale: 7/10 Pain location: R shoulder  Pain description: ache Aggravating factors: activity primarily OH and BH, lying on R side Relieving factors: rest, prior injections  PRECAUTIONS: None  RED FLAGS: None   WEIGHT BEARING RESTRICTIONS: No  FALLS:  Has patient fallen in last 6 months? No  OCCUPATION: caregiver  PLOF: Independent  PATIENT GOALS:To manage my shoulder pain  NEXT MD VISIT: TBD  OBJECTIVE:  Note: Objective measures were completed at Evaluation unless otherwise noted.  DIAGNOSTIC FINDINGS:  IMPRESSION: 1. Current study is moderately motion degraded. 2. Postsurgical changes consistent with interval rotator cuff repair and distal clavicle resection. 3. The supraspinatus tendon is diffusely attenuated and irregular, consistent with recurrent at least partial thickness tearing. No significant tendon retraction  or muscular atrophy. 4. The intra-articular portion of the biceps tendon is not visualized, possibly secondary to interval biceps tenotomy or tenodesis. 5. Diffuse irregularity and attenuation of the superior labrum which may relate to interval labral debridement or a tear. 6. Edema anteriorly and laterally in the deltoid muscle. 7. Moderate amount of complex fluid anteriorly in the subacromial-subdeltoid space.     Electronically Signed   By: Elsie Perone M.D.   On: 06/11/2023 12:16  PATIENT SURVEYS:  Quick Dash: 29/55 QUICK DASH    Minimally Clinically Important Difference (MCID): 15-20 points  (Franchignoni, F. et al. (2013). Minimally clinically important difference of the disabilities of the arm, shoulder, and hand outcome measures (DASH) and its shortened version (Quick DASH). Journal of Orthopaedic & Sports Physical Therapy, 44(1), 30-39)    POSTURE: Rounded and forward shoulder  UPPER EXTREMITY ROM:   A/PROM Right eval Left eval  Shoulder flexion 110/WNLd   Shoulder extension    Shoulder abduction 90/WNL   Shoulder adduction  Shoulder internal rotation GT/WNL   Shoulder external rotation /WNL   Elbow flexion    Elbow extension    Wrist flexion    Wrist extension    Wrist ulnar deviation    Wrist radial deviation    Wrist pronation    Wrist supination    (Blank rows = not tested)  UPPER EXTREMITY MMT:  MMT Right eval Left eval  Shoulder flexion 3+   Shoulder extension 3+   Shoulder abduction 3+   Shoulder adduction    Shoulder internal rotation 4-   Shoulder external rotation 4-   Middle trapezius    Lower trapezius    Elbow flexion    Elbow extension    Wrist flexion    Wrist extension    Wrist ulnar deviation    Wrist radial deviation    Wrist pronation    Wrist supination    Grip strength (lbs)    (Blank rows = not tested)  SHOULDER SPECIAL TESTS: Impingement tests: Neer impingement test: positive  and Hawkins/Kennedy  impingement test: positive  Rotator cuff assessment: Empty can test: negative and Hornblower's sign: negative   PALPATION:  TTP posterior scapular muscles                                                                                                                             TREATMENT:  [facility] Adult PT Treatment:                                                DATE: 05/27/24 Therapeutic Exercise: Pulleys flex/scap x2' ea Supine dowel flexion AAROM 2x10 Supine flexion AROM RUE 2x10 Supine chest press AROM RUE 2x10 Seated ITY's no weight x10 ea Neuromuscular re-ed: Supine horizontal abduction YTB x10, RTB x10 Supine BIL ER YTB 2x10 Supine press 500g 15x Supine flexion 500g 15x L S/L trio: ER/abd/flex 500g ball x15 ea  [facility] Adult PT Treatment:                                                DATE: 05/25/24 Therapeutic Exercise: Nustep L2 8 min Therapeutic Activity: Supine press 500g 15x Supine flexion 500g 15x L S/L flexion 500g 15x Prone flex/ext/scap/row/hor abd 500g ball 15x Seated hor abd YTB 15x Seated scaption 500g ball 15x Doorway stretch 30s x3 R pec minor release 2 min   [facility] Adult PT Treatment:                                                DATE: 05/21/24 Therapeutic Exercise: Pulleys flex/scap x2'  ea Supine dowel flexion AAROM 2x10 Supine ER with dowel 2x10 Supine flexion AROM RUE 2x10 Supine chest press AROM RUE 2x10 Seated ITY's no weight x10 ea Neuromuscular re-ed: Supine horizontal abduction YTB 2x10 Supine BIL ER YTB 2x10   PATIENT EDUCATION: Education details: Discussed eval findings, rehab rationale and POC and patient is in agreement  Person educated: Patient Education method: Explanation and Handouts Education comprehension: verbalized understanding and needs further education  HOME EXERCISE PROGRAM: Access Code: [email or web address] URL: [email or web address] Date: 05/12/2024 Prepared by: Shikita Vaillancourt  Exercises - Standing Shoulder  Horizontal Abduction with Resistance  - 2-3 x daily - 5 x weekly - 1 sets - 15 reps - Shoulder External Rotation and Scapular Retraction with Resistance  - 2-3 x daily - 5 x weekly - 1 sets - 15 reps - Standing Shoulder Scaption  - 2-3 x daily - 5 x weekly - 1 sets - 15 reps  ASSESSMENT:  CLINICAL IMPRESSION: Patient presents to PT reporting continued shoulder pain but does note that it is overall improving. Session today continued to focus on RTC and periscapular strengthening and stabilization for the shoulder. Patient was able to tolerate all prescribed exercises with no adverse effects. Patient continues to benefit from skilled PT services and should be progressed as able to improve functional independence.   EVAL: Patient is a 64 y.o. male who was seen today for physical therapy evaluation and treatment for chronic R shoulder pain.  Patient presents with limited AROM due to pain, positive impingement signs and full PROM noted.  Forward head and protracted and rounded shoulder posture observed.  TTP noted to posterior scapular muscles especially infraspinatus and teres group.  Isolated RC testing unremarkable for tear.  Patient presents with soft tissue irritation die to postural and biomechanical shoulder deficits.     OBJECTIVE IMPAIRMENTS: decreased coordination, decreased endurance, decreased knowledge of condition, decreased ROM, decreased strength, impaired UE functional use, improper body mechanics, postural dysfunction, and pain.   ACTIVITY LIMITATIONS: carrying, lifting, sleeping, and reach over head  PERSONAL FACTORS: Age, Fitness, Past/current experiences, and Time since onset of injury/illness/exacerbation are also affecting patient's functional outcome.   REHAB POTENTIAL: Good  CLINICAL DECISION MAKING: Stable/uncomplicated  EVALUATION COMPLEXITY: Moderate   GOALS: Goals reviewed with patient? No  SHORT TERM GOALS: Target date: 06/09/2024    Patient to demonstrate  independence in HEP  Baseline: [id number] Goal status: INITIAL  2.  Patient to reach [facility] and behind with minimal discomfort Baseline: marked discomfort and limited ROM Goal status: INITIAL   LONG TERM GOALS: Target date: 07/07/2024    Patient will score at least 17/55 on FOTO to signify clinically meaningful improvement in functional abilities.   Baseline: 29/55 Goal status: INITIAL  2.  Patient will acknowledge 4/10 pain at least once during episode of care  Baseline: 7/10 Goal status: INITIAL  3.  Patient to demo AROM 160d flexion and abduction R shoulder Baseline:  A/PROM Right eval Left eval  Shoulder flexion 110/WNLd   Shoulder extension    Shoulder abduction 90/WNL   Shoulder adduction    Shoulder internal rotation GT/WNL   Shoulder external rotation /WNL    Goal status: INITIAL  4.  Patient to demonstrate 4/5 R shoulder and scapulothoracic strength  Baseline:  MMT Right eval Left eval  Shoulder flexion 3+   Shoulder extension 3+   Shoulder abduction 3+   Shoulder adduction    Shoulder internal rotation 4-   Shoulder external rotation 4-    Goal status:  INITIAL   PLAN:  PT FREQUENCY: 2x/week  PT DURATION: 6 weeks  PLANNED INTERVENTIONS: 97110-Therapeutic exercises, 97530- Therapeutic activity, 97112- Neuromuscular re-education, 97535- Self Care, [id number]- Manual therapy, and Patient/Family education  PLAN FOR NEXT SESSION: HEP review and update, manual techniques as appropriate, aerobic tasks, ROM and flexibility activities, strengthening and PREs, TPDN, gait and balance training,aquatic therapy, modalities for pain and NMRE     For all possible CPT codes, reference the Planned Interventions line above.     Check all conditions that are expected to impact treatment: {Conditions expected to impact treatment:None of these apply   If treatment provided at initial evaluation, no treatment charged due to lack of authorization.       Thula Stewart M Hansini Clodfelter,  PT 06/05/2024, 11:06 AM   "

## 2024-06-08 ENCOUNTER — Ambulatory Visit

## 2024-06-10 ENCOUNTER — Ambulatory Visit

## 2024-06-10 DIAGNOSIS — G8929 Other chronic pain: Secondary | ICD-10-CM

## 2024-06-10 DIAGNOSIS — M6281 Muscle weakness (generalized): Secondary | ICD-10-CM

## 2024-06-10 DIAGNOSIS — S46001A Unspecified injury of muscle(s) and tendon(s) of the rotator cuff of right shoulder, initial encounter: Secondary | ICD-10-CM

## 2024-06-16 ENCOUNTER — Ambulatory Visit

## 2024-06-22 ENCOUNTER — Ambulatory Visit

## 2024-06-24 ENCOUNTER — Ambulatory Visit

## 2024-06-29 ENCOUNTER — Ambulatory Visit

## 2024-07-01 ENCOUNTER — Ambulatory Visit

## 2024-07-06 ENCOUNTER — Ambulatory Visit

## 2024-07-08 ENCOUNTER — Ambulatory Visit

## 2024-09-01 ENCOUNTER — Ambulatory Visit (INDEPENDENT_AMBULATORY_CARE_PROVIDER_SITE_OTHER): Payer: Self-pay | Admitting: Primary Care
